# Patient Record
Sex: Female | Born: 1935
Health system: Southern US, Community
[De-identification: ages and names within clinical notes are randomized; demographics above are authoritative.]

## PROBLEM LIST (undated history)

## (undated) DIAGNOSIS — R259 Unspecified abnormal involuntary movements: Secondary | ICD-10-CM

## (undated) DIAGNOSIS — H9190 Unspecified hearing loss, unspecified ear: Secondary | ICD-10-CM

## (undated) DIAGNOSIS — F0390 Unspecified dementia without behavioral disturbance: Secondary | ICD-10-CM

## (undated) DIAGNOSIS — H35342 Macular cyst, hole, or pseudohole, left eye: Secondary | ICD-10-CM

## (undated) DIAGNOSIS — M7989 Other specified soft tissue disorders: Secondary | ICD-10-CM

## (undated) DIAGNOSIS — K219 Gastro-esophageal reflux disease without esophagitis: Secondary | ICD-10-CM

## (undated) DIAGNOSIS — Q438 Other specified congenital malformations of intestine: Secondary | ICD-10-CM

## (undated) DIAGNOSIS — IMO0001 Reserved for inherently not codable concepts without codable children: Secondary | ICD-10-CM

## (undated) DIAGNOSIS — E538 Deficiency of other specified B group vitamins: Secondary | ICD-10-CM

## (undated) DIAGNOSIS — R251 Tremor, unspecified: Secondary | ICD-10-CM

## (undated) DIAGNOSIS — F411 Generalized anxiety disorder: Secondary | ICD-10-CM

## (undated) DIAGNOSIS — Z5189 Encounter for other specified aftercare: Secondary | ICD-10-CM

## (undated) DIAGNOSIS — I499 Cardiac arrhythmia, unspecified: Secondary | ICD-10-CM

## (undated) DIAGNOSIS — E7800 Pure hypercholesterolemia, unspecified: Secondary | ICD-10-CM

## (undated) DIAGNOSIS — K911 Postgastric surgery syndromes: Secondary | ICD-10-CM

## (undated) DIAGNOSIS — K222 Esophageal obstruction: Secondary | ICD-10-CM

## (undated) DIAGNOSIS — K5909 Other constipation: Secondary | ICD-10-CM

## (undated) DIAGNOSIS — M1990 Unspecified osteoarthritis, unspecified site: Secondary | ICD-10-CM

## (undated) DIAGNOSIS — K449 Diaphragmatic hernia without obstruction or gangrene: Secondary | ICD-10-CM

## (undated) DIAGNOSIS — N390 Urinary tract infection, site not specified: Secondary | ICD-10-CM

## (undated) DIAGNOSIS — I10 Essential (primary) hypertension: Secondary | ICD-10-CM

## (undated) DIAGNOSIS — I872 Venous insufficiency (chronic) (peripheral): Secondary | ICD-10-CM

## (undated) DIAGNOSIS — M159 Polyosteoarthritis, unspecified: Secondary | ICD-10-CM

## (undated) DIAGNOSIS — N736 Female pelvic peritoneal adhesions (postinfective): Secondary | ICD-10-CM

## (undated) DIAGNOSIS — D649 Anemia, unspecified: Secondary | ICD-10-CM

## (undated) HISTORY — DX: Generalized anxiety disorder: F41.1

## (undated) HISTORY — DX: Unspecified abnormal involuntary movements: R25.9

## (undated) HISTORY — DX: Unspecified dementia, unspecified severity, without behavioral disturbance, psychotic disturbance, mood disturbance, and anxiety: F03.90

## (undated) HISTORY — DX: Unspecified hearing loss, unspecified ear: H91.90

## (undated) HISTORY — DX: Other specified soft tissue disorders: M79.89

## (undated) HISTORY — DX: Postgastric surgery syndromes: K91.1

## (undated) HISTORY — DX: Deficiency of other specified B group vitamins: E53.8

## (undated) HISTORY — DX: Other constipation: K59.09

## (undated) HISTORY — DX: Venous insufficiency (chronic) (peripheral): I87.2

## (undated) HISTORY — DX: Essential (primary) hypertension: I10

## (undated) HISTORY — DX: Cardiac arrhythmia, unspecified: I49.9

## (undated) HISTORY — DX: Urinary tract infection, site not specified: N39.0

## (undated) HISTORY — DX: Polyosteoarthritis, unspecified: M15.9

## (undated) HISTORY — DX: Encounter for other specified aftercare: Z51.89

## (undated) HISTORY — PX: TOTAL ABDOMINAL HYSTERECTOMY: SHX209

## (undated) HISTORY — PX: COLONOSCOPY: SHX174

## (undated) HISTORY — PX: NISSEN FUNDOPLICATION: SHX2091

## (undated) HISTORY — DX: Anemia, unspecified: D64.9

## (undated) HISTORY — DX: Unspecified osteoarthritis, unspecified site: M19.90

## (undated) HISTORY — PX: UPPER GASTROINTESTINAL ENDOSCOPY: SHX188

## (undated) HISTORY — DX: Pure hypercholesterolemia, unspecified: E78.00

## (undated) HISTORY — DX: Macular cyst, hole, or pseudohole, left eye: H35.342

## (undated) HISTORY — DX: Other specified congenital malformations of intestine: Q43.8

## (undated) HISTORY — DX: Diaphragmatic hernia without obstruction or gangrene: K44.9

## (undated) HISTORY — PX: OTHER SURGICAL HISTORY: SHX169

## (undated) HISTORY — DX: Esophageal obstruction: K22.2

## (undated) HISTORY — DX: Female pelvic peritoneal adhesions (postinfective): N73.6

## (undated) HISTORY — DX: Tremor, unspecified: R25.1

## (undated) HISTORY — DX: Gastro-esophageal reflux disease without esophagitis: K21.9

## (undated) HISTORY — DX: Reserved for inherently not codable concepts without codable children: IMO0001

## (undated) HISTORY — PX: CHOLECYSTECTOMY: SHX55

---

## 1998-05-24 ENCOUNTER — Encounter: Payer: Self-pay | Admitting: Cardiology

## 1998-05-24 ENCOUNTER — Observation Stay (HOSPITAL_COMMUNITY): Admission: AD | Admit: 1998-05-24 | Discharge: 1998-05-25 | Payer: Self-pay | Admitting: Cardiology

## 1999-07-13 ENCOUNTER — Emergency Department (HOSPITAL_COMMUNITY): Admission: RE | Admit: 1999-07-13 | Discharge: 1999-07-13 | Payer: Self-pay | Admitting: Pulmonary Disease

## 1999-07-13 ENCOUNTER — Encounter: Payer: Self-pay | Admitting: Pulmonary Disease

## 1999-08-30 ENCOUNTER — Emergency Department (HOSPITAL_COMMUNITY): Admission: EM | Admit: 1999-08-30 | Discharge: 1999-08-30 | Payer: Self-pay | Admitting: Emergency Medicine

## 2000-11-17 ENCOUNTER — Other Ambulatory Visit: Admission: RE | Admit: 2000-11-17 | Discharge: 2000-11-17 | Payer: Self-pay | Admitting: Obstetrics and Gynecology

## 2001-02-18 HISTORY — PX: ESOPHAGUS SURGERY: SHX626

## 2002-05-27 ENCOUNTER — Ambulatory Visit (HOSPITAL_COMMUNITY): Admission: RE | Admit: 2002-05-27 | Discharge: 2002-05-27 | Payer: Self-pay | Admitting: Gastroenterology

## 2002-05-27 ENCOUNTER — Encounter: Payer: Self-pay | Admitting: Gastroenterology

## 2003-07-20 ENCOUNTER — Ambulatory Visit (HOSPITAL_COMMUNITY): Admission: RE | Admit: 2003-07-20 | Discharge: 2003-07-20 | Payer: Self-pay | Admitting: Pulmonary Disease

## 2003-08-02 ENCOUNTER — Ambulatory Visit (HOSPITAL_COMMUNITY): Admission: RE | Admit: 2003-08-02 | Discharge: 2003-08-02 | Payer: Self-pay | Admitting: Gastroenterology

## 2003-11-15 ENCOUNTER — Ambulatory Visit (HOSPITAL_COMMUNITY): Admission: RE | Admit: 2003-11-15 | Discharge: 2003-11-15 | Payer: Self-pay | Admitting: Gastroenterology

## 2004-01-23 ENCOUNTER — Encounter (INDEPENDENT_AMBULATORY_CARE_PROVIDER_SITE_OTHER): Payer: Self-pay | Admitting: Specialist

## 2004-01-23 ENCOUNTER — Observation Stay (HOSPITAL_COMMUNITY): Admission: RE | Admit: 2004-01-23 | Discharge: 2004-01-26 | Payer: Self-pay | Admitting: General Surgery

## 2004-01-31 ENCOUNTER — Ambulatory Visit: Payer: Self-pay | Admitting: Adult Health

## 2004-03-09 ENCOUNTER — Ambulatory Visit (HOSPITAL_COMMUNITY): Admission: RE | Admit: 2004-03-09 | Discharge: 2004-03-09 | Payer: Self-pay | Admitting: General Surgery

## 2004-03-15 ENCOUNTER — Ambulatory Visit: Payer: Self-pay | Admitting: Pulmonary Disease

## 2004-07-19 ENCOUNTER — Ambulatory Visit: Payer: Self-pay | Admitting: Pulmonary Disease

## 2004-08-15 ENCOUNTER — Ambulatory Visit: Payer: Self-pay | Admitting: Pulmonary Disease

## 2004-11-15 ENCOUNTER — Ambulatory Visit: Payer: Self-pay | Admitting: Pulmonary Disease

## 2004-11-30 ENCOUNTER — Ambulatory Visit: Payer: Self-pay | Admitting: Pulmonary Disease

## 2005-04-23 ENCOUNTER — Encounter: Admission: RE | Admit: 2005-04-23 | Discharge: 2005-04-23 | Payer: Self-pay | Admitting: General Surgery

## 2005-05-16 ENCOUNTER — Ambulatory Visit: Payer: Self-pay | Admitting: Pulmonary Disease

## 2005-06-10 ENCOUNTER — Ambulatory Visit (HOSPITAL_COMMUNITY): Admission: RE | Admit: 2005-06-10 | Discharge: 2005-06-12 | Payer: Self-pay | Admitting: General Surgery

## 2005-06-10 ENCOUNTER — Encounter (INDEPENDENT_AMBULATORY_CARE_PROVIDER_SITE_OTHER): Payer: Self-pay | Admitting: *Deleted

## 2005-11-14 ENCOUNTER — Ambulatory Visit: Payer: Self-pay | Admitting: Pulmonary Disease

## 2005-12-03 ENCOUNTER — Ambulatory Visit: Payer: Self-pay | Admitting: Pulmonary Disease

## 2006-01-23 ENCOUNTER — Ambulatory Visit: Payer: Self-pay | Admitting: Pulmonary Disease

## 2006-01-28 ENCOUNTER — Ambulatory Visit: Payer: Self-pay | Admitting: Gastroenterology

## 2006-03-10 ENCOUNTER — Ambulatory Visit: Payer: Self-pay | Admitting: Pulmonary Disease

## 2006-04-29 ENCOUNTER — Ambulatory Visit: Payer: Self-pay | Admitting: Gastroenterology

## 2006-05-13 ENCOUNTER — Ambulatory Visit: Payer: Self-pay | Admitting: Gastroenterology

## 2006-05-15 ENCOUNTER — Ambulatory Visit: Payer: Self-pay | Admitting: Pulmonary Disease

## 2006-05-15 LAB — CONVERTED CEMR LAB
Albumin: 3.5 g/dL (ref 3.5–5.2)
Alkaline Phosphatase: 88 units/L (ref 39–117)
BUN: 20 mg/dL (ref 6–23)
Basophils Relative: 0.8 % (ref 0.0–1.0)
CO2: 28 meq/L (ref 19–32)
Eosinophils Absolute: 0.2 10*3/uL (ref 0.0–0.6)
GFR calc Af Amer: 77 mL/min
GFR calc non Af Amer: 64 mL/min
Ketones, ur: NEGATIVE mg/dL
Lymphocytes Relative: 24.4 % (ref 12.0–46.0)
MCV: 63.7 fL — ABNORMAL LOW (ref 78.0–100.0)
Monocytes Relative: 9.3 % (ref 3.0–11.0)
Neutro Abs: 4.2 10*3/uL (ref 1.4–7.7)
Platelets: 503 10*3/uL — ABNORMAL HIGH (ref 150–400)
Potassium: 4.7 meq/L (ref 3.5–5.1)
RBC: 3.26 M/uL — ABNORMAL LOW (ref 3.87–5.11)
Saturation Ratios: 4.2 % — ABNORMAL LOW (ref 20.0–50.0)
Specific Gravity, Urine: 1.01 (ref 1.000–1.03)
Total Protein: 6.5 g/dL (ref 6.0–8.3)
Transferrin: 291.7 mg/dL (ref >=212.0)
Urine Glucose: NEGATIVE mg/dL
Urobilinogen, UA: 0.2 (ref 0.0–1.0)
pH: 7 (ref 5.0–8.0)

## 2006-05-16 ENCOUNTER — Ambulatory Visit (HOSPITAL_COMMUNITY): Admission: RE | Admit: 2006-05-16 | Discharge: 2006-05-16 | Payer: Self-pay | Admitting: Pulmonary Disease

## 2006-05-19 ENCOUNTER — Ambulatory Visit: Payer: Self-pay | Admitting: Gastroenterology

## 2006-05-20 ENCOUNTER — Ambulatory Visit (HOSPITAL_COMMUNITY): Admission: RE | Admit: 2006-05-20 | Discharge: 2006-05-20 | Payer: Self-pay | Admitting: Gastroenterology

## 2006-05-30 ENCOUNTER — Ambulatory Visit: Payer: Self-pay | Admitting: Pulmonary Disease

## 2006-05-30 LAB — CONVERTED CEMR LAB
Basophils Absolute: 0.1 10*3/uL (ref 0.0–0.1)
HCT: 26.6 % — ABNORMAL LOW (ref 36.0–46.0)
Iron: 35 ug/dL — ABNORMAL LOW (ref 42–145)
MCHC: 33.1 g/dL (ref 30.0–36.0)
Neutrophils Relative %: 61.9 % (ref 43.0–77.0)
RBC: 3.83 M/uL — ABNORMAL LOW (ref 3.87–5.11)
RDW: 23 % — ABNORMAL HIGH (ref 11.5–14.6)
Saturation Ratios: 9.4 % — ABNORMAL LOW (ref 20.0–50.0)

## 2006-06-05 ENCOUNTER — Ambulatory Visit: Payer: Self-pay | Admitting: Internal Medicine

## 2006-06-12 ENCOUNTER — Ambulatory Visit: Payer: Self-pay | Admitting: Pulmonary Disease

## 2006-06-19 ENCOUNTER — Ambulatory Visit: Payer: Self-pay | Admitting: Pulmonary Disease

## 2006-06-19 LAB — CONVERTED CEMR LAB
Basophils Relative: 0.9 % (ref 0.0–1.0)
Eosinophils Relative: 2.4 % (ref 0.0–5.0)
Lymphocytes Relative: 28.7 % (ref 12.0–46.0)
Platelets: 328 10*3/uL (ref 150–400)
RBC: 4.15 M/uL (ref 3.87–5.11)
RDW: 28.3 % — ABNORMAL HIGH (ref 11.5–14.6)
Saturation Ratios: 8.8 % — ABNORMAL LOW (ref 20.0–50.0)
Transferrin: 252.1 mg/dL (ref >=212.0)
WBC: 5.3 10*3/uL (ref 4.5–10.5)

## 2006-07-24 ENCOUNTER — Ambulatory Visit: Payer: Self-pay | Admitting: Pulmonary Disease

## 2006-07-24 LAB — CONVERTED CEMR LAB
Basophils Absolute: 0.1 K/uL
Basophils Relative: 0.9 %
Eosinophils Absolute: 0.2 K/uL
Eosinophils Relative: 2.5 %
HCT: 33.1 % — ABNORMAL LOW
Hemoglobin: 11.7 g/dL — ABNORMAL LOW
Iron: 112 ug/dL
Lymphocytes Relative: 24.6 %
MCHC: 35.2 g/dL
MCV: 79.4 fL
Monocytes Absolute: 0.5 K/uL
Monocytes Relative: 9.1 %
Neutro Abs: 3.7 K/uL
Neutrophils Relative %: 62.9 %
Platelets: 321 K/uL
RBC: 4.17 M/uL
RDW: 27.4 % — ABNORMAL HIGH
Saturation Ratios: 33.3 %
Transferrin: 240.3 mg/dL
WBC: 6 10*3/microliter

## 2006-09-10 ENCOUNTER — Ambulatory Visit: Payer: Self-pay | Admitting: Pulmonary Disease

## 2006-10-21 ENCOUNTER — Ambulatory Visit: Payer: Self-pay | Admitting: Pulmonary Disease

## 2006-10-21 LAB — CONVERTED CEMR LAB
ALT: 21 units/L (ref 0–35)
AST: 23 units/L (ref 0–37)
Basophils Relative: 0.9 % (ref 0.0–1.0)
Bilirubin, Direct: 0.1 mg/dL (ref 0.0–0.3)
CO2: 32 meq/L (ref 19–32)
Chloride: 101 meq/L (ref 96–112)
Eosinophils Absolute: 0.1 10*3/uL (ref 0.0–0.6)
Eosinophils Relative: 1.5 % (ref 0.0–5.0)
GFR calc non Af Amer: 86 mL/min
Glucose, Bld: 94 mg/dL (ref 70–99)
HCT: 35.2 % — ABNORMAL LOW (ref 36.0–46.0)
Iron: 49 ug/dL (ref 42–145)
Lymphocytes Relative: 23.2 % (ref 12.0–46.0)
MCV: 91.5 fL (ref 78.0–100.0)
Neutrophils Relative %: 66.1 % (ref 43.0–77.0)
RBC: 3.84 M/uL — ABNORMAL LOW (ref 3.87–5.11)
Sodium: 140 meq/L (ref 135–145)
TSH: 1.89 microintl units/mL (ref 0.35–5.50)
Total Bilirubin: 0.7 mg/dL (ref 0.3–1.2)
Total Protein: 6.7 g/dL (ref 6.0–8.3)
WBC: 7.2 10*3/uL (ref 4.5–10.5)

## 2006-11-13 ENCOUNTER — Ambulatory Visit: Payer: Self-pay | Admitting: Pulmonary Disease

## 2006-12-18 DIAGNOSIS — K449 Diaphragmatic hernia without obstruction or gangrene: Secondary | ICD-10-CM | POA: Insufficient documentation

## 2006-12-18 DIAGNOSIS — D649 Anemia, unspecified: Secondary | ICD-10-CM | POA: Insufficient documentation

## 2006-12-18 DIAGNOSIS — M159 Polyosteoarthritis, unspecified: Secondary | ICD-10-CM | POA: Insufficient documentation

## 2006-12-18 DIAGNOSIS — F411 Generalized anxiety disorder: Secondary | ICD-10-CM | POA: Insufficient documentation

## 2007-02-23 ENCOUNTER — Telehealth (INDEPENDENT_AMBULATORY_CARE_PROVIDER_SITE_OTHER): Payer: Self-pay | Admitting: *Deleted

## 2007-05-14 ENCOUNTER — Ambulatory Visit: Payer: Self-pay | Admitting: Pulmonary Disease

## 2007-05-14 ENCOUNTER — Ambulatory Visit: Payer: Self-pay | Admitting: Internal Medicine

## 2007-05-14 DIAGNOSIS — I10 Essential (primary) hypertension: Secondary | ICD-10-CM | POA: Insufficient documentation

## 2007-05-14 DIAGNOSIS — N390 Urinary tract infection, site not specified: Secondary | ICD-10-CM

## 2007-05-14 DIAGNOSIS — I499 Cardiac arrhythmia, unspecified: Secondary | ICD-10-CM

## 2007-05-14 DIAGNOSIS — K5909 Other constipation: Secondary | ICD-10-CM

## 2007-05-14 DIAGNOSIS — M810 Age-related osteoporosis without current pathological fracture: Secondary | ICD-10-CM

## 2007-05-14 DIAGNOSIS — G250 Essential tremor: Secondary | ICD-10-CM | POA: Insufficient documentation

## 2007-06-04 ENCOUNTER — Encounter: Payer: Self-pay | Admitting: Pulmonary Disease

## 2007-11-12 ENCOUNTER — Ambulatory Visit: Payer: Self-pay | Admitting: Pulmonary Disease

## 2007-11-14 DIAGNOSIS — I872 Venous insufficiency (chronic) (peripheral): Secondary | ICD-10-CM

## 2008-02-23 ENCOUNTER — Telehealth (INDEPENDENT_AMBULATORY_CARE_PROVIDER_SITE_OTHER): Payer: Self-pay | Admitting: *Deleted

## 2008-03-04 ENCOUNTER — Telehealth (INDEPENDENT_AMBULATORY_CARE_PROVIDER_SITE_OTHER): Payer: Self-pay | Admitting: *Deleted

## 2008-04-13 ENCOUNTER — Telehealth: Payer: Self-pay | Admitting: Pulmonary Disease

## 2008-05-12 ENCOUNTER — Ambulatory Visit: Payer: Self-pay | Admitting: Pulmonary Disease

## 2008-05-14 LAB — CONVERTED CEMR LAB
AST: 24 units/L (ref 0–37)
Albumin: 3.6 g/dL (ref 3.5–5.2)
Alkaline Phosphatase: 79 units/L (ref 39–117)
Basophils Absolute: 0.1 10*3/uL (ref 0.0–0.1)
Basophils Relative: 0.8 % (ref 0.0–3.0)
CO2: 29 meq/L (ref 19–32)
Calcium: 9.3 mg/dL (ref 8.4–10.5)
GFR calc non Af Amer: 101.62 mL/min (ref >60)
Glucose, Bld: 102 mg/dL — ABNORMAL HIGH (ref 70–99)
HCT: 37.6 % (ref 36.0–46.0)
Hemoglobin: 13.1 g/dL (ref 12.0–15.0)
Lymphocytes Relative: 19.5 % (ref 12.0–46.0)
Lymphs Abs: 1.4 10*3/uL (ref 0.7–4.0)
MCHC: 34.7 g/dL (ref 30.0–36.0)
Monocytes Relative: 8.3 % (ref 3.0–12.0)
Neutro Abs: 5.2 10*3/uL (ref 1.4–7.7)
Potassium: 3.9 meq/L (ref 3.5–5.1)
RBC: 3.91 M/uL (ref 3.87–5.11)
RDW: 12.5 % (ref 11.5–14.6)
Sodium: 140 meq/L (ref 135–145)
TSH: 1.24 microintl units/mL (ref 0.35–5.50)
Total CHOL/HDL Ratio: 4
Total Protein: 6.6 g/dL (ref 6.0–8.3)
Triglycerides: 116 mg/dL (ref 0.0–149.0)

## 2008-05-18 ENCOUNTER — Telehealth (INDEPENDENT_AMBULATORY_CARE_PROVIDER_SITE_OTHER): Payer: Self-pay | Admitting: *Deleted

## 2008-11-17 ENCOUNTER — Ambulatory Visit: Payer: Self-pay | Admitting: Pulmonary Disease

## 2008-11-18 ENCOUNTER — Encounter: Payer: Self-pay | Admitting: Pulmonary Disease

## 2008-11-19 DIAGNOSIS — E7800 Pure hypercholesterolemia, unspecified: Secondary | ICD-10-CM | POA: Insufficient documentation

## 2009-03-22 ENCOUNTER — Telehealth (INDEPENDENT_AMBULATORY_CARE_PROVIDER_SITE_OTHER): Payer: Self-pay | Admitting: *Deleted

## 2009-05-18 ENCOUNTER — Ambulatory Visit: Payer: Self-pay | Admitting: Pulmonary Disease

## 2009-05-19 ENCOUNTER — Ambulatory Visit: Payer: Self-pay | Admitting: Pulmonary Disease

## 2009-05-20 LAB — CONVERTED CEMR LAB
Alkaline Phosphatase: 88 units/L (ref 39–117)
BUN: 14 mg/dL (ref 6–23)
Basophils Relative: 0.6 % (ref 0.0–3.0)
Bilirubin, Direct: 0.1 mg/dL (ref 0.0–0.3)
CO2: 32 meq/L (ref 19–32)
Chloride: 103 meq/L (ref 96–112)
Eosinophils Absolute: 0.2 10*3/uL (ref 0.0–0.7)
Glucose, Bld: 86 mg/dL (ref 70–99)
HCT: 37.9 % (ref 36.0–46.0)
HDL: 48 mg/dL (ref >39.00)
Hemoglobin: 13.2 g/dL (ref 12.0–15.0)
Lymphocytes Relative: 24.6 % (ref 12.0–46.0)
Lymphs Abs: 1.5 10*3/uL (ref 0.7–4.0)
MCHC: 34.9 g/dL (ref 30.0–36.0)
MCV: 97.7 fL (ref 78.0–100.0)
Neutro Abs: 3.9 10*3/uL (ref 1.4–7.7)
Potassium: 3.7 meq/L (ref 3.5–5.1)
RBC: 3.88 M/uL (ref 3.87–5.11)
Sodium: 143 meq/L (ref 135–145)
Total Bilirubin: 0.6 mg/dL (ref 0.3–1.2)
Total CHOL/HDL Ratio: 4
Total Protein: 6.8 g/dL (ref 6.0–8.3)
VLDL: 23.4 mg/dL (ref 0.0–40.0)
Vit D, 25-Hydroxy: 24 ng/mL — ABNORMAL LOW (ref 30–89)

## 2009-07-21 ENCOUNTER — Ambulatory Visit: Payer: Self-pay | Admitting: Internal Medicine

## 2009-07-21 ENCOUNTER — Encounter: Payer: Self-pay | Admitting: Pulmonary Disease

## 2009-11-15 ENCOUNTER — Ambulatory Visit: Payer: Self-pay | Admitting: Pulmonary Disease

## 2009-11-24 ENCOUNTER — Encounter: Payer: Self-pay | Admitting: Pulmonary Disease

## 2010-01-17 ENCOUNTER — Encounter: Payer: Self-pay | Admitting: Pulmonary Disease

## 2010-01-18 ENCOUNTER — Encounter: Admission: RE | Admit: 2010-01-18 | Discharge: 2010-01-18 | Payer: Self-pay | Admitting: Orthopedic Surgery

## 2010-01-26 ENCOUNTER — Telehealth: Payer: Self-pay | Admitting: Pulmonary Disease

## 2010-01-29 ENCOUNTER — Telehealth: Payer: Self-pay | Admitting: Pulmonary Disease

## 2010-03-18 LAB — CONVERTED CEMR LAB
Basophils Absolute: 0.1 10*3/uL (ref 0.0–0.1)
Eosinophils Absolute: 0.1 10*3/uL (ref 0.0–0.6)
HCT: 38.2 % (ref 36.0–46.0)
Hemoglobin: 12.9 g/dL (ref 12.0–15.0)
Iron: 80 ug/dL (ref 42–145)
MCHC: 33.7 g/dL (ref 30.0–36.0)
MCV: 94.1 fL (ref 78.0–100.0)
Monocytes Absolute: 0.6 10*3/uL (ref 0.2–0.7)
Neutro Abs: 4 10*3/uL (ref 1.4–7.7)
Platelets: 312 10*3/uL (ref 150–400)
RDW: 12.6 % (ref 11.5–14.6)
Transferrin: 200.3 mg/dL — ABNORMAL LOW (ref >=212.0)

## 2010-03-22 NOTE — Progress Notes (Signed)
Summary: boniva changedt o alendronate  Phone Note Call from Patient Call back at Home Phone [phone]   Caller: Patient Call For: nadel Summary of Call: Pt wants to know if alendronate sodium is replacing another of her meds.//[facility] Initial call taken by: Darletta Moll,  January 29, 2010 9:28 AM  Follow-up for Phone Call        advised pt that it is taking place of the boniva. pt states understanding. Carron Curie CMA  January 29, 2010 11:22 AM

## 2010-03-22 NOTE — Progress Notes (Signed)
Summary: rx  Phone Note Call from Patient Call back at Home Phone [phone]   Caller: Patient Call For: nadel Reason for Call: Refill Medication Summary of Call: pt needs refill on [place] called in to [facility] Initial call taken by: Eugene Gavia,  March 22, 2009 3:25 PM  Follow-up for Phone Call        called spoke with patient, informed her that her med has been sent to her pharmacy of choice.  pt verbalized her understanding. Boone Master CNA  March 22, 2009 3:44 PM     Prescriptions: FERROUS SULFATE 324 MG TBEC (FERROUS SULFATE) take 1 tab by mouth once daily...  #30 x 6   Entered by:   Boone Master CNA   Authorized by:   Michele Mcalpine MD   Signed by:   Boone Master CNA on 03/22/2009   Method used:   Electronically to        [facility] (retail)       [address]       [place], [place]  [address]       Ph: [phone]       Fax: [phone]   RxID:   [id number]

## 2010-03-22 NOTE — Assessment & Plan Note (Signed)
Summary: 6 months/apc   CC:  6 month ROV & review of mult medical problems....  History of Present Illness: 77 y/o WF here for a follow up visit... she has mult med problems as noted below... 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 she states that she has been doing well without any new complaints or concerns...   ~  November 17, 2008:  she's had a stable 78mo but had some hip & knee pain- saw DrMortenson w/ shot given...  "I use linament"... OK Flu shot today.   ~  May 18, 2009:  continues to do well... didn't bring meds to visit today- reviewed w/ pt, same no change... BP controlled on meds; Lipids remain elevated on diet alone but she declines statin Rx;  otherw stable...    Current Problem List:  HYPERTENSION (ICD-401.9) - controlled on TOPROL XL 50- 1/2 tab daily, HYZAAR 50/d, and DILTIAZEM 180mg /d... BP = 128/70 and she is doing well, takes meds regularly and tol well... denies HA, visual changes, CP, palipit, dizziness, syncope, dyspnea, edema, etc...  Hx of CARDIAC ARRHYTHMIA (ICD-427.9) - hx VTach in past, it was exercise induced... eval by DrBBrodie prev- normal cath... stable on meds... last seen 9/05 for pre-op clearance...  ~  NuclearStressTest 9/05 showed no scar or ischemia, EF=70%, some mild ectopy...  VENOUS INSUFFICIENCY (ICD-459.81) - she has mild intermittent edema- treated w/ low sodium diet, elevation, support hose...  HYPERCHOLESTEROLEMIA, MILD (ICD-272.0) - on diet alone...  ~  FLP 3/10 showed TChol 204, TG 116, HDL 46, LDL 134... she prefers diet Rx.  ~  FLP 4/11 showed TChol 206, TG 117, HDL 48, LDL 142... offered meds, she prefers diet.  HIATAL HERNIA (ICD-553.3) - on OMEPRAZOLE 20mg Bid... doing well w/ intermittent reflux symptoms, but no dysphagia, etc... she is s/p nissen fundoplication for a large HH...  ~  last EGD 3/08 showed prev HH surg, mild  stricture, some gastritis...  CONSTIPATION, CHRONIC (ICD-564.09) - on Miralax, Senakot-S...  ~  last colonoscopy 9/00 showed marked tortuous redundant colon, ? adhesions...   Hx of URINARY TRACT INFECTION (ICD-599.0) - she has some bladder symptoms- going q2-3H during the night, but is apparently OK during the day... she refuses urology referral & doesn't want medication trial...  DEGENERATIVE JOINT DISEASE, GENERALIZED (ICD-715.00) - on TRAMADOL 50mg  Prn (using about 2/d)... LBP eval by DrMortenson w/ shot in her knee, she says...   OSTEOPOROSIS (ICD-733.00) - prev on Fosamax but pt DC'd this w/ prev GIB... BMD 3/09 showed TScores -1.6 in Spine, -2.4 in [facility]... started BONIVA 150mg /mo and tolerating this satis- just c/o $$$ in the donut hole... also rec to take CALTRATE Bid and MVI...  ~  labs 3/10 showed Vit D = 28... rec OTC Vit D supplement 1000 u daily.  ~  labs 3/11 showed Vit D = 24... rec incr to 2000 u vit D daily...  TREMOR (ICD-781.0) - she has head titubations on PRIMADONE 50mg Tid...  ANXIETY (ICD-300.00)  ANEMIA (ICD-285.9) - she had an Fe defic anemia in 2008 w/ GI eval by DrGessner- neg EGD, neg colon, capsule endoscopy without lesions seen... she takes REPLIVA daily, but insurance won't pay- therefore change to OTC Fe supplement...   ~  labs 3/09 showed Hg= 12.9, and FE= 80...  ~  labs 3/10 showed Hg= 13.1, Fe= 52... rec> OTC FeSO4 supplement...  ~  labs 3/11 showed Hg=  13.2   Allergies (verified): No Known Drug Allergies  Comments:  Nurse/Medical Assistant: The patient's medications and allergies were reviewed with the patient and were updated in the Medication and Allergy Lists.  Past History:  Past Medical History:  HYPERTENSION (ICD-401.9) Hx of CARDIAC ARRHYTHMIA (ICD-427.9) VENOUS INSUFFICIENCY (ICD-459.81) HYPERCHOLESTEROLEMIA, MILD (ICD-272.0) HIATAL HERNIA (ICD-553.3) CONSTIPATION, CHRONIC (ICD-564.09) Hx of URINARY TRACT INFECTION  (ICD-599.0) DEGENERATIVE JOINT DISEASE, GENERALIZED (ICD-715.00) OSTEOPOROSIS (ICD-733.00) TREMOR (ICD-781.0) ANXIETY (ICD-300.00) ANEMIA (ICD-285.9)  Past Surgical History: S/P nissen fundoplication for large HH S/P cholecystectomy  Family History: Reviewed history from 11/12/2007 and no changes required. Father died at age 27 from unknown causes Mother died age 47 w/ cancer  4 Siblings: 1 died age 84, 3 others a/w  Social History: Reviewed history from 11/12/2007 and no changes required. Widow 3 Children Never smoker No Etoh  Review of Systems      See HPI       The patient complains of decreased hearing, dyspnea on exertion, and difficulty walking.  The patient denies anorexia, fever, weight loss, weight gain, vision loss, hoarseness, chest pain, syncope, peripheral edema, prolonged cough, headaches, hemoptysis, abdominal pain, melena, hematochezia, severe indigestion/heartburn, hematuria, incontinence, muscle weakness, suspicious skin lesions, transient blindness, depression, unusual weight change, abnormal bleeding, enlarged lymph nodes, and angioedema.    Vital Signs:  Patient profile:   77 year old female Height:      57 inches Weight:      152.25 pounds O2 Sat:      98 % on Room air Temp:     97.3 degrees F oral Pulse rate:   68 / minute BP sitting:   128 / 70  (left arm) Cuff size:   regular  Vitals Entered By: Randell Loop CMA (May 18, 2009 9:43 AM)  O2 Sat at Rest %:  98 O2 Flow:  Room air CC: 6 month ROV & review of mult medical problems... Is Patient Diabetic? No Pain Assessment Patient in pain? no      Comments no changes in meds today   Physical Exam  Additional Exam:  WD, WN, 77 y/o WF in NAD... GENERAL:  Alert & oriented; pleasant & cooperative... HEENT:  [place]/AT, EOM-wnl, PERRLA, EACs-clear, TMs-wnl, NOSE-clear, THROAT-clear & wnl. NECK:  Supple w/ fairROM; no JVD; normal carotid impulses w/o bruits; no thyromegaly or nodules palpated; no  lymphadenopathy. CHEST:  Clear to P & A; without wheezes/ rales/ or rhonchi. HEART:  Regular Rhythm; without murmurs/ rubs/ or gallops. ABDOMEN:  Soft & nontender; normal bowel sounds; no organomegaly or masses detected. EXT: without deformities, mod arthritic changes; no varicose veins/ +venous insuffic/ no edema. NEURO:  CN's intact;  no focal neuro deficits... DERM:  No lesions noted; no rash etc...    MISC. Report  Procedure date:  05/19/2009  Findings:      BMP (METABOL)   Sodium                    143 mEq/L                   135-145   Potassium                 3.7 mEq/L                   3.5-5.1   Chloride                  103 mEq/L  96-112   Carbon Dioxide            32 mEq/L                    19-32   Glucose                   86 mg/dL                    16-10   BUN                       14 mg/dL                    9-60   Creatinine                0.7 mg/dL                   4.5-4.0   Calcium                   9.3 mg/dL                   9.8-11.9   GFR                       84.84 mL/min                >60   Lipid Panel (LIPID)   Cholesterol          [H]  206 mg/dL                   1-478   Triglycerides             117.0 mg/dL                 2.9-562.1   HDL                       30.86 mg/dL                 >57.84 Cholesterol LDL - Direct                             142.0 mg/dL   Hepatic/Liver Function Panel (HEPATIC)   Total Bilirubin           0.6 mg/dL                   6.9-6.2   Direct Bilirubin          0.1 mg/dL                   9.5-2.8   Alkaline Phosphatase      88 U/L                      39-117   AST                       21 U/L                      0-37   ALT                       18 U/L  0-35   Total Protein             6.8 g/dL                    5.9-5.6   Albumin                   3.8 g/dL                    3.8-7.5  Comments:      CBC Platelet w/Diff (CBCD)   White Cell Count          6.0 K/uL                     4.5-10.5   Red Cell Count            3.88 Mil/uL                 3.87-5.11   Hemoglobin                13.2 g/dL                   64.3-32.9   Hematocrit                37.9 %                      36.0-46.0   MCV                       97.7 fl                     78.0-100.0   Platelet Count            286.0 K/uL                  150.0-400.0   Neutrophil %              64.3 %                      43.0-77.0   Lymphocyte %              24.6 %                      12.0-46.0   Monocyte %                7.5 %                       3.0-12.0   Eosinophils%              3.0 %                       0.0-5.0   Basophils %               0.6 %                       0.0-3.0  TSH (TSH)   FastTSH                   1.20 uIU/mL                 0.35-5.50  Vitamin D (25-Hydroxy) (51884)  Vitamin D (25-Hydroxy)                        [  L]  24 ng/mL                    30-89   Impression & Recommendations:  Problem # 1:  HYPERTENSION (ICD-401.9) Controlled-  same meds. Her updated medication list for this problem includes:    Toprol Xl 50 Mg Tb24 (Metoprolol succinate) .Marland Kitchen... Take one half of a tablet by mouth once daily    Diltiazem Hcl Cr 180 Mg Cp24 (Diltiazem hcl) .Marland Kitchen... Take 1 capsule by mouth once a day    Hyzaar 50-12.5 Mg Tabs (Losartan potassium-hctz) .Marland Kitchen... Take 1 tablet by mouth once a day  Problem # 2:  HYPERCHOLESTEROLEMIA, MILD (ICD-272.0) She is a candidate for Statin rx-  but declines and wants to continue diet alone...  Problem # 3:  HIATAL HERNIA (ICD-553.3) GI is stable-  continue current meds... Her updated medication list for this problem includes:    Omeprazole 20 Mg Tbec (Omeprazole) .Marland Kitchen... Take 1 tab by mouth two times a day - 30 min before breakfast and dinner...  Problem # 4:  DEGENERATIVE JOINT DISEASE, GENERALIZED (ICD-715.00) She will continue to remain active & Rx w/ Tramadol, Linament... Her updated medication list for this problem includes:    Tramadol Hcl 50 Mg Tabs  (Tramadol hcl) .Marland Kitchen... Take 1 tab by mouth three times a day as needed for pain...  Problem # 5:  OSTEOPOROSIS (ICD-733.00) Continue Boniva, Calcium, Vit D... Her updated medication list for this problem includes:    Boniva 150 Mg Tabs (Ibandronate sodium) .Marland Kitchen... Take one tablet by mouth every month  Problem # 6:  ANEMIA (ICD-285.9) Stable Hg... Her updated medication list for this problem includes:    Ferrous Sulfate 324 Mg Tbec (Ferrous sulfate) .Marland Kitchen... Take 1 tab by mouth once daily...  Problem # 7:  OTHER MEDICAL PROBLEMS AS NOTED>>>  Complete Medication List: 1)  Toprol Xl 50 Mg Tb24 (Metoprolol succinate) .... Take one half of a tablet by mouth once daily 2)  Diltiazem Hcl Cr 180 Mg Cp24 (Diltiazem hcl) .... Take 1 capsule by mouth once a day 3)  Hyzaar 50-12.5 Mg Tabs (Losartan potassium-hctz) .... Take 1 tablet by mouth once a day 4)  Omeprazole 20 Mg Tbec (Omeprazole) .... Take 1 tab by mouth two times a day - 30 min before breakfast and dinner... 5)  Boniva 150 Mg Tabs (Ibandronate sodium) .... Take one tablet by mouth every month 6)  Calcium Carbonate-vitamin D 600-400 Mg-unit Tabs (Calcium carbonate-vitamin d) .... Take 1 tablet by mouth once a day 7)  Multivitamins Tabs (Multiple vitamin) .Marland Kitchen.. 1 tab daily.Marland KitchenMarland Kitchen 8)  Vitamin D3 400 Unit Tabs (Cholecalciferol) .... Take 1 tablet by mouth once a day 9)  Tramadol Hcl 50 Mg Tabs (Tramadol hcl) .... Take 1 tab by mouth three times a day as needed for pain... 10)  Primidone 50 Mg Tabs (Primidone) .... Take 3 tablets by mouth once daily 11)  Ferrous Sulfate 324 Mg Tbec (Ferrous sulfate) .... Take 1 tab by mouth once daily...  Other Orders: Prescription Created Electronically [id number])  Patient Instructions: 1)  Today we updated your med list- see below.... 2)  We refilled the meds you requested...  3)  Please return to our lab in the AM for your FASTING blood work... 4)  please call the "phone tree" in a few days for your lab  results.Marland KitchenMarland Kitchen 5)  Stay as active as you are...  6)  Call for any problems.Marland KitchenMarland Kitchen 7)  Please schedule a follow-up appointment in  6 months. Prescriptions: PRIMIDONE 50 MG  TABS (PRIMIDONE) Take 3 tablets by mouth once daily  #90 x prn   Entered and Authorized by:   Michele Mcalpine MD   Signed by:   Michele Mcalpine MD on 05/18/2009   Method used:   Print then Give to Patient   RxID:   [id number] TRAMADOL HCL 50 MG TABS (TRAMADOL HCL) take 1 tab by mouth three times a day as needed for pain...  #90 x prn   Entered and Authorized by:   Michele Mcalpine MD   Signed by:   Michele Mcalpine MD on 05/18/2009   Method used:   Print then Give to Patient   RxID:   [id number]

## 2010-03-22 NOTE — Assessment & Plan Note (Signed)
Summary: 6 months/apc   CC:  6 month ROV & review of mult medical problems....  History of Present Illness: 77 y/o WF here for a follow up visit... she has mult med problems as noted below... 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    ~  November 17, 2008:  she's had a stable 78mo but had some hip & knee pain- saw DrMortenson w/ shot given...  "I use linament"... OK Flu shot today.   ~  May 18, 2009:  continues to do well... didn't bring meds to visit today- reviewed w/ pt, same no change... BP controlled on meds; Lipids remain elevated on diet alone but she declines statin Rx;  otherw stable...   ~  November 15, 2009:  overall doing well & still working... she notes "growth" on neck near suprasternal notch- exam shows prom sternoclavic heads bilat, sl tender, & ?cyst in soft tissue (thyroid is normal)... she also notes a sore in her right nares that continues to bother her & we discussed ENT referral to get this checked out... BP remains controlled, denies CP/ palpit/ ch in SOB/ etc;  she continues on the Boniva, calcium, MVI, & Vit D... takes Tramadol for pain & Primadone for tremor... she brought meds today & reviewed w/ pt- OK refills & Flu shot today.    Current Problem List:  HYPERTENSION (ICD-401.9) - controlled on TOPROL XL 50- 1/2 tab daily, HYZAAR 50/d, and DILTIAZEM 180mg /d... BP = 124/66 and she is doing well, takes meds regularly and tol well... denies HA, visual changes, CP, palipit, dizziness, syncope, dyspnea, edema, etc...  Hx of CARDIAC ARRHYTHMIA (ICD-427.9) - hx VTach in past, it was exercise induced... eval by DrBBrodie prev- normal cath... stable on meds... last seen 9/05 for pre-op clearance...  ~  NuclearStressTest 9/05 showed no scar or ischemia, EF=70%, some mild ectopy...  VENOUS INSUFFICIENCY (ICD-459.81) - she has mild intermittent edema- treated w/ low  sodium diet, elevation, support hose...  HYPERCHOLESTEROLEMIA, MILD (ICD-272.0) - on diet alone...  ~  FLP 3/10 showed TChol 204, TG 116, HDL 46, LDL 134... she prefers diet Rx.  ~  FLP 4/11 showed TChol 206, TG 117, HDL 48, LDL 142... offered meds, she prefers diet.  HIATAL HERNIA (ICD-553.3) - on OMEPRAZOLE 20mg Bid... doing well w/ intermittent reflux symptoms, but no dysphagia, etc... she is s/p nissen fundoplication for a large HH...  ~  last EGD 3/08 showed prev HH surg, mild stricture, some gastritis...  CONSTIPATION, CHRONIC (ICD-564.09) - on Miralax, & Senakot-S Prn for constip...  ~  last colonoscopy 9/00 showed marked tortuous redundant colon, ? adhesions...   Hx of URINARY TRACT INFECTION (ICD-599.0) - she has some bladder symptoms- going q2-3H during the night, but is apparently OK during the day... she refuses urology referral & doesn't want medication trial...  DEGENERATIVE JOINT DISEASE, GENERALIZED (ICD-715.00) - on TRAMADOL 50mg  Prn (using about 2/d)... LBP eval by DrMortenson w/ shot in her knee, she says...   OSTEOPOROSIS (ICD-733.00) - prev on Fosamax but pt DC'd this w/ prev GIB... BMD 3/09 showed TScores -1.6 in Spine, -2.4 in [facility]... started BONIVA 150mg /mo and tolerating this satis- just c/o $$$ in the donut hole... also rec to take CALTRATE Bid and MVI...  ~  labs 3/10 showed Vit D = 28... rec OTC Vit D supplement 1000 u daily.  ~  labs 3/11 showed Vit D =  24... rec incr to 2000 u vit D daily...  TREMOR (ICD-781.0) - she has head titubations on PRIMADONE 50mg Tid...  ANXIETY (ICD-300.00)  ANEMIA (ICD-285.9) - she had an Fe defic anemia in 2008 w/ GI eval by DrGessner- neg EGD, neg colon, capsule endoscopy without lesions seen... she takes REPLIVA daily, but insurance won't pay- therefore change to OTC Fe supplement...   ~  labs 3/09 showed Hg= 12.9, and FE= 80...  ~  labs 3/10 showed Hg= 13.1, Fe= 52... rec> OTC FeSO4 supplement...  ~  labs 3/11 showed Hg=  13.2   Preventive Screening-Counseling & Management  Alcohol-Tobacco     Smoking Status: never  Allergies (verified): No Known Drug Allergies  Comments:  Nurse/Medical Assistant: The patient's medications and allergies were reviewed with the patient and were updated in the Medication and Allergy Lists.  Past History:  Past Medical History: HYPERTENSION (ICD-401.9) Hx of CARDIAC ARRHYTHMIA (ICD-427.9) VENOUS INSUFFICIENCY (ICD-459.81) HYPERCHOLESTEROLEMIA, MILD (ICD-272.0) HIATAL HERNIA (ICD-553.3) CONSTIPATION, CHRONIC (ICD-564.09) Hx of URINARY TRACT INFECTION (ICD-599.0) DEGENERATIVE JOINT DISEASE, GENERALIZED (ICD-715.00) OSTEOPOROSIS (ICD-733.00) TREMOR (ICD-781.0) ANXIETY (ICD-300.00) ANEMIA (ICD-285.9)  Past Surgical History: S/P nissen fundoplication for large HH S/P cholecystectomy  Family History: Reviewed history from 11/12/2007 and no changes required. Father died at age 14 from unknown causes Mother died age 48 w/ cancer  4 Siblings: 1 died age 54, 3 others a/w  Social History: Reviewed history from 11/12/2007 and no changes required. Widow 3 Children Never smoker No Etoh  Review of Systems      See HPI       The patient complains of decreased hearing and dyspnea on exertion.  The patient denies anorexia, fever, weight loss, weight gain, vision loss, hoarseness, chest pain, syncope, peripheral edema, prolonged cough, headaches, hemoptysis, abdominal pain, melena, hematochezia, severe indigestion/heartburn, hematuria, incontinence, muscle weakness, suspicious skin lesions, transient blindness, difficulty walking, depression, unusual weight change, abnormal bleeding, enlarged lymph nodes, and angioedema.    Vital Signs:  Patient profile:   77 year old female Height:      57 inches Weight:      150 pounds BMI:     32.58 O2 Sat:      94 % on Room air Temp:     97.7 degrees F oral Pulse rate:   67 / minute BP sitting:   124 / 66  (right arm) Cuff  size:   regular  Vitals Entered By: Randell Loop CMA (November 15, 2009 2:26 PM)  O2 Sat at Rest %:  94 O2 Flow:  Room air CC: 6 month ROV & review of mult medical problems... Is Patient Diabetic? No Pain Assessment Patient in pain? yes      Onset of pain  sometimes with her hand pain Comments meds updated today with pt---she brought all of her meds in today   Physical Exam  Additional Exam:  WD, WN, 77 y/o WF in NAD... GENERAL:  Alert & oriented; pleasant & cooperative... HEENT:  [place]/AT, EOM-wnl, PERRLA, EACs-clear, TMs-wnl, NOSE-clear, THROAT-clear & wnl. NECK:  Supple w/ fairROM; no JVD; normal carotid impulses w/o bruits; no thyromegaly or nodules palpated; no lymphadenopathy. CHEST:  Clear to P & A; without wheezes/ rales/ or rhonchi. HEART:  Regular Rhythm; without murmurs/ rubs/ or gallops. ABDOMEN:  Soft & nontender; normal bowel sounds; no organomegaly or masses detected. EXT: without deformities, mod arthritic changes; no varicose veins/ +venous insuffic/ no edema. NEURO:  CN's intact;  no focal neuro deficits... DERM:  No lesions noted; no rash etc..Marland Kitchen  Impression & Recommendations:  Problem # 1:  HYPERTENSION (ICD-401.9) Controlled>  same meds. Her updated medication list for this problem includes:    Toprol Xl 50 Mg Tb24 (Metoprolol succinate) .Marland Kitchen... Take one half of a tablet by mouth once daily    Diltiazem Hcl Cr 180 Mg Cp24 (Diltiazem hcl) .Marland Kitchen... Take 1 capsule by mouth once a day    Hyzaar 50-12.5 Mg Tabs (Losartan potassium-hctz) .Marland Kitchen... Take 1 tablet by mouth once a day  Problem # 2:  Hx of CARDIAC ARRHYTHMIA (ICD-427.9) She denies CP, palpit, ch in SOB, edema, etc... Her updated medication list for this problem includes:    Toprol Xl 50 Mg Tb24 (Metoprolol succinate) .Marland Kitchen... Take one half of a tablet by mouth once daily  Problem # 3:  HYPERCHOLESTEROLEMIA, MILD (ICD-272.0) Stable on low chol/ low fat diet...  Problem # 4:  CONSTIPATION, CHRONIC  (ICD-564.09) GI is stable on Omep & Prn laxatives...  Problem # 5:  DEGENERATIVE JOINT DISEASE, GENERALIZED (ICD-715.00) Stable on Tramadol... Her updated medication list for this problem includes:    Tramadol Hcl 50 Mg Tabs (Tramadol hcl) .Marland Kitchen... Take 1 tab by mouth three times a day as needed for pain...  Problem # 6:  OSTEOPOROSIS (ICD-733.00) BMD 6/11 showed sl worsening TScores> rec to continue Boniva, calcium, Vits, VitD, & wt bearing exercise. Her updated medication list for this problem includes:    Boniva 150 Mg Tabs (Ibandronate sodium) .Marland Kitchen... Take one tablet by mouth every month  Problem # 7:  TREMOR (ICD-781.0) She has some titubations and Mysoline helps...  Problem # 8:  ANXIETY (ICD-300.00) She does not want anxiolytic therapy...  Complete Medication List: 1)  Toprol Xl 50 Mg Tb24 (Metoprolol succinate) .... Take one half of a tablet by mouth once daily 2)  Diltiazem Hcl Cr 180 Mg Cp24 (Diltiazem hcl) .... Take 1 capsule by mouth once a day 3)  Hyzaar 50-12.5 Mg Tabs (Losartan potassium-hctz) .... Take 1 tablet by mouth once a day 4)  Omeprazole 20 Mg Tbec (Omeprazole) .... Take 1 tab by mouth two times a day - 30 min before breakfast and dinner... 5)  Boniva 150 Mg Tabs (Ibandronate sodium) .... Take one tablet by mouth every month 6)  Calcium Carbonate-vitamin D 600-400 Mg-unit Tabs (Calcium carbonate-vitamin d) .... Take 1 tablet by mouth once a day 7)  Multivitamins Tabs (Multiple vitamin) .Marland Kitchen.. 1 tab daily.Marland KitchenMarland Kitchen 8)  Vitamin D3 400 Unit Tabs (Cholecalciferol) .... Take 1 tablet by mouth once a day 9)  Tramadol Hcl 50 Mg Tabs (Tramadol hcl) .... Take 1 tab by mouth three times a day as needed for pain... 10)  Primidone 50 Mg Tabs (Primidone) .... Take 3 tablets by mouth once daily 11)  Ferrous Sulfate 324 Mg Tbec (Ferrous sulfate) .... Take 1 tab by mouth once daily... 12)  Ocuvite Preservision Tabs (Multiple vitamins-minerals) .... Take 2 tablets by mouth once daily  Other  Orders: Flu Vaccine 22yrs + MEDICARE PATIENTS (Z6109) Administration Flu vaccine - MCR (U0454)  Patient Instructions: 1)  Today we updated your med list- see below.... 2)  We refilled the meds you requested... 3)  Today we gave you the 2011 Flu vaccine... 4)  Try warm soaks & the Tramadol pain pill for any arthritic complaints. 5)  Call for any questions.Marland KitchenMarland Kitchen 6)  Please schedule a follow-up appointment in 6 months, w/ fasting blood work at that time. Prescriptions: OMEPRAZOLE 20 MG TBEC (OMEPRAZOLE) take 1 tab by mouth two times a day -  30 min before breakfast and dinner...  #180 x 4   Entered and Authorized by:   Michele Mcalpine MD   Signed by:   Michele Mcalpine MD on 11/15/2009   Method used:   Print then Give to Patient   RxID:   [id number] HYZAAR 50-12.5 MG  TABS (LOSARTAN POTASSIUM-HCTZ) Take 1 tablet by mouth once a day  #90 x 4   Entered and Authorized by:   Michele Mcalpine MD   Signed by:   Michele Mcalpine MD on 11/15/2009   Method used:   Print then Give to Patient   RxID:   [id number] TOPROL XL 50 MG  TB24 (METOPROLOL SUCCINATE) Take one half of a tablet by mouth once daily  #45 x 4   Entered and Authorized by:   Michele Mcalpine MD   Signed by:   Michele Mcalpine MD on 11/15/2009   Method used:   Print then Give to Patient   RxID:   [id number]   Flu Vaccine Consent Questions     Do you have a history of severe allergic reactions to this vaccine? no    Any prior history of allergic reactions to egg and/or gelatin? no    Do you have a sensitivity to the preservative Thimersol? no    Do you have a past history of Guillan-Barre Syndrome? no    Do you currently have an acute febrile illness? no    Have you ever had a severe reaction to latex? no    Vaccine information given and explained to patient? yes    Are you currently pregnant? no    Lot Number:[id number]   Exp Date:08/18/2010   Site Given  Left Deltoid IMedflu   Randell Loop [facility]  November 15, 2009 3:40 PM

## 2010-03-22 NOTE — Miscellaneous (Signed)
Summary: BONE DENSITY  Clinical Lists Changes  Orders: Added new Test order of T-Bone Densitometry (77080) - Signed Added new Test order of T-Lumbar Vertebral Assessment (77082) - Signed 

## 2010-03-22 NOTE — Progress Notes (Signed)
Summary: returning call  Phone Note Call from Patient Call back at Home Phone [phone]   Caller: Patient Call For: nadel Summary of Call: Retutning phone call. Initial call taken by: Darletta Moll,  January 26, 2010 11:04 AM  Follow-up for Phone Call        Marliss Czar, did you call this pt? Pls advise and I will be happy to relay a msg, thanks! Vernie Murders  January 26, 2010 11:08 AM   Additional Follow-up for Phone Call Additional follow up Details #1::        called pt back about the insurance will not cover the boniva in 2012---SN recs to change to alendronate 70/weekly..Hiliary Osorto is ok with this change but she stated that she wanted SN to know that she saw Dr. Priscille Kluver 2 wks ago due to lower back pain and down into her leg--had mri done and they told her that the nerves in the spine are curved due to arthritis and she will have injections starting on tuesday for this.  SN is aware of this.    New/Updated Medications: ALENDRONATE SODIUM 70 MG TABS (ALENDRONATE SODIUM) take one tablet by mouth every week Prescriptions: ALENDRONATE SODIUM 70 MG TABS (ALENDRONATE SODIUM) take one tablet by mouth every week  #4 x 11   Entered by:   Randell Loop CMA   Authorized by:   Michele Mcalpine MD   Signed by:   Randell Loop CMA on 01/26/2010   Method used:   Electronically to        [facility] (retail)       [address]       [place], [place]  [address]       Ph: [phone]       Fax: [phone]   RxID:   [id number]

## 2010-03-22 NOTE — Medication Information (Signed)
Summary: Boniva/[facility]  [facility]   Imported By: Sherian Rein 01/31/2010 13:16:36  _____________________________________________________________________  External Attachment:    Type:   Image     Comment:   External Document

## 2010-05-15 ENCOUNTER — Encounter: Payer: Self-pay | Admitting: Pulmonary Disease

## 2010-05-18 ENCOUNTER — Ambulatory Visit (INDEPENDENT_AMBULATORY_CARE_PROVIDER_SITE_OTHER): Payer: Medicare Other | Admitting: Pulmonary Disease

## 2010-05-18 ENCOUNTER — Other Ambulatory Visit (INDEPENDENT_AMBULATORY_CARE_PROVIDER_SITE_OTHER): Payer: Medicare Other | Admitting: Pulmonary Disease

## 2010-05-18 ENCOUNTER — Encounter: Payer: Self-pay | Admitting: Pulmonary Disease

## 2010-05-18 ENCOUNTER — Other Ambulatory Visit: Payer: Self-pay | Admitting: Pulmonary Disease

## 2010-05-18 ENCOUNTER — Other Ambulatory Visit (INDEPENDENT_AMBULATORY_CARE_PROVIDER_SITE_OTHER): Payer: Medicare Other

## 2010-05-18 VITALS — BP 110/60 | HR 66 | Temp 97.6°F | Ht <= 58 in | Wt 147.4 lb

## 2010-05-18 DIAGNOSIS — E7800 Pure hypercholesterolemia, unspecified: Secondary | ICD-10-CM

## 2010-05-18 DIAGNOSIS — R5383 Other fatigue: Secondary | ICD-10-CM

## 2010-05-18 DIAGNOSIS — R259 Unspecified abnormal involuntary movements: Secondary | ICD-10-CM

## 2010-05-18 DIAGNOSIS — M810 Age-related osteoporosis without current pathological fracture: Secondary | ICD-10-CM

## 2010-05-18 DIAGNOSIS — I10 Essential (primary) hypertension: Secondary | ICD-10-CM

## 2010-05-18 DIAGNOSIS — D649 Anemia, unspecified: Secondary | ICD-10-CM

## 2010-05-18 DIAGNOSIS — R5381 Other malaise: Secondary | ICD-10-CM

## 2010-05-18 DIAGNOSIS — R531 Weakness: Secondary | ICD-10-CM

## 2010-05-18 DIAGNOSIS — N390 Urinary tract infection, site not specified: Secondary | ICD-10-CM

## 2010-05-18 DIAGNOSIS — K449 Diaphragmatic hernia without obstruction or gangrene: Secondary | ICD-10-CM

## 2010-05-18 DIAGNOSIS — M159 Polyosteoarthritis, unspecified: Secondary | ICD-10-CM

## 2010-05-18 LAB — BASIC METABOLIC PANEL
Chloride: 101 mEq/L (ref 96–112)
Potassium: 4.3 mEq/L (ref 3.5–5.1)
Sodium: 140 mEq/L (ref 135–145)

## 2010-05-18 LAB — CBC WITH DIFFERENTIAL/PLATELET
Basophils Relative: 0.4 % (ref 0.0–3.0)
Eosinophils Absolute: 0.1 10*3/uL (ref 0.0–0.7)
HCT: 36.5 % (ref 36.0–46.0)
Lymphs Abs: 1.5 10*3/uL (ref 0.7–4.0)
MCHC: 35.1 g/dL (ref 30.0–36.0)
MCV: 99 fl (ref 78.0–100.0)
Monocytes Absolute: 0.4 10*3/uL (ref 0.1–1.0)
Neutrophils Relative %: 67.1 % (ref 43.0–77.0)
Platelets: 281 10*3/uL (ref 150.0–400.0)
RBC: 3.69 Mil/uL — ABNORMAL LOW (ref 3.87–5.11)

## 2010-05-18 LAB — LIPID PANEL
Total CHOL/HDL Ratio: 4
VLDL: 24.2 mg/dL (ref 0.0–40.0)

## 2010-05-18 LAB — HEPATIC FUNCTION PANEL
ALT: 19 U/L (ref 0–35)
AST: 25 U/L (ref 0–37)
Bilirubin, Direct: 0.1 mg/dL (ref 0.0–0.3)
Total Bilirubin: 0.6 mg/dL (ref 0.3–1.2)

## 2010-05-18 NOTE — Patient Instructions (Signed)
Today we updated your med list...    Continue your current medications the same...  Today we did your follow up FASTING blood work...    Please call the PHONE TREE in a few days for your results:    Dial [phone] & when prompted enter your pt number followed by the # symbol>    Your pt number= [phone]  Stay as active as poss & consider following up w/ DrRendall regarding a physical therapy program. Call for any questions... Let's plan a follow up visit in 6 month, sooner if needed.Marland KitchenMarland Kitchen

## 2010-05-18 NOTE — Progress Notes (Signed)
Subjective:    Patient ID: [name], female    DOB: [birth year], 77 y.o.   MRN: [id number]  HPI 77 y/o WF here for a follow up visit... she has mult med problems including:  HBP;  Cardiac arrhythmia;  VV/ Ven IOnsuffic;  Hyperchol;  HH- s/p nissen/ Constip;  UTIs;  DJD;  Osteoporosis;  Tremor;  Anxiety;  Hx of anemia...  ~  November 15, 2009:  overall doing well & still working... she notes "growth" on neck near suprasternal notch- exam shows prom sternoclavic heads bilat, sl tender, & ?cyst in soft tissue (thyroid is normal)... she also notes a sore in her right nares that continues to bother her & we discussed ENT referral to get this checked out... BP remains controlled, denies CP/ palpit/ ch in SOB/ etc;  she continues on the Boniva, calcium, MVI, & Vit D... takes Tramadol for pain & Primadone for tremor... she brought meds today & reviewed w/ pt- OK refills & Flu shot today.  ~  May 18, 2010:  6 month ROV & she tells me that she had to quit work due to her knees & LBP;  She saw DrRendall for Ortho & had MRI "it's worse than I thought" & no surg option per DrRendall, takes Tramadol & Tylenol;  she had shots from [facility] & he gave her Methocarbamol as needed- slightly better she says...    HBP>  On Metoprolol, Diltiazem & Hyzaar;  BP= 110/60, taking meds regularly & tol well;  Denies CP, palpit, dizzy, SOB, edema, etc...    Chol>  On diet alone;  FLP shows TChol 238, TG 121, HDL 55, LDL 158; she has repeatedly refused med Rx...    GI>  Hx HH s/p nissen & constip on Omep, Miralax, Senakot-S;  Doing well w/o heartburn, abd pain, swelling, n/v, change in BMs etc...    Osteop>  On Alendronate, calcium, MVI, Vit D; 2000 u daily;  Last BMD 6/11 reviewed w/ TScore -2.6 in right FemNeck...    Tremor> w/ head titubations on Primadone 50mg  Tid & stable...     Anxiety>  She manages well & doesn't require meds...   Past Medical History  Diagnosis Date  . Hypertension   . Cardiac dysrhythmia,  unspecified   . Venous insufficiency   . Pure hypercholesterolemia   . Hiatal hernia   . Chronic constipation   . Urinary tract infection, site not specified   . Generalized osteoarthrosis, unspecified site   . Osteoporosis   . Abnormal involuntary movements   . Anxiety state, unspecified   . Anemia     Outpatient Encounter Prescriptions as of 05/18/2010  Medication Sig Dispense Refill  . alendronate (FOSAMAX) 70 MG tablet Take 70 mg by mouth every 7 (seven) days. Take with a full glass of water on an empty stomach.       . calcium-vitamin D (OSCAL 500/200 D-3) 500-200 MG-UNIT per tablet Take 1 tablet by mouth daily.        . Cholecalciferol (VITAMIN D3) 2000 UNITS TABS Take 1 tablet by mouth daily.        Marland Kitchen diltiazem (DILACOR XR) 180 MG 24 hr capsule Take 180 mg by mouth daily.        . Ferrous Sulfate (IRON) 325 (65 FE) MG TABS Take 1 tablet by mouth daily.        Marland Kitchen losartan-hydrochlorothiazide (HYZAAR) 50-12.5 MG per tablet Take 1 tablet by mouth daily.        Marland Kitchen  metoprolol (TOPROL-XL) 50 MG 24 hr tablet 1/2 twice per day       . Multiple Vitamins-Minerals (PRESERVISION AREDS 2 PO) Take 2 capsules by mouth daily.        Marland Kitchen omeprazole (PRILOSEC) 20 MG capsule Take 20 mg by mouth 2 (two) times daily before lunch and supper.        . primidone (MYSOLINE) 50 MG tablet 3 once daily       . traMADol (ULTRAM) 50 MG tablet Take 50 mg by mouth every 8 (eight) hours as needed.        Marland Kitchen DISCONTD: Calcium Carbonate-Vitamin D (CALCIUM 600+D) 600-400 MG-UNIT per tablet Take 1 tablet by mouth daily.        Marland Kitchen DISCONTD: ferrous sulfate 325 (65 FE) MG tablet Take 325 mg by mouth daily.        Marland Kitchen DISCONTD: Multiple Vitamin (MULTIVITAMIN) capsule Take 1 capsule by mouth daily.        Marland Kitchen DISCONTD: Multiple Vitamins-Minerals (OCUVITE EXTRA) TABS Take 2 tablets by mouth daily.        Marland Kitchen DISCONTD: vitamin E 400 UNIT capsule Take 400 Units by mouth daily.          No Known Allergies   Review of Systems         See HPI       The patient complains of decreased hearing and dyspnea on exertion.  The patient denies anorexia, fever, weight loss, weight gain, vision loss, hoarseness, chest pain, syncope, peripheral edema, prolonged cough, headaches, hemoptysis, abdominal pain, melena, hematochezia, severe indigestion/heartburn, hematuria, incontinence, muscle weakness, suspicious skin lesions, transient blindness, difficulty walking, depression, unusual weight change, abnormal bleeding, enlarged lymph nodes, and angioedema.     Objective:   Physical Exam      WD, WN, 77 y/o WF in NAD... GENERAL:  Alert & oriented; pleasant & cooperative... HEENT:  [place]/AT, EOM-wnl, PERRLA, EACs-clear, TMs-wnl, NOSE-clear, THROAT-clear & wnl. NECK:  Supple w/ fairROM; no JVD; normal carotid impulses w/o bruits; no thyromegaly or nodules palpated; no lymphadenopathy. CHEST:  Clear to P & A; without wheezes/ rales/ or rhonchi. HEART:  Regular Rhythm; without murmurs/ rubs/ or gallops. ABDOMEN:  Soft & nontender; normal bowel sounds; no organomegaly or masses detected. EXT: without deformities, mod arthritic changes; no varicose veins/ +venous insuffic/ no edema. NEURO:  CN's intact;  no focal neuro deficits... DERM:  No lesions noted; no rash etc...   Assessment & Plan:

## 2010-05-19 ENCOUNTER — Encounter: Payer: Self-pay | Admitting: Pulmonary Disease

## 2010-05-19 NOTE — Assessment & Plan Note (Signed)
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

## 2010-05-19 NOTE — Assessment & Plan Note (Signed)
Stable on Omeprazole 20mg  Bid & this controls her symptoms;  We are monitoring her BMD as well 7 she is taking Alendronate as noted above.Marland KitchenMarland Kitchen

## 2010-05-19 NOTE — Assessment & Plan Note (Signed)
See lab summary section:  TChol ~200 & LDL is 140-160, but she hasn't wanted to start med rx&7 prefers diet alone... She will consider her options for med rx again since LDL is 158 today.Marland KitchenMarland Kitchen

## 2010-05-19 NOTE — Assessment & Plan Note (Signed)
BP is well controlled & stable on ToprolXL, Diltiazem, & Hyzaar...  Continue same.

## 2010-05-19 NOTE — Assessment & Plan Note (Signed)
She is stable on the alendronate 70mg /wk now + her other meds (Calcium, MVI, Vit D).Marland KitchenMarland Kitchen

## 2010-05-22 ENCOUNTER — Other Ambulatory Visit: Payer: Self-pay | Admitting: Pulmonary Disease

## 2010-05-22 MED ORDER — PRAVASTATIN SODIUM 40 MG PO TABS: 40.0000 mg | ORAL_TABLET | Freq: Every evening | ORAL | Status: DC

## 2010-05-29 ENCOUNTER — Other Ambulatory Visit: Payer: Self-pay | Admitting: Pulmonary Disease

## 2010-06-18 ENCOUNTER — Other Ambulatory Visit: Payer: Self-pay | Admitting: Pulmonary Disease

## 2010-07-02 ENCOUNTER — Telehealth: Payer: Self-pay | Admitting: Pulmonary Disease

## 2010-07-02 MED ORDER — PRIMIDONE 50 MG PO TABS: 50.0000 mg | ORAL_TABLET | Freq: Three times a day (TID) | ORAL | Status: DC

## 2010-07-02 NOTE — Telephone Encounter (Signed)
According to note LOV w/ Dr. Kriste Basque  She is on primidone Three times a day   Okay to refill #90, 5 refills She was just here in march.

## 2010-07-02 NOTE — Telephone Encounter (Signed)
Last ov note looks like this med was d/c'ed or the pt was not taking. Pls advise if okay to refill thanks

## 2010-07-02 NOTE — Telephone Encounter (Signed)
Rx was refilled. Spoke with pt's daughter and notified this was done. She asks that we sent a 90 day supply and this was done.

## 2010-07-06 NOTE — Discharge Summary (Signed)
[name], [name]               ACCOUNT NO.:  [id number]   MEDICAL RECORD NO.:  [id number]          PATIENT TYPE:  INP   LOCATION:  0355                         FACILITY:  [facility]   PHYSICIAN:  Angelia Mould. Derrell Lolling, M.D.DATE OF BIRTH:  1924/01/18   DATE OF ADMISSION:  01/23/2004  DATE OF DISCHARGE:  01/26/2004                                 DISCHARGE SUMMARY   FINAL DIAGNOSES:  1.  Large paraesophageal hernia.  2.  Gastroesophageal reflux disease.  3.  Gallstones.  4.  Hypertension.  5.  Remote history of arrhythmia.   OPERATION PERFORMED:  Laparoscopic repair of diaphragmatic hernia,  laparoscopic Nissen fundoplication , laparoscopic cholecystectomy.  Date of  surgery January 23, 2004.   HISTORY:  This is a 77 year old white female who has a long history of  numerous GI complaints. Colonoscopy has shown a tortuous colon.  CT scan  shows a large hiatal hernia with a small amount of the splenic flexure of  the colon in the hernia sac. She has an ultrasound which shows gallstones.  She has had an upper GI which shows a large paraesophageal hernia with good  visualization of the GE junction in the normal position. She had a recent  cardiac evaluation and was found to have no evidence of ischemia and a  normal ejection fraction. She has had a manometry which shows normal  esophageal peristalsis. She has had an upper endoscopy in August of this  year which showed a distal esophageal stricture which was dilated.  Because  of her large paraesophageal hernia, I was asked to consider repairing this.  I felt that this was appropriate due to her numerous GI complaints most of  which are probably related to the hernia. She is brought to the hospital  electively for repair of her hernia, antireflux surgery and cholecystectomy.   PHYSICAL EXAMINATION:  GENERAL:  Alert, older white female in no distress.  Appears fit for her age.  NECK:  No mass or jugular venous distention.  LUNGS:  Clear.  HEART:  Regular rate and rhythm, no murmur.  Peripheral pulses palpable.  ABDOMEN:  Soft and nontender, no hernia, no mass, not distended, well healed  Pfannenstiel incision.   HOSPITAL COURSE:  On the day of admission, the patient was taken to the  operating room and underwent laparoscopic repair of her very large  paraesophageal hernia. We were able to completely reduce the hernia and  strip the hernia sac out, close the diaphragm posteriorly over Pledget's,  perform a Nissen fundoplication  and a cholecystectomy.  Final pathology  report on the gallbladder showed chronic cholecystitis with cholelithiasis.   Postoperatively, the patient did well.  Postoperative day one, she looked  fairly good and we proceeded with a barium swallow and that looked fine. The  stomach emptied okay, there was no obstruction, fundoplication looked to be  intact, there was no leak.   It was also noted that the patient had an E. coli urinary tract infection  and this was picked up preoperatively and she was maintained on intravenous  Ancef to treat this.   She  was started on a full liquid diet on January 25, 2004, she did well with  that, had no dysphagia or nausea, vomiting or bloating and began having  bowel movements and felt well. She was discharged on January 26, 2004.  On  the day of discharge, she was tolerating a full liquid diet well, ambulating  independently, abdomen was soft and benign and the wounds looked fine.   She was advised to pursue a pureed diet and to continue her usual  medications.  Her usual medications include:   1.  Fosamax 70 mg weekly.  2.  Toprol XL 25 mg daily.  3.  Protonix 40 mg daily.  4.  Hyzaar 50/12.5 mg daily.  5.  Primidone 50 mg t.i.d.  6.  Diltiazem CD 180 mg daily.  7.  Calcium with D daily.  8.  Icaps b.i.d.   She was asked to followup with me in the office in 5-7 days.  Diet and  activities were discussed. She was asked to followup with Dr. Alroy Dust   regarding her urinary tract infection to make sure that had cleared and she  stated she would do that.     Hayw   HMI/MEDQ  D:  02/20/2004  T:  02/20/2004  Job:  [id number]   cc:   Vania Rea. Jarold Motto, M.D. [facility]   Scott M. Kriste Basque, M.D. [facility]   Charlies Constable, M.D. [facility]

## 2010-07-06 NOTE — Op Note (Signed)
NAMEKYERRA, VARGO               ACCOUNT NO.:  [id number]   MEDICAL RECORD NO.:  [id number]          PATIENT TYPE:  AMB   LOCATION:  ENDO                         FACILITY:  [facility]   PHYSICIAN:  Vania Rea. Jarold Motto, M.D. [facility] OF BIRTH:  [birth year]   DATE OF PROCEDURE:  11/15/2003  DATE OF DISCHARGE:  11/15/2003                                 OPERATIVE REPORT   PROCEDURE:  Esophageal manometry.   GASTROENTEROLOGIST:  Vania Rea. Jarold Motto, M.D.   RESULTS:  1.  Upper esophageal sphincter -- there appears to be good coordination      between pharyngeal contraction and cricopharyngeal relaxation.  2.  Lower esophageal sphincter -- lower esophageal sphincter pressure is      normal at approximately 20 mmHg.  The lower esophageal sphincter relaxes      approximately 60% to 70% of the time.  3.  Motility pattern -- There is normal esophageal peristalsis present with      mean amplitude of contractions approximately 70 mmHg.   ASSESSMENT:  This is a normal esophageal manometry without any changes here  to explain the patient's symptomatology.       DRP/MEDQ  D:  11/18/2003  T:  11/19/2003  Job:  [id number]

## 2010-07-06 NOTE — Assessment & Plan Note (Signed)
[facility]                         GASTROENTEROLOGY OFFICE NOTE   HALLA, CHOPP                      MRN:          [id number]  DATE:04/29/2006                            DOB:          11-10-1923    [name] is an 77 year old white female, who continues to have  constipation problems.  She got no relief with Amitiza.  She currently  relates she will go to the bathroom every other day, but will have  crampy lower abdominal pain, followed by nausea and vomiting, and then  she will have loose, watery bowel movements, without melena or  hematochezia.  She is not taking laxatives at this time.  She has had no  anorexia, weight-loss, and otherwise is doing well.  She is status post  extensive surgery by Dr. Derrell Lolling for a large paraesophageal hernia in  December of 2005.  This also included a cholecystectomy at that time.   The patient really denies true reflux symptoms.  She is taking over-the-  counter Prilosec daily.  She is on multiple cardiac medications, listed  and reviewed in her chart.  Despite all the above complaints, she has  not had any weight-loss.   She weighs 157 pounds, her blood pressure is 130/58, pulse was 80 and  regular.  She was not dehydrated, appeared healthy and normal and was in  no acute distress.  Her abdominal exam showed no distention,  organomegaly, masses or tenderness.  Inspection of the rectum was  unremarkable, as was rectal exam.  There was hard, formed stool, almost  giving an impaction at the end of the digit.  Stool was guaiac-negative.   ASSESSMENT:  Previous attempts at colonoscopy have been unsuccessful  because of her markedly redundant and tortuous sigmoid colon, confirmed  by barium enema exam a couple of years ago.  She has also had esophageal  manometries, showing no evidence of motility disorder of the gut.  I  suspect that she has anatomical basis for constipation, which is her  main problem.  She  relates that previous attempts at fiber supplements  only made her problems worse.   I have decided today to give her Colyte prep and to have her drink this  as per colonoscopy exam until clear.  She then is to start MiraLax 8  ounces at bedtime on a regular basis and to use a Dulcolax suppository  every other day as needed.  If this is not successful, we will proceed  with sitz marker studies of her colon and other attempts at laxative  therapy, depending on the results of the sitz marker study.  Extreme  treatment for this severe constipation would be sigmoid colectomy, but  hopefully that will not need to be done.   NOTE:  The patient is to see me in two weeks' time for followup.     Vania Rea. Jarold Motto, MD, Caleen Essex, FAGA  Electronically Signed   DRP/MedQ  DD: 04/29/2006  DT: 05/01/2006  Job #: [id number]   cc:   Lonzo Cloud. Kriste Basque, MD

## 2010-07-06 NOTE — Assessment & Plan Note (Signed)
[facility]                         GASTROENTEROLOGY OFFICE NOTE   [name], [name]                      MRN:          [id number]  DATE:06/05/2006                            DOB:          1924-01-01    PROCEDURE:  Small bowel capsule endoscopy.   Please see the full capsule endoscopy report that is in the chart.   INDICATIONS:  Iron-deficiency anemia with heme-positive stool/chronic  occult blood-loss anemia.   FINDINGS:  One red lesion in the duodenum that could be an erosion, AVM,  or submucosal heme.  Areas of poor visualization due to partially  digested food and fluid.  The capsule did not reach the colon before  eight hours of monitoring expired.   SUMMARY/RECOMMENDATIONS:  I doubt that this one red lesion is causing  her anemia.  Further investigation per her referring physician, Dr.  Sheryn Bison.  Consider checking a KUB if she does not pass the  capsule in 2-3 weeks.     Iva Boop, MD,FACG  Electronically Signed    CEG/MedQ  DD: 06/12/2006  DT: 06/13/2006  Job #: [id number]

## 2010-07-06 NOTE — H&P (Signed)
[name], [name]               ACCOUNT NO.:  [id number]   MEDICAL RECORD NO.:  [id number]          PATIENT TYPE:  AMB   LOCATION:  DAY                          FACILITY[facility]   PHYSICIAN:  Angelia Mould. Derrell Lolling, M.D.DATE OF BIRTH:  1923/07/15   DATE OF ADMISSION:  01/23/2004  DATE OF DISCHARGE:                                HISTORY & PHYSICAL   CHIEF COMPLAINT:  1.  Paraesophageal hernia.  2.  Heartburn.  3.  Gallstones.   HISTORY OF PRESENT ILLNESS:  This is a 77 year old white female with a long  history of numerous GI symptoms. Most notably is intermittent episodes of  mid-abdominal pain and altered bowel habits. She has had attempted  colonoscopy and a complete barium enema which does not show anything except  a tortuous colon. She has had a CT scan which shows a large hiatal hernia  and a small amount of splenic flexure in the hernia sac. She has had an  ultrasound which shows a 3-cm gallstone. She has had an upper GI earlier  this year which shows a large paraesophageal hernia with good visualization  of the GE junction in the normal position. She was ultimately worked up by  Dr. Charlies Constable and Dr. Willa Rough with extensive cardiac evaluation. She  was found to have no ischemia and a normal ejection fraction of 70%. She has  had manometry which shows normal esophageal peristalsis. She has had an  upper endoscopy in August of this year which showed a distal esophageal  stricture which was dilated. She has had no dysphagia. She has been advised  to electively have repair of her paraesophageal hernia, antireflux surgery  and if everything was going well proceed with cholecystectomy. She is  admitted electively for that surgery.   PAST HISTORY:  1.  She had a hysterectomy and a bladder suspension.  2.  Remote history of arrhythmia.  3.  Benign tremor.  4.  Denies history of chest pain or heart attack.   CURRENT MEDICATIONS:  1.  Fosamax 70 mg weekly.  2.  Toprol-XL  25 mg daily.  3.  Protonix 40 mg b.i.d.  4.  Hyzaar 50-12.5 mg daily.  5.  Primidone 50 mg t.i.d.  6.  Diltiazem CD 180 mg daily.  7.  Aspirin 81 mg daily (discontinued 1 week ago).  8.  Calcium 900 + D at bedtime.  9.  Tylenol p.r.n.  10. ICAPS b.i.d.   DRUG ALLERGIES:  None known.   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   FAMILY HISTORY:  Father deceased, but she knows nothing of his medical  problems. Mother died of cancer of the pancreas.   REVIEW OF SYSTEMS:  A 15-system review of systems is performed and it is  noncontributory except as described above.   PHYSICAL EXAMINATION:  GENERAL:  An older white female in no distress. She  appears fit for her age.  VITAL SIGNS:  Temperature 97.7, heart rate 63, respirations 16, blood  pressure (in the office) was 148/80, height 5 feet 1 inch, weight 147.  EYES:  Sclerae are clear, extraocular movements intact.  EAR, NOSE, MOUTH AND THROAT:  Nose, lips, tongue and oropharynx are without  gross lesions. She has dentures.  NECK:  Supple, nontender, no mass, no jugular venous distention.  LUNGS:  Clear to auscultation. No chest wall tenderness.  HEART:  Regular rate and rhythm, no murmur. She appears to be in sinus  rhythm. Radial and femoral pulses are palpable.  BREASTS:  Not examined.  ABDOMEN:  Soft, not tender, not distended, no palpable mass, no obvious  hernia, well-healed Pfannenstiel incision.  EXTREMITIES:  Moves all 4 extremities well without pain or deformity.  NEUROLOGIC:  No gross motor or sensory deficits.   ASSESSMENT:  1.  Large paraesophageal hernia. She is at risk for incarceration and      strangulation.  2.  Gastroesophageal reflux disease with recurrent esophageal stricture,      recently dilated, symptoms controlled on Protonix.  3.  Solitary large gallstone. The correlation of this with her symptoms  is      unknown.  4.  Hypertension.  5.  Remote history of arrhythmia, followed by Charlies Constable, M.D.  6.  Recently normal Cardiolite stress test.   PLAN:  Proceed with repair of her hiatal hernia, antireflux surgery and  cholecystectomy.     Hayw   HMI/MEDQ  D:  01/23/2004  T:  01/23/2004  Job:  [id number]   cc:   Vania Rea. Jarold Motto, M.D. [facility]   Charlies Constable, M.D. [facility]   Scott M. Kriste Basque, M.D. [facility]

## 2010-07-06 NOTE — Assessment & Plan Note (Signed)
[facility]                             PULMONARY OFFICE NOTE   MARYLIN, LATHON                      MRN:          [id number]  DATE:06/12/2006                            DOB:          1923/06/09    HISTORY OF PRESENT ILLNESS:  This is a very pleasant 77 year old female  patient of Dr. Jodelle Green who has a known history of severe  gastroesophageal reflux with previous large paraesophageal hernia status  post repair in December 2005 and status post Nissen fundoplication.  Patient is also status post laparoscopic repair of a ventral incisional  hernia in April 2007.  Patient was recently found to have severe anemia  on May 15, 2006, with a hemoglobin of 6.5.  Patient did receive two  units of packed red blood cells and is followed closely by Dr.  Jarold Motto.  She underwent an endoscopy and colonoscopy on May 19, 2006.  Unfortunately, the colonoscopy was incomplete secondary to a  tortuous colon.  Endoscopy showed some stenosis at the distal esophagus  and showed a small hiatal hernia and significant erythematous mucosa.  Patient did undergo a capsule endoscopy on June 05, 2006, which showed  one red area in the duodenum which was inconclusive of the etiology.  Patient presents complaining of persistent symptoms that have been going  on for several months of intermittent episodes of nausea, vomiting, and  epigastric discomfort especially after eating.  Patient will eat, become  nauseous and shortly after, vomiting she will have episodes of loose  watery stools.  Patient was unable to tolerate MiraLax due to bowel  incontinence.  Patient has had chronic episodes of constipation with  loose watery stools and nausea and vomiting over the last several  months.  Patient has had multiple trials of different medications  including MiraLax and Amitiza.  Patient is accompanied by her daughter  today and reports that patient is getting very weak and symptoms  seem to  be worsened over the last week especially after she was started on iron  therapy.  Patient denies any hematemesis, bloody stools, fever or  urinary symptoms.  Most recent blood work showed hemoglobin was  increased up to 8.8 after transfusion on May 30, 2006.  Iron levels  were low at 35 iron and 9.4 saturation.   PAST MEDICAL HISTORY:  Reviewed in detail.   CURRENT MEDICATIONS:  Reviewed in detail.   PHYSICAL EXAMINATION:  GENERAL APPEARANCE:  Patient is a very frail,  elderly female in no acute distress.  VITAL SIGNS:  She is afebrile with stable vital signs.  Weight is down 3  pounds at 150.  HEENT:  Sclerae are nonicteric.  Posterior pharynx clear.  Oral mucosa  is pink and moist.  NECK:  Supple without cervical adenopathy, no JVD.  LUNGS:  Lung sounds revealed diminished breath sounds in the bases.  CARDIOVASCULAR:  Regular rate and rhythm.  ABDOMEN:  Soft with positive bowel sounds throughout all four quadrants.  Patient has epigastric tenderness.  No guarding or rebound noted.  EXTREMITIES:  Warm without any calf clubbing, cyanosis, or edema.  IMPRESSION AND PLAN:  1. Ongoing abdominal discomfort with intermittent episodes of nausea,      vomiting, and watery stools.  Symptoms seem to be worsened after      eating.  Have recommended that patient eat smaller, more frequent      meals such as six small meals a day instead of three larger meals.      Have also recommended that patient, at this time, hold ketoprofen,      Fosamax, aspirin and iron which all could be causing      gastrointestinal distress.  Patient is presently on Protonix daily.      Have recommended that patient change over from Protonix to Prevacid      and was given samples.  Occasionally Protonix can cause worsening      diarrhea, however, I do not feel like this is probably the case but      we will try Prevacid to see if she tolerates this better.  Patient      will increase Prevacid up to  twice a day before meals.  Patient      will return here in one week or sooner if needed.  We will check on      Zofran for nausea to see if this helps, however, will need to check      pricing to see if this works.  2. Severe anemia with extensive work-up with gastroenterology with no      significant findings for the source of her bleeding.  Patient is      improved after her transfusion.  Currently is not tolerating iron.      Will recheck next week.  Patient may need iron transfusion since      she is unable to tolerate oral iron.      Rubye Oaks, NP  Electronically Signed      Lonzo Cloud. Kriste Basque, MD  Electronically Signed   TP/MedQ  DD: 06/13/2006  DT: 06/13/2006  Job #: [id number]

## 2010-07-06 NOTE — Assessment & Plan Note (Signed)
[facility]                         GASTROENTEROLOGY OFFICE NOTE   EVANGELIA, WHITAKER                      MRN:          [id number]  DATE:05/13/2006                            DOB:          1924-01-26    INCOMPLETE DICTATION.     Vania Rea. Jarold Motto, MD, Caleen Essex, FAGA  Electronically Signed    DRP/MedQ  DD: 05/13/2006  DT: 05/13/2006  Job #: [id number]

## 2010-07-06 NOTE — Assessment & Plan Note (Signed)
[facility]                         GASTROENTEROLOGY OFFICE NOTE   RAND, BOLLER                      MRN:          [id number]  DATE:01/28/2006                            DOB:          [birth year]    REFERRING PHYSICIAN:  Lonzo Cloud. Kriste Basque, MD   [name] is an 77 year old white female referred by Dr. Kriste Basque for  evaluation and consultation.   [name] has a very long, tortuous and redundant colon. I was unable  to complete her colonoscopy examination several years ago and did a  barium enema, which was fairly unremarkable except for diverticulosis.  She had a large paraesophageal hernia that was repaired by Dr. Derrell Lolling  with also laparoscopic cholecystectomy in January 2005. She has had  several esophageal studies since that time which have been unremarkable  despite her continued attack, complaints of atypical chest pain. She  also had laparoscopic repair of a ventral incisional hernia in April  2007.   She recently complained of being placed on some bladder medication by  Dr. Kriste Basque, which caused her to have nausea and vomiting. It is unclear  exactly what this medication was. In any case, since she discontinued  this medication, she is no longer having nausea and vomiting, but is now  constipated. She is on multiple medications, listed and reviewed in her  chart, which do include Fosamax 70 mg a week and daily Protonix.   PHYSICAL EXAMINATION:  Her exam today, she weighs 157 pounds, which is  her normal weight and blood pressure is 120/60. I could not appreciate  hepatosplenomegaly, abdominal masses, tenderness, or distention. Bowel  sounds were normal.  Inspection of the breasts was unremarkable as was  rectal examination. There was formed hard stool in the rectal vault that  was guaiac negative. There was no evidence of an impaction. Stool was  guaiac negative.   ASSESSMENT:  I think [name] has chronic constipation related to her  colonic tortuosity and irritable bowel syndrome. She has had previous  manometry, which has not shown evidence of a GI motility disorder.   RECOMMENDATIONS:  I have given her a trial of Amitiza 24 mcg once a day  with supper in the middle of her meals to try to cut down on nausea.  Should this not be a problem, will increase her to twice a day as  tolerated while continuing her medications as listed above. I see no  need at this time for repeat endoscopic examination.     Vania Rea. Jarold Motto, MD, Caleen Essex, FAGA  Electronically Signed   DRP/MedQ  DD: 01/28/2006  DT: 01/28/2006  Job #: [id number]   cc:   Angelia Mould. Derrell Lolling, M.D.  Lonzo Cloud. Kriste Basque, MD

## 2010-07-06 NOTE — Op Note (Signed)
[name], [name]               ACCOUNT NO.:  [id number]   MEDICAL RECORD NO.:  [id number]          PATIENT TYPE:  OBV   LOCATION:  0157                         FACILITY:  [facility]   PHYSICIAN:  Angelia Mould. Derrell Lolling, M.D.DATE OF BIRTH:  Oct 29, 1923   DATE OF PROCEDURE:  01/23/2004  DATE OF DISCHARGE:                                 OPERATIVE REPORT   PREOPERATIVE DIAGNOSES:  1.  Paraesophageal hernia.  2.  Gastroesophageal reflux disease with esophageal stricture.  3.  Gallstones.   POSTOPERATIVE DIAGNOSES:  1.  Paraesophageal hernia.  2.  Gastroesophageal reflux disease with esophageal stricture.  3.  Gallstones.   OPERATION PERFORMED:  1.  Repair of paraesophageal diaphragmatic hernia.  2.  Laparoscopic Nissen fundoplication.  3.  Laparoscopic cholecystectomy.   SURGEON:  Angelia Mould. Derrell Lolling, M.D.   FIRST ASSISTANT:  Adolph Pollack, M.D.   OPERATIVE INDICATIONS:  This is a 77 year old white female who has had a lot  of GI symptoms over the years.  She has had some problems with mid abdominal  pain, heartburn, reflux-type symptoms.  Some of her abdominal pain are  crampy and will be relieved by a bowel movement.  She denies dysphagia.  Upper endoscopy has shown an esophageal stricture, which has been dilated on  two occasions and a large hiatal hernia.  A CT done in June showed a large  hiatal hernia with a small amount in the splenic flexure of the colon and  the hernia sac.  An upper GI in June of this year also shows a large  paraesophageal hernia but no tumor mass was seen.  An ultrasound shows a 3  cm solitary large gallstone.  She was advised to have her diaphragmatic  hernia repair and antireflux procedure performed as well as cholecystectomy.  She has decided that she wants to have that done and is brought to the  operating room electively.   OPERATIVE FINDINGS:  The patient had a very large diaphragmatic hernia with  about 50% of the stomach up in the chest on  initial exam.  This reduced in a  relatively straightforward manner.  There was a large mediastinal hernia  sac, all of which was removed.  The anatomy at the esophagus, cardia,  fundus, spleen, stomach, duodenum, liver, and gallbladder looked  conventionally normal.  The cystic duct was extremely tiny.  The anatomy of  the cystic duct and cystic artery were conventional.  Dr. Abbey Chatters and I  did not feel that a cholangiogram was necessary because the anatomy was  clear, and the liver function tests were normal.  The cystic duct was tiny,  and there was a solitary large stone.  During the case, we had a small  serosal tear at the cardia of the stomach, involving only the longitudinal  fibers.  This was repaired with Tisseel thrombin sealant as well as this was  covered by the fundoplication.   OPERATIVE TECHNIQUE:  Following the induction of general endotracheal  anesthesia, a Foley catheter was placed.  The patient's abdomen was prepped  and draped in a sterile fashion.  The stomach  was emptied with an oral  gastric tube.  Marcaine 0.5% with epinephrine was used as a local  infiltration anesthetic.  A small vertical incision was made about 2 cm  above the umbilicus.  The fascia was incised in the midline, and the  abdominal cavity entered under direct vision.  A 10 mm Hasson trocar was  inserted and secured with a purse-string suture of 0 Vicryl.  The  pneumoperitoneum was created.  The video camera was inserted with  visualization and findings as described above.  A 5 mm trocar was placed  laterally in the subcostal area near the anterior axillary line.  A 5 mm  deformable retractor was inserted and used to elevate the left lobe of the  liver.  This provided good visualization.  This retractor was secured with a  self-retaining retractor.  Two 10 mm trocars were placed in the right upper  quadrant, and two 10 mm trocars were placed in the left upper quadrant.  We  reduced the body and  fundus of the stomach from the mediastinum.  We could  identify the right crus through the gastrohepatic omentum.  The  gastrohepatic omentum was divided with a harmonic scalpel.  We then  dissected the right crus away from the soft tissues using the harmonic  scalpel.  We continued this dissection anteriorly, incising the hernia sac,  all the way across the apex of the crura and then down the left crus as far  as possible, dissecting the hernia sac out of the mediastinum completely.  We did identify the esophagus and the posterior vagus nerve easily.  We then  took down some of the soft tissue around the left crus.  The spleen was  tethered in this location.  We decided to go ahead and divide the short  gastric vessels with the harmonic scalpel, and then we were easily able to  visualize the splenic attachments and divided these, and the spleen fell  away.  We continued the dissection of the left crus posteriorly until we  could visualize the right crus from the left side.  We then went back over  to the right side and easily created a window behind the esophagus inferior  to the crura.  We passed a Penrose drain around this and then using a  Penrose drain, we finished the dissection, cleaning up all of the soft  tissue, mobilizing the esophagus thoroughly.   We had a very large amount of redundant hernia sac attached to the  gastroesophageal junction.  We very carefully and slowly debrided this off  of the cardia of the stomach.  When we were finished, we noticed there was a  small serosal injury.  This was not a full-thickness injury, as the circular  fibers were completely intact and there was no leakage.  This area was  probably about 1 cm x 1.5 cm in size and on the right anterolateral surface  of the cardia.  We chose to place Tisseel thrombin sealant over this and let  that dry.  We then closed the diaphragm with four interrupted sutures of 0 Surgitek  placed over Teflon  pledgets.  Three of these sutures were placed and then  tied using the Ti-Knot device.  We then passed a 50 French lighted bougie  down through the esophagus and into the stomach without any problem.  We  then found that we needed one more suture.  We pulled the dilator back a bit  and then placed  one more pledgeted suture to close the diaphragm, and that  seemed to be adequate closure with just a small gap left for the dilator.   We then passed the fundus of the stomach from the left side around  posteriorly to the right side.  We then found a suitable piece of fundus on  the left side and pulled this up over the anterior esophagus.  We were very  careful to make sure these areas of the fundus were contiguous by sliding it  back and fourth.  We then passed the dilator back down into the stomach.  We  then created a fundoplication with three interrupted sutures of 0 Surgitek.  The first two sutures were placed through the fundus on the left, the  anterior surface of the esophagus,and then the fundus on the right.  We were  careful to avoid the anterior vagus nerve.  We tied these two sutures with  the Ti-Knot device.  The third most inferior suture in the fundoplication  took only the fundus on each side, as we were getting close to the area  where we had placed Tisseel on the serosal injury, and this covered that up  quite nicely.   We irrigated the area and inspected.  We removed the bougie dilator and  inspected the esophagus, the fundoplication, the diaphragmatic closure, the  spleen and the liver.  Everything was hemostatic and looked fine.  We took  some photographs.  We then positioned the patient for the cholecystectomy.  A 10 mm trocar was placed in the subxiphoid region, the gallbladder was  identified, the fundus elevated, and the infundibulum retracted to the  right.  We dissected out the cystic duct and the cystic artery.  We did a  fairly extensive dissection, creating a  very large window.  The cystic  artery was secured with multiple metal clips and divided.  The cystic duct  was extremely tiny, and we placed multiple metal clips on that and divided  it.  We decided not to do a cholangiogram for the reasons stated above.  The  gallbladder was dissected from its bed with electrocautery, placed in a  specimen bag, and removed.  The bed of the liver was a little bit raw.  This  was controlled with electrocautery, and after irrigating this, we placed  some Surgicel gauze in the bed of the gallbladder.  We irrigated out all of  the irrigation fluid, and it was completely clear.  There did not appear to  be any bleeding whatsoever.  We went back and checked the left lobe of the  liver and the fundoplication and the diaphragm, and it looked fine.  The  spleen looked fine.  We felt that nothing further needed to be done.  The trocars were removed under direct vision.  There was no bleeding from the  trocar sites.  The pneumoperitoneum was released.  The fascia at the trocar  site above the umbilicus was closed with 0 Vicryl sutures.  The skin  incisions were closed with Steri-Strips and skin staples.  Clean bandages  were placed, and the patient was taken to the recovery room in stable  condition.  Estimated blood loss was about 30 cc.  Complications were none.  Sponge, needle, and instrument counts were correct.     Hayw   HMI/MEDQ  D:  01/23/2004  T:  01/23/2004  Job:  [id number]   cc:   Lonzo Cloud. Kriste Basque, M.D. [facility]   Vania Rea.  Jarold Motto, M.D. [facility]   Charlies Constable, M.D. [facility]

## 2010-07-06 NOTE — Assessment & Plan Note (Signed)
[facility]                         GASTROENTEROLOGY OFFICE NOTE   JAMACIA, JESTER                      MRN:          [id number]  DATE:05/13/2006                            DOB:          13-May-1923    Kameo did well as long as she was on regular MiraLax.  She ran out of  this over the weekend and had some nausea and vomiting with vague  abdominal discomfort.  Her vital signs were all stabilized and exam of  her abdomen.  I have gone ahead and set her up for colonoscopy and  endoscopy because of the persistent nature of her complaints.  Her  daughters also were present and are concerned about their mother.   As per my previous notes, she has had previous motility studies of her  gut which have been normal.  She has a very long, tortuous, redundant  colon with previous attempts at colonoscopy unsuccessful.  She is status  post repair of a large diaphragmatic hernia with fundoplication and  cholecystectomy by Dr. Derrell Lolling in December 2005.  She otherwise is stable  but is on a multitude of medications and again listed and reviewed in  the chart which I have asked the patient to continue.     Vania Rea. Jarold Motto, MD, Caleen Essex, FAGA  Electronically Signed    DRP/MedQ  DD: 05/13/2006  DT: 05/13/2006  Job #: [id number]   cc:   Lonzo Cloud. Kriste Basque, MD  Angelia Mould. Derrell Lolling, M.D.

## 2010-07-06 NOTE — Assessment & Plan Note (Signed)
[facility]                             PULMONARY OFFICE NOTE   [name], [name]                      MRN:          [id number]  DATE:03/10/2006                            DOB:          Jun 25, 1923    HISTORY OF PRESENT ILLNESS:  This is an 77 year old white female,  patient of Dr. Jodelle Green who has a known history of hypertension,  osteoarthritis, and reflux.  Presents for an acute office visit.  The  patient complains of a 5-day history of left sided neck pain that is  worse when she turns her head, tries to bend forward or look up.  The  patient has pain radiating up into her left ear as well.  The patient  denies any known injury, speech or visual changes, chest pain,  palpitation or extremity weakness.  The patient has not used any  medications for treatment.   PAST MEDICAL HISTORY:  As reviewed.   CURRENT MEDICATIONS:  Are reviewed.   PHYSICAL EXAMINATION:  The patient is a pleasant female in no acute  distress.  She is afebrile with stable vital signs.  O2 saturations 98% on room  air.  HEENT:  Posterior pharynx is clear.  TMs are normal.  Negative mastoid  tenderness.  NECK:  Is supple without cervical adenopathy.  No JVD.  Carotids are  equal with positive upstrokes bilaterally.  No bruits are noted.  LUNGS:  Sounds are clear.  CARDIAC:  Regular rate.  ABDOMEN:  Is soft and nontender.  EXTREMITIES:  Are warm without any edema.  Equal strength of the upper  and lower extremities.  Negative pronator drift.  Normal hand grips  bilaterally.  Slight tremor bilaterally, which is noted to be chronic in  the chart.  Cervical range of motion is normal with reproducible  symptoms increased with right lateral turning.  Patient has tenderness  along the left lateral cervical neck.  The patient does have 2 small  macules along the mid posterior and left side of the neck.   IMPRESSION AND PLAN:  Left-sided neck pain.  Suspected a cervical  strain.  The patient is to continue on ketoprofen as scheduled.  Add in  Skelaxin 800 mg 1/2 to whole tablet up to 3 times a day.  May use  tramadol as needed for pain.  Ice and heat alternating. The patient is  advised if symptoms do not improve or worsen, to contact us immediately.  The patient is advised to return here in 2 weeks for followup.  If  patient's symptoms do not improve over the next several days or worsens  she is to contact us.  The patient does have 2 small lesions on the  posterior neck  which I suspect are insignificant; however, this could be early herpes  zoster and patient is advised to monitor these closely.  If the lesions  spread she is to contact us immediately.      Rubye Oaks, NP  Electronically Signed      Lonzo Cloud. Kriste Basque, MD  Electronically Signed   TP/MedQ  DD: 03/11/2006  DT: 03/11/2006  Job #: [id number]

## 2010-07-06 NOTE — Op Note (Signed)
[name], [name]               ACCOUNT NO.:  [id number]   MEDICAL RECORD NO.:  [id number]          PATIENT TYPE:  OIB   LOCATION:  1621                         FACILITY:  [facility]   PHYSICIAN:  Angelia Mould. Derrell Lolling, M.D.DATE OF BIRTH:  12-07-1923   DATE OF PROCEDURE:  06/10/2005  DATE OF DISCHARGE:                                 OPERATIVE REPORT   PREOPERATIVE DIAGNOSIS:  Ventral incisional hernia.   POSTOPERATIVE DIAGNOSIS:  Ventral incisional hernia.   OPERATION PERFORMED:  Laparoscopic repair of ventral incisional hernia with  Parietex Composite mesh.   SURGEON:  Angelia Mould. Derrell Lolling, M.D.   OPERATIVE INDICATIONS:  This is a 77 year old white female who underwent  uneventful laparoscopic repair of a giant paraesophageal hernia and Nissen  fundoplication and cholecystectomy on January 22, 1994. She has done well  following that surgery. She has noticed a painful bulge above her umbilicus.  On exam, she has a ventral hernia in the midline about 2 cm above the  umbilicus. This was reducible. We have discussed this for about six weeks,  and she is very motivated towards having this repaired because of this  discomfort. I did not find any other hernias.   OPERATIVE TECHNIQUE:  Following induction of general endotracheal  anesthesia, intravenous antibiotics were given. Foley catheter was inserted.  The abdomen was prepped and draped in a sterile fashion. A 10-mm OptiVu port  was placed in the left mid abdomen under direct vision. The insertion was  uneventful. Pneumoperitoneum was created. Video camera was inserted. We  carefully inspected all around the abdomen with particular attention to the  right pericolic gutter and found no evidence of any trocar-related trauma or  bleeding. We placed a 10-mm trocar in the lower midline, a 10-mm trocar in  the upper midline, and ultimately we had to place one of the trocars in the  right mid abdomen. Visualization revealed that she had a ventral  hernia as  described just at and above the umbilicus. There was some omental adhesions  which were taken down sharply. The omentum was not bleeding. We marked the  hernia defect with a spinal needle and found that it was about 4-5 cm in  size. We then measured all around the edges of the defect and got a 3- to 4-  cm margin all the way around and measured this as a temperature plate, and  it was about 13 mm. We brought a 20-cm x 15-cm piece of Parietex Composite  mesh into the operative field. We trimmed the corners a little bit and then  made a template on the abdominal wall and on the mesh for six equidistant  fixation sutures. Six fixation sutures were placed using 0 Novofil. We very  careful to replace the sutures and tie the knots on the rough surface of the  mesh which was going be placed toward the parietal peritoneum. After all of  these sutures were placed and the mesh was marked, the mesh was moistened,  folded and inserted through the 10-mm trocar site. It was then spread out  and positioned.   All six  fixation sutures were brought through the abdominal wall at the  appropriate sites. We were very careful to take at least a 1.0-cm bite of  fascia. After all the fixation sutures were placed, we then lifted them up  and tied them down, and the coverage was quite good without any redundancy.  We further secured the mesh with a 5-mm tacking device all the way around  the periphery, making sure that the tacks were no more than 1 cm apart. This  was inspected from above, below, on the left and on the right on multiple  occasions, and we could not find any areas where we had failed to fix the  mesh at the edges. We placed a few tacks more centrally to fix the mesh to  the abdominal wall. We then checked the repair, checked the omentum and the  intestine. We found no evidence of any bleeding or problems. The trocars  were removed under direct vision. There was no bleeding from trocar  sites.  Pneumoperitoneum was released. Skin incisions were closed with subcuticular  sutures of 4-0 Monocryl and Steri-Strips. Clean bandages were placed. The  patient taken to the recovery room in stable condition. Estimated blood loss  was about 20-30 cc. Complications none. Sponge, needle, and instrument  counts were correct.      Angelia Mould. Derrell Lolling, M.D.  Electronically Signed     HMI/MEDQ  D:  06/10/2005  T:  06/11/2005  Job:  [id number]

## 2010-11-13 ENCOUNTER — Other Ambulatory Visit: Payer: Self-pay | Admitting: Pulmonary Disease

## 2010-11-22 ENCOUNTER — Ambulatory Visit: Payer: Medicare Other | Admitting: Pulmonary Disease

## 2010-12-04 ENCOUNTER — Encounter: Payer: Self-pay | Admitting: Pulmonary Disease

## 2010-12-04 ENCOUNTER — Ambulatory Visit (INDEPENDENT_AMBULATORY_CARE_PROVIDER_SITE_OTHER): Payer: Medicare Other | Admitting: Pulmonary Disease

## 2010-12-04 ENCOUNTER — Encounter: Payer: Self-pay | Admitting: Gastroenterology

## 2010-12-04 DIAGNOSIS — R259 Unspecified abnormal involuntary movements: Secondary | ICD-10-CM

## 2010-12-04 DIAGNOSIS — Z23 Encounter for immunization: Secondary | ICD-10-CM

## 2010-12-04 DIAGNOSIS — M810 Age-related osteoporosis without current pathological fracture: Secondary | ICD-10-CM

## 2010-12-04 DIAGNOSIS — E7800 Pure hypercholesterolemia, unspecified: Secondary | ICD-10-CM

## 2010-12-04 DIAGNOSIS — I872 Venous insufficiency (chronic) (peripheral): Secondary | ICD-10-CM

## 2010-12-04 DIAGNOSIS — M159 Polyosteoarthritis, unspecified: Secondary | ICD-10-CM

## 2010-12-04 DIAGNOSIS — I10 Essential (primary) hypertension: Secondary | ICD-10-CM

## 2010-12-04 DIAGNOSIS — R609 Edema, unspecified: Secondary | ICD-10-CM

## 2010-12-04 DIAGNOSIS — K449 Diaphragmatic hernia without obstruction or gangrene: Secondary | ICD-10-CM

## 2010-12-04 DIAGNOSIS — F411 Generalized anxiety disorder: Secondary | ICD-10-CM

## 2010-12-04 MED ORDER — PRAVASTATIN SODIUM 40 MG PO TABS: 40.0000 mg | ORAL_TABLET | Freq: Every evening | ORAL | Status: DC

## 2010-12-04 MED ORDER — METOPROLOL SUCCINATE ER 50 MG PO TB24: ORAL_TABLET | ORAL | Status: DC

## 2010-12-04 MED ORDER — PRIMIDONE 50 MG PO TABS: 50.0000 mg | ORAL_TABLET | Freq: Three times a day (TID) | ORAL | Status: DC

## 2010-12-04 MED ORDER — DILTIAZEM HCL ER 180 MG PO CP24: 180.0000 mg | ORAL_CAPSULE | Freq: Every day | ORAL | Status: DC

## 2010-12-04 MED ORDER — LOSARTAN POTASSIUM-HCTZ 50-12.5 MG PO TABS: 1.0000 | ORAL_TABLET | Freq: Every day | ORAL | Status: DC

## 2010-12-04 MED ORDER — OMEPRAZOLE 20 MG PO CPDR: 20.0000 mg | DELAYED_RELEASE_CAPSULE | Freq: Two times a day (BID) | ORAL | Status: DC

## 2010-12-04 NOTE — Progress Notes (Signed)
Subjective:    Patient ID: [name], female    DOB: August 17, 1923, 77 y.o.   MRN: [id number]  HPI  77 y/o WF here for a follow up visit... she has mult med problems including:  HBP;  Cardiac arrhythmia;  VV/ Ven IOnsuffic;  Hyperchol;  HH- s/p nissen/ Constip;  UTIs;  DJD;  Osteoporosis;  Tremor;  Anxiety;  Hx of anemia...  ~  November 15, 2009:  overall doing well & still working... she notes "growth" on neck near suprasternal notch- exam shows prom sternoclavic heads bilat, sl tender, & ?cyst in soft tissue (thyroid is normal)... she also notes a sore in her right nares that continues to bother her & we discussed ENT referral to get this checked out... BP remains controlled, denies CP/ palpit/ ch in SOB/ etc;  she continues on the Boniva, calcium, MVI, & Vit D... takes Tramadol for pain & Primadone for tremor... she brought meds today & reviewed w/ pt- OK refills & Flu shot today.  ~  May 18, 2010:  6 month ROV & she tells me that she had to quit work due to her knees & LBP;  She saw DrRendall for Ortho & had MRI "it's worse than I thought" & no surg option per DrRendall, takes Tramadol & Tylenol;  she [facility] & he gave her Methocarbamol as needed- slightly better she says...    HBP>  On Metoprolol, Diltiazem & Hyzaar;  BP= 110/60, taking meds regularly & tol well;  Denies CP, palpit, dizzy, SOB, edema, etc...    Chol>  On diet alone;  FLP shows TChol 238, TG 121, HDL 55, LDL 158; she has repeatedly refused med Rx...    GI>  Hx HH s/p nissen & constip on Omep, Miralax, Senakot-S;  Doing well w/o heartburn, abd pain, swelling, n/v, change in BMs etc...    Osteop>  On Alendronate, calcium, MVI, Vit D; 2000 u daily;  Last BMD 6/11 reviewed w/ TScore -2.6 in right FemNeck...    Tremor> w/ head titubations on Primadone 50mg  Tid & stable...     Anxiety>  She manages well & doesn't require meds...  ~  December 04, 2010:  29mo ROV> she has been doing well overall- just c/o some  paresthesias in toes at night & some incr DOE but she is too sedentary & encouraged to incr exercise program...    HBP>  On MetoprololER25, Diltiazem180 & Hyzaar50-12.5;  BP= 110/60, taking meds regularly & tol well;  Denies CP, palpit, dizzy, SOB, edema, etc...    Chol>  On Prav40 now;  FLP 3/12 on diet alone showed TChol 238, TG 121, HDL 55, LDL 158; she agreed to try Prav40 & tol well...    GI>  Hx HH s/p nissen & constip on Omep20Bid, Miralax, Senakot-S;  Doing well w/o heartburn, abd pain, swelling, n/v, change in BMs etc...    Osteop>  On Alendronate, calcium, MVI, Vit D; 2000 u daily;  She uses Tramadol for pain; Last BMD 6/11 reviewed w/ TScore -2.6 in right FemNeck...    Tremor> w/ head titubations on Primadone 50mg  Tid & stable...     Anxiety>  She manages well & doesn't require meds...    Anemia> on Fe daily; Hg 3/12 was 12.8.Marland KitchenMarland Kitchen   Problem List:  HYPERTENSION (ICD-401.9) - controlled on TOPROL XL 50- 1/2 tab daily, HYZAAR 50/d, and DILTIAZEM 180mg /d...  ~  BP under good control, tolerates meds well; & denies HA, visual changes, CP,  palipit, dizziness, syncope, dyspnea, edema, etc...  Hx of CARDIAC ARRHYTHMIA (ICD-427.9) - hx VTach in past, it was exercise induced... eval by DrBBrodie prev- normal cath... stable on meds... last seen 9/05 for pre-op clearance... ~  NuclearStressTest 9/05 showed no scar or ischemia, EF=70%, some mild ectopy...  VENOUS INSUFFICIENCY (ICD-459.81) - she has mild intermittent edema- treated w/ low sodium diet, elevation, support hose...  HYPERCHOLESTEROLEMIA, MILD (ICD-272.0) - prev on diet alone; PRAVASTATIN 40mg /d started 4/12. ~  FLP 3/10 showed TChol 204, TG 116, HDL 46, LDL 134... she prefers diet Rx. ~  FLP 4/11 showed TChol 206, TG 117, HDL 48, LDL 142... offered meds, she prefers diet. ~  FLP 4/12 on diet alone showed TChol 238, TG 121, HDL 55, LDL 158... rec to try Prav40.  HIATAL HERNIA (ICD-553.3) - on OMEPRAZOLE 20mg Bid... doing well w/  intermittent reflux symptoms, but no dysphagia, etc... she is s/p nissen fundoplication for a large HH... ~  last EGD 3/08 showed prev HH surg, mild stricture, some gastritis...  CONSTIPATION, CHRONIC (ICD-564.09) - on Miralax, & Senakot-S Prn for constip... ~  last colonoscopy 9/00 showed marked tortuous redundant colon, ? adhesions...   Hx of URINARY TRACT INFECTION (ICD-599.0) - she has some bladder symptoms- going q2-3H during the night, but is apparently OK during the day... she refuses urology referral & doesn't want medication trial...  DEGENERATIVE JOINT DISEASE, GENERALIZED (ICD-715.00) - on TRAMADOL 50mg  Prn (using about 2/d)... LBP eval by DrMortenson w/ shot in her knee, she says...   OSTEOPOROSIS (ICD-733.00) - prev on Fosamax but pt DC'd this w/ prev GIB... BMD 3/09 showed TScores -1.6 in Spine, -2.4 in [facility]... started BONIVA 150mg /mo and tolerating this satis- just c/o $$$ in the donut hole... also rec to take CALTRATE Bid and MVI... ~  labs 3/10 showed Vit D = 28... rec OTC Vit D supplement 1000 u daily. ~  labs 3/11 showed Vit D = 24... rec incr to 2000 u vit D daily... ~  Labs 4/12 showed Vit D = 47... Stable on 2000u daily.  TREMOR (ICD-781.0) - she has head titubations on PRIMADONE 50mg Tid...  ANXIETY (ICD-300.00)  ANEMIA (ICD-285.9) - she had an Fe defic anemia in 2008 w/ GI eval by DrGessner- neg EGD, neg colon, capsule endoscopy without lesions seen... she takes REPLIVA daily, but insurance won't pay- therefore change to OTC Fe supplement...  ~  labs 3/09 showed Hg= 12.9, and FE= 80... ~  labs 3/10 showed Hg= 13.1, Fe= 52... rec> OTC FeSO4 supplement... ~  labs 3/11 showed Hg= 13.2   Past Surgical History  Procedure Date  . Nissen fundoplication   . Cholecystectomy     Outpatient Encounter Prescriptions as of 12/04/2010  Medication Sig Dispense Refill  . alendronate (FOSAMAX) 70 MG tablet Take 70 mg by mouth every 7 (seven) days. Take with a full glass of  water on an empty stomach.       . calcium-vitamin D (OSCAL 500/200 D-3) 500-200 MG-UNIT per tablet Take 1 tablet by mouth daily.        . Cholecalciferol (VITAMIN D3) 2000 UNITS TABS Take 1 tablet by mouth daily.        Marland Kitchen diltiazem (DILACOR XR) 180 MG 24 hr capsule Take 180 mg by mouth daily.        . Ferrous Sulfate (IRON) 325 (65 FE) MG TABS Take 1 tablet by mouth daily.        Marland Kitchen losartan-hydrochlorothiazide (HYZAAR) 50-12.5 MG per tablet Take 1  tablet by mouth daily.        . metoprolol (TOPROL-XL) 50 MG 24 hr tablet 1/2 daily      . Multiple Vitamins-Minerals (PRESERVISION AREDS 2 PO) Take 2 capsules by mouth daily.        Marland Kitchen omeprazole (PRILOSEC) 20 MG capsule Take 20 mg by mouth 2 (two) times daily before lunch and supper.        . pravastatin (PRAVACHOL) 40 MG tablet Take 1 tablet (40 mg total) by mouth every evening.  30 tablet  11  . primidone (MYSOLINE) 50 MG tablet Take 1 tablet (50 mg total) by mouth 3 (three) times daily.  90 tablet  5  . primidone (MYSOLINE) 50 MG tablet Take 1 tablet (50 mg total) by mouth 3 (three) times daily.  270 tablet  1  . traMADol (ULTRAM) 50 MG tablet TAKE ONE TABLET BY MOUTH THREE TIMES DAILY AS NEEDED FOR PAIN  90 tablet  5    No Known Allergies   Current Medications, Allergies, Past Medical History, Past Surgical History, Family History, and Social History were reviewed in [employer] record.   Review of Systems        See HPI - all other systems neg except as noted...  The patient complains of decreased hearing and dyspnea on exertion.  The patient denies anorexia, fever, weight loss, weight gain, vision loss, hoarseness, chest pain, syncope, peripheral edema, prolonged cough, headaches, hemoptysis, abdominal pain, melena, hematochezia, severe indigestion/heartburn, hematuria, incontinence, muscle weakness, suspicious skin lesions, transient blindness, difficulty walking, depression, unusual weight change, abnormal bleeding,  enlarged lymph nodes, and angioedema.     Objective:   Physical Exam      WD, WN, 77 y/o WF in NAD... GENERAL:  Alert & oriented; pleasant & cooperative... HEENT:  [place]/AT, EOM-wnl, PERRLA, EACs-clear, TMs-wnl, NOSE-clear, THROAT-clear & wnl. NECK:  Supple w/ fairROM; no JVD; normal carotid impulses w/o bruits; no thyromegaly or nodules palpated; no lymphadenopathy. CHEST:  Clear to P & A; without wheezes/ rales/ or rhonchi. HEART:  Regular Rhythm; without murmurs/ rubs/ or gallops. ABDOMEN:  Soft & nontender; normal bowel sounds; no organomegaly or masses detected. EXT: without deformities, mod arthritic changes; no varicose veins/ +venous insuffic/ no edema. NEURO:  CN's intact;  no focal neuro deficits... DERM:  No lesions noted; no rash etc...   Assessment & Plan:   HBP>  On MetoprololER25, Diltiazem180 & Hyzaar50-12.5;  BP controlled on meds, continue same!     Chol>  On Prav40 now;  FLP 3/12 on diet alone showed TChol 238, TG 121, HDL 55, LDL 158; she agreed to try Prav40 & tol well...     GI>  Hx HH s/p nissen & constip on Omep20Bid, Miralax, Senakot-S;  Doing well w/o heartburn, abd pain, swelling, n/v, change in BMs etc...     Osteop>  On Alendronate, calcium, MVI, Vit D; 2000 u daily;  She uses Tramadol for pain; Last BMD 6/11 reviewed w/ TScore -2.6 in right FemNeck...     Tremor> w/ head titubations on Primadone 50mg  Tid & stable...      Anxiety>  She manages well & doesn't require meds...     Anemia> on Fe daily; Hg 3/12 was 12.8.Marland KitchenMarland Kitchen

## 2010-12-04 NOTE — Patient Instructions (Signed)
Today we updated your med list in EPIC... Thanks for bring the pill bottles to the visit today.    We refilled the meds you requested for 90D supplies...    Please be sure that you are taking your meds regularly; your Pravastatin pill count was "off" & you need to take this regularly (we will plan to follow up your cholesterol blood test on return)...  Be sure to eliminate all the salt/ sodium you can> see the 2 gram sodium diet sheet...  We will arrange for a GI follow up appt to check your swallowing & the intermittent choking you described...    Continue the Omeprazole twice daily (30 min before meals is best)...  Call for any questions...  We gave you the 2012 Flu vaccine today...  Let's plan a follow up visit w/ fasting blood work in 4-36months.Marland KitchenMarland Kitchen

## 2010-12-06 ENCOUNTER — Ambulatory Visit (INDEPENDENT_AMBULATORY_CARE_PROVIDER_SITE_OTHER): Payer: Medicare Other | Admitting: Gastroenterology

## 2010-12-06 ENCOUNTER — Other Ambulatory Visit (INDEPENDENT_AMBULATORY_CARE_PROVIDER_SITE_OTHER): Payer: Medicare Other

## 2010-12-06 ENCOUNTER — Encounter: Payer: Self-pay | Admitting: Gastroenterology

## 2010-12-06 ENCOUNTER — Telehealth: Payer: Self-pay | Admitting: *Deleted

## 2010-12-06 DIAGNOSIS — R1310 Dysphagia, unspecified: Secondary | ICD-10-CM

## 2010-12-06 DIAGNOSIS — K219 Gastro-esophageal reflux disease without esophagitis: Secondary | ICD-10-CM

## 2010-12-06 DIAGNOSIS — D509 Iron deficiency anemia, unspecified: Secondary | ICD-10-CM

## 2010-12-06 DIAGNOSIS — Z79899 Other long term (current) drug therapy: Secondary | ICD-10-CM

## 2010-12-06 LAB — CBC WITH DIFFERENTIAL/PLATELET
Eosinophils Relative: 2.4 % (ref 0.0–5.0)
HCT: 37.6 % (ref 36.0–46.0)
Hemoglobin: 13 g/dL (ref 12.0–15.0)
Lymphs Abs: 1.5 10*3/uL (ref 0.7–4.0)
MCV: 97.4 fl (ref 78.0–100.0)
Monocytes Absolute: 0.5 10*3/uL (ref 0.1–1.0)
Monocytes Relative: 6.9 % (ref 3.0–12.0)
Neutro Abs: 4.5 10*3/uL (ref 1.4–7.7)
Platelets: 275 10*3/uL (ref 150.0–400.0)
RDW: 13.4 % (ref 11.5–14.6)

## 2010-12-06 LAB — FOLATE: Folate: 19.4 ng/mL (ref >5.9)

## 2010-12-06 LAB — IBC PANEL
Iron: 84 ug/dL (ref 42–145)
Transferrin: 160.2 mg/dL — ABNORMAL LOW (ref 212.0–360.0)

## 2010-12-06 MED ORDER — DEXLANSOPRAZOLE 60 MG PO CPDR: 60.0000 mg | DELAYED_RELEASE_CAPSULE | Freq: Every day | ORAL | Status: DC

## 2010-12-06 NOTE — Progress Notes (Signed)
This is a nice 77 year old Caucasian female status post repair of an paraesophageal hernia in 2005, repair of a ventral hernia with mesh in 2007. She has chronic iron deficiency of unexplained etiology, and had a negative small bowel camera exam. She is continued on iron replacement therapy and twice a day Prilosec 20 mg. She has continued regurgitation, and has had progressive solid food dysphagia over the last year without burning substernal pain. She denies any specific hepatobiliary or lower gastrointestinal issues. Her appetite is good her weight has been stable, and she denies systemic complaints. The patient does have hypertensive cardiovascular disease and hyperlipidemia. She is followed medically by Dr. Alroy Dust. She denies use of alcohol, cigarettes, or NSAIDs. Last endoscopy was in 2008. Colonoscopy exam in 2000 that was incomplete because of a markedly redundant and tortuous colon, and post barium enema was unremarkable without any evidence of proximal colon lesions. The patient currently denies constipation, rectal bleeding, or lower abdominal pain.  Current Medications, Allergies, Past Medical History, Past Surgical History, Family History and Social History were reviewed in [employer] record.  Pertinent Review of Systems Negative.. she does have decreased exercise tolerance and shortness of breath with exertion, but denies other cardiovascular complaints.  Past Medical History  Diagnosis Date  . Hypertension   . Cardiac dysrhythmia, unspecified   . Venous insufficiency   . Pure hypercholesterolemia   . Hiatal hernia   . Chronic constipation   . Urinary tract infection, site not specified   . Generalized osteoarthrosis, unspecified site   . Osteoporosis   . Abnormal involuntary movements   . Anxiety state, unspecified   . Anemia   . DJD (degenerative joint disease)   . Tortuous colon   . Pelvic adhesions   . Esophageal stricture   . Postgastrectomy  syndrome    Past Surgical History  Procedure Date  . Nissen fundoplication   . Cholecystectomy     reports that she has never smoked. She has never used smokeless tobacco. She reports that she does not drink alcohol or use illicit drugs. family history includes Cancer in her mother and Heart disease in her brother. No Known Allergies    Physical Exam: Awake and alert no acute distress appearing her stated age. Chest is clear without wheezes or rhonchi. She appears to be a regular rhythm without murmurs gallops or rubs. There is no abdominal distention, organomegaly, masses or tenderness. Bowel sounds are normal. Peripheral extremities are unremarkable mental status is normal.    Assessment and Plan: Chronic GERD and probable recurrent peptic stricture . Changed her from Prilosec to Dexilant 60 mg a day, I have reviewed antireflux regime. I have scheduled her for endoscopy and possible dilatation with propofol sedation or her multiple cardiovascular issues. We'll repeat her CBC and anemia profile. She is to continue other medications as listed and reviewed. Encounter Diagnosis  Name Primary?  Marland Kitchen Dysphagia Yes

## 2010-12-06 NOTE — Patient Instructions (Signed)
You have been scheduled for an Endoscopy with propofol on 12/17/10 please see written instructions given to you at your visit. Please go to the basement for lab work today before leaving. We have given you samples of Dexilant for you to take once daily in place of Prilosec.

## 2010-12-06 NOTE — Telephone Encounter (Signed)
I have left a voicemail for patient to call back. 

## 2010-12-06 NOTE — Telephone Encounter (Signed)
I have spoken with patient to advise that Dr Jarold Motto would like her to begin b12 injections. Patient will come for 1st injection tomorrow.

## 2010-12-06 NOTE — Telephone Encounter (Signed)
Message copied by Richardson Chiquito on Thu Dec 06, 2010  1:55 PM ------      Message from: Jarold Motto, DAVID R      Created: Thu Dec 06, 2010 12:38 PM       Start B12 shots and rx

## 2010-12-07 ENCOUNTER — Ambulatory Visit (INDEPENDENT_AMBULATORY_CARE_PROVIDER_SITE_OTHER): Payer: Medicare Other | Admitting: Gastroenterology

## 2010-12-07 DIAGNOSIS — E538 Deficiency of other specified B group vitamins: Secondary | ICD-10-CM

## 2010-12-07 MED ORDER — CYANOCOBALAMIN 1000 MCG/ML IJ SOLN
1000.0000 ug | INTRAMUSCULAR | Status: DC
  Administered 2011-01-21 – 2011-08-05 (×6): 1000 ug via INTRAMUSCULAR

## 2010-12-07 MED ORDER — CYANOCOBALAMIN 1000 MCG/ML IJ SOLN
1000.0000 ug | INTRAMUSCULAR | Status: AC
  Administered 2010-12-07 – 2010-12-21 (×3): 1000 ug via INTRAMUSCULAR

## 2010-12-13 ENCOUNTER — Encounter: Payer: Self-pay | Admitting: Pulmonary Disease

## 2010-12-14 ENCOUNTER — Ambulatory Visit (INDEPENDENT_AMBULATORY_CARE_PROVIDER_SITE_OTHER): Payer: Medicare Other | Admitting: Gastroenterology

## 2010-12-14 DIAGNOSIS — D509 Iron deficiency anemia, unspecified: Secondary | ICD-10-CM

## 2010-12-14 DIAGNOSIS — E538 Deficiency of other specified B group vitamins: Secondary | ICD-10-CM

## 2010-12-16 ENCOUNTER — Encounter: Payer: Self-pay | Admitting: Pulmonary Disease

## 2010-12-17 ENCOUNTER — Ambulatory Visit (AMBULATORY_SURGERY_CENTER): Payer: Medicare Other | Admitting: Gastroenterology

## 2010-12-17 ENCOUNTER — Encounter: Payer: Self-pay | Admitting: Gastroenterology

## 2010-12-17 DIAGNOSIS — R1314 Dysphagia, pharyngoesophageal phase: Secondary | ICD-10-CM | POA: Insufficient documentation

## 2010-12-17 DIAGNOSIS — K219 Gastro-esophageal reflux disease without esophagitis: Secondary | ICD-10-CM | POA: Insufficient documentation

## 2010-12-17 DIAGNOSIS — K469 Unspecified abdominal hernia without obstruction or gangrene: Secondary | ICD-10-CM | POA: Insufficient documentation

## 2010-12-17 DIAGNOSIS — R1310 Dysphagia, unspecified: Secondary | ICD-10-CM

## 2010-12-17 DIAGNOSIS — D509 Iron deficiency anemia, unspecified: Secondary | ICD-10-CM

## 2010-12-17 MED ORDER — SODIUM CHLORIDE 0.9 % IV SOLN: 500.0000 mL | INTRAVENOUS | Status: DC

## 2010-12-17 NOTE — Patient Instructions (Signed)
Please refer to your blue and neon green sheets for instructions regarding diet and activity for the rest of today.  You may resume your medications as you would normally take them.  

## 2010-12-18 ENCOUNTER — Telehealth: Payer: Self-pay | Admitting: *Deleted

## 2010-12-18 NOTE — Telephone Encounter (Signed)
Follow up Call- Patient questions:  Do you have a fever, pain , or abdominal swelling? no Pain Score  0 *  Have you tolerated food without any problems? no  Have you been able to return to your normal activities? yes  Do you have any questions about your discharge instructions: Diet   yes Medications  no Follow up visit  no  Do you have questions or concerns about your Care? no  Actions: * If pain score is 4 or above: No action needed, pain <4.  Questions about diet the nurse gave her daughter.  Went over instructions with her again on procedure report and she states she has the diet the nurse gave her in recovery to follow and that her daughter was helping Her with the diet instructions. Informed her to call us back if she had any further questions

## 2010-12-21 ENCOUNTER — Ambulatory Visit (INDEPENDENT_AMBULATORY_CARE_PROVIDER_SITE_OTHER): Payer: Medicare Other | Admitting: Gastroenterology

## 2010-12-21 DIAGNOSIS — E538 Deficiency of other specified B group vitamins: Secondary | ICD-10-CM

## 2010-12-28 ENCOUNTER — Telehealth: Payer: Self-pay | Admitting: Pulmonary Disease

## 2010-12-28 NOTE — Telephone Encounter (Signed)
Called and spoke with pt and explained that we did send in refills for this med in oct.  Pt stated that the pharmacy told her they dont have any refills for her.  i called and spoke with [facility] and she did find where this rx was sent in with refills. They will get this filled for the pt.

## 2011-01-21 ENCOUNTER — Ambulatory Visit (INDEPENDENT_AMBULATORY_CARE_PROVIDER_SITE_OTHER): Payer: Medicare Other | Admitting: Gastroenterology

## 2011-01-21 DIAGNOSIS — E538 Deficiency of other specified B group vitamins: Secondary | ICD-10-CM

## 2011-01-21 MED ORDER — DEXLANSOPRAZOLE 60 MG PO CPDR: 60.0000 mg | DELAYED_RELEASE_CAPSULE | Freq: Every day | ORAL | Status: DC

## 2011-01-30 ENCOUNTER — Telehealth: Payer: Self-pay | Admitting: Gastroenterology

## 2011-01-30 MED ORDER — AMBULATORY NON FORMULARY MEDICATION: Status: DC

## 2011-01-30 NOTE — Telephone Encounter (Signed)
Pt with hx of Dysphagia, Repair of Paraesophageal Hernia in 2005, Ventral Hernia with mesh in 2007, Chronic Iron Def. Anemia of unknown Etiology, - Small Bowel Camer Exam. Last Colon 2000 incomplete d/t redundant and tortuous Colon. Last OV 12/06/10 leading to EGD on 12/17/10 : anatomical distortion from extensive prior surgery, no stricture or tumor, food retained; she was to follow dysphagia 3 diet and take Dexilant daily. Pt reports she takes her dexilant daily and follows the diet as ordered. Last pm she ate some ice cream and then went to bed. She threw up the ice cream then had dark black loose stools. The diarrhea and vomiting continued about 2 hours until " everything she has eaten in this week came back"; she denies coffee ground like emesis, but stated she threw up the tomato soup- BLOOD? Today,  pt has only eaten a banana and applesauce, but she seems fine per pt. Please advise. IFOB cards? Thanks.

## 2011-01-30 NOTE — Telephone Encounter (Signed)
Prn gi cocktail .Marland KitchenMarland Kitchen

## 2011-01-30 NOTE — Telephone Encounter (Signed)
Informed pt to advance her diet to dysphagia 3 and I will order the Gi Cocktail; pt stated understanding.

## 2011-01-31 ENCOUNTER — Other Ambulatory Visit: Payer: Self-pay | Admitting: Gastroenterology

## 2011-01-31 NOTE — Telephone Encounter (Signed)
rx already changed over

## 2011-02-04 ENCOUNTER — Other Ambulatory Visit: Payer: Self-pay | Admitting: Pulmonary Disease

## 2011-02-06 ENCOUNTER — Other Ambulatory Visit: Payer: Self-pay | Admitting: Allergy

## 2011-02-22 ENCOUNTER — Ambulatory Visit (INDEPENDENT_AMBULATORY_CARE_PROVIDER_SITE_OTHER): Payer: Medicare Other | Admitting: Gastroenterology

## 2011-02-22 DIAGNOSIS — E538 Deficiency of other specified B group vitamins: Secondary | ICD-10-CM

## 2011-03-28 ENCOUNTER — Ambulatory Visit (INDEPENDENT_AMBULATORY_CARE_PROVIDER_SITE_OTHER): Payer: Medicare Other | Admitting: Gastroenterology

## 2011-03-28 DIAGNOSIS — E538 Deficiency of other specified B group vitamins: Secondary | ICD-10-CM

## 2011-04-26 ENCOUNTER — Ambulatory Visit (INDEPENDENT_AMBULATORY_CARE_PROVIDER_SITE_OTHER): Payer: Medicare Other | Admitting: Gastroenterology

## 2011-04-26 DIAGNOSIS — D509 Iron deficiency anemia, unspecified: Secondary | ICD-10-CM

## 2011-04-26 DIAGNOSIS — E538 Deficiency of other specified B group vitamins: Secondary | ICD-10-CM

## 2011-04-26 NOTE — Progress Notes (Signed)
b12

## 2011-05-10 ENCOUNTER — Ambulatory Visit (INDEPENDENT_AMBULATORY_CARE_PROVIDER_SITE_OTHER): Payer: Medicare Other | Admitting: Pulmonary Disease

## 2011-05-10 ENCOUNTER — Encounter: Payer: Self-pay | Admitting: Pulmonary Disease

## 2011-05-10 VITALS — BP 132/78 | HR 59 | Temp 96.8°F | Ht 59.0 in | Wt 147.8 lb

## 2011-05-10 DIAGNOSIS — I872 Venous insufficiency (chronic) (peripheral): Secondary | ICD-10-CM

## 2011-05-10 DIAGNOSIS — M810 Age-related osteoporosis without current pathological fracture: Secondary | ICD-10-CM

## 2011-05-10 DIAGNOSIS — E7800 Pure hypercholesterolemia, unspecified: Secondary | ICD-10-CM

## 2011-05-10 DIAGNOSIS — K5909 Other constipation: Secondary | ICD-10-CM

## 2011-05-10 DIAGNOSIS — K449 Diaphragmatic hernia without obstruction or gangrene: Secondary | ICD-10-CM

## 2011-05-10 DIAGNOSIS — I499 Cardiac arrhythmia, unspecified: Secondary | ICD-10-CM

## 2011-05-10 DIAGNOSIS — D649 Anemia, unspecified: Secondary | ICD-10-CM

## 2011-05-10 DIAGNOSIS — K219 Gastro-esophageal reflux disease without esophagitis: Secondary | ICD-10-CM

## 2011-05-10 DIAGNOSIS — I10 Essential (primary) hypertension: Secondary | ICD-10-CM

## 2011-05-10 DIAGNOSIS — R1310 Dysphagia, unspecified: Secondary | ICD-10-CM

## 2011-05-10 DIAGNOSIS — M159 Polyosteoarthritis, unspecified: Secondary | ICD-10-CM

## 2011-05-10 DIAGNOSIS — R259 Unspecified abnormal involuntary movements: Secondary | ICD-10-CM

## 2011-05-10 DIAGNOSIS — F411 Generalized anxiety disorder: Secondary | ICD-10-CM

## 2011-05-10 MED ORDER — TRAMADOL HCL 50 MG PO TABS: 50.0000 mg | ORAL_TABLET | Freq: Three times a day (TID) | ORAL | Status: DC | PRN

## 2011-05-10 NOTE — Patient Instructions (Signed)
Today we updated your med list in our EPIC system...    Continue your current medications the same...  Please return to our lab one morning next week for your FASTING blood work...    We will call you w/ the results when avail...  Stay as active as possible, & keep up the good work w/ diet!!!  Call for any questions....  Let's plan a follow up visit in 6 months, sooner if needed for problems.Marland KitchenMarland Kitchen

## 2011-05-10 NOTE — Progress Notes (Addendum)
Subjective:    Patient ID: [name], female    DOB: 07-Dec-1923, 78 y.o.   MRN: [id number]  HPI 78 y/o WF here for a follow up visit... she has mult med problems including:  HBP;  Cardiac arrhythmia;  VV/ Ven IOnsuffic;  Hyperchol;  HH- s/p nissen/ Constip;  UTIs;  DJD;  Osteoporosis;  Tremor;  Anxiety;  Hx of anemia...  ~  May 18, 2010:  6 month ROV & she tells me that she had to quit work due to her knees & LBP;  She saw DrRendall for Ortho & had MRI "it's worse than I thought" & no surg option per DrRendall, takes Tramadol & Tylenol;  she had shots from [facility] & he gave her Methocarbamol as needed- slightly better she says...    HBP>  On Metoprolol, Diltiazem & Hyzaar;  BP= 110/60, taking meds regularly & tol well;  Denies CP, palpit, dizzy, SOB, edema, etc...    Chol>  On diet alone;  FLP shows TChol 238, TG 121, HDL 55, LDL 158; she has repeatedly refused med Rx...    GI>  Hx HH s/p nissen & constip on Omep, Miralax, Senakot-S;  Doing well w/o heartburn, abd pain, swelling, n/v, change in BMs etc...    Osteop>  On Alendronate, calcium, MVI, Vit D; 2000 u daily;  Last BMD 6/11 reviewed w/ TScore -2.6 in right FemNeck...    Tremor> w/ head titubations on Primadone 50mg  Tid & stable...     Anxiety>  She manages well & doesn't require meds...  ~  December 04, 2010:  84mo ROV> she has been doing well overall- just c/o some paresthesias in toes at night & some incr DOE but she is too sedentary & encouraged to incr exercise program...    HBP>  On MetoprololER25, Diltiazem180 & Hyzaar50-12.5;  BP= 110/60, taking meds regularly & tol well;  Denies CP, palpit, dizzy, SOB, edema, etc...    Chol>  On Prav40 now;  FLP 3/12 on diet alone showed TChol 238, TG 121, HDL 55, LDL 158; she agreed to try Prav40 & tol well...    GI>  Hx HH s/p nissen & constip on Omep20Bid, Miralax, Senakot-S;  Doing well w/o heartburn, abd pain, swelling, n/v, change in BMs etc...    Osteop>  On Alendronate, calcium,  MVI, Vit D; 2000 u daily;  She uses Tramadol for pain; Last BMD 6/11 reviewed w/ TScore -2.6 in right FemNeck...    Tremor> w/ head titubations on Primadone 50mg  Tid & stable...     Anxiety>  She manages well & doesn't require meds...    Anemia> on Fe daily; Hg 3/12 was 12.8...  ~  May 10, 2011:  71mo ROV & she continues stable at 78y/o no new complaints or concerns>     She continues to f/u w/ DrPatterson for GI> seen 10/12 for her dysphagia (s/p repair of paraesoph HH in 2005), GERD, chr anemia (Fe defic w/ neg work-up); she also had ventral hernia repaired 2007; she says she can't swallow anything bigger than a grain of rice, and she has vomiting episodes; he performed another EGD 10/12 for progressive solid food dysphagia> post op changes noted w/ persist lateral wall hernia, friable, food debris noted & inflamm polyp in pyloric channel; he changed her Prilosec to Dexilant 60mg /d & rec freq small meals etc; she says that GI cocktail from [facility] helps some; he continues to give her B12 shots Qmonth...     Other medical  problems are stable> see prob list below; BP controlled on Metop & Hyzaar; denies cardiac arrhythmia on the dialtiazem; legs doing satis on salt restriction & support hose; notes some DJD esp hands which tend to go to sleep; she is back on the Fosamax for her Osteoporosis... LABS 3/13:  FLP- at goals on Prav40;  Chems- wnl;  CBC- wnl w/ Hg=12.4;  TSH=1.73;  VitD=49   Problem List:  HYPERTENSION (ICD-401.9) - controlled on TOPROL XL 50- 1/2 tab daily, HYZAAR 50/d, and DILTIAZEM 180mg /d...  ~  BP under good control, tolerates meds well; & denies HA, visual changes, CP, palipit, dizziness, syncope, dyspnea, edema, etc...  Hx of CARDIAC ARRHYTHMIA (ICD-427.9) - hx VTach in past, it was exercise induced... eval by DrBBrodie prev- normal cath... stable on meds... last seen 9/05 for pre-op clearance... ~  NuclearStressTest 9/05 showed no scar or ischemia, EF=70%, some mild  ectopy...  VENOUS INSUFFICIENCY (ICD-459.81) - she has mild intermittent edema- treated w/ low sodium diet, elevation, support hose...  HYPERCHOLESTEROLEMIA, MILD (ICD-272.0) - prev on diet alone; PRAVASTATIN 40mg /d started 4/12. ~  FLP 3/10 showed TChol 204, TG 116, HDL 46, LDL 134... she prefers diet Rx. ~  FLP 4/11 showed TChol 206, TG 117, HDL 48, LDL 142... offered meds, she prefers diet. ~  FLP 4/12 on diet alone showed TChol 238, TG 121, HDL 55, LDL 158... rec to try Prav40. ~  FLP 3/13 on Prav40 showed TChol 139, TG 94, HDL 56, LDL 65... Continue same...  HIATAL HERNIA (ICD-553.3) - on OMEPRAZOLE 20mg Bid... doing well w/ intermittent reflux symptoms, but no dysphagia, etc... she is s/p nissen fundoplication for a large HH... ~  last EGD 3/08 showed prev HH surg, mild stricture, some gastritis...  CONSTIPATION, CHRONIC (ICD-564.09) - on Miralax, & Senakot-S Prn for constip... ~  last colonoscopy 9/00 showed marked tortuous redundant colon, ?adhesions & was incomplete; subseq Ba enema was unremarkable w/o any lesions identified...  Hx of URINARY TRACT INFECTION (ICD-599.0) - she has some bladder symptoms- going q2-3H during the night, but is apparently OK during the day... she refuses urology referral & doesn't want medication trial...  DEGENERATIVE JOINT DISEASE, GENERALIZED (ICD-715.00) - on TRAMADOL 50mg  Prn (using about 2/d)... LBP eval by DrMortenson w/ shot in her knee, she says...   OSTEOPOROSIS (ICD-733.00) - prev on Fosamax but pt DC'd this w/ prev GIB... BMD 3/09 showed TScores -1.6 in Spine, -2.4 in [facility]... started BONIVA 150mg /mo and tolerating this satis- just c/o $$$ in the donut hole... also rec to take CALTRATE Bid and MVI... ~  labs 3/10 showed Vit D = 28... rec OTC Vit D supplement 1000 u daily. ~  labs 3/11 showed Vit D = 24... rec incr to 2000 u vit D daily... ~  Labs 4/12 showed Vit D = 47... Stable on 2000u daily.  TREMOR (ICD-781.0) - she has head titubations  on PRIMADONE 50mg Tid...  ANXIETY (ICD-300.00)  ANEMIA (ICD-285.9) - she had an Fe defic anemia in 2008 w/ GI eval by DrGessner- neg EGD, neg colon, capsule endoscopy without lesions seen... she takes REPLIVA daily, but insurance won't pay- therefore change to OTC Fe supplement...  ~  labs 3/09 showed Hg= 12.9, and FE= 80... ~  labs 3/10 showed Hg= 13.1, Fe= 52... rec> OTC FeSO4 supplement... ~  labs 3/11 showed Hg= 13.2 ~  Labs 3/13 showed Hg= 12.4, MCV= 98   Past Surgical History  Procedure Date  . Nissen fundoplication   . Cholecystectomy   .  Total abdominal hysterectomy   . Colonoscopy   . Upper gastrointestinal endoscopy     Outpatient Encounter Prescriptions as of 05/10/2011  Medication Sig Dispense Refill  . alendronate (FOSAMAX) 70 MG tablet TAKE ONE TABLET BY MOUTH ONCE A WEEK.  4 tablet  11  . AMBULATORY NON FORMULARY MEDICATION Medication Name: GI Cocktail with 20cc Maalox, 10cc Donnatal, 20cc Viscous Lidocaine 2%, take 30cc dose q8hr as needed for GI upset.  1200 mL  0  . calcium-vitamin D (OSCAL 500/200 D-3) 500-200 MG-UNIT per tablet Take 1 tablet by mouth daily.        . Cholecalciferol (VITAMIN D3) 2000 UNITS TABS Take 1 tablet by mouth daily.        Marland Kitchen dexlansoprazole (DEXILANT) 60 MG capsule Take 60 mg by mouth daily.      Marland Kitchen diltiazem (DILACOR XR) 180 MG 24 hr capsule Take 1 capsule (180 mg total) by mouth daily.  90 capsule  3  . Ferrous Sulfate (IRON) 325 (65 FE) MG TABS Take 1 tablet by mouth daily.        Marland Kitchen losartan-hydrochlorothiazide (HYZAAR) 50-12.5 MG per tablet Take 1 tablet by mouth daily.  90 tablet  3  . metoprolol (TOPROL-XL) 50 MG 24 hr tablet 1/2 daily  90 tablet  3  . Multiple Vitamins-Minerals (PRESERVISION AREDS 2 PO) Take 2 capsules by mouth daily.        . pravastatin (PRAVACHOL) 40 MG tablet Take 1 tablet (40 mg total) by mouth every evening.  90 tablet  3  . primidone (MYSOLINE) 50 MG tablet Take 1 tablet (50 mg total) by mouth 3 (three) times  daily.  270 tablet  3  . traMADol (ULTRAM) 50 MG tablet Take 1 tablet (50 mg total) by mouth 3 (three) times daily as needed for pain.  90 tablet  5  . DISCONTD: traMADol (ULTRAM) 50 MG tablet TAKE ONE TABLET BY MOUTH THREE TIMES DAILY AS NEEDED FOR PAIN  90 tablet  5  . DISCONTD: primidone (MYSOLINE) 50 MG tablet Take 1 tablet (50 mg total) by mouth 3 (three) times daily.  270 tablet  1   Facility-Administered Encounter Medications as of 05/10/2011  Medication Dose Route Frequency Provider Last Rate Last Dose  . cyanocobalamin ((VITAMIN B-12)) injection 1,000 mcg  1,000 mcg Intramuscular Q30 days Mardella Layman, MD   1,000 mcg at 04/26/11 0901    No Known Allergies   Current Medications, Allergies, Past Medical History, Past Surgical History, Family History, and Social History were reviewed in [employer] [facility] electronic medical record.   Review of Systems        See HPI - all other systems neg except as noted...  The patient complains of decreased hearing and dyspnea on exertion.  The patient denies anorexia, fever, weight loss, weight gain, vision loss, hoarseness, chest pain, syncope, peripheral edema, prolonged cough, headaches, hemoptysis, abdominal pain, melena, hematochezia, severe indigestion/heartburn, hematuria, incontinence, muscle weakness, suspicious skin lesions, transient blindness, difficulty walking, depression, unusual weight change, abnormal bleeding, enlarged lymph nodes, and angioedema.     Objective:   Physical Exam      WD, WN, 78 y/o WF in NAD... GENERAL:  Alert & oriented; pleasant & cooperative... HEENT:  [place]/AT, EOM-wnl, PERRLA, EACs-clear, TMs-wnl, NOSE-clear, THROAT-clear & wnl. NECK:  Supple w/ fairROM; no JVD; normal carotid impulses w/o bruits; no thyromegaly or nodules palpated; no lymphadenopathy. CHEST:  Clear to P & A; without wheezes/ rales/ or rhonchi. HEART:  Regular Rhythm; without murmurs/  rubs/ or gallops. ABDOMEN:  Soft & nontender; normal  bowel sounds; no organomegaly or masses detected. EXT: without deformities, mod arthritic changes; no varicose veins/ +venous insuffic/ no edema. NEURO:  CN's intact;  no focal neuro deficits... DERM:  No lesions noted; no rash etc...  RADIOLOGY DATA:  Reviewed in the EPIC EMR & discussed w/ the patient...  LABORATORY DATA:  Reviewed in the EPIC EMR & discussed w/ the patient...   Assessment & Plan:   HBP>  On MetoprololER25, Diltiazem180 & Hyzaar50-12.5;  BP controlled on meds, continue same!     Chol>  On Prav40 now;  FLP is much improved & now at goals on the Prav40- continue same...     GI>  Hx HH s/p nissen & constip on Dexilant 60mg /d, Miralax, Senakot-S;  Doing well w/o heartburn, abd pain, n/v, change in BMs etc...     Osteop>  On Alendronate, calcium, MVI, Vit D; 2000 u daily;  She uses Tramadol for pain; Last BMD 6/11 reviewed w/ TScore -2.6 in right FemNeck...     Tremor> w/ head titubations on Primadone 50mg  Tid & stable...      Anxiety>  She manages well & doesn't require meds...     Anemia> on Fe daily; Hg 3/13 was 12.4.Marland KitchenMarland Kitchen

## 2011-05-13 ENCOUNTER — Other Ambulatory Visit (INDEPENDENT_AMBULATORY_CARE_PROVIDER_SITE_OTHER): Payer: Medicare Other

## 2011-05-13 DIAGNOSIS — E7800 Pure hypercholesterolemia, unspecified: Secondary | ICD-10-CM

## 2011-05-13 DIAGNOSIS — D649 Anemia, unspecified: Secondary | ICD-10-CM

## 2011-05-13 DIAGNOSIS — I10 Essential (primary) hypertension: Secondary | ICD-10-CM

## 2011-05-13 DIAGNOSIS — F411 Generalized anxiety disorder: Secondary | ICD-10-CM

## 2011-05-13 DIAGNOSIS — M810 Age-related osteoporosis without current pathological fracture: Secondary | ICD-10-CM

## 2011-05-13 LAB — CBC WITH DIFFERENTIAL/PLATELET
Basophils Absolute: 0 10*3/uL (ref 0.0–0.1)
Eosinophils Absolute: 0.2 10*3/uL (ref 0.0–0.7)
HCT: 36.4 % (ref 36.0–46.0)
Lymphs Abs: 1.2 10*3/uL (ref 0.7–4.0)
MCHC: 34 g/dL (ref 30.0–36.0)
MCV: 98.2 fl (ref 78.0–100.0)
Monocytes Absolute: 0.3 10*3/uL (ref 0.1–1.0)
Neutro Abs: 2.9 10*3/uL (ref 1.4–7.7)
Platelets: 237 10*3/uL (ref 150.0–400.0)
RDW: 13.2 % (ref 11.5–14.6)

## 2011-05-13 LAB — HEPATIC FUNCTION PANEL
AST: 16 U/L (ref 0–37)
Albumin: 3.6 g/dL (ref 3.5–5.2)
Alkaline Phosphatase: 73 U/L (ref 39–117)
Bilirubin, Direct: 0.1 mg/dL (ref 0.0–0.3)
Total Bilirubin: 0.4 mg/dL (ref 0.3–1.2)

## 2011-05-13 LAB — TSH: TSH: 1.73 u[IU]/mL (ref 0.35–5.50)

## 2011-05-13 LAB — BASIC METABOLIC PANEL
GFR: 93.64 mL/min (ref >60.00)
Glucose, Bld: 77 mg/dL (ref 70–99)
Potassium: 3.7 mEq/L (ref 3.5–5.1)
Sodium: 142 mEq/L (ref 135–145)

## 2011-05-14 LAB — LIPID PANEL
LDL Cholesterol: 65 mg/dL (ref 0–99)
Total CHOL/HDL Ratio: 3
Triglycerides: 94 mg/dL (ref 0.0–149.0)

## 2011-06-03 ENCOUNTER — Ambulatory Visit (INDEPENDENT_AMBULATORY_CARE_PROVIDER_SITE_OTHER): Payer: Medicare Other | Admitting: Gastroenterology

## 2011-06-03 DIAGNOSIS — E538 Deficiency of other specified B group vitamins: Secondary | ICD-10-CM

## 2011-07-04 ENCOUNTER — Ambulatory Visit (INDEPENDENT_AMBULATORY_CARE_PROVIDER_SITE_OTHER): Payer: Medicare Other | Admitting: Gastroenterology

## 2011-07-04 DIAGNOSIS — E538 Deficiency of other specified B group vitamins: Secondary | ICD-10-CM

## 2011-08-05 ENCOUNTER — Ambulatory Visit (INDEPENDENT_AMBULATORY_CARE_PROVIDER_SITE_OTHER): Payer: Medicare Other | Admitting: Gastroenterology

## 2011-08-05 DIAGNOSIS — E538 Deficiency of other specified B group vitamins: Secondary | ICD-10-CM

## 2011-08-05 DIAGNOSIS — D519 Vitamin B12 deficiency anemia, unspecified: Secondary | ICD-10-CM

## 2011-08-20 ENCOUNTER — Encounter: Payer: Self-pay | Admitting: Gastroenterology

## 2011-08-20 ENCOUNTER — Ambulatory Visit (INDEPENDENT_AMBULATORY_CARE_PROVIDER_SITE_OTHER): Payer: Medicare Other | Admitting: Gastroenterology

## 2011-08-20 ENCOUNTER — Other Ambulatory Visit (INDEPENDENT_AMBULATORY_CARE_PROVIDER_SITE_OTHER): Payer: Medicare Other

## 2011-08-20 VITALS — BP 132/64 | HR 58 | Ht <= 58 in | Wt 147.0 lb

## 2011-08-20 DIAGNOSIS — D649 Anemia, unspecified: Secondary | ICD-10-CM

## 2011-08-20 DIAGNOSIS — R1314 Dysphagia, pharyngoesophageal phase: Secondary | ICD-10-CM

## 2011-08-20 DIAGNOSIS — Z9889 Other specified postprocedural states: Secondary | ICD-10-CM

## 2011-08-20 DIAGNOSIS — R1032 Left lower quadrant pain: Secondary | ICD-10-CM

## 2011-08-20 DIAGNOSIS — K5730 Diverticulosis of large intestine without perforation or abscess without bleeding: Secondary | ICD-10-CM

## 2011-08-20 DIAGNOSIS — R1310 Dysphagia, unspecified: Secondary | ICD-10-CM

## 2011-08-20 DIAGNOSIS — G8929 Other chronic pain: Secondary | ICD-10-CM

## 2011-08-20 DIAGNOSIS — Z79899 Other long term (current) drug therapy: Secondary | ICD-10-CM

## 2011-08-20 DIAGNOSIS — K219 Gastro-esophageal reflux disease without esophagitis: Secondary | ICD-10-CM

## 2011-08-20 LAB — CBC WITH DIFFERENTIAL/PLATELET
Basophils Absolute: 0 10*3/uL (ref 0.0–0.1)
Hemoglobin: 12.4 g/dL (ref 12.0–15.0)
Lymphocytes Relative: 26.9 % (ref 12.0–46.0)
Monocytes Relative: 8.2 % (ref 3.0–12.0)
Neutro Abs: 3.1 10*3/uL (ref 1.4–7.7)
Platelets: 243 10*3/uL (ref 150.0–400.0)
RDW: 13.3 % (ref 11.5–14.6)
WBC: 5.1 10*3/uL (ref 4.5–10.5)

## 2011-08-20 LAB — FERRITIN: Ferritin: 440.8 ng/mL — ABNORMAL HIGH (ref 10.0–291.0)

## 2011-08-20 LAB — IBC PANEL
Iron: 76 ug/dL (ref 42–145)
Saturation Ratios: 36.3 % (ref 20.0–50.0)

## 2011-08-20 LAB — FOLATE: Folate: 7.3 ng/mL (ref >5.9)

## 2011-08-20 MED ORDER — AMBULATORY NON FORMULARY MEDICATION: Status: DC

## 2011-08-20 MED ORDER — POLYETHYLENE GLYCOL 3350 17 GM/SCOOP PO POWD: 17.0000 g | Freq: Every day | ORAL | Status: DC

## 2011-08-20 NOTE — Progress Notes (Signed)
In error, recording of B12 injection completed 07/04/11 was incomplete. Patient was given b12 injection on 07/04/11 @ 8:30 am.

## 2011-08-20 NOTE — Progress Notes (Signed)
This is a 78 year old Caucasian female with previous repair of a paraesophageal hernia with resultant anatomical deformity of her GE junction confirmed by recent endoscopic exam. She does fairly well on a frequent small feedings low fiber diet. Her main complaint is recurrent spasmodic left lower quadrant pain associated with constipation but no rectal bleeding. Previous barium enemas have shown Mychosis of her sigmoid colon area. Unfortunately she cannot tolerate high fiber foods because of her swallowing difficulties, and uses when necessary GI cocktail and MiraLax with some relief. She has a chronic low-grade iron deficiency, etiology unclear with an extensive negative workup. She currently is on iron therapy but continues to complain of fatigue. She is followed by Dr. Kriste Basque  in primary care.  Current Medications, Allergies, Past Medical History, Past Surgical History, Family History and Social History were reviewed in [employer] record.  Pertinent Review of Systems Negative... chronic tremor treated with Mysoline 50 mg 3 times a day and when necessary Ultram. She also is on Toprol XL and Hyzaar and diltiazem for her cardiovascular problems. Her  acid reflux symptoms are well-controlled with Dexilant 60 mg a day. Patient also is on B12 replacement therapy,. She also suffers from osteoporosis and kyphosis.  Physical Exam: Healthy-appearing patient in no acute distress. Blood pressure 132/64, pulse 58 and regular, weight 147 pounds with BMI of 31.81. Her abdomen shows some mild distention but no definite organomegaly, masses or tenderness. Bowel sounds are not hyperactive. Mental status is clear.    Assessment and Plan:Mychosis syndrome with left lower quadrant severe diverticulosis. She also has anatomical swallowing difficulties as per my recent endoscopic exam. We will continue her current medications, and try her on 4-8 ounces MiraLax regularly at bedtime, and repeat her CBC  and anemia profile. I do not think she needs endoscopic exams at this time or further barium studies. Overall, she appears greatly improved from previous evaluations. She also uses when necessary GI cocktail with good relief of her symptoms. Encounter Diagnosis  Name Primary?  Marland Kitchen Anemia Yes

## 2011-08-20 NOTE — Patient Instructions (Addendum)
Use Miralax 4-8 Oz daily every night at bedtime We are sending in a prescription to your pharmacy You will need to go to the basement today for labs

## 2011-08-27 ENCOUNTER — Other Ambulatory Visit: Payer: Self-pay | Admitting: Gastroenterology

## 2011-11-12 ENCOUNTER — Encounter: Payer: Self-pay | Admitting: Pulmonary Disease

## 2011-11-12 ENCOUNTER — Ambulatory Visit (INDEPENDENT_AMBULATORY_CARE_PROVIDER_SITE_OTHER): Payer: Medicare Other | Admitting: Pulmonary Disease

## 2011-11-12 VITALS — BP 122/78 | HR 61 | Temp 97.3°F | Ht 61.0 in | Wt 149.6 lb

## 2011-11-12 DIAGNOSIS — E7800 Pure hypercholesterolemia, unspecified: Secondary | ICD-10-CM

## 2011-11-12 DIAGNOSIS — I872 Venous insufficiency (chronic) (peripheral): Secondary | ICD-10-CM

## 2011-11-12 DIAGNOSIS — Z23 Encounter for immunization: Secondary | ICD-10-CM

## 2011-11-12 DIAGNOSIS — I499 Cardiac arrhythmia, unspecified: Secondary | ICD-10-CM

## 2011-11-12 DIAGNOSIS — D649 Anemia, unspecified: Secondary | ICD-10-CM

## 2011-11-12 DIAGNOSIS — I10 Essential (primary) hypertension: Secondary | ICD-10-CM

## 2011-11-12 DIAGNOSIS — K5909 Other constipation: Secondary | ICD-10-CM

## 2011-11-12 DIAGNOSIS — M810 Age-related osteoporosis without current pathological fracture: Secondary | ICD-10-CM

## 2011-11-12 DIAGNOSIS — R259 Unspecified abnormal involuntary movements: Secondary | ICD-10-CM

## 2011-11-12 DIAGNOSIS — R1310 Dysphagia, unspecified: Secondary | ICD-10-CM

## 2011-11-12 DIAGNOSIS — M159 Polyosteoarthritis, unspecified: Secondary | ICD-10-CM

## 2011-11-12 DIAGNOSIS — F411 Generalized anxiety disorder: Secondary | ICD-10-CM

## 2011-11-12 DIAGNOSIS — K449 Diaphragmatic hernia without obstruction or gangrene: Secondary | ICD-10-CM

## 2011-11-12 MED ORDER — CYANOCOBALAMIN 1000 MCG/ML IJ SOLN
1000.0000 ug | Freq: Once | INTRAMUSCULAR | Status: AC
  Administered 2011-11-12: 1000 ug via INTRAMUSCULAR

## 2011-11-12 MED ORDER — ALENDRONATE SODIUM 70 MG PO TABS: 70.0000 mg | ORAL_TABLET | ORAL | Status: DC

## 2011-11-12 MED ORDER — TRAMADOL HCL 50 MG PO TABS: 50.0000 mg | ORAL_TABLET | Freq: Three times a day (TID) | ORAL | Status: DC | PRN

## 2011-11-12 NOTE — Patient Instructions (Addendum)
Today we updated your med list in our EPIC system...    Continue your current medications the same...    We refilled the meds you requested...  We gave you the 2013 Flu vaccine today...  We also gave you a B12 shot & set you up for MONTHLY B12 injections thru our 2nd floor clinic...  Call for any problems...  Let's plan a follow up visit in 6 months w/ FASTING blood work at that time.Marland KitchenMarland Kitchen

## 2011-11-12 NOTE — Progress Notes (Signed)
Subjective:    Patient ID: [name], female    DOB: 07-Dec-1923, 78 y.o.   MRN: [id number]  HPI 78 y/o WF here for a follow up visit... she has mult med problems including:  HBP;  Cardiac arrhythmia;  VV/ Ven IOnsuffic;  Hyperchol;  HH- s/p nissen/ Constip;  UTIs;  DJD;  Osteoporosis;  Tremor;  Anxiety;  Hx of anemia...  ~  May 18, 2010:  6 month ROV & she tells me that she had to quit work due to her knees & LBP;  She saw DrRendall for Ortho & had MRI "it's worse than I thought" & no surg option per DrRendall, takes Tramadol & Tylenol;  she had shots from [facility] & he gave her Methocarbamol as needed- slightly better she says...    HBP>  On Metoprolol, Diltiazem & Hyzaar;  BP= 110/60, taking meds regularly & tol well;  Denies CP, palpit, dizzy, SOB, edema, etc...    Chol>  On diet alone;  FLP shows TChol 238, TG 121, HDL 55, LDL 158; she has repeatedly refused med Rx...    GI>  Hx HH s/p nissen & constip on Omep, Miralax, Senakot-S;  Doing well w/o heartburn, abd pain, swelling, n/v, change in BMs etc...    Osteop>  On Alendronate, calcium, MVI, Vit D; 2000 u daily;  Last BMD 6/11 reviewed w/ TScore -2.6 in right FemNeck...    Tremor> w/ head titubations on Primadone 50mg  Tid & stable...     Anxiety>  She manages well & doesn't require meds...  ~  December 04, 2010:  84mo ROV> she has been doing well overall- just c/o some paresthesias in toes at night & some incr DOE but she is too sedentary & encouraged to incr exercise program...    HBP>  On MetoprololER25, Diltiazem180 & Hyzaar50-12.5;  BP= 110/60, taking meds regularly & tol well;  Denies CP, palpit, dizzy, SOB, edema, etc...    Chol>  On Prav40 now;  FLP 3/12 on diet alone showed TChol 238, TG 121, HDL 55, LDL 158; she agreed to try Prav40 & tol well...    GI>  Hx HH s/p nissen & constip on Omep20Bid, Miralax, Senakot-S;  Doing well w/o heartburn, abd pain, swelling, n/v, change in BMs etc...    Osteop>  On Alendronate, calcium,  MVI, Vit D; 2000 u daily;  She uses Tramadol for pain; Last BMD 6/11 reviewed w/ TScore -2.6 in right FemNeck...    Tremor> w/ head titubations on Primadone 50mg  Tid & stable...     Anxiety>  She manages well & doesn't require meds...    Anemia> on Fe daily; Hg 3/12 was 12.8...  ~  May 10, 2011:  71mo ROV & she continues stable at 78y/o no new complaints or concerns>     She continues to f/u w/ DrPatterson for GI> seen 10/12 for her dysphagia (s/p repair of paraesoph HH in 2005), GERD, chr anemia (Fe defic w/ neg work-up); she also had ventral hernia repaired 2007; she says she can't swallow anything bigger than a grain of rice, and she has vomiting episodes; he performed another EGD 10/12 for progressive solid food dysphagia> post op changes noted w/ persist lateral wall hernia, friable, food debris noted & inflamm polyp in pyloric channel; he changed her Prilosec to Dexilant 60mg /d & rec freq small meals etc; she says that GI cocktail from [facility] helps some; he continues to give her B12 shots Qmonth...     Other medical  problems are stable> see prob list below; BP controlled on Metop & Hyzaar; denies cardiac arrhythmia on the dialtiazem; legs doing satis on salt restriction & support hose; notes some DJD esp hands which tend to go to sleep; she is back on the Fosamax for her Osteoporosis... LABS 3/13:  FLP- at goals on Prav40;  Chems- wnl;  CBC- wnl w/ Hg=12.4;  TSH=1.73;  VitD=49  ~  November 12, 2011:  80mo ROV & Susane is stable overall, feeling OK, no new complaints or concerns;  BP controlled on 3 meds and her Lipids have been regulated w/ diet & Prav40...    She saw DrPatterson 7/13> hx paraesoph hernia repair, eats freq sm meals, has LLQ discomfort & severe divertics, on VitB12 shots, Fe & VitD orally + fiber...     We reviewed prob list, meds, xrays and labs> see below for updates >> OK Flu shots today...   Problem List:  HYPERTENSION (ICD-401.9) - controlled on TOPROL XL 50-  1/2 tab daily, HYZAAR 50/d, and DILTIAZEM 180mg /d...  ~  BP under good control, tolerates meds well; & denies HA, visual changes, CP, palipit, dizziness, syncope, dyspnea, edema, etc... ~  9/13:  BP= 122/78 & she denies CP, palpit, SOB, edema...  Hx of CARDIAC ARRHYTHMIA (ICD-427.9) - hx VTach in past, it was exercise induced... eval by DrBBrodie prev- normal cath... stable on meds... last seen 9/05 for pre-op clearance... ~  NuclearStressTest 9/05 showed no scar or ischemia, EF=70%, some mild ectopy...  VENOUS INSUFFICIENCY (ICD-459.81) - she has mild intermittent edema- treated w/ low sodium diet, elevation, support hose...  HYPERCHOLESTEROLEMIA, MILD (ICD-272.0) - prev on diet alone; PRAVASTATIN 40mg /d started 4/12. ~  FLP 3/10 showed TChol 204, TG 116, HDL 46, LDL 134... she prefers diet Rx. ~  FLP 4/11 showed TChol 206, TG 117, HDL 48, LDL 142... offered meds, she prefers diet. ~  FLP 4/12 on diet alone showed TChol 238, TG 121, HDL 55, LDL 158... rec to try Prav40. ~  FLP 3/13 on Prav40 showed TChol 139, TG 94, HDL 56, LDL 65... Continue same...  HIATAL HERNIA (ICD-553.3) - on DEXILANT 60mg /d... doing well w/ intermittent reflux symptoms, but no dysphagia, etc... she is s/p nissen fundoplication for a large HH... ~  last EGD 3/08 showed prev HH surg, mild stricture, some gastritis... ~  Also takes a GI Cocktail as needed per DRP, and B12 shots monthly...  CONSTIPATION, CHRONIC (ICD-564.09) - on Miralax, & Senakot-S Prn for constip... ~  last colonoscopy 9/00 showed marked tortuous redundant colon, ?adhesions & was incomplete; subseq Ba enema was unremarkable w/o any lesions identified...  Hx of URINARY TRACT INFECTION (ICD-599.0) - she has some bladder symptoms- going q2-3H during the night, but is apparently OK during the day... she refuses urology referral & doesn't want medication trial...  DEGENERATIVE JOINT DISEASE, GENERALIZED (ICD-715.00) - on TRAMADOL 50mg  Prn (using about  2/d)... LBP eval by DrMortenson w/ shot in her knee, she says...   OSTEOPOROSIS (ICD-733.00) - prev on Fosamax but pt DC'd this w/ prev GIB... BMD 3/09 showed TScores -1.6 in Spine, -2.4 in [facility]... started BONIVA 150mg /mo and tolerating this satis- just c/o $$$ in the donut hole... also rec to take CALTRATE Bid and MVI... ~  labs 3/10 showed Vit D = 28... rec OTC Vit D supplement 1000 u daily. ~  labs 3/11 showed Vit D = 24... rec incr to 2000 u vit D daily... ~  Labs 4/12 showed Vit D = 47... Stable  on 2000u daily.  TREMOR (ICD-781.0) - she has head titubations on PRIMADONE 50mg Tid...  ANXIETY (ICD-300.00)  ANEMIA (ICD-285.9) - she had an Fe defic anemia in 2008 w/ GI eval by DrGessner- neg EGD, neg colon, capsule endoscopy without lesions seen... she takes REPLIVA daily, but insurance won't pay- therefore change to OTC Fe supplement...  ~  labs 3/09 showed Hg= 12.9, and FE= 80... ~  labs 3/10 showed Hg= 13.1, Fe= 52... rec> OTC FeSO4 supplement... ~  labs 3/11 showed Hg= 13.2 ~  Labs 3/13 showed Hg= 12.4, MCV= 98   Past Surgical History  Procedure Date  . Nissen fundoplication   . Cholecystectomy   . Total abdominal hysterectomy   . Colonoscopy   . Upper gastrointestinal endoscopy     Outpatient Encounter Prescriptions as of 11/12/2011  Medication Sig Dispense Refill  . alendronate (FOSAMAX) 70 MG tablet TAKE ONE TABLET BY MOUTH ONCE A WEEK.  4 tablet  11  . AMBULATORY NON FORMULARY MEDICATION Medication Name: GI Cocktail with 20cc Maalox, 10cc Donnatal, 20cc Viscous Lidocaine 2%, take 30cc dose q8hr as needed for GI upset.  1200 mL  6  . calcium-vitamin D (OSCAL 500/200 D-3) 500-200 MG-UNIT per tablet Take 1 tablet by mouth daily.        . Cholecalciferol (VITAMIN D3) 2000 UNITS TABS Take 1 tablet by mouth daily.        Marland Kitchen DEXILANT 60 MG capsule TAKE ONE CAPSULE BY MOUTH EVERY DAY  30 capsule  3  . diltiazem (DILACOR XR) 180 MG 24 hr capsule Take 1 capsule (180 mg total) by  mouth daily.  90 capsule  3  . Ferrous Sulfate (IRON) 325 (65 FE) MG TABS Take 1 tablet by mouth daily.        Marland Kitchen losartan-hydrochlorothiazide (HYZAAR) 50-12.5 MG per tablet Take 1 tablet by mouth daily.  90 tablet  3  . metoprolol (TOPROL-XL) 50 MG 24 hr tablet 1/2 daily  90 tablet  3  . Multiple Vitamins-Minerals (PRESERVISION AREDS 2 PO) Take 2 capsules by mouth daily.        . pravastatin (PRAVACHOL) 40 MG tablet Take 1 tablet (40 mg total) by mouth every evening.  90 tablet  3  . primidone (MYSOLINE) 50 MG tablet Take 1 tablet (50 mg total) by mouth 3 (three) times daily.  270 tablet  3  . traMADol (ULTRAM) 50 MG tablet Take 1 tablet (50 mg total) by mouth 3 (three) times daily as needed for pain.  90 tablet  5  . dexlansoprazole (DEXILANT) 60 MG capsule Take 1 capsule (60 mg total) by mouth daily.  10 capsule  0  . DISCONTD: dexlansoprazole (DEXILANT) 60 MG capsule Take 60 mg by mouth daily.        No Known Allergies   Current Medications, Allergies, Past Medical History, Past Surgical History, Family History, and Social History were reviewed in [employer] record.   Review of Systems        See HPI - all other systems neg except as noted...  The patient complains of decreased hearing and dyspnea on exertion.  The patient denies anorexia, fever, weight loss, weight gain, vision loss, hoarseness, chest pain, syncope, peripheral edema, prolonged cough, headaches, hemoptysis, abdominal pain, melena, hematochezia, severe indigestion/heartburn, hematuria, incontinence, muscle weakness, suspicious skin lesions, transient blindness, difficulty walking, depression, unusual weight change, abnormal bleeding, enlarged lymph nodes, and angioedema.     Objective:   Physical Exam  WD, WN, 78 y/o WF in NAD... GENERAL:  Alert & oriented; pleasant & cooperative... HEENT:  [place]/AT, EOM-wnl, PERRLA, EACs-clear, TMs-wnl, NOSE-clear, THROAT-clear & wnl. NECK:  Supple w/  fairROM; no JVD; normal carotid impulses w/o bruits; no thyromegaly or nodules palpated; no lymphadenopathy. CHEST:  Clear to P & A; without wheezes/ rales/ or rhonchi. HEART:  Regular Rhythm; without murmurs/ rubs/ or gallops. ABDOMEN:  Soft & nontender; normal bowel sounds; no organomegaly or masses detected. EXT: without deformities, mod arthritic changes; no varicose veins/ +venous insuffic/ no edema. NEURO:  CN's intact;  no focal neuro deficits... DERM:  No lesions noted; no rash etc...  RADIOLOGY DATA:  Reviewed in the EPIC EMR & discussed w/ the patient...  LABORATORY DATA:  Reviewed in the EPIC EMR & discussed w/ the patient...   Assessment & Plan:    HBP>  On MetoprololER25, Diltiazem180 & Hyzaar50-12.5;  BP controlled on meds, continue same!     Chol>  On Prav40 now;  FLP is much improved & now at goals on the Prav40- continue same...     GI>  Hx HH s/p Nissen & constip on Dexilant 60mg /d, Miralax, Senakot-S;  Doing well w/o heartburn, abd pain, n/v, change in BMs etc...     Osteop>  On Alendronate, calcium, MVI, Vit D; 2000 u daily;  She uses Tramadol for pain; Last BMD 6/11 reviewed w/ TScore -2.6 in right FemNeck...     Tremor> w/ head titubations on Primadone 50mg  Tid & stable...      Anxiety>  She manages well & doesn't require meds...     Anemia> on Fe daily; Hg 3/13 was 12.4.Marland KitchenMarland Kitchen   Patient's Medications  New Prescriptions   No medications on file  Previous Medications   AMBULATORY NON FORMULARY MEDICATION    Medication Name: GI Cocktail with 20cc Maalox, 10cc Donnatal, 20cc Viscous Lidocaine 2%, take 30cc dose q8hr as needed for GI upset.   CALCIUM-VITAMIN D (OSCAL 500/200 D-3) 500-200 MG-UNIT PER TABLET    Take 1 tablet by mouth daily.     CHOLECALCIFEROL (VITAMIN D3) 2000 UNITS TABS    Take 1 tablet by mouth daily.     DEXILANT 60 MG CAPSULE    TAKE ONE CAPSULE BY MOUTH EVERY DAY   DEXLANSOPRAZOLE (DEXILANT) 60 MG CAPSULE    Take 1 capsule (60 mg total) by  mouth daily.   DILTIAZEM (DILACOR XR) 180 MG 24 HR CAPSULE    Take 1 capsule (180 mg total) by mouth daily.   FERROUS SULFATE (IRON) 325 (65 FE) MG TABS    Take 1 tablet by mouth daily.     LOSARTAN-HYDROCHLOROTHIAZIDE (HYZAAR) 50-12.5 MG PER TABLET    Take 1 tablet by mouth daily.   METOPROLOL (TOPROL-XL) 50 MG 24 HR TABLET    1/2 daily   MULTIPLE VITAMINS-MINERALS (PRESERVISION AREDS 2 PO)    Take 2 capsules by mouth daily.     PRAVASTATIN (PRAVACHOL) 40 MG TABLET    Take 1 tablet (40 mg total) by mouth every evening.   PRIMIDONE (MYSOLINE) 50 MG TABLET    Take 1 tablet (50 mg total) by mouth 3 (three) times daily.  Modified Medications   Modified Medication Previous Medication   ALENDRONATE (FOSAMAX) 70 MG TABLET alendronate (FOSAMAX) 70 MG tablet      Take 1 tablet (70 mg total) by mouth every 7 (seven) days. Take with a full glass of water on an empty stomach.    TAKE ONE TABLET BY MOUTH ONCE  A WEEK.   TRAMADOL (ULTRAM) 50 MG TABLET traMADol (ULTRAM) 50 MG tablet      Take 1 tablet (50 mg total) by mouth 3 (three) times daily as needed for pain.    Take 1 tablet (50 mg total) by mouth 3 (three) times daily as needed for pain.  Discontinued Medications   DEXLANSOPRAZOLE (DEXILANT) 60 MG CAPSULE    Take 60 mg by mouth daily.

## 2011-12-09 ENCOUNTER — Other Ambulatory Visit: Payer: Self-pay | Admitting: Gastroenterology

## 2011-12-09 ENCOUNTER — Other Ambulatory Visit: Payer: Self-pay | Admitting: Pulmonary Disease

## 2011-12-12 ENCOUNTER — Telehealth: Payer: Self-pay | Admitting: Pulmonary Disease

## 2011-12-12 ENCOUNTER — Ambulatory Visit (INDEPENDENT_AMBULATORY_CARE_PROVIDER_SITE_OTHER): Payer: Medicare Other

## 2011-12-12 DIAGNOSIS — D649 Anemia, unspecified: Secondary | ICD-10-CM

## 2011-12-12 MED ORDER — CYANOCOBALAMIN 1000 MCG/ML IJ SOLN
1000.0000 ug | Freq: Once | INTRAMUSCULAR | Status: AC
  Administered 2011-12-12: 1000 ug via INTRAMUSCULAR

## 2011-12-12 NOTE — Telephone Encounter (Signed)
Spoke with pt and notified will need next b-12 in 4 wks Appt scheduled Nothing further needed

## 2011-12-12 NOTE — Telephone Encounter (Signed)
Per SN last ov with pt on 9/24-- pt was to come in every month for her b12 injection in our office.  i have called and lmomtcb for the pt .

## 2011-12-25 ENCOUNTER — Other Ambulatory Visit: Payer: Self-pay | Admitting: Gastroenterology

## 2011-12-26 ENCOUNTER — Telehealth: Payer: Self-pay | Admitting: Pulmonary Disease

## 2011-12-26 MED ORDER — ISOMETHEPTENE-DICHLORAL-APAP 65-100-325 MG PO CAPS: 1.0000 | ORAL_CAPSULE | Freq: Four times a day (QID) | ORAL | Status: DC | PRN

## 2011-12-26 NOTE — Telephone Encounter (Signed)
The pt states she has had a headache for the past 2 days. The pain was so bad last night that she could not sleep. She has only taken Tramadol for this but it did not help. She wants recs fron SN. She denies any sinus trouble or nasal congestion, no vision disturbance. Pt last seen 11/12/11. Pls advise.No Known Allergies

## 2011-12-26 NOTE — Telephone Encounter (Signed)
Pt is aware of SN recs. She voiced her understanding and will send RX into the pharmacy

## 2011-12-26 NOTE — Telephone Encounter (Signed)
Per SN----for severe HA and not sinus related-- rest, clear liquid diet and call in midrin  #30  1 po every 6 hours as needed for HA---if no change with midrin or HA gets worse then she will need to be seen in the ER for further eval.  thanks

## 2011-12-30 ENCOUNTER — Other Ambulatory Visit: Payer: Self-pay | Admitting: Pulmonary Disease

## 2012-01-08 ENCOUNTER — Telehealth: Payer: Self-pay | Admitting: Pulmonary Disease

## 2012-01-08 NOTE — Telephone Encounter (Signed)
Randell Loop, [facility] 12/26/2011 5:04 PM Signed  Per SN----for severe HA and not sinus related-- rest, clear liquid diet and call in midrin #30 1 po every 6 hours as needed for HA---if no change with midrin or HA gets worse then she will need to be seen in the ER for further eval. thanks  Spoke with [facility]-pt was there this morning for eye exam and aware of Rx SN gave patient.

## 2012-01-09 ENCOUNTER — Ambulatory Visit (INDEPENDENT_AMBULATORY_CARE_PROVIDER_SITE_OTHER): Payer: Medicare Other

## 2012-01-09 DIAGNOSIS — D649 Anemia, unspecified: Secondary | ICD-10-CM

## 2012-01-09 MED ORDER — CYANOCOBALAMIN 1000 MCG/ML IJ SOLN
1000.0000 ug | Freq: Once | INTRAMUSCULAR | Status: AC
  Administered 2012-01-09: 1000 ug via INTRAMUSCULAR

## 2012-02-06 ENCOUNTER — Ambulatory Visit (INDEPENDENT_AMBULATORY_CARE_PROVIDER_SITE_OTHER): Payer: Medicare Other

## 2012-02-06 DIAGNOSIS — D649 Anemia, unspecified: Secondary | ICD-10-CM

## 2012-02-07 DIAGNOSIS — D649 Anemia, unspecified: Secondary | ICD-10-CM

## 2012-02-07 MED ORDER — CYANOCOBALAMIN 1000 MCG/ML IJ SOLN
1000.0000 ug | Freq: Once | INTRAMUSCULAR | Status: AC
  Administered 2012-02-07: 1000 ug via INTRAMUSCULAR

## 2012-03-05 ENCOUNTER — Ambulatory Visit: Payer: Medicare Other

## 2012-03-09 ENCOUNTER — Telehealth: Payer: Self-pay | Admitting: Pulmonary Disease

## 2012-03-09 DIAGNOSIS — M549 Dorsalgia, unspecified: Secondary | ICD-10-CM

## 2012-03-09 NOTE — Telephone Encounter (Signed)
Per SN----this concerns SN.  Pt will need ASAP appt with ortho---[facility] and [facility] for eval of the back pain.  Pt may need ER for eval.  thanks

## 2012-03-09 NOTE — Telephone Encounter (Signed)
i spoke with daughter and isa ware of SN recs. She would like this referral. Pt current ortho doc seems to have no concern over this. I have placed referral and will forward to PCC's so theya re aware for ASAP appt.

## 2012-03-09 NOTE — Telephone Encounter (Signed)
Called, spoke with [place].  States pt started having back pain, pain above her hips, and down her legs with trouble walking approx 1-2 wks ago.  States last Wednesday they called Dr. Alvester Morin has he has given her injections before that helped.  Pt received an injection last Thursday at [facility] and is now worse than she was prior to having it done.  Elnita Maxwell reports pt was up and down all night last night.  Also, states pt c/o feeling there there is something pressing on her kidney.  Elnita Maxwell states pt is taking tramadol and tylenol together with no relief now.  States she tried ice early this am but made it worse and later tried heat which made it worse.  She would like further recs.  Also, states she noticed today that the bottom of pt's feet are purple.  She would like to know if this should be a concern.  Dr. Kriste Basque, pls advise.  Thank you.  [facility] [place].  nkda  Last OV with SN 11/12/11

## 2012-03-10 NOTE — Telephone Encounter (Signed)
appt to see dr Farris Has 03/11/12@10 :30am pt's daughter is aware [name]

## 2012-03-10 NOTE — Telephone Encounter (Signed)
I spoke with daughter and she is wanting status if referral d/t pt being in severe pain. Please advise PCC's thanks

## 2012-03-10 NOTE — Telephone Encounter (Signed)
Pt's daughter states that the orthopedic doctor returned pt's call late yesterday & pt is not able to get another shot until Thursday.  Advised that when she asked for recs, the ortho doc's office stated they can't do anything for her until her next shot on Thursday.  Pt is still in a lot of pain & would like recs if SN has any.  2nd issue:  Pt's daughter states that pt is having a difficult time breathing & would like SN's recs for this as well.  Antionette Fairy

## 2012-03-19 ENCOUNTER — Ambulatory Visit: Payer: Medicare Other

## 2012-03-20 ENCOUNTER — Ambulatory Visit (INDEPENDENT_AMBULATORY_CARE_PROVIDER_SITE_OTHER): Payer: Medicare Other

## 2012-03-20 DIAGNOSIS — D649 Anemia, unspecified: Secondary | ICD-10-CM

## 2012-03-24 MED ORDER — CYANOCOBALAMIN 1000 MCG/ML IJ SOLN
1000.0000 ug | Freq: Once | INTRAMUSCULAR | Status: AC
  Administered 2012-03-24: 1000 ug via INTRAMUSCULAR

## 2012-04-17 ENCOUNTER — Ambulatory Visit (INDEPENDENT_AMBULATORY_CARE_PROVIDER_SITE_OTHER): Payer: Medicare Other

## 2012-04-17 DIAGNOSIS — D649 Anemia, unspecified: Secondary | ICD-10-CM

## 2012-04-17 MED ORDER — CYANOCOBALAMIN 1000 MCG/ML IJ SOLN
1000.0000 ug | Freq: Once | INTRAMUSCULAR | Status: AC
  Administered 2012-04-17: 1000 ug via INTRAMUSCULAR

## 2012-05-11 ENCOUNTER — Ambulatory Visit (INDEPENDENT_AMBULATORY_CARE_PROVIDER_SITE_OTHER): Payer: Medicare Other

## 2012-05-11 ENCOUNTER — Other Ambulatory Visit (INDEPENDENT_AMBULATORY_CARE_PROVIDER_SITE_OTHER): Payer: Medicare Other

## 2012-05-11 ENCOUNTER — Ambulatory Visit (INDEPENDENT_AMBULATORY_CARE_PROVIDER_SITE_OTHER): Payer: Medicare Other | Admitting: Pulmonary Disease

## 2012-05-11 ENCOUNTER — Encounter: Payer: Self-pay | Admitting: Pulmonary Disease

## 2012-05-11 VITALS — BP 116/72 | HR 60 | Temp 97.6°F | Ht 61.0 in | Wt 145.0 lb

## 2012-05-11 DIAGNOSIS — D509 Iron deficiency anemia, unspecified: Secondary | ICD-10-CM

## 2012-05-11 DIAGNOSIS — D649 Anemia, unspecified: Secondary | ICD-10-CM

## 2012-05-11 DIAGNOSIS — R0609 Other forms of dyspnea: Secondary | ICD-10-CM

## 2012-05-11 DIAGNOSIS — F411 Generalized anxiety disorder: Secondary | ICD-10-CM

## 2012-05-11 DIAGNOSIS — M545 Low back pain: Secondary | ICD-10-CM

## 2012-05-11 DIAGNOSIS — R1310 Dysphagia, unspecified: Secondary | ICD-10-CM

## 2012-05-11 DIAGNOSIS — R0989 Other specified symptoms and signs involving the circulatory and respiratory systems: Secondary | ICD-10-CM

## 2012-05-11 DIAGNOSIS — E538 Deficiency of other specified B group vitamins: Secondary | ICD-10-CM

## 2012-05-11 DIAGNOSIS — I10 Essential (primary) hypertension: Secondary | ICD-10-CM

## 2012-05-11 DIAGNOSIS — R259 Unspecified abnormal involuntary movements: Secondary | ICD-10-CM

## 2012-05-11 DIAGNOSIS — M810 Age-related osteoporosis without current pathological fracture: Secondary | ICD-10-CM

## 2012-05-11 DIAGNOSIS — R0600 Dyspnea, unspecified: Secondary | ICD-10-CM

## 2012-05-11 DIAGNOSIS — I499 Cardiac arrhythmia, unspecified: Secondary | ICD-10-CM

## 2012-05-11 DIAGNOSIS — K449 Diaphragmatic hernia without obstruction or gangrene: Secondary | ICD-10-CM

## 2012-05-11 DIAGNOSIS — M159 Polyosteoarthritis, unspecified: Secondary | ICD-10-CM

## 2012-05-11 DIAGNOSIS — E7800 Pure hypercholesterolemia, unspecified: Secondary | ICD-10-CM

## 2012-05-11 DIAGNOSIS — K5909 Other constipation: Secondary | ICD-10-CM

## 2012-05-11 DIAGNOSIS — K219 Gastro-esophageal reflux disease without esophagitis: Secondary | ICD-10-CM

## 2012-05-11 DIAGNOSIS — I872 Venous insufficiency (chronic) (peripheral): Secondary | ICD-10-CM

## 2012-05-11 LAB — CBC WITH DIFFERENTIAL/PLATELET
Basophils Absolute: 0 10*3/uL (ref 0.0–0.1)
Basophils Relative: 0.7 % (ref 0.0–3.0)
Eosinophils Absolute: 0.2 10*3/uL (ref 0.0–0.7)
HCT: 36.5 % (ref 36.0–46.0)
Hemoglobin: 13 g/dL (ref 12.0–15.0)
Lymphs Abs: 1.6 10*3/uL (ref 0.7–4.0)
MCHC: 35.6 g/dL (ref 30.0–36.0)
Monocytes Relative: 8.1 % (ref 3.0–12.0)
Neutro Abs: 3.6 10*3/uL (ref 1.4–7.7)
RBC: 3.77 Mil/uL — ABNORMAL LOW (ref 3.87–5.11)
RDW: 13.7 % (ref 11.5–14.6)

## 2012-05-11 LAB — HEPATIC FUNCTION PANEL
ALT: 10 U/L (ref 0–35)
Total Bilirubin: 0.7 mg/dL (ref 0.3–1.2)

## 2012-05-11 LAB — LIPID PANEL
Cholesterol: 140 mg/dL (ref 0–200)
HDL: 43.7 mg/dL (ref >39.00)
LDL Cholesterol: 59 mg/dL (ref 0–99)
Triglycerides: 185 mg/dL — ABNORMAL HIGH (ref 0.0–149.0)
VLDL: 37 mg/dL (ref 0.0–40.0)

## 2012-05-11 LAB — IBC PANEL
Saturation Ratios: 39.1 % (ref 20.0–50.0)
Transferrin: 157.2 mg/dL — ABNORMAL LOW (ref 212.0–360.0)

## 2012-05-11 LAB — BASIC METABOLIC PANEL
BUN: 12 mg/dL (ref 6–23)
Calcium: 9.3 mg/dL (ref 8.4–10.5)
Chloride: 102 mEq/L (ref 96–112)
Creatinine, Ser: 0.7 mg/dL (ref 0.4–1.2)
GFR: 91.76 mL/min (ref >60.00)

## 2012-05-11 LAB — BRAIN NATRIURETIC PEPTIDE: Pro B Natriuretic peptide (BNP): 102 pg/mL — ABNORMAL HIGH (ref 0.0–100.0)

## 2012-05-11 MED ORDER — TRAMADOL HCL 50 MG PO TABS: 50.0000 mg | ORAL_TABLET | Freq: Three times a day (TID) | ORAL | Status: DC | PRN

## 2012-05-11 NOTE — Progress Notes (Signed)
Subjective:    Patient ID: [name], female    DOB: [birth year], 79 y.o.   MRN: [id number]  HPI 79 y/o WF here for a follow up visit... she has mult med problems including:  HBP;  Cardiac arrhythmia;  VV/ Ven IOnsuffic;  Hyperchol;  HH- s/p nissen/ Constip;  UTIs;  DJD;  Osteoporosis;  Tremor;  Anxiety;  Hx of anemia...  ~  December 04, 2010:  44mo ROV> she has been doing well overall- just c/o some paresthesias in toes at night & some incr DOE but she is too sedentary & encouraged to incr exercise program...    HBP>  On MetoprololER25, Diltiazem180 & Hyzaar50-12.5;  BP= 110/60, taking meds regularly & tol well;  Denies CP, palpit, dizzy, SOB, edema, etc...    Chol>  On Prav40 now;  FLP 3/12 on diet alone showed TChol 238, TG 121, HDL 55, LDL 158; she agreed to try Prav40 & tol well...    GI>  Hx HH s/p nissen & constip on Omep20Bid, Miralax, Senakot-S;  Doing well w/o heartburn, abd pain, swelling, n/v, change in BMs etc...    Osteop>  On Alendronate, calcium, MVI, Vit D; 2000 u daily;  She uses Tramadol for pain; Last BMD 6/11 reviewed w/ TScore -2.6 in right FemNeck...    Tremor> w/ head titubations on Primadone 50mg  Tid & stable...     Anxiety>  She manages well & doesn't require meds...    Anemia> on Fe daily; Hg 3/12 was 12.8...  ~  May 10, 2011:  53mo ROV & she continues stable at 79y/o no new complaints or concerns>     She continues to f/u w/ DrPatterson for GI> seen 10/12 for her dysphagia (s/p repair of paraesoph HH in 2005), GERD, chr anemia (Fe defic w/ neg work-up); she also had ventral hernia repaired 2007; she says she can't swallow anything bigger than a grain of rice, and she has vomiting episodes; he performed another EGD 10/12 for progressive solid food dysphagia> post op changes noted w/ persist lateral wall hernia, friable, food debris noted & inflamm polyp in pyloric channel; he changed her Prilosec to Dexilant 60mg /d & rec freq small meals etc; she says that GI cocktail  from [facility] helps some; he continues to give her B12 shots Qmonth...     Other medical problems are stable> see prob list below; BP controlled on Metop & Hyzaar; denies cardiac arrhythmia on the dialtiazem; legs doing satis on salt restriction & support hose; notes some DJD esp hands which tend to go to sleep; she is back on the Fosamax for her Osteoporosis... LABS 3/13:  FLP- at goals on Prav40;  Chems- wnl;  CBC- wnl w/ Hg=12.4;  TSH=1.73;  VitD=49  ~  November 12, 2011:  44mo ROV & [name] is stable overall, feeling OK, no new complaints or concerns;  BP controlled on 3 meds and her Lipids have been regulated w/ diet & Prav40...    She saw DrPatterson 7/13> hx paraesoph hernia repair, eats freq sm meals, has LLQ discomfort & severe divertics, on VitB12 shots, Fe & VitD orally + fiber...     We reviewed prob list, meds, xrays and labs> see below for updates >> OK Flu shots today...  ~  May 11, 2012:  44mo ROV & [name] notes that arthritis in her back is causing lots of problems "It's eating up my vertebrae"; she reports MRI that showed "I've got arthritis from my head to my toes"; on  Tamadol as needed, ambulates w/ walker, eval by DrMortenson/ DrWhitfield=> refer to DrIbazedo & DrNewton for shots & not a surg candidate...     HBP>  On MetoprololER25, Diltiazem180 & Hyzaar50-12.5;  BP= 116/72, taking meds regularly & tol well;  Denies CP, palpit, dizzy, SOB, edema, etc...    Chol>  On Prav40 now;  FLP 3/14 shows TChol 140, TG 185, HDL 44, LDL 59; chol improved, needs better low fat diet...    GI>  Hx HH s/p nissen & constip on Dexilant60, Miralax, Senakot-S;  Doing well w/o heartburn, abd pain, swelling, n/v, change in BMs etc...    DJD, LBP> see above...    Osteop>  On Alendronate70, Calcium, MVI, Vit D2000 u daily;  She uses Tramadol for pain; last BMD 6/11 reviewed w/ TScore -2.6 in right FemNeck...    Tremor> w/ head titubations on Primadone 50mg  Tid & stable... Uses Midrin for HA...     Anxiety>  She manages satis & doesn't feel she needs meds...    Anemia> on Fe daily & B12 shots monthly; labs 3/14 show Hg= 13.0, Fe=86 (39%), B12>1500  We reviewed prob list, meds, xrays and labs> see below for updates >>  LABS 3/14:  FLP- ok on Prav40 x TG=185;  Chems- wnl;  CBC- wnl;  Fe=86 (39%sat); TSH=1.47;  B12>1500;  BNP=102...           Problem List:  HYPERTENSION (ICD-401.9) - controlled on TOPROL XL 50- 1/2 tab daily, HYZAAR 50/d, and DILTIAZEM 180mg /d...  ~  BP under good control, tolerates meds well; & denies HA, visual changes, CP, palipit, dizziness, syncope, dyspnea, edema, etc... ~  9/13:  BP= 122/78 & she denies CP, palpit, SOB, edema... ~  3/14:  On MetoprololER25, Diltiazem180 & Hyzaar50-12.5;  BP= 116/72, taking meds regularly & tol well;  Denies CP, palpit, dizzy, SOB, edema, etc.  Hx of CARDIAC ARRHYTHMIA (ICD-427.9) - hx VTach in past, it was exercise induced... eval by DrBBrodie prev- normal cath... stable on meds... last seen 9/05 for pre-op clearance... ~  NuclearStressTest 9/05 showed no scar or ischemia, EF=70%, some mild ectopy...  VENOUS INSUFFICIENCY (ICD-459.81) - she has mild intermittent edema- treated w/ low sodium diet, elevation, support hose...  HYPERCHOLESTEROLEMIA, MILD (ICD-272.0) - prev on diet alone; PRAVASTATIN 40mg /d started 4/12. ~  FLP 3/10 showed TChol 204, TG 116, HDL 46, LDL 134... she prefers diet Rx. ~  FLP 4/11 showed TChol 206, TG 117, HDL 48, LDL 142... offered meds, she prefers diet. ~  FLP 4/12 on diet alone showed TChol 238, TG 121, HDL 55, LDL 158... rec to try Prav40. ~  FLP 3/13 on Prav40 showed TChol 139, TG 94, HDL 56, LDL 65... Continue same... ~  FLP 3/14 on Prav40 showed TChol 140, TG 185, HDL 44, LDL 59... Needs better low fat diet...  HIATAL HERNIA (ICD-553.3) - on DEXILANT 60mg /d... doing well w/ intermittent reflux symptoms, but no dysphagia, etc... she is s/p nissen fundoplication for a large HH... ~  last EGD 3/08  showed prev HH surg, mild stricture, some gastritis... ~  Also takes a GI Cocktail as needed per DRP, and B12 shots monthly...  CONSTIPATION, CHRONIC (ICD-564.09) - on Miralax, & Senakot-S Prn for constip... ~  last colonoscopy 9/00 showed marked tortuous redundant colon, ?adhesions & was incomplete; subseq Ba enema was unremarkable w/o any lesions identified...  Hx of URINARY TRACT INFECTION (ICD-599.0) - she has some bladder symptoms- going q2-3H during the night, but is apparently OK during  the day... she refuses urology referral & doesn't want medication trial...  DEGENERATIVE JOINT DISEASE, GENERALIZED (ICD-715.00) - on TRAMADOL 50mg  Prn (using about 2/d)... LBP eval by DrMortenson/ Whitfield w/ shots in her knee, and back she says...   OSTEOPOROSIS (ICD-733.00) - prev on Fosamax but pt DC'd this w/ prev GIB... BMD 3/09 showed TScores -1.6 in Spine, -2.4 in [facility]... started BONIVA 150mg /mo and tolerating this satis- just c/o $$$ in the donut hole... also rec to take CALTRATE Bid and MVI... ~  labs 3/10 showed Vit D = 28... rec OTC Vit D supplement 1000 u daily. ~  labs 3/11 showed Vit D = 24... rec incr to 2000 u vit D daily... ~  Labs 4/12 showed Vit D = 47... Stable on 2000u daily. ~  Labs 3/13 showed Vit D level = 49... Continue same...  TREMOR (ICD-781.0) - she has head titubations on PRIMADONE 50mg Tid...  ANXIETY (ICD-300.00)  ANEMIA (ICD-285.9) - she had an Fe defic anemia in 2008 w/ GI eval by DrGessner- neg EGD, neg colon, capsule endoscopy without lesions seen... she takes REPLIVA daily, but insurance won't pay- therefore change to OTC Fe supplement...  ~  labs 3/09 showed Hg= 12.9, and FE= 80... ~  labs 3/10 showed Hg= 13.1, Fe= 52... rec> OTC FeSO4 supplement... ~  labs 3/11 showed Hg= 13.2 ~  Labs 3/13 showed Hg= 12.4, MCV= 98 ~  Labs 3/14 showed Hg= 13.0, Fe=86 (39%sat)...   Past Surgical History  Procedure Laterality Date  . Nissen fundoplication    .  Cholecystectomy    . Total abdominal hysterectomy    . Colonoscopy    . Upper gastrointestinal endoscopy      Outpatient Encounter Prescriptions as of 05/11/2012  Medication Sig Dispense Refill  . alendronate (FOSAMAX) 70 MG tablet Take 1 tablet (70 mg total) by mouth every 7 (seven) days. Take with a full glass of water on an empty stomach.  4 tablet  11  . APAP-Isometheptene-Dichloral 325-65-100 MG per capsule Take 1 capsule by mouth every 6 (six) hours as needed.  30 capsule  0  . calcium-vitamin D (OSCAL 500/200 D-3) 500-200 MG-UNIT per tablet Take 1 tablet by mouth daily.        . Cholecalciferol (VITAMIN D3) 2000 UNITS TABS Take 1 tablet by mouth daily.        Marland Kitchen DEXILANT 60 MG capsule TAKE ONE CAPSULE BY MOUTH EVERY DAY  30 capsule  4  . diltiazem (CARDIZEM CD) 180 MG 24 hr capsule TAKE ONE CAPSULE BY MOUTH EVERY DAY  90 capsule  2  . Ferrous Sulfate (IRON) 325 (65 FE) MG TABS Take 1 tablet by mouth daily.        Marland Kitchen losartan-hydrochlorothiazide (HYZAAR) 50-12.5 MG per tablet TAKE ONE TABLET BY MOUTH EVERY DAY  90 tablet  2  . metoprolol succinate (TOPROL-XL) 50 MG 24 hr tablet TAKE ONE-HALF TABLET BY MOUTH EVERY DAY  90 tablet  2  . Multiple Vitamins-Minerals (PRESERVISION AREDS 2 PO) Take 2 capsules by mouth daily.        . polyethylene glycol powder (GLYCOLAX/MIRALAX) powder MIX 17 GRAMS IN LIQUID AND DRINK DAILY  255 g  2  . pravastatin (PRAVACHOL) 40 MG tablet TAKE ONE TABLET BY MOUTH IN THE EVENING  90 tablet  2  . primidone (MYSOLINE) 50 MG tablet TAKE ONE TABLET BY MOUTH THREE TIMES DAILY  270 tablet  2  . traMADol (ULTRAM) 50 MG tablet Take 1 tablet (50 mg total)  by mouth 3 (three) times daily as needed for pain.  90 tablet  5  . AMBULATORY NON FORMULARY MEDICATION Medication Name: GI Cocktail with 20cc Maalox, 10cc Donnatal, 20cc Viscous Lidocaine 2%, take 30cc dose q8hr as needed for GI upset.  1200 mL  6  . dexlansoprazole (DEXILANT) 60 MG capsule Take 1 capsule (60 mg total) by  mouth daily.  10 capsule  0   No facility-administered encounter medications on file as of 05/11/2012.    No Known Allergies   Current Medications, Allergies, Past Medical History, Past Surgical History, Family History, and Social History were reviewed in [employer] record.   Review of Systems        See HPI - all other systems neg except as noted...  The patient complains of decreased hearing and dyspnea on exertion.  The patient denies anorexia, fever, weight loss, weight gain, vision loss, hoarseness, chest pain, syncope, peripheral edema, prolonged cough, headaches, hemoptysis, abdominal pain, melena, hematochezia, severe indigestion/heartburn, hematuria, incontinence, muscle weakness, suspicious skin lesions, transient blindness, difficulty walking, depression, unusual weight change, abnormal bleeding, enlarged lymph nodes, and angioedema.     Objective:   Physical Exam      WD, WN, 79 y/o WF in NAD... GENERAL:  Alert & oriented; pleasant & cooperative... HEENT:  Carl/AT, EOM-wnl, PERRLA, EACs-clear, TMs-wnl, NOSE-clear, THROAT-clear & wnl. NECK:  Supple w/ fairROM; no JVD; normal carotid impulses w/o bruits; no thyromegaly or nodules palpated; no lymphadenopathy. CHEST:  Clear to P & A; without wheezes/ rales/ or rhonchi. HEART:  Regular Rhythm; without murmurs/ rubs/ or gallops. ABDOMEN:  Soft & nontender; normal bowel sounds; no organomegaly or masses detected. EXT: without deformities, mod arthritic changes; no varicose veins/ +venous insuffic/ no edema. NEURO:  CN's intact;  no focal neuro deficits... DERM:  No lesions noted; no rash etc...  RADIOLOGY DATA:  Reviewed in the EPIC EMR & discussed w/ the patient...  LABORATORY DATA:  Reviewed in the EPIC EMR & discussed w/ the patient...   Assessment & Plan:    HBP>  On MetoprololER25, Diltiazem180 & Hyzaar50-12.5;  BP controlled on meds, continue same!     Chol>  On Prav40 now;  FLP is much  improved & now at goals on the Prav40- continue same...     GI>  Hx HH s/p Nissen & constip on Dexilant 60mg /d, Miralax, Senakot-S;  Doing well w/o heartburn, abd pain, n/v, change in BMs etc...     Osteop>  On Alendronate, calcium, MVI, Vit D; 2000 u daily;  She uses Tramadol for pain; Last BMD 6/11 reviewed w/ TScore -2.6 in right FemNeck...     Tremor> w/ head titubations on Primadone 50mg  Tid & stable...      Anxiety>  She manages well & doesn't require meds...     Anemia> on Fe daily; Hg 3/14 was 13.0   Patient's Medications  New Prescriptions   No medications on file  Previous Medications   ALENDRONATE (FOSAMAX) 70 MG TABLET    Take 1 tablet (70 mg total) by mouth every 7 (seven) days. Take with a full glass of water on an empty stomach.   AMBULATORY NON FORMULARY MEDICATION    Medication Name: GI Cocktail with 20cc Maalox, 10cc Donnatal, 20cc Viscous Lidocaine 2%, take 30cc dose q8hr as needed for GI upset.   APAP-ISOMETHEPTENE-DICHLORAL 325-65-100 MG PER CAPSULE    Take 1 capsule by mouth every 6 (six) hours as needed.   CALCIUM-VITAMIN D (OSCAL 500/200 D-3) 500-200  MG-UNIT PER TABLET    Take 1 tablet by mouth daily.     CHOLECALCIFEROL (VITAMIN D3) 2000 UNITS TABS    Take 1 tablet by mouth daily.     DEXLANSOPRAZOLE (DEXILANT) 60 MG CAPSULE    Take 1 capsule (60 mg total) by mouth daily.   DILTIAZEM (CARDIZEM CD) 180 MG 24 HR CAPSULE    TAKE ONE CAPSULE BY MOUTH EVERY DAY   FERROUS SULFATE (IRON) 325 (65 FE) MG TABS    Take 1 tablet by mouth daily.     LOSARTAN-HYDROCHLOROTHIAZIDE (HYZAAR) 50-12.5 MG PER TABLET    TAKE ONE TABLET BY MOUTH EVERY DAY   METOPROLOL SUCCINATE (TOPROL-XL) 50 MG 24 HR TABLET    TAKE ONE-HALF TABLET BY MOUTH EVERY DAY   MULTIPLE VITAMINS-MINERALS (PRESERVISION AREDS 2 PO)    Take 2 capsules by mouth daily.     POLYETHYLENE GLYCOL POWDER (GLYCOLAX/MIRALAX) POWDER    MIX 17 GRAMS IN LIQUID AND DRINK DAILY   PRAVASTATIN (PRAVACHOL) 40 MG TABLET    TAKE ONE  TABLET BY MOUTH IN THE EVENING   PRIMIDONE (MYSOLINE) 50 MG TABLET    TAKE ONE TABLET BY MOUTH THREE TIMES DAILY  Modified Medications   Modified Medication Previous Medication   DEXILANT 60 MG CAPSULE DEXILANT 60 MG capsule      TAKE ONE CAPSULE BY MOUTH EVERY DAY    TAKE ONE CAPSULE BY MOUTH EVERY DAY   TRAMADOL (ULTRAM) 50 MG TABLET traMADol (ULTRAM) 50 MG tablet      Take 1 tablet (50 mg total) by mouth 3 (three) times daily as needed for pain.    Take 1 tablet (50 mg total) by mouth 3 (three) times daily as needed for pain.  Discontinued Medications   No medications on file

## 2012-05-11 NOTE — Patient Instructions (Addendum)
Today we updated your med list in our EPIC system...    Continue your current medications the same...    We refilled your Tramadol per request...  Today we did your follow up FASTING blood work...    We will contact you w/ the results when available...   Please stay in close contact w/ your Orthopedic specialists regarding your back pain...    Hopefully you will be able to increase your mobility...  Call for any questions...  Let's continue our routine 6 month follow up visits, but call anytime if needed for problems.Marland KitchenMarland Kitchen

## 2012-05-12 DIAGNOSIS — D649 Anemia, unspecified: Secondary | ICD-10-CM

## 2012-05-12 MED ORDER — CYANOCOBALAMIN 1000 MCG/ML IJ SOLN
1000.0000 ug | Freq: Once | INTRAMUSCULAR | Status: AC
  Administered 2012-05-12: 1000 ug via INTRAMUSCULAR

## 2012-05-28 ENCOUNTER — Other Ambulatory Visit: Payer: Self-pay | Admitting: Gastroenterology

## 2012-06-08 ENCOUNTER — Ambulatory Visit (INDEPENDENT_AMBULATORY_CARE_PROVIDER_SITE_OTHER): Payer: Medicare Other

## 2012-06-08 DIAGNOSIS — D649 Anemia, unspecified: Secondary | ICD-10-CM

## 2012-06-10 MED ORDER — CYANOCOBALAMIN 1000 MCG/ML IJ SOLN
1000.0000 ug | Freq: Once | INTRAMUSCULAR | Status: AC
  Administered 2012-06-10: 1000 ug via INTRAMUSCULAR

## 2012-07-06 ENCOUNTER — Ambulatory Visit: Payer: Medicare Other

## 2012-07-06 ENCOUNTER — Telehealth: Payer: Self-pay | Admitting: Pulmonary Disease

## 2012-07-06 ENCOUNTER — Ambulatory Visit (INDEPENDENT_AMBULATORY_CARE_PROVIDER_SITE_OTHER): Payer: Medicare Other

## 2012-07-06 DIAGNOSIS — D649 Anemia, unspecified: Secondary | ICD-10-CM

## 2012-07-06 DIAGNOSIS — M810 Age-related osteoporosis without current pathological fracture: Secondary | ICD-10-CM

## 2012-07-06 NOTE — Telephone Encounter (Signed)
Pt stopped in to day for a b12 and requesting an order for Bone density test . Is this ok to schedule for her in oct?  Dr Kriste Basque please advise  Thank you

## 2012-07-06 NOTE — Telephone Encounter (Signed)
, °

## 2012-07-06 NOTE — Telephone Encounter (Signed)
Per SN---ok to schedule the BMD.  This order has been placed for the pt.  Nothing further is needed.

## 2012-07-08 MED ORDER — CYANOCOBALAMIN 1000 MCG/ML IJ SOLN
1000.0000 ug | Freq: Once | INTRAMUSCULAR | Status: AC
  Administered 2012-07-08: 1000 ug via INTRAMUSCULAR

## 2012-07-20 ENCOUNTER — Other Ambulatory Visit: Payer: Self-pay | Admitting: Pulmonary Disease

## 2012-08-04 ENCOUNTER — Other Ambulatory Visit: Payer: Self-pay | Admitting: Gastroenterology

## 2012-08-05 ENCOUNTER — Other Ambulatory Visit: Payer: Self-pay | Admitting: Gastroenterology

## 2012-08-05 NOTE — Telephone Encounter (Signed)
Patient will need an office visit for further refills  

## 2012-08-06 ENCOUNTER — Ambulatory Visit (INDEPENDENT_AMBULATORY_CARE_PROVIDER_SITE_OTHER): Payer: Medicare Other

## 2012-08-06 DIAGNOSIS — D649 Anemia, unspecified: Secondary | ICD-10-CM

## 2012-08-06 MED ORDER — CYANOCOBALAMIN 1000 MCG/ML IJ SOLN
1000.0000 ug | Freq: Once | INTRAMUSCULAR | Status: AC
  Administered 2012-08-06: 1000 ug via INTRAMUSCULAR

## 2012-09-03 ENCOUNTER — Ambulatory Visit: Payer: Medicare Other

## 2012-09-04 ENCOUNTER — Ambulatory Visit (INDEPENDENT_AMBULATORY_CARE_PROVIDER_SITE_OTHER): Payer: Medicare Other

## 2012-09-04 DIAGNOSIS — D649 Anemia, unspecified: Secondary | ICD-10-CM

## 2012-09-10 MED ORDER — CYANOCOBALAMIN 1000 MCG/ML IJ SOLN
1000.0000 ug | Freq: Once | INTRAMUSCULAR | Status: AC
  Administered 2012-09-10: 1000 ug via INTRAMUSCULAR

## 2012-09-13 ENCOUNTER — Other Ambulatory Visit: Payer: Self-pay | Admitting: Pulmonary Disease

## 2012-09-16 ENCOUNTER — Other Ambulatory Visit: Payer: Self-pay | Admitting: Pulmonary Disease

## 2012-09-28 ENCOUNTER — Ambulatory Visit (INDEPENDENT_AMBULATORY_CARE_PROVIDER_SITE_OTHER): Payer: Medicare Other

## 2012-09-28 DIAGNOSIS — D649 Anemia, unspecified: Secondary | ICD-10-CM

## 2012-09-29 MED ORDER — CYANOCOBALAMIN 1000 MCG/ML IJ SOLN
1000.0000 ug | Freq: Once | INTRAMUSCULAR | Status: AC
  Administered 2012-09-29: 1000 ug via INTRAMUSCULAR

## 2012-10-12 ENCOUNTER — Other Ambulatory Visit: Payer: Self-pay | Admitting: Pulmonary Disease

## 2012-10-13 ENCOUNTER — Other Ambulatory Visit: Payer: Self-pay | Admitting: Pulmonary Disease

## 2012-10-29 ENCOUNTER — Ambulatory Visit (INDEPENDENT_AMBULATORY_CARE_PROVIDER_SITE_OTHER): Payer: Medicare Other

## 2012-10-29 DIAGNOSIS — D649 Anemia, unspecified: Secondary | ICD-10-CM

## 2012-10-29 MED ORDER — CYANOCOBALAMIN 1000 MCG/ML IJ SOLN
1000.0000 ug | Freq: Once | INTRAMUSCULAR | Status: AC
  Administered 2012-10-29: 1000 ug via INTRAMUSCULAR

## 2012-11-02 ENCOUNTER — Other Ambulatory Visit: Payer: Self-pay | Admitting: Pulmonary Disease

## 2012-11-02 MED ORDER — TRAMADOL HCL 50 MG PO TABS: 50.0000 mg | ORAL_TABLET | Freq: Three times a day (TID) | ORAL | Status: DC | PRN

## 2012-11-11 ENCOUNTER — Encounter: Payer: Self-pay | Admitting: Pulmonary Disease

## 2012-11-11 ENCOUNTER — Ambulatory Visit (INDEPENDENT_AMBULATORY_CARE_PROVIDER_SITE_OTHER): Payer: Medicare Other | Admitting: Pulmonary Disease

## 2012-11-11 VITALS — BP 120/62 | HR 64 | Temp 97.6°F | Ht 61.0 in | Wt 141.2 lb

## 2012-11-11 DIAGNOSIS — K5909 Other constipation: Secondary | ICD-10-CM

## 2012-11-11 DIAGNOSIS — K449 Diaphragmatic hernia without obstruction or gangrene: Secondary | ICD-10-CM

## 2012-11-11 DIAGNOSIS — K219 Gastro-esophageal reflux disease without esophagitis: Secondary | ICD-10-CM

## 2012-11-11 DIAGNOSIS — E538 Deficiency of other specified B group vitamins: Secondary | ICD-10-CM

## 2012-11-11 DIAGNOSIS — Z23 Encounter for immunization: Secondary | ICD-10-CM

## 2012-11-11 DIAGNOSIS — D509 Iron deficiency anemia, unspecified: Secondary | ICD-10-CM

## 2012-11-11 DIAGNOSIS — I499 Cardiac arrhythmia, unspecified: Secondary | ICD-10-CM

## 2012-11-11 DIAGNOSIS — M159 Polyosteoarthritis, unspecified: Secondary | ICD-10-CM

## 2012-11-11 DIAGNOSIS — E7800 Pure hypercholesterolemia, unspecified: Secondary | ICD-10-CM

## 2012-11-11 DIAGNOSIS — R259 Unspecified abnormal involuntary movements: Secondary | ICD-10-CM

## 2012-11-11 DIAGNOSIS — M810 Age-related osteoporosis without current pathological fracture: Secondary | ICD-10-CM

## 2012-11-11 DIAGNOSIS — F411 Generalized anxiety disorder: Secondary | ICD-10-CM

## 2012-11-11 DIAGNOSIS — M545 Low back pain: Secondary | ICD-10-CM

## 2012-11-11 DIAGNOSIS — I10 Essential (primary) hypertension: Secondary | ICD-10-CM

## 2012-11-11 DIAGNOSIS — I872 Venous insufficiency (chronic) (peripheral): Secondary | ICD-10-CM

## 2012-11-11 NOTE — Progress Notes (Signed)
Subjective:    Patient ID: [name], female    DOB: 20-Nov-1923, 79 y.o.   MRN: [id number]  HPI 79 y/o WF here for a follow up visit... she has mult med problems including:  HBP;  Cardiac arrhythmia;  VV/ Ven IOnsuffic;  Hyperchol;  HH- s/p nissen/ Constip;  UTIs;  DJD;  Osteoporosis;  Tremor;  Anxiety;  Hx of anemia...  ~  May 10, 2011:  39mo ROV & she continues stable at 79y/o no new complaints or concerns>     She continues to f/u w/ DrPatterson for GI> seen 10/12 for her dysphagia (s/p repair of paraesoph HH in 2005), GERD, chr anemia (Fe defic w/ neg work-up); she also had ventral hernia repaired 2007; she says she can't swallow anything bigger than a grain of rice, and she has vomiting episodes; he performed another EGD 10/12 for progressive solid food dysphagia> post op changes noted w/ persist lateral wall hernia, friable, food debris noted & inflamm polyp in pyloric channel; he changed her Prilosec to Dexilant 60mg /d & rec freq small meals etc; she says that GI cocktail from [facility] helps some; he continues to give her B12 shots Qmonth...     Other medical problems are stable> see prob list below; BP controlled on Metop & Hyzaar; denies cardiac arrhythmia on the dialtiazem; legs doing satis on salt restriction & support hose; notes some DJD esp hands which tend to go to sleep; she is back on the Fosamax for her Osteoporosis... LABS 3/13:  FLP- at goals on Prav40;  Chems- wnl;  CBC- wnl w/ Hg=12.4;  TSH=1.73;  VitD=49  ~  November 12, 2011:  47mo ROV & [name] is stable overall, feeling OK, no new complaints or concerns;  BP controlled on 3 meds and her Lipids have been regulated w/ diet & Prav40...    She saw DrPatterson 7/13> hx paraesoph hernia repair, eats freq sm meals, has LLQ discomfort & severe divertics, on VitB12 shots, Fe & VitD orally + fiber...     We reviewed prob list, meds, xrays and labs> see below for updates >> OK Flu shots today...  ~  May 11, 2012:  47mo  ROV & [name] notes that arthritis in her back is causing lots of problems "It's eating up my vertebrae"; she reports MRI that showed "I've got arthritis from my head to my toes"; on Tamadol as needed, ambulates w/ walker, eval by DrMortenson/ DrWhitfield=> refer to DrIbazedo & DrNewton for shots & not a surg candidate...    HBP>  On MetoprololER25, Diltiazem180 & Hyzaar50-12.5;  BP= 116/72, taking meds regularly & tol well;  Denies CP, palpit, dizzy, SOB, edema, etc...    Chol>  On Prav40 now;  FLP 3/14 shows TChol 140, TG 185, HDL 44, LDL 59; chol improved, needs better low fat diet...    GI>  Hx HH s/p nissen & constip on Dexilant60, Miralax, Senakot-S;  Doing well w/o heartburn, abd pain, swelling, n/v, change in BMs etc...    DJD, LBP> see above...    Osteop>  On Alendronate70, Calcium, MVI, Vit D2000 u daily;  She uses Tramadol for pain; last BMD 6/11 reviewed w/ TScore -2.6 in right FemNeck...    Tremor> w/ head titubations on Primadone 50mg  Tid & stable... Uses Midrin for HA...    Anxiety>  She manages satis & doesn't feel she needs meds...    Anemia> on Fe daily & B12 shots monthly; labs 3/14 show Hg= 13.0, Fe=86 (39%), B12>1500 We  reviewed prob list, meds, xrays and labs> see below for updates >>  LABS 3/14:  FLP- ok on Prav40 x TG=185;  Chems- wnl;  CBC- wnl;  Fe=86 (39%sat); TSH=1.47;  B12>1500;  BNP=102...  ~  November 11, 2012:  17mo ROV & [name] is overall stable- notes some SOB w/ housework & she is remined to be more active, exercise program, etc...     BP controlled on MetoprololER25, Diltiazem180 & Hyzaar50-12.5;  BP= 120/62 & she denies CP, palpit, dizzy, syncope, edema, etc...    Chol is regulated w/ Prav40;  FLP 3/14 showed TChol 140, TG 185, HDL 44, LDL 59...    GI is followed by DrPatterson on GI cocktail prn, Dexilant60, Miralax, Senakot-S; she is s/p nissen; symptoms controlled as long as she takes her meds regularly...    She has a lot of arthritis at 6- on Tylenol &  Tramadol as needed; she ambulates w/ walker & cane, hard for her to bend over w/ back pain, etc- prev Ortho eval w/ MRI spine 2011 by DrRendall w/ sp stenosis & foraminal narrowing; she saw DrWhitfield 2/14 w/ several shots in back at that time...    Other medical issues as noted above- stable... B12 level is now wnl on the B12 shots...  We reviewed prob list, meds, xrays and labs> see below for updates >> OK Flu shot today...          Problem List:  HYPERTENSION (ICD-401.9) - controlled on TOPROL XL 50- 1/2 tab daily, HYZAAR 50/d, and DILTIAZEM 180mg /d...  ~  BP under good control, tolerates meds well; & denies HA, visual changes, CP, palipit, dizziness, syncope, dyspnea, edema, etc... ~  9/13:  BP= 122/78 & she denies CP, palpit, SOB, edema... ~  3/14:  On MetoprololER25, Diltiazem180 & Hyzaar50-12.5;  BP= 116/72, taking meds regularly & tol well;  Denies CP, palpit, dizzy, SOB, edema, etc. ~  9/14: BP controlled on MetoprololER25, Diltiazem180 & Hyzaar50-12.5;  BP= 120/62 & she denies CP, palpit, dizzy, syncope, edema, etc.  Hx of CARDIAC ARRHYTHMIA (ICD-427.9) - hx VTach in past, it was exercise induced... eval by DrBBrodie prev- normal cath... stable on meds... last seen 9/05 for pre-op clearance... ~  NuclearStressTest 9/05 showed no scar or ischemia, EF=70%, some mild ectopy...  VENOUS INSUFFICIENCY (ICD-459.81) - she has mild intermittent edema- treated w/ low sodium diet, elevation, support hose...  HYPERCHOLESTEROLEMIA, MILD (ICD-272.0) - prev on diet alone; PRAVASTATIN 40mg /d started 4/12. ~  FLP 3/10 showed TChol 204, TG 116, HDL 46, LDL 134... she prefers diet Rx. ~  FLP 4/11 showed TChol 206, TG 117, HDL 48, LDL 142... offered meds, she prefers diet. ~  FLP 4/12 on diet alone showed TChol 238, TG 121, HDL 55, LDL 158... rec to try Prav40. ~  FLP 3/13 on Prav40 showed TChol 139, TG 94, HDL 56, LDL 65... Continue same... ~  FLP 3/14 on Prav40 showed TChol 140, TG 185, HDL 44, LDL  59... Needs better low fat diet...  HIATAL HERNIA (ICD-553.3) - on DEXILANT 60mg /d... doing well w/ intermittent reflux symptoms, but no dysphagia, etc... she is s/p nissen fundoplication for a large HH... ~  last EGD 3/08 showed prev HH surg, mild stricture, some gastritis... ~  Also takes a GI Cocktail as needed per DRP, and B12 shots monthly... ~  9/14: GI is followed by DrPatterson on GI cocktail prn, Dexilant60, Miralax, Senakot-S; she is s/p nissen; symptoms controlled as long as she takes her meds regularly.  CONSTIPATION,  CHRONIC (ICD-564.09) - on Miralax, & Senakot-S Prn for constip... ~  last colonoscopy 9/00 showed marked tortuous redundant colon, ?adhesions & was incomplete; subseq Ba enema was unremarkable w/o any lesions identified...  Hx of URINARY TRACT INFECTION (ICD-599.0) - she has some bladder symptoms- going q2-3H during the night, but is apparently OK during the day... she refuses urology referral & doesn't want medication trial...  DEGENERATIVE JOINT DISEASE, GENERALIZED (ICD-715.00) - on TRAMADOL 50mg  Prn (using about 2/d)... LBP eval by DrMortenson/ Whitfield w/ shots in her knee, and back she says...  ~  She has a lot of arthritis at 69- on Tylenol & Tramadol as needed; she ambulates w/ walker & cane, hard for her to bend over w/ back pain, etc- prev Ortho eval w/ MRI spine 2011 by DrRendall w/ sp stenosis & foraminal narrowing; she saw DrWhitfield 2/14 w/ several shots in back at that time...  OSTEOPOROSIS (ICD-733.00) - prev on Fosamax but pt DC'd this w/ prev GIB... BMD 3/09 showed TScores -1.6 in Spine, -2.4 in [facility]... started BONIVA 150mg /mo and tolerating this satis- just c/o $$$ in the donut hole... also rec to take CALTRATE Bid and MVI... ~  labs 3/10 showed Vit D = 28... rec OTC Vit D supplement 1000 u daily. ~  labs 3/11 showed Vit D = 24... rec incr to 2000 u vit D daily... ~  Labs 4/12 showed Vit D = 47... Stable on 2000u daily. ~  Labs 3/13 showed Vit D  level = 49... Continue same...  TREMOR (ICD-781.0) - she has head titubations on PRIMADONE 50mg Tid...  ANXIETY (ICD-300.00)  ANEMIA (ICD-285.9) - she had an Fe defic anemia in 2008 w/ GI eval by DrGessner- neg EGD, neg colon, capsule endoscopy without lesions seen... she takes REPLIVA daily, but insurance won't pay- therefore change to OTC Fe supplement...  ~  labs 3/09 showed Hg= 12.9, and FE= 80... ~  labs 3/10 showed Hg= 13.1, Fe= 52... rec> OTC FeSO4 supplement... ~  labs 3/11 showed Hg= 13.2 ~  Labs 3/13 showed Hg= 12.4, MCV= 98 ~  Labs 3/14 showed Hg= 13.0, Fe=86 (39%sat)...   Past Surgical History  Procedure Laterality Date  . Nissen fundoplication    . Cholecystectomy    . Total abdominal hysterectomy    . Colonoscopy    . Upper gastrointestinal endoscopy      Outpatient Encounter Prescriptions as of 11/11/2012  Medication Sig Dispense Refill  . alendronate (FOSAMAX) 70 MG tablet TAKE ONE TABLET BY MOUTH EVERY 7 DAYS. TAKE WITH A FULL GLASS OF WATER ON AN EMPTY STOMACH  4 tablet  6  . AMBULATORY NON FORMULARY MEDICATION Medication Name: GI Cocktail with 20cc Maalox, 10cc Donnatal, 20cc Viscous Lidocaine 2%, take 30cc dose q8hr as needed for GI upset.  1200 mL  6  . calcium-vitamin D (OSCAL 500/200 D-3) 500-200 MG-UNIT per tablet Take 1 tablet by mouth daily.        . Cholecalciferol (VITAMIN D3) 2000 UNITS TABS Take 1 tablet by mouth daily.        Marland Kitchen DEXILANT 60 MG capsule TAKE ONE CAPSULE  BY MOUTH ONCE DAILY  30 capsule  0  . diltiazem (CARDIZEM CD) 180 MG 24 hr capsule TAKE ONE CAPSULE BY MOUTH EVERY DAY  90 capsule  3  . Ferrous Sulfate (IRON) 325 (65 FE) MG TABS Take 1 tablet by mouth daily.        Marland Kitchen losartan-hydrochlorothiazide (HYZAAR) 50-12.5 MG per tablet TAKE ONE TABLET BY MOUTH  EVERY DAY  90 tablet  2  . metoprolol succinate (TOPROL-XL) 50 MG 24 hr tablet TAKE ONE-HALF TABLET BY MOUTH EVERY DAY  90 tablet  2  . Multiple Vitamins-Minerals (PRESERVISION AREDS 2 PO)  Take 2 capsules by mouth daily.        . polyethylene glycol powder (GLYCOLAX/MIRALAX) powder MIX 17 GRAMS IN LIQUID AND DRINK DAILY  255 g  2  . pravastatin (PRAVACHOL) 40 MG tablet TAKE ONE TABLET BY MOUTH IN THE EVENING  90 tablet  0  . primidone (MYSOLINE) 50 MG tablet TAKE ONE TABLET BY MOUTH THREE TIMES DAILY  270 tablet  0  . traMADol (ULTRAM) 50 MG tablet Take 1 tablet (50 mg total) by mouth 3 (three) times daily as needed for pain.  90 tablet  5  . APAP-Isometheptene-Dichloral 325-65-100 MG per capsule Take 1 capsule by mouth every 6 (six) hours as needed.  30 capsule  0  . [DISCONTINUED] alendronate (FOSAMAX) 70 MG tablet TAKE ONE TABLET BY MOUTH EVERY 7 DAYS. TAKE WITH A FULL GLASS OF WATER ON AN EMPTY STOMACH  4 tablet  0  . [DISCONTINUED] DEXILANT 60 MG capsule TAKE ONE CAPSULE  BY MOUTH ONCE DAILY  30 capsule  0  . [DISCONTINUED] dexlansoprazole (DEXILANT) 60 MG capsule Take 1 capsule (60 mg total) by mouth daily.  10 capsule  0   No facility-administered encounter medications on file as of 11/11/2012.    No Known Allergies   Current Medications, Allergies, Past Medical History, Past Surgical History, Family History, and Social History were reviewed in [employer] record.   Review of Systems        See HPI - all other systems neg except as noted...  The patient complains of decreased hearing and dyspnea on exertion.  The patient denies anorexia, fever, weight loss, weight gain, vision loss, hoarseness, chest pain, syncope, peripheral edema, prolonged cough, headaches, hemoptysis, abdominal pain, melena, hematochezia, severe indigestion/heartburn, hematuria, incontinence, muscle weakness, suspicious skin lesions, transient blindness, difficulty walking, depression, unusual weight change, abnormal bleeding, enlarged lymph nodes, and angioedema.     Objective:   Physical Exam      WD, WN, 79 y/o WF in NAD... GENERAL:  Alert & oriented; pleasant &  cooperative... HEENT:  [place]/AT, EOM-wnl, PERRLA, EACs-clear, TMs-wnl, NOSE-clear, THROAT-clear & wnl. NECK:  Supple w/ fairROM; no JVD; normal carotid impulses w/o bruits; no thyromegaly or nodules palpated; no lymphadenopathy. CHEST:  Clear to P & A; without wheezes/ rales/ or rhonchi. HEART:  Regular Rhythm; without murmurs/ rubs/ or gallops. ABDOMEN:  Soft & nontender; normal bowel sounds; no organomegaly or masses detected. EXT: without deformities, mod arthritic changes; no varicose veins/ +venous insuffic/ no edema. NEURO:  CN's intact;  no focal neuro deficits... DERM:  No lesions noted; no rash etc...  RADIOLOGY DATA:  Reviewed in the EPIC EMR & discussed w/ the patient...  LABORATORY DATA:  Reviewed in the EPIC EMR & discussed w/ the patient...   Assessment & Plan:    HBP>  On MetoprololER25, Diltiazem180 & Hyzaar50-12.5;  BP controlled on meds, continue same!     Chol>  On Prav40 now;  FLP is much improved & now at goals on the Prav40- continue same...     GI>  Hx HH s/p Nissen & constip on Dexilant 60mg /d, Miralax, Senakot-S;  Doing well w/o heartburn, abd pain, n/v, change in BMs etc...     Osteop>  On Alendronate, calcium, MVI, Vit D; 2000 u daily;  She uses Tramadol for pain; Last BMD 6/11 reviewed w/ TScore -2.6 in right FemNeck...     Tremor> w/ head titubations on Primadone 50mg  Tid & stable...      Anxiety>  She manages well & doesn't require meds...     Anemia> on Fe daily; Hg 3/14 was 13.0   Patient's Medications  New Prescriptions   No medications on file  Previous Medications   ALENDRONATE (FOSAMAX) 70 MG TABLET    TAKE ONE TABLET BY MOUTH EVERY 7 DAYS. TAKE WITH A FULL GLASS OF WATER ON AN EMPTY STOMACH   AMBULATORY NON FORMULARY MEDICATION    Medication Name: GI Cocktail with 20cc Maalox, 10cc Donnatal, 20cc Viscous Lidocaine 2%, take 30cc dose q8hr as needed for GI upset.   APAP-ISOMETHEPTENE-DICHLORAL 325-65-100 MG PER CAPSULE    Take 1 capsule by mouth  every 6 (six) hours as needed.   CALCIUM-VITAMIN D (OSCAL 500/200 D-3) 500-200 MG-UNIT PER TABLET    Take 1 tablet by mouth daily.     CHOLECALCIFEROL (VITAMIN D3) 2000 UNITS TABS    Take 1 tablet by mouth daily.     DEXILANT 60 MG CAPSULE    TAKE ONE CAPSULE  BY MOUTH ONCE DAILY   DILTIAZEM (CARDIZEM CD) 180 MG 24 HR CAPSULE    TAKE ONE CAPSULE BY MOUTH EVERY DAY   FERROUS SULFATE (IRON) 325 (65 FE) MG TABS    Take 1 tablet by mouth daily.     LOSARTAN-HYDROCHLOROTHIAZIDE (HYZAAR) 50-12.5 MG PER TABLET    TAKE ONE TABLET BY MOUTH EVERY DAY   METOPROLOL SUCCINATE (TOPROL-XL) 50 MG 24 HR TABLET    TAKE ONE-HALF TABLET BY MOUTH EVERY DAY   MULTIPLE VITAMINS-MINERALS (PRESERVISION AREDS 2 PO)    Take 2 capsules by mouth daily.     POLYETHYLENE GLYCOL POWDER (GLYCOLAX/MIRALAX) POWDER    MIX 17 GRAMS IN LIQUID AND DRINK DAILY   TRAMADOL (ULTRAM) 50 MG TABLET    Take 1 tablet (50 mg total) by mouth 3 (three) times daily as needed for pain.  Modified Medications   Modified Medication Previous Medication   DEXILANT 60 MG CAPSULE DEXILANT 60 MG capsule      TAKE ONE CAPSULE BY MOUTH ONCE DAILY (PLEASE  MAKE  AN  APPOINTMENT  FOR  FURTHER  REFILLS)    TAKE ONE CAPSULE BY MOUTH ONCE DAILY   PRAVASTATIN (PRAVACHOL) 40 MG TABLET pravastatin (PRAVACHOL) 40 MG tablet      TAKE ONE TABLET BY MOUTH ONCE DAILY IN THE EVENING    TAKE ONE TABLET BY MOUTH IN THE EVENING   PRIMIDONE (MYSOLINE) 50 MG TABLET primidone (MYSOLINE) 50 MG tablet      TAKE ONE TABLET BY MOUTH THREE TIMES DAILY    TAKE ONE TABLET BY MOUTH THREE TIMES DAILY  Discontinued Medications   ALENDRONATE (FOSAMAX) 70 MG TABLET    TAKE ONE TABLET BY MOUTH EVERY 7 DAYS. TAKE WITH A FULL GLASS OF WATER ON AN EMPTY STOMACH   DEXILANT 60 MG CAPSULE    TAKE ONE CAPSULE  BY MOUTH ONCE DAILY   DEXLANSOPRAZOLE (DEXILANT) 60 MG CAPSULE    Take 1 capsule (60 mg total) by mouth daily.

## 2012-11-11 NOTE — Patient Instructions (Addendum)
Today we updated your med list in our EPIC system...    Continue your current medications the same...  Try the OTC GLYCERIN suppositories to aid in elimination/ constipation rx...  Follow up w/ ORTHO for your LBP, perhaps some additional shots will help...  Call for any questions...  Let's plan a follow up visit in 4mo w/ FASTING blood work at that time.Marland KitchenMarland Kitchen

## 2012-11-13 ENCOUNTER — Other Ambulatory Visit: Payer: Self-pay | Admitting: Gastroenterology

## 2012-11-13 NOTE — Telephone Encounter (Signed)
PLEASE MAKE AN APPOINTMENT FOR FURTHER REFILLS  

## 2012-11-27 ENCOUNTER — Ambulatory Visit (INDEPENDENT_AMBULATORY_CARE_PROVIDER_SITE_OTHER): Payer: Medicare Other

## 2012-11-27 DIAGNOSIS — D649 Anemia, unspecified: Secondary | ICD-10-CM

## 2012-11-27 MED ORDER — CYANOCOBALAMIN 1000 MCG/ML IJ SOLN
1000.0000 ug | Freq: Once | INTRAMUSCULAR | Status: AC
  Administered 2012-11-27: 1000 ug via INTRAMUSCULAR

## 2012-12-17 ENCOUNTER — Telehealth: Payer: Self-pay | Admitting: Pulmonary Disease

## 2012-12-17 ENCOUNTER — Other Ambulatory Visit: Payer: Self-pay | Admitting: Pulmonary Disease

## 2012-12-17 MED ORDER — PRAVASTATIN SODIUM 40 MG PO TABS: ORAL_TABLET | ORAL | Status: DC

## 2012-12-17 NOTE — Telephone Encounter (Signed)
rx sent. Jennifer Castillo, CMA  

## 2012-12-23 ENCOUNTER — Encounter: Payer: Self-pay | Admitting: Pulmonary Disease

## 2012-12-28 ENCOUNTER — Ambulatory Visit (INDEPENDENT_AMBULATORY_CARE_PROVIDER_SITE_OTHER): Payer: Medicare Other

## 2012-12-28 DIAGNOSIS — E538 Deficiency of other specified B group vitamins: Secondary | ICD-10-CM

## 2012-12-28 MED ORDER — CYANOCOBALAMIN 1000 MCG/ML IJ SOLN
1000.0000 ug | Freq: Once | INTRAMUSCULAR | Status: AC
  Administered 2012-12-28: 1000 ug via INTRAMUSCULAR

## 2012-12-31 ENCOUNTER — Encounter: Payer: Self-pay | Admitting: Pulmonary Disease

## 2013-01-08 ENCOUNTER — Encounter: Payer: Self-pay | Admitting: Gastroenterology

## 2013-01-08 ENCOUNTER — Ambulatory Visit (INDEPENDENT_AMBULATORY_CARE_PROVIDER_SITE_OTHER): Payer: Medicare Other | Admitting: Gastroenterology

## 2013-01-08 ENCOUNTER — Other Ambulatory Visit (INDEPENDENT_AMBULATORY_CARE_PROVIDER_SITE_OTHER): Payer: Medicare Other

## 2013-01-08 VITALS — BP 120/68 | HR 78 | Ht 61.0 in | Wt 138.8 lb

## 2013-01-08 DIAGNOSIS — K5900 Constipation, unspecified: Secondary | ICD-10-CM

## 2013-01-08 DIAGNOSIS — M1990 Unspecified osteoarthritis, unspecified site: Secondary | ICD-10-CM

## 2013-01-08 DIAGNOSIS — K219 Gastro-esophageal reflux disease without esophagitis: Secondary | ICD-10-CM

## 2013-01-08 DIAGNOSIS — M129 Arthropathy, unspecified: Secondary | ICD-10-CM

## 2013-01-08 LAB — SEDIMENTATION RATE: Sed Rate: 17 mm/hr (ref 0–22)

## 2013-01-08 NOTE — Patient Instructions (Signed)
Please purchase Senokot over the counter and take two capsules by mouth once daily   Your physician has requested that you go to the basement for the following lab work before leaving today: Sedimentation Rate Rheumatoid Factor   Dr. Percell Locus office will call you with your appointment, doctors phone number is [phone]

## 2013-01-08 NOTE — Progress Notes (Signed)
This is a 79 year old female with severe arthritis diagnosis degenerative arthritis and osteoporosis processes. Most of her care is been through orthopedic physicians, and I cannot see a rheumatology consultation. She says all of her joints hurt, she's had decreased ambulation because of severe arthralgias. GI-wise her reflux is doing well on daily PPI therapy, she has only mild constipation without melena or hematochezia. She is followed very closely by Dr. Olean Ree primary care and is on multitude of medications. She some slight constipation but denies any real problems. She is up-to-date her endoscopy and colonoscopies. She passes has a mild anemia which seems to have resolved her B12 and iron levels were normal. Current Medications, Allergies, Past Medical History, Past Surgical History, Family History and Social History were reviewed in [employer] record.  ROS: All systems were reviewed and are negative unless otherwise stated in the HPI.          Physical Exam: Elderly-appearing patient in no distress. Blood pressure 120/68, pulse 70 and regular and weight 138. She has ulnar deformity of her hands bilaterally with deformed joints. Did not perform a full rheumatology evaluation. Her chest is clear she appear to be in a regular rhythm without murmurs gallops or rubs. Abdominal exam is unremarkable. Mental status is normal    Assessment and Plan: Patient status post fundoplication repair of a large hilar hernia, she has some anatomical deformity of her surgery which causes her minor dysphagia. I do not think endoscopy is indicated at this time. Have renewed her PPI medications, asked her use when necessary soma for her bowels, and referred her to Dr. Luella Cook in rheumatology. I'll order a sedimentation rate and rheumatology factor today. She any other medications as per Dr. Amada Jupiter.

## 2013-01-09 ENCOUNTER — Other Ambulatory Visit: Payer: Self-pay | Admitting: Gastroenterology

## 2013-01-09 ENCOUNTER — Other Ambulatory Visit: Payer: Self-pay | Admitting: Pulmonary Disease

## 2013-01-25 ENCOUNTER — Ambulatory Visit (INDEPENDENT_AMBULATORY_CARE_PROVIDER_SITE_OTHER): Payer: Medicare Other

## 2013-01-25 DIAGNOSIS — D649 Anemia, unspecified: Secondary | ICD-10-CM

## 2013-01-26 MED ORDER — CYANOCOBALAMIN 1000 MCG/ML IJ SOLN
1000.0000 ug | Freq: Once | INTRAMUSCULAR | Status: AC
  Administered 2013-01-26: 1000 ug via INTRAMUSCULAR

## 2013-02-22 ENCOUNTER — Ambulatory Visit (INDEPENDENT_AMBULATORY_CARE_PROVIDER_SITE_OTHER): Payer: Medicare Other

## 2013-02-22 DIAGNOSIS — D649 Anemia, unspecified: Secondary | ICD-10-CM

## 2013-02-22 MED ORDER — CYANOCOBALAMIN 1000 MCG/ML IJ SOLN
1000.0000 ug | Freq: Once | INTRAMUSCULAR | Status: AC
  Administered 2013-02-22: 1000 ug via INTRAMUSCULAR

## 2013-03-17 ENCOUNTER — Other Ambulatory Visit: Payer: Self-pay | Admitting: Pulmonary Disease

## 2013-03-22 ENCOUNTER — Ambulatory Visit (INDEPENDENT_AMBULATORY_CARE_PROVIDER_SITE_OTHER): Payer: Medicare Other

## 2013-03-22 DIAGNOSIS — D649 Anemia, unspecified: Secondary | ICD-10-CM

## 2013-03-23 MED ORDER — CYANOCOBALAMIN 1000 MCG/ML IJ SOLN
1000.0000 ug | Freq: Once | INTRAMUSCULAR | Status: AC
  Administered 2013-03-23: 1000 ug via INTRAMUSCULAR

## 2013-04-15 ENCOUNTER — Other Ambulatory Visit: Payer: Self-pay | Admitting: Pulmonary Disease

## 2013-04-19 ENCOUNTER — Ambulatory Visit (INDEPENDENT_AMBULATORY_CARE_PROVIDER_SITE_OTHER): Payer: Medicare Other

## 2013-04-19 DIAGNOSIS — D509 Iron deficiency anemia, unspecified: Secondary | ICD-10-CM

## 2013-04-21 MED ORDER — CYANOCOBALAMIN 1000 MCG/ML IJ SOLN
1000.0000 ug | Freq: Once | INTRAMUSCULAR | Status: AC
  Administered 2013-04-21: 1000 ug via INTRAMUSCULAR

## 2013-05-11 ENCOUNTER — Encounter: Payer: Self-pay | Admitting: Pulmonary Disease

## 2013-05-11 ENCOUNTER — Ambulatory Visit (INDEPENDENT_AMBULATORY_CARE_PROVIDER_SITE_OTHER): Payer: Medicare Other | Admitting: Pulmonary Disease

## 2013-05-11 ENCOUNTER — Other Ambulatory Visit (INDEPENDENT_AMBULATORY_CARE_PROVIDER_SITE_OTHER): Payer: Medicare Other

## 2013-05-11 VITALS — BP 130/72 | HR 62 | Temp 97.0°F | Ht 61.0 in | Wt 147.0 lb

## 2013-05-11 DIAGNOSIS — F411 Generalized anxiety disorder: Secondary | ICD-10-CM

## 2013-05-11 DIAGNOSIS — D509 Iron deficiency anemia, unspecified: Secondary | ICD-10-CM

## 2013-05-11 DIAGNOSIS — K219 Gastro-esophageal reflux disease without esophagitis: Secondary | ICD-10-CM

## 2013-05-11 DIAGNOSIS — I499 Cardiac arrhythmia, unspecified: Secondary | ICD-10-CM

## 2013-05-11 DIAGNOSIS — E538 Deficiency of other specified B group vitamins: Secondary | ICD-10-CM

## 2013-05-11 DIAGNOSIS — M810 Age-related osteoporosis without current pathological fracture: Secondary | ICD-10-CM

## 2013-05-11 DIAGNOSIS — I10 Essential (primary) hypertension: Secondary | ICD-10-CM

## 2013-05-11 DIAGNOSIS — E7800 Pure hypercholesterolemia, unspecified: Secondary | ICD-10-CM

## 2013-05-11 DIAGNOSIS — K5909 Other constipation: Secondary | ICD-10-CM

## 2013-05-11 DIAGNOSIS — M159 Polyosteoarthritis, unspecified: Secondary | ICD-10-CM

## 2013-05-11 DIAGNOSIS — R259 Unspecified abnormal involuntary movements: Secondary | ICD-10-CM

## 2013-05-11 DIAGNOSIS — M545 Low back pain, unspecified: Secondary | ICD-10-CM

## 2013-05-11 DIAGNOSIS — I872 Venous insufficiency (chronic) (peripheral): Secondary | ICD-10-CM

## 2013-05-11 LAB — CBC WITH DIFFERENTIAL/PLATELET
Basophils Absolute: 0 10*3/uL (ref 0.0–0.1)
Basophils Relative: 0.6 % (ref 0.0–3.0)
EOS ABS: 0.1 10*3/uL (ref 0.0–0.7)
Eosinophils Relative: 1.7 % (ref 0.0–5.0)
HCT: 36.5 % (ref 36.0–46.0)
HEMOGLOBIN: 12.3 g/dL (ref 12.0–15.0)
LYMPHS PCT: 25.5 % (ref 12.0–46.0)
Lymphs Abs: 1.6 10*3/uL (ref 0.7–4.0)
MCHC: 33.6 g/dL (ref 30.0–36.0)
MCV: 97.2 fl (ref 78.0–100.0)
MONO ABS: 0.5 10*3/uL (ref 0.1–1.0)
Monocytes Relative: 7.5 % (ref 3.0–12.0)
NEUTROS ABS: 4.1 10*3/uL (ref 1.4–7.7)
Neutrophils Relative %: 64.7 % (ref 43.0–77.0)
Platelets: 247 10*3/uL (ref 150.0–400.0)
RBC: 3.76 Mil/uL — ABNORMAL LOW (ref 3.87–5.11)
RDW: 13 % (ref 11.5–14.6)
WBC: 6.3 10*3/uL (ref 4.5–10.5)

## 2013-05-11 LAB — LIPID PANEL
Cholesterol: 138 mg/dL (ref 0–200)
HDL: 51.9 mg/dL (ref >39.00)
LDL CALC: 64 mg/dL (ref 0–99)
TRIGLYCERIDES: 109 mg/dL (ref 0.0–149.0)
Total CHOL/HDL Ratio: 3
VLDL: 21.8 mg/dL (ref 0.0–40.0)

## 2013-05-11 LAB — BASIC METABOLIC PANEL
BUN: 15 mg/dL (ref 6–23)
CALCIUM: 9.6 mg/dL (ref 8.4–10.5)
CO2: 30 mEq/L (ref 19–32)
CREATININE: 0.7 mg/dL (ref 0.4–1.2)
Chloride: 102 mEq/L (ref 96–112)
GFR: 82.68 mL/min (ref >60.00)
Glucose, Bld: 86 mg/dL (ref 70–99)
Potassium: 4 mEq/L (ref 3.5–5.1)
SODIUM: 140 meq/L (ref 135–145)

## 2013-05-11 LAB — HEPATIC FUNCTION PANEL
ALK PHOS: 62 U/L (ref 39–117)
ALT: 14 U/L (ref 0–35)
AST: 18 U/L (ref 0–37)
Albumin: 4.1 g/dL (ref 3.5–5.2)
BILIRUBIN DIRECT: 0.1 mg/dL (ref 0.0–0.3)
BILIRUBIN TOTAL: 0.7 mg/dL (ref 0.3–1.2)
Total Protein: 6.4 g/dL (ref 6.0–8.3)

## 2013-05-11 LAB — IBC PANEL
IRON: 39 ug/dL — AB (ref 42–145)
Saturation Ratios: 19.3 % — ABNORMAL LOW (ref 20.0–50.0)
TRANSFERRIN: 144.5 mg/dL — AB (ref 212.0–360.0)

## 2013-05-11 LAB — TSH: TSH: 1.2 u[IU]/mL (ref 0.35–5.50)

## 2013-05-11 MED ORDER — CYANOCOBALAMIN 1000 MCG/ML IJ SOLN
1000.0000 ug | Freq: Once | INTRAMUSCULAR | Status: AC
  Administered 2013-05-11: 1000 ug via INTRAMUSCULAR

## 2013-05-11 MED ORDER — TRAMADOL HCL 50 MG PO TABS: 50.0000 mg | ORAL_TABLET | Freq: Three times a day (TID) | ORAL | Status: DC | PRN

## 2013-05-11 MED ORDER — DEXLANSOPRAZOLE 60 MG PO CPDR: DELAYED_RELEASE_CAPSULE | ORAL | Status: DC

## 2013-05-11 NOTE — Patient Instructions (Signed)
Today we updated your med list in our EPIC system...    Continue your current medications the same...    We refilled the meds you requested...  Today we did your follow up FASTING blood work...    We will contact you w/ the results when available...    We gave you your monthly B12 shot today...  Call for any questions or if we can be of service in any way... You should plan a follow up medical eval in about 6 months.Marland KitchenMarland Kitchen

## 2013-05-11 NOTE — Progress Notes (Signed)
Subjective:    Patient ID: [name], female    DOB: Sep 28, 1923, 80 y.o.   MRN: [id number]  HPI 80 y/o WF here for a follow up visit... she has mult med problems including:  HBP;  Cardiac arrhythmia;  VV/ Ven IOnsuffic;  Hyperchol;  HH- s/p nissen/ Constip;  UTIs;  DJD;  Osteoporosis;  Tremor;  Anxiety;  Hx of anemia...  ~  May 10, 2011:  50mo ROV & she continues stable at 80y/o no new complaints or concerns>     She continues to f/u w/ DrPatterson for GI> seen 10/12 for her dysphagia (s/p repair of paraesoph HH in 2005), GERD, chr anemia (Fe defic w/ neg work-up); she also had ventral hernia repaired 2007; she says she can't swallow anything bigger than a grain of rice, and she has vomiting episodes; he performed another EGD 10/12 for progressive solid food dysphagia> post op changes noted w/ persist lateral wall hernia, friable, food debris noted & inflamm polyp in pyloric channel; he changed her Prilosec to Dexilant 60mg /d & rec freq small meals etc; she says that GI cocktail from [facility] helps some; he continues to give her B12 shots Qmonth...     Other medical problems are stable> see prob list below; BP controlled on Metop & Hyzaar; denies cardiac arrhythmia on the dialtiazem; legs doing satis on salt restriction & support hose; notes some DJD esp hands which tend to go to sleep; she is back on the Fosamax for her Osteoporosis...  LABS 3/13:  FLP- at goals on Prav40;  Chems- wnl;  CBC- wnl w/ Hg=12.4;  TSH=1.73;  VitD=49  ~  November 12, 2011:  23mo ROV & [name] is stable overall, feeling OK, no new complaints or concerns;  BP controlled on 3 meds and her Lipids have been regulated w/ diet & Prav40...    She saw DrPatterson 7/13> hx paraesoph hernia repair, eats freq sm meals, has LLQ discomfort & severe divertics, on VitB12 shots, Fe & VitD orally + fiber...     We reviewed prob list, meds, xrays and labs> see below for updates >> OK Flu shots today...  ~  May 11, 2012:   74mo ROV & [name] notes that arthritis in her back is causing lots of problems "It's eating up my vertebrae"; she reports MRI that showed "I've got arthritis from my head to my toes"; on Tamadol as needed, ambulates w/ walker, eval by DrMortenson/ DrWhitfield=> refer to DrIbazedo & DrNewton for shots & not a surg candidate...    HBP>  On MetoprololER25, Diltiazem180 & Hyzaar50-12.5;  BP= 116/72, taking meds regularly & tol well;  Denies CP, palpit, dizzy, SOB, edema, etc...    Chol>  On Prav40 now;  [place] 3/14 shows TChol 140, TG 185, HDL 44, LDL 59; chol improved, needs better low fat diet...    GI>  Hx HH s/p nissen & constip on Dexilant60, Miralax, Senakot-S;  Doing well w/o heartburn, abd pain, swelling, n/v, change in BMs etc...    DJD, LBP> see above...    Osteop>  On Alendronate70, Calcium, MVI, Vit D2000 u daily;  She uses Tramadol for pain; last BMD 6/11 reviewed w/ TScore -2.6 in right FemNeck...    Tremor> w/ head titubations on Primadone 50mg  Tid & stable... Uses Midrin for HA...    Anxiety>  She manages satis & doesn't feel she needs meds...    Anemia> on Fe daily & B12 shots monthly; labs 3/14 show Hg= 13.0, Fe=86 (39%), B12>1500  We reviewed prob list, meds, xrays and labs> see below for updates >>   LABS 3/14:  FLP- ok on Prav40 x TG=185;  Chems- wnl;  CBC- wnl;  Fe=86 (39%sat); TSH=1.47;  B12>1500;  BNP=102...  ~  November 11, 2012:  42mo ROV & [name] is overall stable- notes some SOB w/ housework & she is remined to be more active, exercise program, etc...     BP controlled on MetoprololER25, Diltiazem180 & Hyzaar50-12.5;  BP= 120/62 & she denies CP, palpit, dizzy, syncope, edema, etc...    Chol is regulated w/ Prav40;  FLP 3/14 showed TChol 140, TG 185, HDL 44, LDL 59...    GI is followed by DrPatterson on GI cocktail prn, Dexilant60, Miralax, Senakot-S; she is s/p nissen; symptoms controlled as long as she takes her meds regularly...    She has a lot of arthritis at 55- on Tylenol  & Tramadol as needed; she ambulates w/ walker & cane, hard for her to bend over w/ back pain, etc- prev Ortho eval w/ MRI spine 2011 by DrRendall w/ sp stenosis & foraminal narrowing; she saw DrWhitfield 2/14 w/ several shots in back at that time...    Other medical issues as noted above- stable... B12 level is now wnl on the B12 shots...  We reviewed prob list, meds, xrays and labs> see below for updates >> OK Flu shot today...  ~  May 11, 2013:  55mo ROV & [name]'s CC is her arthritis pain, back pain, etc; she notes that DrWhitfield is planning some shots to help her back & legs...     HBP>  On MetoprololER25, Diltiazem180 & Hyzaar50-12.5;  BP= 132/72, taking meds regularly & tol well;  Denies CP, palpit, dizzy, SOB, edema, etc...    Chol>  On Prav40 now;  FLP 3/15 shows TChol 138, TG 109, HDL 52, LDL 64; chol improved, needs better low fat diet...    GI>  Hx HH s/p nissen & constip on Dexilant60, Miralax, Senakot-S;  Doing well w/o heartburn, abd pain, swelling, n/v, change in BMs etc...    DJD, LBP> lots of arthritis- on Tylenol & Tramadol prn; ambulates w/ walker & cane, hard to bend over w/ back pain, etc- prev Ortho eval w/ MRI spine 2011 by DrRendall w/ sp stenosis & foraminal narrowing; she saw DrWhitfield 2/14 w/ several shots in back...     Osteop>  On Alendronate70, Calcium, MVI, Vit D2000 u daily;  She uses Tramadol for pain; last BMD 6/11 reviewed w/ TScore -2.6 in right FemNeck...    Tremor> w/ head titubations on Primadone 50mg  Tid & stable... Uses Midrin for HA...    Anxiety>  She manages satis & doesn't feel she needs meds...    HxAnemia> on Fe daily & B12 shots monthly; labs 3/15 show Hg= 12.3, Fe=39 (19%sat)... We reviewed prob list, meds, xrays and labs> see below for updates >> meds refilled per request...   LABS 3/15:  FLP- at goals on Prav40;  Chems- wnl;  CBC- ok w/ Hg=12.3, Fe=39 (19%sat) & rec to continue Fe daily;  TSH=1.20...          Problem List:  HYPERTENSION  (ICD-401.9) - controlled on TOPROL XL 50- 1/2 tab daily, HYZAAR 50/d, and DILTIAZEM 180mg /d...  ~  BP under good control, tolerates meds well; & denies HA, visual changes, CP, palipit, dizziness, syncope, dyspnea, edema, etc... ~  9/13:  BP= 122/78 & she denies CP, palpit, SOB, edema... ~  3/14:  On MetoprololER25, Diltiazem180 &  Hyzaar50-12.5;  BP= 116/72, taking meds regularly & tol well;  Denies CP, palpit, dizzy, SOB, edema, etc. ~  9/14: BP controlled on MetoprololER25, Diltiazem180 & Hyzaar50-12.5;  BP= 120/62 & she denies CP, palpit, dizzy, syncope, edema, etc. ~  3/15: On MetoprololER25, Diltiazem180 & Hyzaar50-12.5;  BP= 132/72, taking meds regularly & tol well;  Denies CP, palpit, dizzy, SOB, edema, etc.  Hx of CARDIAC ARRHYTHMIA (ICD-427.9) - hx VTach in past, it was exercise induced... eval by DrBBrodie prev- normal cath... stable on meds... last seen 9/05 for pre-op clearance... ~  NuclearStressTest 9/05 showed no scar or ischemia, EF=70%, some mild ectopy...  VENOUS INSUFFICIENCY (ICD-459.81) - she has mild intermittent edema- treated w/ low sodium diet, elevation, support hose...  HYPERCHOLESTEROLEMIA, MILD (ICD-272.0) - prev on diet alone; PRAVASTATIN 40mg /d started 4/12. ~  FLP 3/10 showed TChol 204, TG 116, HDL 46, LDL 134... she prefers diet Rx. ~  FLP 4/11 showed TChol 206, TG 117, HDL 48, LDL 142... offered meds, she prefers diet. ~  FLP 4/12 on diet alone showed TChol 238, TG 121, HDL 55, LDL 158... rec to try Prav40. ~  FLP 3/13 on Prav40 showed TChol 139, TG 94, HDL 56, LDL 65... Continue same... ~  [place] 3/14 on Prav40 showed TChol 140, TG 185, HDL 44, LDL 59... Needs better low fat diet... ~  FLP 3/15 on Prav40 showed TChol 138, TG 109, HDL 52, LDL 64  HIATAL HERNIA (ICD-553.3) - on DEXILANT 60mg /d... doing well w/ intermittent reflux symptoms, but no dysphagia, etc... she is s/p nissen fundoplication for a large HH... ~  last EGD 3/08 showed prev HH surg, mild stricture,  some gastritis... ~  Also takes a GI Cocktail as needed per DRP, and B12 shots monthly... ~  9/14: GI is followed by DrPatterson on GI cocktail prn, Dexilant60, Miralax, Senakot-S; she is s/p nissen; symptoms controlled as long as she takes her meds regularly.  CONSTIPATION, CHRONIC (ICD-564.09) - on Miralax, & Senakot-S Prn for constip... ~  last colonoscopy 9/00 showed marked tortuous redundant colon, ?adhesions & was incomplete; subseq Ba enema was unremarkable w/o any lesions identified...  Hx of URINARY TRACT INFECTION (ICD-599.0) - she has some bladder symptoms- going q2-3H during the night, but is apparently OK during the day... she refuses urology referral & doesn't want medication trial...  DEGENERATIVE JOINT DISEASE, GENERALIZED (ICD-715.00) - on TRAMADOL 50mg  Prn (using about 2/d)... LBP eval by DrMortenson/ Whitfield w/ shots in her knee, and back she says...  ~  She has a lot of arthritis at 6- on Tylenol & Tramadol as needed; she ambulates w/ walker & cane, hard for her to bend over w/ back pain, etc- prev Ortho eval w/ MRI spine 2011 by DrRendall w/ sp stenosis & foraminal narrowing; she saw DrWhitfield 2/14 w/ several shots in back at that time...  OSTEOPOROSIS (ICD-733.00) - prev on Fosamax but pt DC'd this w/ prev GIB... BMD 3/09 showed TScores -1.6 in Spine, -2.4 in [facility]... started BONIVA 150mg /mo and tolerating this satis- just c/o $$$ in the donut hole... also rec to take CALTRATE Bid and MVI... ~  labs 3/10 showed Vit D = 28... rec OTC Vit D supplement 1000 u daily. ~  labs 3/11 showed Vit D = 24... rec incr to 2000 u vit D daily... ~  Labs 4/12 showed Vit D = 47... Stable on 2000u daily. ~  Labs 3/13 showed Vit D level = 49... Continue same...  TREMOR (ICD-781.0) - she has head  titubations on PRIMADONE 50mg Tid...  ANXIETY (ICD-300.00)  ANEMIA (ICD-285.9) - she had an Fe defic anemia in 2008 w/ GI eval by DrGessner- neg EGD, neg colon, capsule endoscopy without lesions  seen... she takes REPLIVA daily, but insurance won't pay- therefore change to OTC Fe supplement...  ~  labs 3/09 showed Hg= 12.9, and FE= 80... ~  labs 3/10 showed Hg= 13.1, Fe= 52... rec> OTC FeSO4 supplement... ~  labs 3/11 showed Hg= 13.2 ~  Labs 3/13 showed Hg= 12.4, MCV= 98 ~  Labs 3/14 showed Hg= 13.0, Fe=86 (39%sat)... ~  Labs 3/15 showed Hg= 12.3, Fe=39 (19%sat)   Past Surgical History  Procedure Laterality Date  . Nissen fundoplication    . Cholecystectomy    . Total abdominal hysterectomy    . Colonoscopy    . Upper gastrointestinal endoscopy      Outpatient Encounter Prescriptions as of 05/11/2013  Medication Sig  . alendronate (FOSAMAX) 70 MG tablet TAKE ONE TABLET BY MOUTH EVERY 7 DAYS. TAKE WITH A FULL GLASS OF WATER ON AN EMPTY STOMACH  . AMBULATORY NON FORMULARY MEDICATION Medication Name: GI Cocktail with 20cc Maalox, 10cc Donnatal, 20cc Viscous Lidocaine 2%, take 30cc dose q8hr as needed for GI upset.  Marland Kitchen APAP-Isometheptene-Dichloral 325-65-100 MG per capsule Take 1 capsule by mouth every 6 (six) hours as needed.  . calcium-vitamin D (OSCAL 500/200 D-3) 500-200 MG-UNIT per tablet Take 1 tablet by mouth daily.    . Cholecalciferol (VITAMIN D3) 2000 UNITS TABS Take 1 tablet by mouth daily.    Marland Kitchen DEXILANT 60 MG capsule TAKE ONE CAPSULE  BY MOUTH ONCE DAILY  . diltiazem (CARDIZEM CD) 180 MG 24 hr capsule TAKE ONE CAPSULE BY MOUTH EVERY DAY  . Ferrous Sulfate (IRON) 325 (65 FE) MG TABS Take 1 tablet by mouth daily.    Marland Kitchen losartan-hydrochlorothiazide (HYZAAR) 50-12.5 MG per tablet TAKE ONE TABLET BY MOUTH EVERY DAY  . metoprolol succinate (TOPROL-XL) 50 MG 24 hr tablet TAKE ONE-HALF TABLET BY MOUTH EVERY DAY  . Multiple Vitamins-Minerals (PRESERVISION AREDS 2 PO) Take 2 capsules by mouth daily.    . polyethylene glycol powder (GLYCOLAX/MIRALAX) powder MIX 17 GRAMS IN LIQUID AND DRINK DAILY  . pravastatin (PRAVACHOL) 40 MG tablet TAKE ONE TABLET BY MOUTH ONCE DAILY IN THE  EVENING  . primidone (MYSOLINE) 50 MG tablet TAKE ONE TABLET BY MOUTH THREE TIMES DAILY  . traMADol (ULTRAM) 50 MG tablet Take 1 tablet (50 mg total) by mouth 3 (three) times daily as needed for pain.  . [DISCONTINUED] DEXILANT 60 MG capsule TAKE ONE CAPSULE BY MOUTH ONCE DAILY (PLEASE  MAKE  AN  APPOINTMENT  FOR  FURTHER  REFILLS)    No Known Allergies   Current Medications, Allergies, Past Medical History, Past Surgical History, Family History, and Social History were reviewed in [employer] record.   Review of Systems        See HPI - all other systems neg except as noted...  The patient complains of decreased hearing and dyspnea on exertion.  The patient denies anorexia, fever, weight loss, weight gain, vision loss, hoarseness, chest pain, syncope, peripheral edema, prolonged cough, headaches, hemoptysis, abdominal pain, melena, hematochezia, severe indigestion/heartburn, hematuria, incontinence, muscle weakness, suspicious skin lesions, transient blindness, difficulty walking, depression, unusual weight change, abnormal bleeding, enlarged lymph nodes, and angioedema.     Objective:   Physical Exam      WD, WN, 80 y/o WF in NAD... GENERAL:  Alert & oriented; pleasant &  cooperative... HEENT:  [place]/AT, EOM-wnl, PERRLA, EACs-clear, TMs-wnl, NOSE-clear, THROAT-clear & wnl. NECK:  Supple w/ fairROM; no JVD; normal carotid impulses w/o bruits; no thyromegaly or nodules palpated; no lymphadenopathy. CHEST:  Clear to P & A; without wheezes/ rales/ or rhonchi. HEART:  Regular Rhythm; without murmurs/ rubs/ or gallops. ABDOMEN:  Soft & nontender; normal bowel sounds; no organomegaly or masses detected. EXT: without deformities, mod arthritic changes; no varicose veins/ +venous insuffic/ no edema. NEURO:  CN's intact;  no focal neuro deficits... DERM:  No lesions noted; no rash etc...  RADIOLOGY DATA:  Reviewed in the EPIC EMR & discussed w/ the patient...  LABORATORY  DATA:  Reviewed in the EPIC EMR & discussed w/ the patient...   Assessment & Plan:    HBP>  On MetoprololER25, Diltiazem180 & Hyzaar50-12.5;  BP controlled on meds, continue same!     Chol>  On Prav40 now;  FLP is much improved & now at goals on the Prav40- continue same...     GI>  Hx HH s/p Nissen & constip on Dexilant 60mg /d, Miralax, Senakot-S;  Doing well w/o heartburn, abd pain, n/v, change in BMs etc...     Osteop>  On Alendronate, calcium, MVI, Vit D; 2000 u daily;  She uses Tramadol for pain; Last BMD 6/11 reviewed w/ TScore -2.6 in right FemNeck...     Tremor> w/ head titubations on Primadone 50mg  Tid & stable...      Anxiety>  She manages well & doesn't require meds...     Anemia> on Fe daily; Hg 3/15 was down sl to 12.3 & rec to continue Fe supplement...   Patient's Medications  New Prescriptions   No medications on file  Previous Medications   AMBULATORY NON FORMULARY MEDICATION    Medication Name: GI Cocktail with 20cc Maalox, 10cc Donnatal, 20cc Viscous Lidocaine 2%, take 30cc dose q8hr as needed for GI upset.   APAP-ISOMETHEPTENE-DICHLORAL 325-65-100 MG PER CAPSULE    Take 1 capsule by mouth every 6 (six) hours as needed.   CALCIUM-VITAMIN D (OSCAL 500/200 D-3) 500-200 MG-UNIT PER TABLET    Take 1 tablet by mouth daily.     CHOLECALCIFEROL (VITAMIN D3) 2000 UNITS TABS    Take 1 tablet by mouth daily.     DILTIAZEM (CARDIZEM CD) 180 MG 24 HR CAPSULE    TAKE ONE CAPSULE BY MOUTH EVERY DAY   FERROUS SULFATE (IRON) 325 (65 FE) MG TABS    Take 1 tablet by mouth daily.     METOPROLOL SUCCINATE (TOPROL-XL) 50 MG 24 HR TABLET    TAKE ONE-HALF TABLET BY MOUTH EVERY DAY   MULTIPLE VITAMINS-MINERALS (PRESERVISION AREDS 2 PO)    Take 2 capsules by mouth daily.     POLYETHYLENE GLYCOL POWDER (GLYCOLAX/MIRALAX) POWDER    MIX 17 GRAMS IN LIQUID AND DRINK DAILY   PRAVASTATIN (PRAVACHOL) 40 MG TABLET    TAKE ONE TABLET BY MOUTH ONCE DAILY IN THE EVENING  Modified Medications    Modified Medication Previous Medication   ALENDRONATE (FOSAMAX) 70 MG TABLET alendronate (FOSAMAX) 70 MG tablet      TAKE ONE TABLET BY MOUTH ONCE A WEEK ON AN EMPTY STOMACH AND  WITH  A  FULL  GLASS  OF  WATER    TAKE ONE TABLET BY MOUTH ONCE A WEEK (TAKE  WITH  A  FULL  GLASS  OF  WATER  ON  AN  EMPTY  STOMACH)   DEXLANSOPRAZOLE (DEXILANT) 60 MG CAPSULE DEXILANT 60 MG capsule  TAKE ONE CAPSULE  BY MOUTH ONCE DAILY    TAKE ONE CAPSULE  BY MOUTH ONCE DAILY   LOSARTAN-HYDROCHLOROTHIAZIDE (HYZAAR) 50-12.5 MG PER TABLET losartan-hydrochlorothiazide (HYZAAR) 50-12.5 MG per tablet      TAKE ONE TABLET BY MOUTH ONCE DAILY    TAKE ONE TABLET BY MOUTH EVERY DAY   PRIMIDONE (MYSOLINE) 50 MG TABLET primidone (MYSOLINE) 50 MG tablet      TAKE ONE TABLET BY MOUTH THREE TIMES DAILY    TAKE ONE TABLET BY MOUTH THREE TIMES DAILY   TRAMADOL (ULTRAM) 50 MG TABLET traMADol (ULTRAM) 50 MG tablet      Take 1 tablet (50 mg total) by mouth 3 (three) times daily as needed.    Take 1 tablet (50 mg total) by mouth 3 (three) times daily as needed for pain.  Discontinued Medications   ALENDRONATE (FOSAMAX) 70 MG TABLET    TAKE ONE TABLET BY MOUTH EVERY 7 DAYS. TAKE WITH A FULL GLASS OF WATER ON AN EMPTY STOMACH   DEXILANT 60 MG CAPSULE    TAKE ONE CAPSULE BY MOUTH ONCE DAILY (PLEASE  MAKE  AN  APPOINTMENT  FOR  FURTHER  REFILLS)

## 2013-06-03 ENCOUNTER — Other Ambulatory Visit: Payer: Self-pay | Admitting: Pulmonary Disease

## 2013-06-16 ENCOUNTER — Other Ambulatory Visit: Payer: Self-pay | Admitting: Pulmonary Disease

## 2013-06-30 ENCOUNTER — Other Ambulatory Visit: Payer: Self-pay | Admitting: Pulmonary Disease

## 2013-07-14 ENCOUNTER — Other Ambulatory Visit: Payer: Self-pay | Admitting: Pulmonary Disease

## 2013-07-19 ENCOUNTER — Ambulatory Visit (INDEPENDENT_AMBULATORY_CARE_PROVIDER_SITE_OTHER): Payer: Medicare Other

## 2013-07-19 ENCOUNTER — Telehealth: Payer: Self-pay | Admitting: Pulmonary Disease

## 2013-07-19 DIAGNOSIS — E538 Deficiency of other specified B group vitamins: Secondary | ICD-10-CM

## 2013-07-19 NOTE — Telephone Encounter (Signed)
Called and spoke with pt and she is aware that she will need to establish with a new primary care doctor prior to October.  i have given her the number to [facility] to call tomorrow to set up with a new PCP.  Pt is aware and nothing further is needed.

## 2013-07-19 NOTE — Telephone Encounter (Signed)
I called spoke with pt. She reports she is due for appt in October w/ SN. She wants to know if she can schedule this. Please advise thanks

## 2013-07-21 MED ORDER — CYANOCOBALAMIN 1000 MCG/ML IJ SOLN
1000.0000 ug | Freq: Once | INTRAMUSCULAR | Status: AC
  Administered 2013-07-21: 1000 ug via INTRAMUSCULAR

## 2013-08-14 ENCOUNTER — Other Ambulatory Visit: Payer: Self-pay | Admitting: Pulmonary Disease

## 2013-08-18 ENCOUNTER — Other Ambulatory Visit: Payer: Self-pay | Admitting: Pulmonary Disease

## 2013-08-18 MED ORDER — LOSARTAN POTASSIUM-HCTZ 50-12.5 MG PO TABS: ORAL_TABLET | ORAL | Status: DC

## 2013-08-18 MED ORDER — DILTIAZEM HCL ER COATED BEADS 180 MG PO CP24: ORAL_CAPSULE | ORAL | Status: DC

## 2013-08-18 MED ORDER — METOPROLOL SUCCINATE ER 50 MG PO TB24: ORAL_TABLET | ORAL | Status: DC

## 2013-08-18 MED ORDER — PRIMIDONE 50 MG PO TABS: ORAL_TABLET | ORAL | Status: DC

## 2013-08-24 ENCOUNTER — Ambulatory Visit (INDEPENDENT_AMBULATORY_CARE_PROVIDER_SITE_OTHER): Payer: Medicare Other

## 2013-08-24 DIAGNOSIS — E538 Deficiency of other specified B group vitamins: Secondary | ICD-10-CM

## 2013-08-25 MED ORDER — CYANOCOBALAMIN 1000 MCG/ML IJ SOLN
1000.0000 ug | Freq: Once | INTRAMUSCULAR | Status: AC
  Administered 2013-08-25: 1000 ug via INTRAMUSCULAR

## 2013-09-12 ENCOUNTER — Other Ambulatory Visit: Payer: Self-pay | Admitting: Pulmonary Disease

## 2013-09-20 ENCOUNTER — Other Ambulatory Visit: Payer: Self-pay | Admitting: Pulmonary Disease

## 2013-09-23 ENCOUNTER — Ambulatory Visit (INDEPENDENT_AMBULATORY_CARE_PROVIDER_SITE_OTHER): Payer: Medicare Other | Admitting: Family Medicine

## 2013-09-23 ENCOUNTER — Telehealth: Payer: Self-pay

## 2013-09-23 ENCOUNTER — Encounter: Payer: Self-pay | Admitting: Family Medicine

## 2013-09-23 VITALS — BP 130/64 | HR 71 | Temp 97.6°F | Resp 16 | Ht 60.0 in | Wt 153.0 lb

## 2013-09-23 DIAGNOSIS — R609 Edema, unspecified: Secondary | ICD-10-CM

## 2013-09-23 DIAGNOSIS — R519 Headache, unspecified: Secondary | ICD-10-CM

## 2013-09-23 DIAGNOSIS — G44309 Post-traumatic headache, unspecified, not intractable: Secondary | ICD-10-CM

## 2013-09-23 DIAGNOSIS — R51 Headache: Principal | ICD-10-CM

## 2013-09-23 DIAGNOSIS — R0602 Shortness of breath: Secondary | ICD-10-CM

## 2013-09-23 DIAGNOSIS — R600 Localized edema: Secondary | ICD-10-CM

## 2013-09-23 DIAGNOSIS — G8929 Other chronic pain: Secondary | ICD-10-CM

## 2013-09-23 DIAGNOSIS — S0990XS Unspecified injury of head, sequela: Secondary | ICD-10-CM

## 2013-09-23 LAB — COMPREHENSIVE METABOLIC PANEL
ALBUMIN: 4.3 g/dL (ref 3.5–5.2)
ALK PHOS: 77 U/L (ref 39–117)
ALT: 8 U/L (ref 0–35)
AST: 14 U/L (ref 0–37)
BUN: 13 mg/dL (ref 6–23)
CO2: 29 mEq/L (ref 19–32)
Calcium: 9.9 mg/dL (ref 8.4–10.5)
Chloride: 101 mEq/L (ref 96–112)
Creat: 0.76 mg/dL (ref 0.50–1.10)
Glucose, Bld: 90 mg/dL (ref 70–99)
POTASSIUM: 3.9 meq/L (ref 3.5–5.3)
SODIUM: 141 meq/L (ref 135–145)
Total Bilirubin: 0.4 mg/dL (ref 0.2–1.2)
Total Protein: 6.8 g/dL (ref 6.0–8.3)

## 2013-09-23 LAB — BRAIN NATRIURETIC PEPTIDE: Brain Natriuretic Peptide: 32.4 pg/mL (ref 0.0–100.0)

## 2013-09-23 MED ORDER — HYDROCHLOROTHIAZIDE 12.5 MG PO TABS: ORAL_TABLET | ORAL | Status: DC

## 2013-09-23 MED ORDER — BUTALBITAL-APAP-CAFFEINE 50-325-40 MG PO TABS: 1.0000 | ORAL_TABLET | Freq: Four times a day (QID) | ORAL | Status: DC | PRN

## 2013-09-23 MED ORDER — ISOMETHEPTENE-DICHLORAL-APAP 65-100-325 MG PO CAPS: 1.0000 | ORAL_CAPSULE | Freq: Four times a day (QID) | ORAL | Status: DC | PRN

## 2013-09-23 NOTE — Progress Notes (Signed)
[facility] [address] [place] [address] [phone]  Date:  09/23/2013   Name:  [name]   DOB:  02-21-1923   MRN:  [id number]  PCP:  Noralee Space, MD    Chief Complaint: Headache and Joint Swelling   History of Present Illness:  [name] is a 80 y.o. very pleasant female patient who presents with the following:  Here as a new patient today.  She had been a long-time pt of Dr. Lenna Gilford but he is now going to a speciality practice of some sort.  She is going to see Dr. Diona Browner but her appt is not for a few months so she came to see me today with a few concerns.   She has complaint of headaches off an on for years.  She had been using some medication from Dr. Lenna Gilford- nodolor (midrin).  She rarely uses this and still has her original #30 from 2013.   She has had swelling of her bilateral LE for a couple of weeks.  She does have some pain, but "not as bad as you would think."  They do go down during the night but swell again in the morning.   She does have SOB with exertion, but this is not new.  She does like to sleep with her head elevated due to histoyr of reflux- this is not new.  No chest pain  Patient Active Problem List   Diagnosis Date Noted  . LBP (low back pain) 05/11/2012  . Vitamin B 12 deficiency 05/11/2012  . Dysphagia, unspecified 12/17/2010  . Esophageal reflux 12/17/2010  . Abdominal hernia 12/17/2010  . Dysphagia 12/06/2010  . Anemia, iron deficiency 12/06/2010  . Edema 12/04/2010  . Weakness 05/18/2010  . HYPERCHOLESTEROLEMIA, MILD 11/19/2008  . VENOUS INSUFFICIENCY 11/14/2007  . HYPERTENSION 05/14/2007  . CARDIAC ARRHYTHMIA 05/14/2007  . CONSTIPATION, CHRONIC 05/14/2007  . URINARY TRACT INFECTION 05/14/2007  . OSTEOPOROSIS 05/14/2007  . TREMOR 05/14/2007  . ANEMIA 12/18/2006  . ANXIETY 12/18/2006  . HIATAL HERNIA 12/18/2006  . DEGENERATIVE JOINT DISEASE, GENERALIZED 12/18/2006    Past Medical History  Diagnosis  Date  . Hypertension   . Cardiac dysrhythmia, unspecified   . Venous insufficiency   . Pure hypercholesterolemia   . Hiatal hernia   . Chronic constipation   . Urinary tract infection, site not specified   . Generalized osteoarthrosis, unspecified site   . Osteoporosis   . Abnormal involuntary movements(781.0)   . Anxiety state, unspecified   . Anemia   . DJD (degenerative joint disease)   . Tortuous colon   . Pelvic adhesions   . Esophageal stricture   . Postgastrectomy syndrome   . GERD (gastroesophageal reflux disease)   . Blood transfusion   . Vitamin B12 deficiency     Past Surgical History  Procedure Laterality Date  . Nissen fundoplication    . Cholecystectomy    . Total abdominal hysterectomy    . Colonoscopy    . Upper gastrointestinal endoscopy      History  Substance Use Topics  . Smoking status: Never Smoker   . Smokeless tobacco: Never Used  . Alcohol Use: No    Family History  Problem Relation Age of Onset  . Cancer Mother     unknown type  . Heart disease Brother     No Known Allergies  Medication list has been reviewed and updated.  Current Outpatient Prescriptions on File Prior to Visit  Medication  Sig Dispense Refill  . alendronate (FOSAMAX) 70 MG tablet TAKE 1 TABLET BY MOUTH ONCE WEEKLY ON AN EMPTY STOMACH AND WITH A FULL GLASS OF WATER.  4 tablet  0  . APAP-Isometheptene-Dichloral 325-65-100 MG per capsule Take 1 capsule by mouth every 6 (six) hours as needed.  30 capsule  0  . calcium-vitamin D (OSCAL 500/200 D-3) 500-200 MG-UNIT per tablet Take 1 tablet by mouth daily.        Marland Kitchen dexlansoprazole (DEXILANT) 60 MG capsule TAKE ONE CAPSULE  BY MOUTH ONCE DAILY  30 capsule  6  . diltiazem (CARDIZEM CD) 180 MG 24 hr capsule TAKE ONE CAPSULE BY MOUTH EVERY DAY  90 capsule  3  . Ferrous Sulfate (IRON) 325 (65 FE) MG TABS Take 1 tablet by mouth daily.        Marland Kitchen losartan-hydrochlorothiazide (HYZAAR) 50-12.5 MG per tablet TAKE ONE TABLET BY MOUTH  ONCE DAILY  90 tablet  3  . metoprolol succinate (TOPROL-XL) 50 MG 24 hr tablet TAKE ONE-HALF TABLET BY MOUTH EVERY DAY  90 tablet  3  . pravastatin (PRAVACHOL) 40 MG tablet TAKE 1 TABLET BY MOUTH ONCE DAILY IN THE EVENING.  90 tablet  0  . primidone (MYSOLINE) 50 MG tablet TAKE ONE TABLET BY MOUTH THREE TIMES DAILY  270 tablet  3  . traMADol (ULTRAM) 50 MG tablet Take 1 tablet (50 mg total) by mouth 3 (three) times daily as needed.  90 tablet  5  . AMBULATORY NON FORMULARY MEDICATION Medication Name: GI Cocktail with 20cc Maalox, 10cc Donnatal, 20cc Viscous Lidocaine 2%, take 30cc dose q8hr as needed for GI upset.  1200 mL  6  . Cholecalciferol (VITAMIN D3) 2000 UNITS TABS Take 1 tablet by mouth daily.        Marland Kitchen diltiazem (CARDIZEM CD) 180 MG 24 hr capsule TAKE ONE CAPSULE BY MOUTH ONCE DAILY  90 capsule  1  . Multiple Vitamins-Minerals (PRESERVISION AREDS 2 PO) Take 2 capsules by mouth daily.        . polyethylene glycol powder (GLYCOLAX/MIRALAX) powder MIX 17 GRAMS IN LIQUID AND DRINK DAILY  255 g  2  . [DISCONTINUED] diltiazem (DILACOR XR) 180 MG 24 hr capsule Take 1 capsule (180 mg total) by mouth daily.  90 capsule  3   No current facility-administered medications on file prior to visit.    Review of Systems:  As per HPI- otherwise negative.  Physical Examination: Filed Vitals:   09/23/13 0838  BP: 130/64  Pulse: 71  Temp: 97.6 F (36.4 C)  Resp: 16   Filed Vitals:   09/23/13 0838  Height: 5' (1.524 m)  Weight: 153 lb (69.4 kg)   Body mass index is 29.88 kg/(m^2). Ideal Body Weight: Weight in (lb) to have BMI = 25: 127.7  GEN: WDWN, NAD, Non-toxic, A & O x 3, older lady who looks well and is here with her daughter today  HEENT: Atraumatic, Normocephalic. Neck supple. No masses, No LAD. Ears and Nose: No external deformity. CV: RRR, No M/G/R. No JVD. No thrill. No extra heart sounds. PULM: CTA B, no wheezes, crackles, rhonchi. No retractions. No resp. distress. No  accessory muscle use. ABD: S, NT, ND EXTR: No c/c/e NEURO Normal gait. Normal strength, sensation and DTR all extremities.   PSYCH: Normally interactive. Conversant. Not depressed or anxious appearing.  Calm demeanor.  She has 1+ pitting edema both LE.  No cords or swelling of her calf muscles  Assessment and Plan: SOB (  shortness of breath) - Plan: Brain natriuretic peptide  Pedal edema - Plan: Comprehensive metabolic panel, hydrochlorothiazide (HYDRODIURIL) 12.5 MG tablet  Chronic nonintractable headache, unspecified headache type - Plan: DISCONTINUED: APAP-Isometheptene-Dichloral 325-65-100 MG per capsule   Cautioned not to combine other pain medications with tramadol.  Await labs and if ok will plan to add diuretic to her regimen to help with her swelling.    Signed Kathleen Blinks, MD  [place] [phone] (daughter)  Results for orders placed in visit on 09/23/13  BRAIN NATRIURETIC PEPTIDE      Result Value Ref Range   Brain Natriuretic Peptide 32.4  0.0 - 100.0 pg/mL  COMPREHENSIVE METABOLIC PANEL      Result Value Ref Range   Sodium 141  135 - 145 mEq/L   Potassium 3.9  3.5 - 5.3 mEq/L   Chloride 101  96 - 112 mEq/L   CO2 29  19 - 32 mEq/L   Glucose, Bld 90  70 - 99 mg/dL   BUN 13  6 - 23 mg/dL   Creat 0.76  0.50 - 1.10 mg/dL   Total Bilirubin 0.4  0.2 - 1.2 mg/dL   Alkaline Phosphatase 77  39 - 117 U/L   AST 14  0 - 37 U/L   ALT 8  0 - 35 U/L   Total Protein 6.8  6.0 - 8.3 g/dL   Albumin 4.3  3.5 - 5.2 g/dL   Calcium 9.9  8.4 - 10.5 mg/dL   Called her daughter with labs- all look ok.  Will add a 2nd HCTZ dose that she can take on a prn basis.  She generally takes her medications in the am- she can add this 2nd dose of hctz at noon/ early afternoon on days when she is especially swollen.  However need to be cautious of lightheadedness.

## 2013-09-23 NOTE — Telephone Encounter (Signed)
Alternative to the prescribed medication requested please advise.

## 2013-09-23 NOTE — Telephone Encounter (Signed)
[place] - Pt's daughter called.  Says the medicine you called in for Nitza's head is unavailable at the pharmacy and that if they order it, it will be very expensive.  Can you call in something different?  [phone]

## 2013-09-23 NOTE — Telephone Encounter (Signed)
Changed to fiorcet- checked with pharmD that there is little chance of any interaction with her diltiazem.  Called daughter and let her know

## 2013-09-23 NOTE — Patient Instructions (Signed)
Good to see you today.  I will be in touch with your labs.  If you have any difficulty in the meantime please let me know.  Assuming all looks ok with your labs we can start a mild fluid pill for your legs as needed.  Also try to elevate your legs during the day when you can.    Use the medication for your headaches as you have done in the past.  Remember it can make you a little drowsy so do not drive while taking this

## 2013-10-18 ENCOUNTER — Other Ambulatory Visit: Payer: Self-pay | Admitting: Pulmonary Disease

## 2013-10-20 ENCOUNTER — Other Ambulatory Visit: Payer: Self-pay | Admitting: Family Medicine

## 2013-10-20 DIAGNOSIS — M810 Age-related osteoporosis without current pathological fracture: Secondary | ICD-10-CM

## 2013-10-21 ENCOUNTER — Encounter: Payer: Self-pay | Admitting: Gastroenterology

## 2013-10-21 NOTE — Telephone Encounter (Signed)
Dr Lorelei Pont, you haven't Rxd this for pt before, but see that she is no longer seeing Dr Lenna Gilford who was managing this. Do you want to RF or have pt RTC?

## 2013-12-03 ENCOUNTER — Other Ambulatory Visit: Payer: Self-pay | Admitting: Pulmonary Disease

## 2014-01-03 ENCOUNTER — Telehealth: Payer: Self-pay

## 2014-01-03 NOTE — Telephone Encounter (Signed)
Mauricio Po pts daughter left v/m requesting changing Dexilant to a less expensive substitute med called Pantoprazole. Pt is pt of Dr Lenna Gilford and has new pt to establish visit with Dr Diona Browner 03/03/14. Malachy Mood will ck with Dr Jeannine Kitten office prior to coming in for appt.

## 2014-01-08 ENCOUNTER — Ambulatory Visit (INDEPENDENT_AMBULATORY_CARE_PROVIDER_SITE_OTHER): Payer: Medicare Other | Admitting: Family Medicine

## 2014-01-08 VITALS — BP 138/64 | HR 63 | Temp 97.3°F | Resp 16 | Ht 60.5 in | Wt 156.0 lb

## 2014-01-08 DIAGNOSIS — I872 Venous insufficiency (chronic) (peripheral): Secondary | ICD-10-CM

## 2014-01-08 DIAGNOSIS — K219 Gastro-esophageal reflux disease without esophagitis: Secondary | ICD-10-CM

## 2014-01-08 DIAGNOSIS — R600 Localized edema: Secondary | ICD-10-CM

## 2014-01-08 LAB — BASIC METABOLIC PANEL
BUN: 16 mg/dL (ref 6–23)
CO2: 29 mEq/L (ref 19–32)
CREATININE: 0.71 mg/dL (ref 0.50–1.10)
Calcium: 9.7 mg/dL (ref 8.4–10.5)
Chloride: 102 mEq/L (ref 96–112)
Glucose, Bld: 92 mg/dL (ref 70–99)
Potassium: 3.9 mEq/L (ref 3.5–5.3)
Sodium: 140 mEq/L (ref 135–145)

## 2014-01-08 MED ORDER — OMEPRAZOLE 20 MG PO CPDR: 20.0000 mg | DELAYED_RELEASE_CAPSULE | Freq: Every day | ORAL | Status: DC

## 2014-01-08 MED ORDER — FUROSEMIDE 20 MG PO TABS: ORAL_TABLET | ORAL | Status: DC

## 2014-01-08 NOTE — Progress Notes (Signed)
[facility] [address] [place] [address] [phone]  Date:  01/08/2014   Name:  [name]   DOB:  26-Feb-1923   MRN:  [id number]  PCP:  Noralee Space, MD    Chief Complaint: Follow-up   History of Present Illness:  [name] is a 80 y.o. very pleasant female patient who presents with the following:  She is here today to discuss swelling in her feet and legs.  She has had this issue for a few years, but it got worse over the last 6 months or so In august we had increased her HCTX to twice a day but this did not seem to help.  Her BNP looked fine at that time So far they have not tried any compression hose.   Normal renal function at her recent labs  They are using dexilant for her GERD- however this is too expensive and she would like an alternative if possible Seen today with her daughter [name] Patient Active Problem List   Diagnosis Date Noted  . LBP (low back pain) 05/11/2012  . Vitamin B 12 deficiency 05/11/2012  . Dysphagia, unspecified 12/17/2010  . Esophageal reflux 12/17/2010  . Abdominal hernia 12/17/2010  . Dysphagia 12/06/2010  . Anemia, iron deficiency 12/06/2010  . Edema 12/04/2010  . Weakness 05/18/2010  . HYPERCHOLESTEROLEMIA, MILD 11/19/2008  . VENOUS INSUFFICIENCY 11/14/2007  . HYPERTENSION 05/14/2007  . CARDIAC ARRHYTHMIA 05/14/2007  . CONSTIPATION, CHRONIC 05/14/2007  . URINARY TRACT INFECTION 05/14/2007  . OSTEOPOROSIS 05/14/2007  . TREMOR 05/14/2007  . ANEMIA 12/18/2006  . ANXIETY 12/18/2006  . HIATAL HERNIA 12/18/2006  . DEGENERATIVE JOINT DISEASE, GENERALIZED 12/18/2006    Past Medical History  Diagnosis Date  . Hypertension   . Cardiac dysrhythmia, unspecified   . Venous insufficiency   . Pure hypercholesterolemia   . Hiatal hernia   . Chronic constipation   . Urinary tract infection, site not specified   . Generalized osteoarthrosis, unspecified site   . Osteoporosis   . Abnormal  involuntary movements(781.0)   . Anxiety state, unspecified   . Anemia   . DJD (degenerative joint disease)   . Tortuous colon   . Pelvic adhesions   . Esophageal stricture   . Postgastrectomy syndrome   . GERD (gastroesophageal reflux disease)   . Blood transfusion   . Vitamin B12 deficiency     Past Surgical History  Procedure Laterality Date  . Nissen fundoplication    . Cholecystectomy    . Total abdominal hysterectomy    . Colonoscopy    . Upper gastrointestinal endoscopy      History  Substance Use Topics  . Smoking status: Never Smoker   . Smokeless tobacco: Never Used  . Alcohol Use: No    Family History  Problem Relation Age of Onset  . Cancer Mother     unknown type  . Heart disease Brother     No Known Allergies  Medication list has been reviewed and updated.  Current Outpatient Prescriptions on File Prior to Visit  Medication Sig Dispense Refill  . alendronate (FOSAMAX) 70 MG tablet TAKE 1 TABLET BY MOUTH ONCE WEEKLY ON AN EMPTY STOMACH AND WITH A FULL GLASS OF WATER. 4 tablet 3  . AMBULATORY NON FORMULARY MEDICATION Medication Name: GI Cocktail with 20cc Maalox, 10cc Donnatal, 20cc Viscous Lidocaine 2%, take 30cc dose q8hr as needed for GI upset. 1200 mL 6  . butalbital-acetaminophen-caffeine (FIORICET, ESGIC) 50-325-40 MG per tablet Take  1 tablet by mouth every 6 (six) hours as needed for headache. 20 tablet 0  . calcium-vitamin D (OSCAL 500/200 D-3) 500-200 MG-UNIT per tablet Take 1 tablet by mouth daily.      . Cholecalciferol (VITAMIN D3) 2000 UNITS TABS Take 1 tablet by mouth daily.      . cyanocobalamin 2000 MCG tablet Take 2,500 mcg by mouth daily.    Marland Kitchen DEXILANT 60 MG capsule TAKE 1 CAPSULE BY MOUTH DAILY. 30 capsule 2  . diltiazem (CARDIZEM CD) 180 MG 24 hr capsule TAKE ONE CAPSULE BY MOUTH EVERY DAY 90 capsule 3  . diltiazem (CARDIZEM CD) 180 MG 24 hr capsule TAKE ONE CAPSULE BY MOUTH ONCE DAILY 90 capsule 1  . Ferrous Sulfate (IRON) 325 (65  FE) MG TABS Take 1 tablet by mouth daily.      . hydrochlorothiazide (HYDRODIURIL) 12.5 MG tablet Take 1 daily at noon as needed for excessive foot swelling 30 tablet 3  . losartan-hydrochlorothiazide (HYZAAR) 50-12.5 MG per tablet TAKE ONE TABLET BY MOUTH ONCE DAILY 90 tablet 3  . metoprolol succinate (TOPROL-XL) 50 MG 24 hr tablet TAKE ONE-HALF TABLET BY MOUTH EVERY DAY 90 tablet 3  . Multiple Vitamins-Minerals (PRESERVISION AREDS 2 PO) Take 2 capsules by mouth daily.      Marland Kitchen omega-3 acid ethyl esters (LOVAZA) 1 G capsule Take by mouth 2 (two) times daily.    . pravastatin (PRAVACHOL) 40 MG tablet TAKE 1 TABLET BY MOUTH ONCE DAILY IN THE EVENING. 90 tablet 0  . primidone (MYSOLINE) 50 MG tablet TAKE ONE TABLET BY MOUTH THREE TIMES DAILY 270 tablet 3  . traMADol (ULTRAM) 50 MG tablet Take 1 tablet (50 mg total) by mouth 3 (three) times daily as needed. 90 tablet 5  . polyethylene glycol powder (GLYCOLAX/MIRALAX) powder MIX 17 GRAMS IN LIQUID AND DRINK DAILY 255 g 2  . [DISCONTINUED] diltiazem (DILACOR XR) 180 MG 24 hr capsule Take 1 capsule (180 mg total) by mouth daily. 90 capsule 3   No current facility-administered medications on file prior to visit.    Review of Systems:  As per HPI- otherwise negative.   Physical Examination: Filed Vitals:   01/08/14 1147  BP: 138/64  Pulse: 63  Temp: 97.3 F (36.3 C)  Resp: 16   Filed Vitals:   01/08/14 1147  Height: 5' 0.5" (1.537 m)  Weight: 156 lb (70.761 kg)   Body mass index is 29.95 kg/(m^2). Ideal Body Weight: Weight in (lb) to have BMI = 25: 129.9  GEN: WDWN, NAD, Non-toxic, A & O x 3, well appearing elderly lady here today with her daughter HEENT: Atraumatic, Normocephalic. Neck supple. No masses, No LAD. Ears and Nose: No external deformity. CV: RRR, No M/G/R. No JVD. No thrill. No extra heart sounds. PULM: CTA B, no wheezes, crackles, rhonchi. No retractions. No resp. distress. No accessory muscle use. EXTR: No c/c NEURO  Normal gait for her. Uses a cane PSYCH: Normally interactive. Conversant. Not depressed or anxious appearing.  Calm demeanor.  She has 1+ pitting edema of both LE, right worse than left.  This extends to the mid tibias   Assessment and Plan: Pedal edema - Plan: furosemide (LASIX) 20 MG tablet, Basic metabolic panel, Basic metabolic panel  Chronic venous insufficiency - Plan: furosemide (LASIX) 20 MG tablet, Basic metabolic panel  Gastroesophageal reflux disease without esophagitis - Plan: omeprazole (PRILOSEC) 20 MG capsule  Will DC her 2nd dose of HCTZ although she may continue to take her hyzaar  with 12.5 mg a day.  Will try lasix, adjust dose as needed daily.  Explained that her sx will probably never go away permanently.  However we will try to keep them under control so she is comfortable. She will also purchase and use compression hose daily- encouraged her to use these more and lasix less as much as possible. Lab visit only for BMP in 2-3 weeks.  Cautioned that close follow-up of her electrolytes will be needed at least until we see how she responds  Signed Lamar Blinks, MD

## 2014-01-08 NOTE — Patient Instructions (Signed)
We are going to try adding some furosemide (lasix) to your regimen for swelling.  However you should only use this medication when needed as it can have side effects.   I would prefer to try and control your symptoms more through using compression stockings and foot elevation.    Please come by clinic in about 2 weeks for a recheck BMP to make sure your kidneys are ok with the lasix; you can do this as a lab visit only Lasix can also lower your blood pressure- let me know if you have any trouble with feeling lightheaded

## 2014-02-05 ENCOUNTER — Other Ambulatory Visit: Payer: Self-pay | Admitting: Family Medicine

## 2014-03-03 ENCOUNTER — Ambulatory Visit (INDEPENDENT_AMBULATORY_CARE_PROVIDER_SITE_OTHER): Payer: Medicare Other | Admitting: Family Medicine

## 2014-03-03 ENCOUNTER — Encounter: Payer: Self-pay | Admitting: Family Medicine

## 2014-03-03 ENCOUNTER — Encounter: Payer: Self-pay | Admitting: *Deleted

## 2014-03-03 ENCOUNTER — Telehealth: Payer: Self-pay | Admitting: Family Medicine

## 2014-03-03 VITALS — BP 110/60 | HR 64 | Temp 98.3°F | Ht 59.0 in | Wt 155.0 lb

## 2014-03-03 DIAGNOSIS — M545 Low back pain, unspecified: Secondary | ICD-10-CM

## 2014-03-03 DIAGNOSIS — D509 Iron deficiency anemia, unspecified: Secondary | ICD-10-CM

## 2014-03-03 DIAGNOSIS — R609 Edema, unspecified: Secondary | ICD-10-CM

## 2014-03-03 DIAGNOSIS — E538 Deficiency of other specified B group vitamins: Secondary | ICD-10-CM

## 2014-03-03 DIAGNOSIS — M810 Age-related osteoporosis without current pathological fracture: Secondary | ICD-10-CM

## 2014-03-03 DIAGNOSIS — R1314 Dysphagia, pharyngoesophageal phase: Secondary | ICD-10-CM

## 2014-03-03 DIAGNOSIS — G250 Essential tremor: Secondary | ICD-10-CM

## 2014-03-03 DIAGNOSIS — I872 Venous insufficiency (chronic) (peripheral): Secondary | ICD-10-CM

## 2014-03-03 LAB — CBC WITH DIFFERENTIAL/PLATELET
BASOS ABS: 0 10*3/uL (ref 0.0–0.1)
BASOS PCT: 0.5 % (ref 0.0–3.0)
Eosinophils Absolute: 0.1 10*3/uL (ref 0.0–0.7)
Eosinophils Relative: 1.5 % (ref 0.0–5.0)
HCT: 38.5 % (ref 36.0–46.0)
HEMOGLOBIN: 12.9 g/dL (ref 12.0–15.0)
LYMPHS ABS: 2.1 10*3/uL (ref 0.7–4.0)
Lymphocytes Relative: 27.6 % (ref 12.0–46.0)
MCHC: 33.5 g/dL (ref 30.0–36.0)
MCV: 101.3 fl — ABNORMAL HIGH (ref 78.0–100.0)
Monocytes Absolute: 0.6 10*3/uL (ref 0.1–1.0)
Monocytes Relative: 8.2 % (ref 3.0–12.0)
Neutro Abs: 4.8 10*3/uL (ref 1.4–7.7)
Neutrophils Relative %: 62.2 % (ref 43.0–77.0)
PLATELETS: 290 10*3/uL (ref 150.0–400.0)
RBC: 3.8 Mil/uL — ABNORMAL LOW (ref 3.87–5.11)
RDW: 14.5 % (ref 11.5–15.5)
WBC: 7.6 10*3/uL (ref 4.0–10.5)

## 2014-03-03 LAB — COMPREHENSIVE METABOLIC PANEL
ALBUMIN: 4.3 g/dL (ref 3.5–5.2)
ALT: 14 U/L (ref 0–35)
AST: 19 U/L (ref 0–37)
Alkaline Phosphatase: 76 U/L (ref 39–117)
BUN: 16 mg/dL (ref 6–23)
CALCIUM: 10.2 mg/dL (ref 8.4–10.5)
CHLORIDE: 101 meq/L (ref 96–112)
CO2: 30 mEq/L (ref 19–32)
Creatinine, Ser: 0.74 mg/dL (ref 0.40–1.20)
GFR: 78.68 mL/min (ref >60.00)
GLUCOSE: 87 mg/dL (ref 70–99)
POTASSIUM: 4.1 meq/L (ref 3.5–5.1)
SODIUM: 138 meq/L (ref 135–145)
Total Bilirubin: 0.4 mg/dL (ref 0.2–1.2)
Total Protein: 7.3 g/dL (ref 6.0–8.3)

## 2014-03-03 LAB — VITAMIN B12: Vitamin B-12: >1500 pg/mL — ABNORMAL HIGH (ref 211–911)

## 2014-03-03 MED ORDER — TRAMADOL HCL 50 MG PO TABS: 50.0000 mg | ORAL_TABLET | Freq: Three times a day (TID) | ORAL | Status: DC | PRN

## 2014-03-03 NOTE — Assessment & Plan Note (Signed)
Biggest concern now. Frequent post prandial emesis/reflux.  Refer back to GI for further eval. Dr. Sharlett Iles has retired.

## 2014-03-03 NOTE — Telephone Encounter (Signed)
Please order GI referral. Scheduled with Dr. Henrene Pastor at [facility] 03/31/14  Thanks

## 2014-03-03 NOTE — Telephone Encounter (Signed)
Please put referral in

## 2014-03-03 NOTE — Assessment & Plan Note (Signed)
Due for re-eval. If we can stop iron we should given constipation.

## 2014-03-03 NOTE — Assessment & Plan Note (Signed)
Compression hose and elevation, lasix prn.

## 2014-03-03 NOTE — Assessment & Plan Note (Signed)
Moderate control when on tramadol. ESI with Dr. Ernestina Patches. Refill tramadol.

## 2014-03-03 NOTE — Assessment & Plan Note (Signed)
Due for re-eval  Now on oral 3000 mg daily OTC supplement.

## 2014-03-03 NOTE — Assessment & Plan Note (Signed)
On primidone for tremor this does not help. Can consider change or increase if appropriate.

## 2014-03-03 NOTE — Progress Notes (Signed)
Pre visit review using our clinic review tool, if applicable. No additional management support is needed unless otherwise documented below in the visit note. 

## 2014-03-03 NOTE — Progress Notes (Signed)
Subjective:    Patient ID: [name], female    DOB: May 21, 1923, 81 y.o.   MRN: [id number]  HPI   81 year old female presents to establish. Her previous MD was Dr. Robina Ade has recently seen Dr. Lamar Blinks At [facility] on 12/2014 for pedal edema.  Per last OV:  She was dx with chronic venoius insufficiency. HCTZ D/C'd Started on lasix 20 mg prn.  BMP few weeks later was normal.  Last appt with  Dr. Lenna Gilford was on 05/11/2013  HBP> On MetoprololER25, Diltiazem180 & Hyzaar50-12.5; BP controlled on meds, continue same!   Chol> On Prav40 now; FLP is much improved & now at goals on the Prav40- continue same...   GI> Hx HH s/p Nissen & constip on Dexilant 60mg /d, Miralax, Senakot-S; Doing well w/o heartburn, abd pain, n/v, change in BMs etc...   Osteop> On Alendronate, calcium, MVI, Vit D; 2000 u daily; She uses Tramadol for pain; Last BMD 07/29/2012 reviewed w/ TScore -2.6 in right FemNeck...   Tremor> w/ head titubations on Primadone 50mg  Tid & stable...    Anxiety> She manages well & doesn't require meds...   Anemia> on Fe daily; Hg 3/15 was down sl to 12.3 & rec to continue Fe supplement...  She is due for her medicare wellness.. Uses walker or cane.  She has recently been having issues with dysphagia, GRD pain if she eats things she shouldn't. Lately has been vomiting after a meal if food does not go down well. She has noted emesis being very yellow, one episode of black emesis.  She is also somewhat constipated.  She is now on prilosec  She states her biggest issue is osteoarthtritis head to toe. Low back pain... ESI with Dr. Ernestina Patches. Knee arthritis: followed by Dr. Estanislado Pandy . Getting knee joint supplement injections.Uses  tramadol for pain  Three times a day. Needs refill Tyelnol 2 tab BID helps minimally.    Review of Systems  Constitutional: Negative for diaphoresis and fatigue.  HENT: Negative for ear pain.   Eyes: Negative  for pain.  Respiratory: Negative for shortness of breath.   Cardiovascular: Negative for chest pain.  Gastrointestinal: Positive for nausea, vomiting, abdominal pain and constipation.       Objective:   Physical Exam  Constitutional: Vital signs are normal. She appears well-developed and well-nourished. She is cooperative.  Non-toxic appearance. She does not appear ill. No distress.  Elderly female in NAD  hearing aides  HENT:  Head: Normocephalic.  Right Ear: Hearing, tympanic membrane, external ear and ear canal normal. Tympanic membrane is not erythematous, not retracted and not bulging.  Left Ear: Hearing, tympanic membrane, external ear and ear canal normal. Tympanic membrane is not erythematous, not retracted and not bulging.  Nose: Nose normal. No mucosal edema or rhinorrhea. Right sinus exhibits no maxillary sinus tenderness and no frontal sinus tenderness. Left sinus exhibits no maxillary sinus tenderness and no frontal sinus tenderness.  Mouth/Throat: Uvula is midline, oropharynx is clear and moist and mucous membranes are normal.  Eyes: Conjunctivae, EOM and lids are normal. Pupils are equal, round, and reactive to light. Lids are everted and swept, no foreign bodies found.  Neck: Trachea normal and normal range of motion. Neck supple. Carotid bruit is not present. No thyroid mass and no thyromegaly present.  Cardiovascular: Normal rate, regular rhythm, S1 normal, S2 normal, normal heart sounds, intact distal pulses and normal pulses.  Exam reveals no gallop and no friction rub.   No  murmur heard. Pulmonary/Chest: Effort normal and breath sounds normal. No tachypnea. No respiratory distress. She has no decreased breath sounds. She has no wheezes. She has no rhonchi. She has no rales.  Abdominal: Soft. Normal appearance and bowel sounds are normal. She exhibits no distension, no fluid wave, no abdominal bruit and no mass. There is no hepatosplenomegaly. There is no tenderness. There  is no rebound, no guarding and no CVA tenderness. No hernia.  Lymphadenopathy:    She has no cervical adenopathy.    She has no axillary adenopathy.  Neurological: She is alert. She has normal strength. No cranial nerve deficit or sensory deficit.  Skin: Skin is warm, dry and intact. No rash noted.  small sore in right nostril.. Told to apply antibiotic ointment.  Psychiatric: Her speech is normal and behavior is normal. Judgment and thought content normal. Her mood appears not anxious. Cognition and memory are normal. She does not exhibit a depressed mood.          Assessment & Plan:

## 2014-03-03 NOTE — Assessment & Plan Note (Signed)
Fron venous insufficiency.

## 2014-03-03 NOTE — Assessment & Plan Note (Signed)
She has been on alendronate. Thinks she has been on many years. Last DEXA 2014 T -2.6 Will plan to stop alendronate. Plan recheck in 1 year.

## 2014-03-03 NOTE — Patient Instructions (Addendum)
Wear compression hose and elevate legs above heart when able. Walk as able. Use lasix every other day as needed. Use tramadol for pain. Stop fosamax and plan bone density next year. Stop at front desk to set up GI referral now that Dr. Sharlett Iles has retired. Stop at lab on way out.

## 2014-03-05 ENCOUNTER — Other Ambulatory Visit: Payer: Self-pay | Admitting: Pulmonary Disease

## 2014-03-07 ENCOUNTER — Other Ambulatory Visit: Payer: Self-pay | Admitting: *Deleted

## 2014-03-07 MED ORDER — PRAVASTATIN SODIUM 40 MG PO TABS: ORAL_TABLET | ORAL | Status: DC

## 2014-03-31 ENCOUNTER — Encounter: Payer: Self-pay | Admitting: Internal Medicine

## 2014-03-31 ENCOUNTER — Ambulatory Visit (INDEPENDENT_AMBULATORY_CARE_PROVIDER_SITE_OTHER): Payer: Medicare Other | Admitting: Internal Medicine

## 2014-03-31 VITALS — BP 132/76 | HR 64 | Ht 60.0 in | Wt 155.0 lb

## 2014-03-31 DIAGNOSIS — K5909 Other constipation: Secondary | ICD-10-CM

## 2014-03-31 DIAGNOSIS — K219 Gastro-esophageal reflux disease without esophagitis: Secondary | ICD-10-CM

## 2014-03-31 NOTE — Patient Instructions (Signed)
Please follow up with Dr. Perry as needed 

## 2014-03-31 NOTE — Progress Notes (Signed)
HISTORY OF PRESENT ILLNESS:  [name] is a 81 y.o. female with multiple medical problems as listed below. She returns today for ongoing management of GERD as well as worsening problems with constipation. She is accompanied by her daughter. The patient had been previously cared for by Dr. Verl Blalock until his recent retirement. She was last seen in the office in 2014. She is status post fundoplication repair of large hiatal hernia. She has some anatomic deformity which results in minor dysphagia. Patient reports that her dysphagia has not changed and she is managing fine without weight loss (actually has had weight gain). In any event, she continues on PPI without GERD symptoms. The new his complaint is greater than one year history of constipation. She strains most days. Occasionally constipation gets severe results in an episode of vomiting, generally once. After defecation she feels fine. This problem occurs about once every other month. Last episode in December. She has used "a powder" sporadically with mixed results. There has been no abdominal pain or bleeding. No significant change in medications. She has seen a rheumatologist who has helped her with her arthritis. Her last upper endoscopy was October 2012 revealing postoperative change in anatomic distortion as described. Her last colonoscopy in 2008 was incomplete and described as "technically impossible despite pediatric scope". Exam was carried out to the hepatic flexure. Review of recent laboratories from her primary care provider last month show normal CBC, comprehensive metabolic panel, and O75 level.  REVIEW OF SYSTEMS:  All non-GI ROS negative except for arthritis, back pain, decreased hearing  Past Medical History  Diagnosis Date  . Hypertension   . Cardiac dysrhythmia, unspecified   . Venous insufficiency   . Pure hypercholesterolemia   . Hiatal hernia   . Chronic constipation   . Urinary tract infection, site not  specified   . Generalized osteoarthrosis, unspecified site   . Osteoporosis   . Abnormal involuntary movements(781.0)   . Anxiety state, unspecified   . Anemia   . DJD (degenerative joint disease)   . Tortuous colon   . Pelvic adhesions   . Esophageal stricture   . Postgastrectomy syndrome   . GERD (gastroesophageal reflux disease)   . Blood transfusion   . Vitamin B12 deficiency     Past Surgical History  Procedure Laterality Date  . Nissen fundoplication    . Cholecystectomy    . Colonoscopy    . Upper gastrointestinal endoscopy    . Total abdominal hysterectomy      DUB,     Social History [name]  reports that she has never smoked. She has never used smokeless tobacco. She reports that she does not drink alcohol or use illicit drugs.  family history includes Arthritis in her father; Cancer in her mother; Heart disease in her brother and sister.  No Known Allergies     PHYSICAL EXAMINATION: Vital signs: BP 132/76 mmHg  Pulse 64  Ht 5' (1.524 m)  Wt 155 lb (70.308 kg)  BMI 30.27 kg/m2 General: Well-developed, well-nourished, no acute distress HEENT: Sclerae are anicteric, conjunctiva pink. Oral mucosa intact Lungs: Clear Heart: Regular Abdomen: soft, nontender, obese, nondistended, no obvious ascites, no peritoneal signs, normal bowel sounds. No organomegaly. Extremities: No edema. Arthritic deformities of the hands Psychiatric: alert and oriented x3. Cooperative     ASSESSMENT:  #1. Functional constipation with occasional vomiting #2. GERD status post fundoplication for large hiatal hernia   PLAN:  #1. Recommend MiraLAX every day. However, titrate to  results. Would like to achieve one or 2 bowel movements daily without incontinence #2. Continue PPI for GERD #3. Resume general medical care with PCP. GI follow-up as needed

## 2014-04-28 ENCOUNTER — Telehealth: Payer: Self-pay | Admitting: Family Medicine

## 2014-04-28 ENCOUNTER — Other Ambulatory Visit (INDEPENDENT_AMBULATORY_CARE_PROVIDER_SITE_OTHER): Payer: Medicare Other

## 2014-04-28 DIAGNOSIS — M810 Age-related osteoporosis without current pathological fracture: Secondary | ICD-10-CM

## 2014-04-28 DIAGNOSIS — E538 Deficiency of other specified B group vitamins: Secondary | ICD-10-CM

## 2014-04-28 DIAGNOSIS — E7800 Pure hypercholesterolemia, unspecified: Secondary | ICD-10-CM

## 2014-04-28 DIAGNOSIS — R600 Localized edema: Secondary | ICD-10-CM

## 2014-04-28 DIAGNOSIS — D509 Iron deficiency anemia, unspecified: Secondary | ICD-10-CM

## 2014-04-28 LAB — COMPREHENSIVE METABOLIC PANEL
ALK PHOS: 83 U/L (ref 39–117)
ALT: 9 U/L (ref 0–35)
AST: 14 U/L (ref 0–37)
Albumin: 4 g/dL (ref 3.5–5.2)
BILIRUBIN TOTAL: 0.4 mg/dL (ref 0.2–1.2)
BUN: 13 mg/dL (ref 6–23)
CHLORIDE: 103 meq/L (ref 96–112)
CO2: 32 meq/L (ref 19–32)
CREATININE: 0.78 mg/dL (ref 0.40–1.20)
Calcium: 9.4 mg/dL (ref 8.4–10.5)
GFR: 74.01 mL/min (ref >60.00)
GLUCOSE: 84 mg/dL (ref 70–99)
Potassium: 3.6 mEq/L (ref 3.5–5.1)
Sodium: 140 mEq/L (ref 135–145)
Total Protein: 6.8 g/dL (ref 6.0–8.3)

## 2014-04-28 LAB — LIPID PANEL
CHOLESTEROL: 142 mg/dL (ref 0–200)
HDL: 49.5 mg/dL (ref >39.00)
LDL Cholesterol: 70 mg/dL (ref 0–99)
NonHDL: 92.5
TRIGLYCERIDES: 114 mg/dL (ref 0.0–149.0)
Total CHOL/HDL Ratio: 3
VLDL: 22.8 mg/dL (ref 0.0–40.0)

## 2014-04-28 LAB — VITAMIN D 25 HYDROXY (VIT D DEFICIENCY, FRACTURES): VITD: 27.26 ng/mL — ABNORMAL LOW (ref 30.00–100.00)

## 2014-04-28 NOTE — Addendum Note (Signed)
Addended by: Ellamae Sia on: 04/28/2014 02:37 PM   Modules accepted: Orders

## 2014-04-28 NOTE — Telephone Encounter (Signed)
-----   Message from Ellamae Sia sent at 04/28/2014  9:26 AM EST ----- Regarding: lab orders asap Patient is scheduled for CPX labs, please order future labs, Thanks , Karna Christmas

## 2014-04-28 NOTE — Addendum Note (Signed)
Addended by: Ellamae Sia on: 04/28/2014 09:25 AM   Modules accepted: Orders

## 2014-05-03 ENCOUNTER — Ambulatory Visit (INDEPENDENT_AMBULATORY_CARE_PROVIDER_SITE_OTHER): Payer: Medicare Other | Admitting: Family Medicine

## 2014-05-03 ENCOUNTER — Encounter: Payer: Self-pay | Admitting: Family Medicine

## 2014-05-03 VITALS — BP 110/60 | HR 62 | Temp 98.3°F | Ht 60.0 in | Wt 156.5 lb

## 2014-05-03 DIAGNOSIS — Z0000 Encounter for general adult medical examination without abnormal findings: Secondary | ICD-10-CM

## 2014-05-03 DIAGNOSIS — F03B0 Unspecified dementia, moderate, without behavioral disturbance, psychotic disturbance, mood disturbance, and anxiety: Secondary | ICD-10-CM

## 2014-05-03 DIAGNOSIS — E559 Vitamin D deficiency, unspecified: Secondary | ICD-10-CM

## 2014-05-03 DIAGNOSIS — D509 Iron deficiency anemia, unspecified: Secondary | ICD-10-CM | POA: Diagnosis not present

## 2014-05-03 DIAGNOSIS — Z23 Encounter for immunization: Secondary | ICD-10-CM | POA: Diagnosis not present

## 2014-05-03 DIAGNOSIS — E7800 Pure hypercholesterolemia, unspecified: Secondary | ICD-10-CM

## 2014-05-03 DIAGNOSIS — Z7189 Other specified counseling: Secondary | ICD-10-CM

## 2014-05-03 DIAGNOSIS — F0390 Unspecified dementia without behavioral disturbance: Secondary | ICD-10-CM | POA: Insufficient documentation

## 2014-05-03 DIAGNOSIS — K5909 Other constipation: Secondary | ICD-10-CM | POA: Diagnosis not present

## 2014-05-03 NOTE — Progress Notes (Signed)
Subjective:    Patient ID: [name], female    DOB: [birth year], 81 y.o.   MRN: [id number]  HPI  81 year old female pt presents for medicare wellnes.  I have personally reviewed the Medicare Annual Wellness questionnaire and have noted 1. The patient's medical and social history 2. Their use of alcohol, tobacco or illicit drugs 3. Their current medications and supplements 4. The patient's functional ability including ADL's, fall risks, home safety risks and hearing or visual             impairment. 5. Diet and physical activities 6. Evidence for depression or mood disorders` The patients weight, height, BMI and visual acuity have been recorded in the chart I have made referrals, counseling and provided education to the patient based review of the above and I have provided the pt with a written personalized care plan for preventive services.  Mid back pain: Hx of DDD, arthritis Followed by Dr. Ernestina Patches, upcoming ESI  On 05/12/2014. On tramadol for pain.  She comes to clinic today with her daughter.  Diet: healthy except limited with dysphagia history.  GERD hx of fundoplication for large hiatal hernia.On omeprazole daily. Miralax for constipation but does not take regularly. She has BM every 2-3 days. Saw Dr. Henrene Pastor in 03/2014.  Eats a lot of processed food.  Exercise: Limited. Uses walker or cane. 3 falls in last year.  Reviewed labs in detail.  Elevated Cholesterol: Well controlled on pravastatin. Goal< 130 Using medications without problems:No  She would like to continue taking this medication.  Vit D : on vit D3 400 units daily.    Review of Systems  All other systems reviewed and are negative.      Objective:   Physical Exam  Constitutional: Vital signs are normal. She appears well-developed and well-nourished. She is cooperative.  Non-toxic appearance. She does not appear ill. No distress.  Elderly female in NAD  hearing aides  HENT:  Head: Normocephalic.    Right Ear: Hearing, tympanic membrane, external ear and ear canal normal. Tympanic membrane is not erythematous, not retracted and not bulging.  Left Ear: Hearing, tympanic membrane, external ear and ear canal normal. Tympanic membrane is not erythematous, not retracted and not bulging.  Nose: Nose normal. No mucosal edema or rhinorrhea. Right sinus exhibits no maxillary sinus tenderness and no frontal sinus tenderness. Left sinus exhibits no maxillary sinus tenderness and no frontal sinus tenderness.  Mouth/Throat: Uvula is midline, oropharynx is clear and moist and mucous membranes are normal.  Eyes: Conjunctivae, EOM and lids are normal. Pupils are equal, round, and reactive to light. Lids are everted and swept, no foreign bodies found.  Neck: Trachea normal and normal range of motion. Neck supple. Carotid bruit is not present. No thyroid mass and no thyromegaly present.  Cardiovascular: Normal rate, regular rhythm, S1 normal, S2 normal, normal heart sounds, intact distal pulses and normal pulses.  Exam reveals no gallop and no friction rub.   No murmur heard. Pulmonary/Chest: Effort normal and breath sounds normal. No tachypnea. No respiratory distress. She has no decreased breath sounds. She has no wheezes. She has no rhonchi. She has no rales.  Abdominal: Soft. Normal appearance and bowel sounds are normal. She exhibits no distension, no fluid wave, no abdominal bruit and no mass. There is no hepatosplenomegaly. There is no tenderness. There is no rebound, no guarding and no CVA tenderness. No hernia.  Lymphadenopathy:    She has no cervical adenopathy.  She has no axillary adenopathy.  Neurological: She is alert. She has normal strength. No cranial nerve deficit or sensory deficit.  Skin: Skin is warm, dry and intact. No rash noted.  Psychiatric: Her speech is normal and behavior is normal. Judgment and thought content normal. Her mood appears not anxious. Cognition and memory are normal.  She does not exhibit a depressed mood.          Assessment & Plan:  The patient's preventative maintenance and recommended screening tests for an annual wellness exam were reviewed in full today. Brought up to date unless services declined.  Counselled on the importance of diet, exercise, and its role in overall health and mortality. The patient's FH and SH was reviewed, including their home life, tobacco status, and drug and alcohol status.   She has been on alendronate. Thinks she has been on many years. Last DEXA 2014 T -2.6 Will plan to stop alendronate. Plan recheck in 1 year (2017) Vaccines:uptodate with flu, due for prevnar, tdap, shingles, Refused Tdap, shingles.  Colon: not indicated  Mammo: not indicated. PAP/DVE: not indicated. nonsmoker

## 2014-05-03 NOTE — Progress Notes (Signed)
Pre visit review using our clinic review tool, if applicable. No additional management support is needed unless otherwise documented below in the visit note. 

## 2014-05-03 NOTE — Patient Instructions (Addendum)
When able work on walking as tolerated. Can use miralax daily. Increase vit D3 increase to 400 units twice daily.  Will check cbc off iron at next OV. Call insurance for shingles vaccine coverage. Discuss with family possible DNR status.   Follow up in in 3 months  memory eval ( 30 min eval, lbs prior to check CBC)

## 2014-05-16 ENCOUNTER — Other Ambulatory Visit: Payer: Self-pay | Admitting: *Deleted

## 2014-05-16 NOTE — Telephone Encounter (Signed)
Last office visit 05/03/2014.  Ok to refill?

## 2014-05-17 DIAGNOSIS — E559 Vitamin D deficiency, unspecified: Secondary | ICD-10-CM | POA: Insufficient documentation

## 2014-05-17 MED ORDER — TRAMADOL HCL 50 MG PO TABS: 50.0000 mg | ORAL_TABLET | Freq: Two times a day (BID) | ORAL | Status: DC

## 2014-05-17 NOTE — Assessment & Plan Note (Signed)
Can use miralax prn.

## 2014-05-17 NOTE — Assessment & Plan Note (Signed)
Re-eval cbc off iron.

## 2014-05-17 NOTE — Telephone Encounter (Signed)
Called to [facility].

## 2014-05-17 NOTE — Assessment & Plan Note (Signed)
Well controlled. Continue current medication.  

## 2014-05-17 NOTE — Assessment & Plan Note (Signed)
Increase vit D3 increase to 400 units twice daily.

## 2014-05-27 ENCOUNTER — Other Ambulatory Visit: Payer: Self-pay | Admitting: Family Medicine

## 2014-05-27 NOTE — Telephone Encounter (Signed)
Last office visit 05/03/2014.  Last refilled 05/17/2014 for #30 with no refills.  Ok to refill?

## 2014-05-30 NOTE — Telephone Encounter (Signed)
Tramadol called to [facility].

## 2014-06-03 ENCOUNTER — Other Ambulatory Visit: Payer: Self-pay | Admitting: Family Medicine

## 2014-06-17 ENCOUNTER — Other Ambulatory Visit: Payer: Self-pay | Admitting: Family Medicine

## 2014-06-17 NOTE — Telephone Encounter (Signed)
Called to [facility].

## 2014-06-17 NOTE — Telephone Encounter (Signed)
Last office visit 05/03/2014.  Last refilled 05/30/2014 for #30 with no refills.  Ok to refill?

## 2014-07-02 ENCOUNTER — Other Ambulatory Visit: Payer: Self-pay | Admitting: Family Medicine

## 2014-07-04 NOTE — Telephone Encounter (Signed)
Recent electrolytes look ok.  Will refill and forward FYI to Dr. Diona Browner  BP Readings from Last 3 Encounters:  05/03/14 110/60  03/31/14 132/76  03/03/14 110/60

## 2014-07-04 NOTE — Telephone Encounter (Signed)
Dr. Lorelei Pont, did you want to refill this? It looks like she has a PCP.

## 2014-07-07 ENCOUNTER — Telehealth: Payer: Self-pay

## 2014-07-07 MED ORDER — AMBULATORY NON FORMULARY MEDICATION: Status: DC

## 2014-07-07 NOTE — Telephone Encounter (Signed)
Refilled GI cocktail per Dr. Henrene Pastor

## 2014-07-07 NOTE — Telephone Encounter (Signed)
Received a refill request for GI cocktail.  Patient is a previous [name] patient last seen in the office 03/31/2014.  She has not had a GI cocktail prescribed since 2013.  I spoke with patient's daughter who said patient just keeps it in the refrigerator and just uses it as needed and infrequently.  It is finally running low and patient had a "flare" with her reflux last week that occasionally happens in spite of her PPI.  Is it ok to refill this?

## 2014-07-07 NOTE — Telephone Encounter (Signed)
Yes. Okay to refill. Thank you

## 2014-07-08 ENCOUNTER — Other Ambulatory Visit: Payer: Self-pay | Admitting: Family Medicine

## 2014-07-09 NOTE — Telephone Encounter (Signed)
Last office visit 05/03/2014.  Last refilled 06/17/2014 for #30 with no refills.  Ok to refill?

## 2014-07-11 NOTE — Telephone Encounter (Signed)
Called in to [facility].

## 2014-07-29 ENCOUNTER — Other Ambulatory Visit: Payer: Self-pay | Admitting: Family Medicine

## 2014-08-05 ENCOUNTER — Ambulatory Visit (INDEPENDENT_AMBULATORY_CARE_PROVIDER_SITE_OTHER): Payer: Medicare Other | Admitting: Family Medicine

## 2014-08-05 ENCOUNTER — Encounter: Payer: Self-pay | Admitting: Family Medicine

## 2014-08-05 VITALS — BP 124/66 | HR 69 | Temp 97.9°F | Wt 150.0 lb

## 2014-08-05 DIAGNOSIS — I10 Essential (primary) hypertension: Secondary | ICD-10-CM

## 2014-08-05 DIAGNOSIS — G250 Essential tremor: Secondary | ICD-10-CM

## 2014-08-05 DIAGNOSIS — I872 Venous insufficiency (chronic) (peripheral): Secondary | ICD-10-CM

## 2014-08-05 DIAGNOSIS — R609 Edema, unspecified: Secondary | ICD-10-CM | POA: Diagnosis not present

## 2014-08-05 NOTE — Assessment & Plan Note (Signed)
Worsened control. Will try higher dose of primidone to see if improvement with less SE.  Already on BBlocker.  If not improving consider referral to Dr Tat Neuro movement Specialist.

## 2014-08-05 NOTE — Assessment & Plan Note (Signed)
Multiofactorial but most likely due to venous insufficiency.  Increase lasix to 40 mg daily temporarily. If not improving consider further eval with labs

## 2014-08-05 NOTE — Patient Instructions (Addendum)
Increase primidone to 2 tabs at dinner. If improving after 1 week can try increasing AM dose as well.  Call if increase in sedation or other side effects.  Increase lasix to 2 tabs daily for 3 days.   Elevate feet above heart.  Look into zipper compression hose. Call if swelling not improving as expected.

## 2014-08-05 NOTE — Progress Notes (Signed)
Pre visit review using our clinic review tool, if applicable. No additional management support is needed unless otherwise documented below in the visit note. 

## 2014-08-05 NOTE — Assessment & Plan Note (Signed)
Encouraged elevation of feet, compression hose and activity.

## 2014-08-05 NOTE — Assessment & Plan Note (Signed)
Bp at goal on current medication.

## 2014-08-05 NOTE — Progress Notes (Signed)
Subjective:    Patient ID: [name], female    DOB: 1923-06-20, 81 y.o.   MRN: [id number]  HPI   81 year old female with history of moderate dementia presents with her daughter for 3 month follow up.  Hypertension:    Well controlled on losartan HCTZ, diltiazem, metoprolol and lasix prn.  She has had worse swelling in ankle and feet in last 2 months. No pain.  She has been using 1 tab daily in last month.  She does not like wearing compression hose.   BP Readings from Last 3 Encounters:  08/05/14 124/66  05/03/14 110/60  03/31/14 132/76  Using medication without problems or lightheadedness: None Chest pain with exertion None: Short of breath: When doing house work, but not new. Average home BPs: Not checking. Other issues:  Wt Readings from Last 3 Encounters:  08/05/14 150 lb (68.04 kg)  05/03/14 156 lb 8 oz (70.988 kg)  03/31/14 155 lb (70.308 kg)   She is bothered a lot by benign essential tremor. Diagnosed and work ed up many years ago. Saw a neurologist at that time. Not sure who.  Worse in last few  weeks.  Primidone three times a day helped initially , but no longer helping.  GI cocktail has helped with recent worsening of dysphagia. Followed by Dr. Henrene Pastor.    Review of Systems  Constitutional: Negative for fever and fatigue.  HENT: Negative for ear pain.   Eyes: Negative for pain.  Respiratory: Negative for chest tightness and shortness of breath.   Cardiovascular: Positive for leg swelling. Negative for chest pain and palpitations.  Gastrointestinal: Negative for abdominal pain.  Genitourinary: Negative for dysuria.       Objective:   Physical Exam  Constitutional: Vital signs are normal. She appears well-developed and well-nourished. She is cooperative.  Non-toxic appearance. She does not appear ill. No distress.  Elderly female in NAD  hearing aides  HENT:  Head: Normocephalic.  Right Ear: Hearing, tympanic membrane, external ear and ear canal  normal. Tympanic membrane is not erythematous, not retracted and not bulging.  Left Ear: Hearing, tympanic membrane, external ear and ear canal normal. Tympanic membrane is not erythematous, not retracted and not bulging.  Nose: Nose normal. No mucosal edema or rhinorrhea. Right sinus exhibits no maxillary sinus tenderness and no frontal sinus tenderness. Left sinus exhibits no maxillary sinus tenderness and no frontal sinus tenderness.  Mouth/Throat: Uvula is midline, oropharynx is clear and moist and mucous membranes are normal.  Eyes: Conjunctivae, EOM and lids are normal. Pupils are equal, round, and reactive to light. Lids are everted and swept, no foreign bodies found.  Neck: Trachea normal and normal range of motion. Neck supple. Carotid bruit is not present. No thyroid mass and no thyromegaly present.  Cardiovascular: Normal rate, regular rhythm, S1 normal, S2 normal, normal heart sounds, intact distal pulses and normal pulses.  Exam reveals no gallop and no friction rub.   No murmur heard. 2 plus peripheral edema bilaterally, bilateral varicose veins.  Pulmonary/Chest: Effort normal and breath sounds normal. No tachypnea. No respiratory distress. She has no decreased breath sounds. She has no wheezes. She has no rhonchi. She has no rales.  Abdominal: Soft. Normal appearance and bowel sounds are normal. She exhibits no distension, no fluid wave, no abdominal bruit and no mass. There is no hepatosplenomegaly. There is no tenderness. There is no rebound, no guarding and no CVA tenderness. No hernia.  Lymphadenopathy:    She has  no cervical adenopathy.    She has no axillary adenopathy.  Neurological: She is alert. She has normal strength. No cranial nerve deficit or sensory deficit.  Skin: Skin is warm, dry and intact. No rash noted.  Psychiatric: Her speech is normal and behavior is normal. Her mood appears not anxious. She does not exhibit a depressed mood.          Assessment & Plan:

## 2014-08-06 ENCOUNTER — Other Ambulatory Visit: Payer: Self-pay | Admitting: Family Medicine

## 2014-08-08 NOTE — Telephone Encounter (Signed)
Received refill request electronically from pharmacy Last refill 07/11/14 #30, last office visit 08/05/14 Is it okay to refill medication?

## 2014-08-09 ENCOUNTER — Other Ambulatory Visit: Payer: Self-pay | Admitting: Family Medicine

## 2014-08-09 NOTE — Telephone Encounter (Signed)
Rx called in as prescribed 

## 2014-09-02 ENCOUNTER — Other Ambulatory Visit: Payer: Self-pay | Admitting: Family Medicine

## 2014-09-02 ENCOUNTER — Other Ambulatory Visit: Payer: Self-pay | Admitting: Pulmonary Disease

## 2014-09-02 MED ORDER — DILTIAZEM HCL ER COATED BEADS 180 MG PO CP24: ORAL_CAPSULE | ORAL | Status: DC

## 2014-09-02 MED ORDER — LOSARTAN POTASSIUM-HCTZ 50-12.5 MG PO TABS: ORAL_TABLET | ORAL | Status: DC

## 2014-09-02 MED ORDER — METOPROLOL SUCCINATE ER 50 MG PO TB24: ORAL_TABLET | ORAL | Status: DC

## 2014-09-02 NOTE — Telephone Encounter (Signed)
Last office visit 08/05/2014.  Last refilled 08/08/2014 for #30 with no refills.  Ok to refill?

## 2014-09-02 NOTE — Addendum Note (Signed)
Addended by: Carter Kitten on: 09/02/2014 02:44 PM   Modules accepted: Orders

## 2014-09-02 NOTE — Telephone Encounter (Signed)
Tramadol called to [facility].

## 2014-09-06 ENCOUNTER — Other Ambulatory Visit: Payer: Self-pay | Admitting: *Deleted

## 2014-09-06 MED ORDER — OMEPRAZOLE 20 MG PO CPDR: 20.0000 mg | DELAYED_RELEASE_CAPSULE | Freq: Every day | ORAL | Status: DC

## 2014-09-23 ENCOUNTER — Other Ambulatory Visit: Payer: Self-pay | Admitting: Pulmonary Disease

## 2014-09-26 ENCOUNTER — Other Ambulatory Visit: Payer: Self-pay | Admitting: *Deleted

## 2014-09-26 NOTE — Telephone Encounter (Signed)
Pt's las appt was a 3 mo f/u in 07/2014, and she has a 3 mo f/u scheduled with you on 11/08/14. It it ok to refill?

## 2014-09-27 MED ORDER — PRIMIDONE 50 MG PO TABS: ORAL_TABLET | ORAL | Status: DC

## 2014-10-10 ENCOUNTER — Other Ambulatory Visit: Payer: Self-pay | Admitting: *Deleted

## 2014-10-10 MED ORDER — TRAMADOL HCL 50 MG PO TABS: 50.0000 mg | ORAL_TABLET | Freq: Two times a day (BID) | ORAL | Status: DC | PRN

## 2014-10-10 NOTE — Telephone Encounter (Signed)
Called into [facility].

## 2014-10-10 NOTE — Telephone Encounter (Signed)
Last office visit 08/05/2014.  Last refilled 09/02/2014 for #30 with no refills.  Ok to refill?

## 2014-10-31 ENCOUNTER — Other Ambulatory Visit: Payer: Self-pay | Admitting: Family Medicine

## 2014-11-01 ENCOUNTER — Other Ambulatory Visit: Payer: Self-pay | Admitting: *Deleted

## 2014-11-01 MED ORDER — FUROSEMIDE 20 MG PO TABS: ORAL_TABLET | ORAL | Status: DC

## 2014-11-05 ENCOUNTER — Other Ambulatory Visit: Payer: Self-pay | Admitting: Family Medicine

## 2014-11-05 NOTE — Telephone Encounter (Signed)
Last office visit 08/05/2014.  Last refilled 10/10/2014 for #30 with no refills. Ok to refill?

## 2014-11-07 NOTE — Telephone Encounter (Signed)
Tramadol called into [facility].

## 2014-11-08 ENCOUNTER — Ambulatory Visit: Payer: Medicare Other | Admitting: Family Medicine

## 2014-11-10 ENCOUNTER — Ambulatory Visit (INDEPENDENT_AMBULATORY_CARE_PROVIDER_SITE_OTHER): Payer: Medicare Other | Admitting: Family Medicine

## 2014-11-10 ENCOUNTER — Encounter: Payer: Self-pay | Admitting: Family Medicine

## 2014-11-10 VITALS — BP 120/60 | HR 57 | Temp 97.8°F | Wt 145.0 lb

## 2014-11-10 DIAGNOSIS — G250 Essential tremor: Secondary | ICD-10-CM | POA: Diagnosis not present

## 2014-11-10 DIAGNOSIS — I10 Essential (primary) hypertension: Secondary | ICD-10-CM | POA: Diagnosis not present

## 2014-11-10 DIAGNOSIS — E7800 Pure hypercholesterolemia, unspecified: Secondary | ICD-10-CM

## 2014-11-10 DIAGNOSIS — R609 Edema, unspecified: Secondary | ICD-10-CM | POA: Diagnosis not present

## 2014-11-10 NOTE — Patient Instructions (Signed)
Stop at front desk to set up referral to neurologist.

## 2014-11-10 NOTE — Assessment & Plan Note (Signed)
On BBlocker for BP, On primidone.. No improvement with extra dose ( 4 a day).  Will refer to neurology for further recommendations.

## 2014-11-10 NOTE — Progress Notes (Signed)
Pre visit review using our clinic review tool, if applicable. No additional management support is needed unless otherwise documented below in the visit note. 

## 2014-11-10 NOTE — Assessment & Plan Note (Signed)
Well controlled. Continue current medication.  

## 2014-11-10 NOTE — Assessment & Plan Note (Signed)
Resolved on lasix daily.

## 2014-11-10 NOTE — Progress Notes (Signed)
81 year old female with history of moderate dementia presents with her daughter for 3 month follow up.  Hypertension:  Well controlled on losartan HCTZ, diltiazem, metoprolol and lasix prn. lasix daily.  BP Readings from Last 3 Encounters:  11/10/14 120/60  08/05/14 124/66  05/03/14 110/60  Using medication without problems or lightheadedness: None Chest pain with exertion None: Short of breath: When doing house work, but not new. No cough,no fever Average home BPs: Not checking.  Edema improved. Other issues:  Wt Readings from Last 3 Encounters:  11/10/14 145 lb (65.772 kg)  08/05/14 150 lb (68.04 kg)  05/03/14 156 lb 8 oz (70.988 kg)    Benign essential tremor: Diagnosed and work ed up many years ago. Saw a neurologist at that time. Not sure who.  At last OV 3 months ago we increasedprimidone  To 4 tabs a day.No beneiit, so they returned to the previous dose.  Pt feels that tremor is continuing to worsen further. She is bothered by head tremor tremendously.  Daughter has been trying to improve her salt intake to help with swelling.  Review of Systems  Constitutional: Negative for fever and fatigue.  HENT: Negative for ear pain.  Eyes: Negative for pain.  Respiratory: Negative for chest tightness and shortness of breath.  Cardiovascular:  Negative for chest pain and palpitations.  Gastrointestinal: Negative for abdominal pain.  Genitourinary: Negative for dysuria.       Objective:   Physical Exam  Constitutional: Vital signs are normal. She appears well-developed and well-nourished. She is cooperative. Non-toxic appearance. She does not appear ill. No distress.  Elderly female in NAD hearing aides  HENT:  Head: Normocephalic.  Right Ear: Hearing, tympanic membrane, external ear and ear canal normal. Tympanic membrane is not erythematous, not retracted and not bulging.  Left Ear: Hearing, tympanic membrane, external ear and ear canal normal. Tympanic  membrane is not erythematous, not retracted and not bulging.  Nose: Nose normal. No mucosal edema or rhinorrhea. Right sinus exhibits no maxillary sinus tenderness and no frontal sinus tenderness. Left sinus exhibits no maxillary sinus tenderness and no frontal sinus tenderness.  Mouth/Throat: Uvula is midline, oropharynx is clear and moist and mucous membranes are normal.  Eyes: Conjunctivae, EOM and lids are normal. Pupils are equal, round, and reactive to light. Lids are everted and swept, no foreign bodies found.  Neck: Trachea normal and normal range of motion. Neck supple. Carotid bruit is not present. No thyroid mass and no thyromegaly present.  Cardiovascular: Normal rate, regular rhythm, S1 normal, S2 normal, normal heart sounds, intact distal pulses and normal pulses. Exam reveals no gallop and no friction rub.  No murmur heard. <No  peripheral edema bilaterally, bilateral varicose veins.  Pulmonary/Chest: Effort normal and breath sounds normal. No tachypnea. No respiratory distress. She has no decreased breath sounds. She has no wheezes. She has no rhonchi. She has no rales.  Abdominal: Soft. Normal appearance and bowel sounds are normal. She exhibits no distension, no fluid wave, no abdominal bruit and no mass. There is no hepatosplenomegaly. There is no tenderness. There is no rebound, no guarding and no CVA tenderness. No hernia.  Lymphadenopathy:   She has no cervical adenopathy.   She has no axillary adenopathy.  Neurological: She is alert. She has normal strength. No cranial nerve deficit or sensory deficit.  Skin: Skin is warm, dry and intact. No rash noted.  Psychiatric: Her speech is normal and behavior is normal. Her mood appears not anxious. She  does not exhibit a depressed mood.

## 2014-11-21 ENCOUNTER — Ambulatory Visit (INDEPENDENT_AMBULATORY_CARE_PROVIDER_SITE_OTHER): Payer: Medicare Other | Admitting: Neurology

## 2014-11-21 ENCOUNTER — Encounter: Payer: Self-pay | Admitting: Neurology

## 2014-11-21 VITALS — BP 130/72 | HR 65 | Ht 61.0 in | Wt 147.0 lb

## 2014-11-21 DIAGNOSIS — F0390 Unspecified dementia without behavioral disturbance: Secondary | ICD-10-CM

## 2014-11-21 DIAGNOSIS — G5603 Carpal tunnel syndrome, bilateral upper limbs: Secondary | ICD-10-CM

## 2014-11-21 DIAGNOSIS — G250 Essential tremor: Secondary | ICD-10-CM

## 2014-11-21 DIAGNOSIS — F03A0 Unspecified dementia, mild, without behavioral disturbance, psychotic disturbance, mood disturbance, and anxiety: Secondary | ICD-10-CM

## 2014-11-21 NOTE — Progress Notes (Signed)
Subjective:   [name] was seen in consultation in the movement disorder clinic at the request of Eliezer Lofts, MD.  The evaluation is for tremor.  The patient is accompanied by her daughters who supplements the history.  The consultation is for tremor.  The patient also has a history of reported "moderate dementia."  Records available to me were reviewed.  The patient has had tremor for 20 years; tremor started in the head.  Primidone originally relieved it but the tremor has picked up over the years.   The patient is currently on primidone, 150 mg per day.  She has been up to a total of 200 mg per day, but it did not seem to help so she went back down to 150 mg per day.  The patient reports that head tremor is most bothersome.  She denies hand tremor.  No fam hx of tremor  Affected by caffeine:  unknown (drinks 1 can Dr. Malachi Bonds a day) Affected by alcohol:  uknown Affected by stress:  uknown Affected by fatigue:  No. Spills soup if on spoon:  No. Spills glass of liquid if full:  No. Affects ADL's (tying shoes, brushing teeth, etc):  No.  Current/Previously tried tremor medications: primidone; on metoprolol but on low dose for BP  Current medications that may exacerbate tremor:  n/a  Outside reports reviewed: historical medical records.  No Known Allergies  Outpatient Encounter Prescriptions as of 11/21/2014  Medication Sig  . acetaminophen (TYLENOL) 500 MG tablet Take 1,000 mg by mouth as needed.   . AMBULATORY NON FORMULARY MEDICATION Medication Name: GI Cocktail with 20cc Maalox, 10cc Donnatal, 20cc Viscous Lidocaine 2%, take 30cc dose q8hr as needed for GI upset.  . butalbital-acetaminophen-caffeine (FIORICET, ESGIC) 50-325-40 MG per tablet Take 1 tablet by mouth every 6 (six) hours as needed for headache.  . Calcium-Vitamin D (CALTRATE 600 PLUS-VIT D PO) Take 1 tablet by mouth daily.  . Cyanocobalamin 3000 MCG CAPS Take 1 capsule by mouth daily.  Marland Kitchen diltiazem (CARDIZEM CD) 180  MG 24 hr capsule TAKE ONE CAPSULE BY MOUTH EVERY DAY  . Ferrous Sulfate (IRON) 325 (65 FE) MG TABS Take by mouth.  . furosemide (LASIX) 20 MG tablet TAKE 1/2 OR 1 TABLET DAILY AS NEEDED FOR SWELLING. CAN USE UP TO TWO WHOLE TABLETS DAILY  . losartan-hydrochlorothiazide (HYZAAR) 50-12.5 MG per tablet TAKE ONE TABLET BY MOUTH ONCE DAILY  . Melatonin 5 MG TABS Take 1 tablet by mouth at bedtime.  . metoprolol succinate (TOPROL-XL) 50 MG 24 hr tablet TAKE ONE-HALF TABLET BY MOUTH EVERY DAY  . Misc Natural Products (TART CHERRY ADVANCED) CAPS Take 1 capsule by mouth daily.  Marland Kitchen MISC NATURAL PRODUCTS PO Take 1 tablet by mouth daily. Ginger & Turmeric  . Multiple Vitamins-Minerals (PRESERVISION AREDS 2 PO) Take 2 capsules by mouth daily.    Marland Kitchen omega-3 acid ethyl esters (LOVAZA) 1 G capsule Take by mouth 2 (two) times daily.  Marland Kitchen omeprazole (PRILOSEC) 20 MG capsule Take 1 capsule (20 mg total) by mouth daily.  . pravastatin (PRAVACHOL) 40 MG tablet TAKE 1 TABLET BY MOUTH ONCE DAILY IN THE EVENING.  . primidone (MYSOLINE) 50 MG tablet TAKE ONE TABLET BY MOUTH THREE TIMES DAILY (Patient taking differently: 1 tablet in the morning, 1 in the afternoon, 2 in the evening)  . traMADol (ULTRAM) 50 MG tablet TAKE 1 TABLET BY MOUTH TWICE DAILY AS NEEDED   No facility-administered encounter medications on file as of 11/21/2014.  Past Medical History  Diagnosis Date  . Hypertension   . Cardiac dysrhythmia, unspecified   . Venous insufficiency   . Pure hypercholesterolemia   . Hiatal hernia   . Chronic constipation   . Urinary tract infection, site not specified   . Generalized osteoarthrosis, unspecified site   . Osteoporosis   . Abnormal involuntary movements(781.0)   . Anxiety state, unspecified   . Anemia   . DJD (degenerative joint disease)   . Tortuous colon   . Pelvic adhesions   . Esophageal stricture   . Postgastrectomy syndrome   . GERD (gastroesophageal reflux disease)   . Blood transfusion     . Vitamin B12 deficiency     Past Surgical History  Procedure Laterality Date  . Nissen fundoplication    . Cholecystectomy    . Colonoscopy    . Upper gastrointestinal endoscopy    . Total abdominal hysterectomy      DUB,     Social History   Social History  . Marital Status: Widowed    Spouse Name: N/A  . Number of Children: 3  . Years of Education: N/A   Occupational History  . Retired    Social History Main Topics  . Smoking status: Never Smoker   . Smokeless tobacco: Never Used  . Alcohol Use: No  . Drug Use: No  . Sexual Activity: No   Other Topics Concern  . Not on file   Social History Narrative   Widowed,  4 children, lives close by.    Daughters [place], [place]) come to OV with her.          Family Status  Relation Status Death Age  . Mother Deceased 81    CA of unknown type  . Brother Deceased     heart disease  . Father Deceased 64    unknown cause of death  . Sister Alive   . Brother Deceased   . Sister Alive     Review of Systems A complete 10 system ROS was obtained and was negative apart from what is mentioned.   Objective:   VITALS:   Filed Vitals:   11/21/14 0957  BP: 130/72  Pulse: 65  Height: 5\' 1"  (1.549 m)  Weight: 147 lb (66.679 kg)   Gen:  Appears stated age and in NAD. HEENT:  Normocephalic, atraumatic. The mucous membranes are moist. The superficial temporal arteries are without ropiness or tenderness. Cardiovascular: Regular rate and rhythm. Lungs: Clear to auscultation bilaterally. Neck: There are no carotid bruits noted bilaterally.  NEUROLOGICAL:  Orientation:  She scored a 20/30 on her MMSE on 11/21/14.  She missed the year and date.  Had difficulty with spelling backwards as well as recall. Cranial nerves: There is good facial symmetry. The pupils are equal round and reactive to light bilaterally. Fundoscopic exam reveals clear disc margins bilaterally. Extraocular muscles are intact and visual fields are  full to confrontational testing. Speech is fluent and clear. Soft palate rises symmetrically and there is no tongue deviation. Hearing is intact to conversational tone. Tone: Tone is good throughout. Sensation: Sensation is intact to light touch and pinprick throughout (facial, trunk, extremities). Vibration is decreased distally. There is no extinction with double simultaneous stimulation. There is no sensory dermatomal level identified. Coordination:  The patient has no dysdiadichokinesia or dysmetria. Motor: Strength is 5/5 in the bilateral upper and lower extremities.  Shoulder shrug is equal bilaterally.  There is no pronator drift.  There are no  fasciculations noted. DTR's: Deep tendon reflexes are 2-/4 at the bilateral biceps, triceps, brachioradialis, 2+ patella and trace at the bilateral achilles.  Plantar responses are downgoing bilaterally. Gait and Station: The patient is able to ambulate without difficulty. She is slow and antalgic and ambulates with the cane  MOVEMENT EXAM: Tremor:  There is no hand tremor.  There is no significant trouble with archimedes spirals.  She has complex head titubation but mostly in the "yes" direction.  No evidence of cervical dystonia.     Assessment/Plan:   1.  Essential Tremor.  -This is evidenced by the symmetrical nature and longstanding hx of gradually getting worse.  -The patient is already on primidone, 150 g per day.  Higher dosages were not of benefit.  She is also on a beta blocker, primarily for blood pressure.  This is at a low dose (50 mg, half tablet daily).  However, her biggest complaints are head tremor and I told the patient and her daughter that head tremor is notoriously resistant to medical therapy.  While head tremor can respond to DBS therapy, she is not a candidate for this given memory change.  I also wonder if the primidone is helping at all; her daughters are doubtful and the pt doesn't know.  She wants to try and wean off and I  have no objection but told her to do it slowly (drop one pill a week).  If head tremor gets worse we could always go back on it.  I told her that I think that I would only be doing her a disservice by trying additional medication. 2.  Hand paresthesias, when using the telephone  -likely represents carpal tunnel syndrome and she and I talked about this.  Not surgical candidate.  Cock up wrist splints may be of benefit at least on nightly basis 3.  Mild dementia  -daughters managing meds and finances.  Pt living alone and will need to watch to make sure that still stable over time to live alone 4.  Much greater than 50% of this visit was spent in counseling with the patient and the family.  Total face to face time:  45 min

## 2014-12-03 ENCOUNTER — Other Ambulatory Visit: Payer: Self-pay | Admitting: Family Medicine

## 2014-12-03 NOTE — Telephone Encounter (Signed)
Last office visit 11/10/2014.  Last refilled 11/06/2014 for #30 with no refills.  Ok to refill?

## 2014-12-06 NOTE — Telephone Encounter (Signed)
Tramadol called into [facility].

## 2015-01-09 ENCOUNTER — Other Ambulatory Visit: Payer: Self-pay | Admitting: *Deleted

## 2015-01-09 NOTE — Telephone Encounter (Signed)
Last office visit 11/10/2014.  Last refilled 12/06/2014 for #30 with no refills.  Ok to refill?

## 2015-01-10 MED ORDER — TRAMADOL HCL 50 MG PO TABS: 50.0000 mg | ORAL_TABLET | Freq: Two times a day (BID) | ORAL | Status: DC | PRN

## 2015-01-10 NOTE — Telephone Encounter (Signed)
Tramadol called into [facility]. 

## 2015-03-02 ENCOUNTER — Other Ambulatory Visit: Payer: Self-pay | Admitting: Family Medicine

## 2015-03-07 ENCOUNTER — Encounter: Payer: Self-pay | Admitting: Cardiology

## 2015-05-11 ENCOUNTER — Encounter: Payer: Self-pay | Admitting: Family Medicine

## 2015-05-11 ENCOUNTER — Ambulatory Visit (INDEPENDENT_AMBULATORY_CARE_PROVIDER_SITE_OTHER): Payer: Medicare Other | Admitting: Family Medicine

## 2015-05-11 VITALS — BP 143/68 | HR 62 | Temp 97.8°F | Ht 60.0 in | Wt 143.8 lb

## 2015-05-11 DIAGNOSIS — M810 Age-related osteoporosis without current pathological fracture: Secondary | ICD-10-CM | POA: Diagnosis not present

## 2015-05-11 DIAGNOSIS — F0390 Unspecified dementia without behavioral disturbance: Secondary | ICD-10-CM

## 2015-05-11 DIAGNOSIS — Z0000 Encounter for general adult medical examination without abnormal findings: Secondary | ICD-10-CM

## 2015-05-11 DIAGNOSIS — E559 Vitamin D deficiency, unspecified: Secondary | ICD-10-CM | POA: Diagnosis not present

## 2015-05-11 DIAGNOSIS — E538 Deficiency of other specified B group vitamins: Secondary | ICD-10-CM | POA: Diagnosis not present

## 2015-05-11 DIAGNOSIS — D509 Iron deficiency anemia, unspecified: Secondary | ICD-10-CM

## 2015-05-11 DIAGNOSIS — G4700 Insomnia, unspecified: Secondary | ICD-10-CM | POA: Diagnosis not present

## 2015-05-11 DIAGNOSIS — Z7189 Other specified counseling: Secondary | ICD-10-CM

## 2015-05-11 DIAGNOSIS — I10 Essential (primary) hypertension: Secondary | ICD-10-CM

## 2015-05-11 DIAGNOSIS — F03A0 Unspecified dementia, mild, without behavioral disturbance, psychotic disturbance, mood disturbance, and anxiety: Secondary | ICD-10-CM

## 2015-05-11 DIAGNOSIS — F5104 Psychophysiologic insomnia: Secondary | ICD-10-CM

## 2015-05-11 LAB — LIPID PANEL
CHOL/HDL RATIO: 3
Cholesterol: 134 mg/dL (ref 0–200)
HDL: 44.2 mg/dL (ref >39.00)
LDL Cholesterol: 57 mg/dL (ref 0–99)
NONHDL: 89.59
Triglycerides: 164 mg/dL — ABNORMAL HIGH (ref 0.0–149.0)
VLDL: 32.8 mg/dL (ref 0.0–40.0)

## 2015-05-11 LAB — COMPREHENSIVE METABOLIC PANEL
ALT: 13 U/L (ref 0–35)
AST: 19 U/L (ref 0–37)
Albumin: 4.1 g/dL (ref 3.5–5.2)
Alkaline Phosphatase: 63 U/L (ref 39–117)
BUN: 15 mg/dL (ref 6–23)
CHLORIDE: 104 meq/L (ref 96–112)
CO2: 30 meq/L (ref 19–32)
Calcium: 9.8 mg/dL (ref 8.4–10.5)
Creatinine, Ser: 0.81 mg/dL (ref 0.40–1.20)
GFR: 70.69 mL/min (ref >60.00)
GLUCOSE: 100 mg/dL — AB (ref 70–99)
POTASSIUM: 3.6 meq/L (ref 3.5–5.1)
Sodium: 142 mEq/L (ref 135–145)
Total Bilirubin: 0.7 mg/dL (ref 0.2–1.2)
Total Protein: 7.1 g/dL (ref 6.0–8.3)

## 2015-05-11 LAB — CBC WITH DIFFERENTIAL/PLATELET
BASOS PCT: 0.7 % (ref 0.0–3.0)
Basophils Absolute: 0 10*3/uL (ref 0.0–0.1)
EOS ABS: 0.1 10*3/uL (ref 0.0–0.7)
EOS PCT: 1.9 % (ref 0.0–5.0)
HEMATOCRIT: 36.6 % (ref 36.0–46.0)
Hemoglobin: 12.5 g/dL (ref 12.0–15.0)
Lymphocytes Relative: 31.1 % (ref 12.0–46.0)
Lymphs Abs: 1.9 10*3/uL (ref 0.7–4.0)
MCHC: 34.2 g/dL (ref 30.0–36.0)
MCV: 94.8 fl (ref 78.0–100.0)
MONO ABS: 0.4 10*3/uL (ref 0.1–1.0)
Monocytes Relative: 7 % (ref 3.0–12.0)
NEUTROS ABS: 3.6 10*3/uL (ref 1.4–7.7)
Neutrophils Relative %: 59.3 % (ref 43.0–77.0)
PLATELETS: 287 10*3/uL (ref 150.0–400.0)
RBC: 3.86 Mil/uL — ABNORMAL LOW (ref 3.87–5.11)
RDW: 13 % (ref 11.5–15.5)
WBC: 6.1 10*3/uL (ref 4.0–10.5)

## 2015-05-11 MED ORDER — TRAMADOL HCL 50 MG PO TABS: 50.0000 mg | ORAL_TABLET | Freq: Two times a day (BID) | ORAL | Status: DC | PRN

## 2015-05-11 MED ORDER — TRAZODONE HCL 50 MG PO TABS: 25.0000 mg | ORAL_TABLET | Freq: Every day | ORAL | Status: DC

## 2015-05-11 NOTE — Progress Notes (Signed)
Pre visit review using our clinic review tool, if applicable. No additional management support is needed unless otherwise documented below in the visit note. 

## 2015-05-11 NOTE — Assessment & Plan Note (Signed)
Trial of trazodone low dose. Reviewed risk and benefits.

## 2015-05-11 NOTE — Assessment & Plan Note (Signed)
Well controlled. Continue current medication.  

## 2015-05-11 NOTE — Assessment & Plan Note (Signed)
[facility] [name], daughter), has living will, considering DNR (Reviewed 2017)

## 2015-05-11 NOTE — Assessment & Plan Note (Signed)
Due for re-eval bone density of fosamax.

## 2015-05-11 NOTE — Patient Instructions (Addendum)
Trail of trazodone.. Try lowest effective dose needed. Stop at front desk for bone density. Consider shingles and Tdap .Marland Kitchen Call insurance to consider.

## 2015-05-11 NOTE — Addendum Note (Signed)
Addended by: Eliezer Lofts E on: 05/11/2015 11:30 AM   Modules accepted: Orders, SmartSet

## 2015-05-11 NOTE — Assessment & Plan Note (Signed)
Return for full MMSE eval.

## 2015-05-11 NOTE — Progress Notes (Signed)
82 year old female pt presents for medicare wellnes. I have personally reviewed the Medicare Annual Wellness questionnaire and have noted 1.The patient's medical and social history 2.Their use of alcohol, tobacco or illicit drugs 3.Their current medications and supplements 4.The patient's functional ability including ADL's, fall risks, home safety risks and hearing or visual  impairment. 5.Diet and physical activities 6.Evidence for depression or mood disorders` The patients weight, height, BMI and visual acuity have been recorded in the chart I have made referrals, counseling and provided education to the patient based review of the above and I have provided the pt with a written personalized care plan for preventive services.  Mild dementia: on on medication. No clear change per pt.  Living on own a house by herself.  Ben ess tremor: followed by Dr. Carles Collet. Last OV recommended to wean of primidone. She is no longer on this medication.   Head tremor has  Worsened slightly off primidone. Neck hurts she feels may be to the tremor. Tramadol or tylenol helps some with the pain. Needs refill.  Neck DDD. Last [facility] 05/2014.Marland Kitchen WIll call Dr. Ernestina Patches for possible repeat ESI.    Poor sleep at night x 1 month: Insomnia in last year.. melatonin not helping any longer.. Does cat nap during the day 10 min.  Mid back pain: Hx of DDD, arthritis Followed by Dr. Ernestina Patches,  [place] On 05/12/2014. On tramadol for pain.  Hypertension:    Tolerable control on hyzaar and diltiazem BP Readings from Last 3 Encounters:  05/11/15 143/68  11/21/14 130/72  11/10/14 120/60  Using medication without problems or lightheadedness: None Chest pain with exertion:None Edema: off and on , using lasix as needed. Short of breath: Average home BPs: not checkcing. Other issues:   She comes to clinic today with her daughter.  Diet: healthy except limited with  dysphagia history.  GERD hx of fundoplication for large hiatal hernia.On omeprazole daily. Miralax for constipation but does not take regularly. She has BM every 2-3 days. Saw Dr. Henrene Pastor in 03/2014. Eats a lot of processed food.  Exercise: Limited. Uses walker or cane.  Reviewed labs in detail.  Elevated Cholesterol: Well controlled on pravastatin. Goal< 130 Lab Results  Component Value Date   CHOL 142 04/28/2014   HDL 49.50 04/28/2014   LDLCALC 70 04/28/2014   LDLDIRECT 157.8 05/18/2010   TRIG 114.0 04/28/2014   CHOLHDL 3 04/28/2014  Using medications without problems:No She would like to continue taking this medication.  Vit D : on vit D3 400 units daily.    Review of Systems  All other systems reviewed and are negative.      Objective:   Physical Exam  Constitutional: Vital signs are normal. She appears well-developed and well-nourished. She is cooperative. Non-toxic appearance. She does not appear ill. No distress.  Elderly female in NAD hearing aides  HENT:  Head: Normocephalic.  Right Ear: Hearing, tympanic membrane, external ear and ear canal normal. Tympanic membrane is not erythematous, not retracted and not bulging.  Left Ear: Hearing, tympanic membrane, external ear and ear canal normal. Tympanic membrane is not erythematous, not retracted and not bulging.  Nose: Nose normal. No mucosal edema or rhinorrhea. Right sinus exhibits no maxillary sinus tenderness and no frontal sinus tenderness. Left sinus exhibits no maxillary sinus tenderness and no frontal sinus tenderness.  Mouth/Throat: Uvula is midline, oropharynx is clear and moist and mucous membranes are normal.  Eyes: Conjunctivae, EOM and lids are normal. Pupils are equal, round, and reactive  to light. Lids are everted and swept, no foreign bodies found.  Neck: Trachea normal and normal range of motion. Neck supple. Carotid bruit is not present. No thyroid mass and no thyromegaly present.   Cardiovascular: Normal rate, regular rhythm, S1 normal, S2 normal, normal heart sounds, intact distal pulses and normal pulses. Exam reveals no gallop and no friction rub.  No murmur heard. Pulmonary/Chest: Effort normal and breath sounds normal. No tachypnea. No respiratory distress. She has no decreased breath sounds. She has no wheezes. She has no rhonchi. She has no rales.  Abdominal: Soft. Normal appearance and bowel sounds are normal. She exhibits no distension, no fluid wave, no abdominal bruit and no mass. There is no hepatosplenomegaly. There is no tenderness. There is no rebound, no guarding and no CVA tenderness. No hernia.  Lymphadenopathy:   She has no cervical adenopathy.   She has no axillary adenopathy.  Neurological: She is alert. She has normal strength. No cranial nerve deficit or sensory deficit.  Skin: Skin is warm, dry and intact. No rash noted.  Psychiatric: Her speech is normal and behavior is normal. Judgment and thought content normal. Her mood appears not anxious. Cognition and memory are normal. She does not exhibit a depressed mood.          Assessment & Plan:  The patient's preventative maintenance and recommended screening tests for an annual wellness exam were reviewed in full today. Brought up to date unless services declined.  Counselled on the importance of diet, exercise, and its role in overall health and mortality. The patient's FH and SH was reviewed, including their home life, tobacco status, and drug and alcohol status.   She had been on alendronate. Thinks she has been on many years. Last DEXA 2014 T -2.6 Will plan to stop alendronate. Plan recheck in 1 year (2017). Vaccines:uptodate with flur pcv 23, 13, Refused Tdap, shingles (may have had in past).  Colon: not indicated Mammo: not indicated. PAP/DVE: not indicated. Nonsmoker

## 2015-05-12 ENCOUNTER — Encounter: Payer: Self-pay | Admitting: *Deleted

## 2015-05-12 LAB — VITAMIN D 25 HYDROXY (VIT D DEFICIENCY, FRACTURES): VITD: 50.02 ng/mL (ref 30.00–100.00)

## 2015-05-12 LAB — VITAMIN B12: Vitamin B-12: >1500 pg/mL — ABNORMAL HIGH (ref 211–911)

## 2015-06-01 ENCOUNTER — Other Ambulatory Visit: Payer: Self-pay | Admitting: Family Medicine

## 2015-06-08 ENCOUNTER — Other Ambulatory Visit: Payer: Self-pay

## 2015-06-08 MED ORDER — TRAMADOL HCL 50 MG PO TABS: 50.0000 mg | ORAL_TABLET | Freq: Two times a day (BID) | ORAL | Status: DC | PRN

## 2015-06-08 NOTE — Telephone Encounter (Signed)
Last office visit 05/11/15, last refill 05/11/15 #30 no refills. Okay to refill?

## 2015-06-09 NOTE — Telephone Encounter (Signed)
Tramadol called into [facility].

## 2015-06-26 ENCOUNTER — Encounter: Payer: Self-pay | Admitting: Family Medicine

## 2015-07-18 ENCOUNTER — Other Ambulatory Visit: Payer: Self-pay | Admitting: *Deleted

## 2015-07-18 MED ORDER — TRAMADOL HCL 50 MG PO TABS: 50.0000 mg | ORAL_TABLET | Freq: Two times a day (BID) | ORAL | Status: DC | PRN

## 2015-07-18 NOTE — Telephone Encounter (Signed)
Tramadol called into [facility].

## 2015-07-18 NOTE — Telephone Encounter (Signed)
Last office visit 05/11/2015.  Last refilled 06/08/2015 for #30 with no refills.  Ok to refill?

## 2015-08-09 ENCOUNTER — Other Ambulatory Visit: Payer: Self-pay | Admitting: Family Medicine

## 2015-08-09 NOTE — Telephone Encounter (Signed)
Last office visit 05/11/2015.  Last refilled 05/11/2015 for #30 with 1 refill.  Ok to refill?

## 2015-08-14 ENCOUNTER — Telehealth: Payer: Self-pay | Admitting: Internal Medicine

## 2015-08-14 MED ORDER — AMBULATORY NON FORMULARY MEDICATION: Status: DC

## 2015-08-14 NOTE — Telephone Encounter (Signed)
Faxed rx for GI cocktail and then spoke with daughter to let her know

## 2015-08-18 ENCOUNTER — Telehealth: Payer: Self-pay

## 2015-08-18 NOTE — Telephone Encounter (Signed)
-----   Message from Irene Shipper, MD sent at 08/18/2015  9:13 AM EDT ----- Maalox and simethicone should suffice ----- Message -----    From: Audrea Muscat, CMA    Sent: 08/17/2015   3:14 PM      To: Irene Shipper, MD  Patient is prescribed a GI cocktail infrequently that she keeps on hand for flares of reflux.  Her insurance will suddenly not pay for the Donnatal part of the medication.  Should I have them mix it with just the Maalox and Viscous Lidocaine or give her something else.  Thanks!

## 2015-08-24 ENCOUNTER — Other Ambulatory Visit: Payer: Self-pay | Admitting: Family Medicine

## 2015-08-24 NOTE — Telephone Encounter (Signed)
Spoke with pharmacist who told me that Patient had already decided to pay cash because she really wanted the Carafate quickly but the pharmacist is going to make a note that going forward he will just mix Lidocaine and Maalox (Donnatal not covered by insurance) to keep the price down.

## 2015-09-06 ENCOUNTER — Other Ambulatory Visit: Payer: Self-pay | Admitting: Family Medicine

## 2015-11-10 ENCOUNTER — Other Ambulatory Visit: Payer: Self-pay | Admitting: Family Medicine

## 2015-11-10 NOTE — Telephone Encounter (Signed)
Last filled 10-13-15 #30 Last OV 05-11-15 No Future OV

## 2015-11-10 NOTE — Telephone Encounter (Signed)
Verbal refill given to Preston at the pharmacy

## 2015-11-24 ENCOUNTER — Other Ambulatory Visit: Payer: Self-pay | Admitting: Family Medicine

## 2015-11-24 NOTE — Telephone Encounter (Signed)
Last office visit 05/11/2015.  AVS states to follow up in 3 months on memory. No future appointments.  Ok to refill?

## 2015-12-08 ENCOUNTER — Other Ambulatory Visit: Payer: Self-pay | Admitting: Family Medicine

## 2015-12-08 NOTE — Telephone Encounter (Signed)
Last f/u 04/2015-CPE 

## 2015-12-08 NOTE — Telephone Encounter (Signed)
Rx called in to requested pharmacy 

## 2016-01-05 ENCOUNTER — Other Ambulatory Visit: Payer: Self-pay | Admitting: *Deleted

## 2016-01-05 MED ORDER — TRAMADOL HCL 50 MG PO TABS: ORAL_TABLET | ORAL | 1 refills | Status: DC

## 2016-01-05 NOTE — Telephone Encounter (Signed)
Tramadol called into [facility].

## 2016-01-05 NOTE — Telephone Encounter (Signed)
Last office visit 05/11/2015.  Last refilled 12/08/2015 at #30 with 1 refill.  Ok to refill?

## 2016-02-06 ENCOUNTER — Other Ambulatory Visit: Payer: Self-pay | Admitting: Family Medicine

## 2016-03-08 ENCOUNTER — Other Ambulatory Visit: Payer: Self-pay | Admitting: *Deleted

## 2016-03-08 MED ORDER — TRAMADOL HCL 50 MG PO TABS: ORAL_TABLET | ORAL | 1 refills | Status: DC

## 2016-03-08 NOTE — Telephone Encounter (Signed)
Last office visit 05/11/2015.  Last refilled 01/05/2016 for #30 with 1 refill.  Ok to refill?

## 2016-03-08 NOTE — Telephone Encounter (Signed)
Tramadol called into [facility], [place] Phone: [phone]

## 2016-03-13 ENCOUNTER — Encounter: Payer: Self-pay | Admitting: Adult Health

## 2016-03-13 ENCOUNTER — Telehealth: Payer: Self-pay | Admitting: Adult Health

## 2016-03-13 ENCOUNTER — Telehealth: Payer: Self-pay

## 2016-03-13 ENCOUNTER — Ambulatory Visit (INDEPENDENT_AMBULATORY_CARE_PROVIDER_SITE_OTHER): Payer: Medicare Other | Admitting: Adult Health

## 2016-03-13 VITALS — BP 144/74 | HR 60 | Temp 98.3°F | Ht 61.0 in | Wt 141.8 lb

## 2016-03-13 DIAGNOSIS — I10 Essential (primary) hypertension: Secondary | ICD-10-CM

## 2016-03-13 DIAGNOSIS — E559 Vitamin D deficiency, unspecified: Secondary | ICD-10-CM

## 2016-03-13 DIAGNOSIS — F0390 Unspecified dementia without behavioral disturbance: Secondary | ICD-10-CM

## 2016-03-13 DIAGNOSIS — M810 Age-related osteoporosis without current pathological fracture: Secondary | ICD-10-CM

## 2016-03-13 DIAGNOSIS — G250 Essential tremor: Secondary | ICD-10-CM

## 2016-03-13 DIAGNOSIS — D509 Iron deficiency anemia, unspecified: Secondary | ICD-10-CM

## 2016-03-13 DIAGNOSIS — I499 Cardiac arrhythmia, unspecified: Secondary | ICD-10-CM

## 2016-03-13 DIAGNOSIS — F5104 Psychophysiologic insomnia: Secondary | ICD-10-CM

## 2016-03-13 DIAGNOSIS — F03A0 Unspecified dementia, mild, without behavioral disturbance, psychotic disturbance, mood disturbance, and anxiety: Secondary | ICD-10-CM

## 2016-03-13 DIAGNOSIS — M545 Low back pain, unspecified: Secondary | ICD-10-CM

## 2016-03-13 DIAGNOSIS — Z23 Encounter for immunization: Secondary | ICD-10-CM

## 2016-03-13 DIAGNOSIS — K219 Gastro-esophageal reflux disease without esophagitis: Secondary | ICD-10-CM

## 2016-03-13 DIAGNOSIS — G8929 Other chronic pain: Secondary | ICD-10-CM

## 2016-03-13 DIAGNOSIS — E538 Deficiency of other specified B group vitamins: Secondary | ICD-10-CM

## 2016-03-13 NOTE — Assessment & Plan Note (Signed)
Continue Vit d supplements.

## 2016-03-13 NOTE — Assessment & Plan Note (Signed)
Continue home exercises.

## 2016-03-13 NOTE — Assessment & Plan Note (Signed)
Continue trazadone and practice good sleep hygiene.

## 2016-03-13 NOTE — Assessment & Plan Note (Signed)
Continue current medications.  Call clinic immediately if palpitations occur.

## 2016-03-13 NOTE — Assessment & Plan Note (Signed)
If dementia interferes with ADLs then please call clinic.

## 2016-03-13 NOTE — Telephone Encounter (Signed)
No listing for shingles vaccine given at their office-glh

## 2016-03-13 NOTE — Assessment & Plan Note (Signed)
Continue medications and home stretching.

## 2016-03-13 NOTE — Assessment & Plan Note (Signed)
Continue B12 supplements.  

## 2016-03-13 NOTE — Patient Instructions (Signed)
Hypertension Hypertension is another name for high blood pressure. High blood pressure forces your heart to work harder to pump blood. A blood pressure reading has two numbers, which includes a higher number over a lower number (example: 110/72). Follow these instructions at home:  Have your blood pressure rechecked by your doctor.  Only take medicine as told by your doctor. Follow the directions carefully. The medicine does not work as well if you skip doses. Skipping doses also puts you at risk for problems.  Do not smoke.  Monitor your blood pressure at home as told by your doctor. Contact a doctor if:  You think you are having a reaction to the medicine you are taking.  You have repeat headaches or feel dizzy.  You have puffiness (swelling) in your ankles.  You have trouble with your vision. Get help right away if:  You get a very bad headache and are confused.  You feel weak, numb, or faint.  You get chest or belly (abdominal) pain.  You throw up (vomit).  You cannot breathe very well. This information is not intended to replace advice given to you by your health care provider. Make sure you discuss any questions you have with your health care provider. Document Released: 07/24/2007 Document Revised: 07/13/2015 Document Reviewed: 11/27/2012 Elsevier Interactive Patient Education  2017 Elsevier Inc.   Cholesterol Cholesterol is a fat. Your body needs a small amount of cholesterol. Cholesterol (plaque) may build up in your blood vessels (arteries). That makes you more likely to have a heart attack or stroke. You cannot feel your cholesterol level. Having a blood test is the only way to find out if your level is high. Keep your test results. Work with your doctor to keep your cholesterol at a good level. What do the results mean?  Total cholesterol is how much cholesterol is in your blood.  LDL is bad cholesterol. This is the type that can build up. Try to have low  LDL.  HDL is good cholesterol. It cleans your blood vessels and carries LDL away. Try to have high HDL.  Triglycerides are fat that the body can store or burn for energy. What are good levels of cholesterol?  Total cholesterol below 200.  LDL below 100 is good for people who have health risks. LDL below 70 is good for people who have very high risks.  HDL above 40 is good. It is best to have HDL of 60 or higher.  Triglycerides below 150. How can I lower my cholesterol? Diet  Follow your diet program as told by your doctor.  Choose fish, white meat chicken, or [place] that is roasted or baked. Try not to eat red meat, fried foods, sausage, or lunch meats.  Eat lots of fresh fruits and vegetables.  Choose whole grains, beans, pasta, potatoes, and cereals.  Choose olive oil, corn oil, or canola oil. Only use small amounts.  Try not to eat butter, mayonnaise, shortening, or palm kernel oils.  Try not to eat foods with trans fats.  Choose low-fat or nonfat dairy foods.  Drink skim or nonfat milk.  Eat low-fat or nonfat yogurt and cheeses.  Try not to drink whole milk or cream.  Try not to eat ice cream, egg yolks, or full-fat cheeses.  Healthy desserts include angel food cake, ginger snaps, animal crackers, hard candy, popsicles, and low-fat or nonfat frozen yogurt. Try not to eat pastries, cakes, pies, and cookies. Exercise  Follow your exercise program as told by  your doctor.  Be more active. Try gardening, walking, and taking the stairs.  Ask your doctor about ways that you can be more active. Medicine  Take over-the-counter and prescription medicines only as told by your doctor. This information is not intended to replace advice given to you by your health care provider. Make sure you discuss any questions you have with your health care provider. Document Released: 05/03/2008 Document Revised: 09/06/2015 Document Reviewed: 08/17/2015 Elsevier Interactive Patient  Education  2017 [place].  Please schedule fasting lab nurse-visit. Continue medications as directed.  Continue home exercises and using either cane or walker. Please return in 6 months or sooner if needed.

## 2016-03-13 NOTE — Assessment & Plan Note (Signed)
Eat foods high in iron:  -

## 2016-03-13 NOTE — Progress Notes (Signed)
Subjective:    Patient ID: [name], female    DOB: Jun 11, 1923, 83 y.o.   MRN: [id number]  HPI:  [name] is here is establish as new pt.  She is accompanied by her two daughters.  She reports that she "feels well overall".  She still drives, "only a few places that are close to home".  Only acute issues are generalized body aches and early waking in am.  She has a strong support system of local family and friends.   Patient Care Team    Relationship Specialty Notifications Start End  Odella Aquas, NP PCP - General Family Medicine  03/13/16   Magnus Sinning, MD Consulting Physician Physical Medicine and Rehabilitation  03/13/16   Bo Merino, MD Consulting Physician Rheumatology  03/13/16   Irene Shipper, MD Consulting Physician Gastroenterology  03/13/16   Webb Laws, OD Referring Physician Optometry  03/13/16     Patient Active Problem List   Diagnosis Date Noted  . Chronic insomnia 05/11/2015  . Vitamin D deficiency 05/17/2014  . Counseling regarding end of life decision making 05/03/2014  . Mild dementia 05/03/2014  . Lumbago 05/11/2012  . Vitamin B 12 deficiency 05/11/2012  . Dysphagia, pharyngoesophageal phase 12/17/2010  . Esophageal reflux 12/17/2010  . Abdominal hernia 12/17/2010  . Anemia, iron deficiency 12/06/2010  . HYPERCHOLESTEROLEMIA, MILD 11/19/2008  . Venous (peripheral) insufficiency 11/14/2007  . Essential hypertension, benign 05/14/2007  . Arrhythmia 05/14/2007  . CONSTIPATION, CHRONIC 05/14/2007  . Osteoporosis 05/14/2007  . Benign essential tremor 05/14/2007  . HIATAL HERNIA 12/18/2006  . DEGENERATIVE JOINT DISEASE, GENERALIZED 12/18/2006     Past Medical History:  Diagnosis Date  . Abnormal involuntary movements(781.0)   . Anemia   . Anxiety state, unspecified   . Blood transfusion   . Cardiac dysrhythmia, unspecified   . Chronic constipation   . DJD (degenerative joint disease)   . Esophageal stricture   . Generalized  osteoarthrosis, unspecified site   . GERD (gastroesophageal reflux disease)   . Hiatal hernia   . Hypertension   . Osteoporosis   . Pelvic adhesions   . Postgastrectomy syndrome   . Pure hypercholesterolemia   . Tortuous colon   . Urinary tract infection, site not specified   . Venous insufficiency   . Vitamin B12 deficiency      Past Surgical History:  Procedure Laterality Date  . CHOLECYSTECTOMY    . COLONOSCOPY    . ESOPHAGUS SURGERY  2003  . NISSEN FUNDOPLICATION    . TOTAL ABDOMINAL HYSTERECTOMY     DUB,   . UPPER GASTROINTESTINAL ENDOSCOPY       Family History  Problem Relation Age of Onset  . Cancer Mother     unknown type, most likely colon  . Heart disease Brother   . Arthritis Father   . Heart disease Sister      History  Drug Use No     History  Alcohol Use No     History  Smoking Status  . Never Smoker  Smokeless Tobacco  . Never Used     Outpatient Encounter Prescriptions as of 03/13/2016  Medication Sig  . acetaminophen (TYLENOL) 500 MG tablet Take 1,000 mg by mouth as needed.   . AMBULATORY NON FORMULARY MEDICATION Medication Name: GI Cocktail with 20cc Maalox, 10cc Donnatal, 20cc Viscous Lidocaine 2%, take 30cc dose q8hr as needed for GI upset.  . butalbital-acetaminophen-caffeine (FIORICET, ESGIC) 50-325-40 MG per tablet Take 1 tablet by mouth every  6 (six) hours as needed for headache.  . Calcium-Vitamin D (CALTRATE 600 PLUS-VIT D PO) Take 1 tablet by mouth daily.  . Cyanocobalamin 5000 MCG TBDP Take 1 tablet by mouth daily.  Marland Kitchen diltiazem (CARDIZEM CD) 180 MG 24 hr capsule TAKE 1 CAPSULE BY MOUTH EVERY DAY.  Marland Kitchen losartan-hydrochlorothiazide (HYZAAR) 50-12.5 MG tablet TAKE ONE TABLET BY MOUTH ONCE DAILY  . metoprolol succinate (TOPROL-XL) 50 MG 24 hr tablet TAKE 1/2 TABLET BY MOUTH EVERY DAY.  Marland Kitchen MISC NATURAL PRODUCTS PO Take 1 tablet by mouth daily. Ginger & Turmeric  . Multiple Vitamins-Minerals (PRESERVISION AREDS 2 PO) Take 2 capsules  by mouth daily.    Marland Kitchen omeprazole (PRILOSEC) 20 MG capsule TAKE 1 CAPSULE BY MOUTH DAILY.  . pravastatin (PRAVACHOL) 40 MG tablet TAKE 1 TABLET BY MOUTH ONCE DAILY IN THE EVENING.  . traMADol (ULTRAM) 50 MG tablet TAKE 1 TABLET BY MOUTH 2 TIMES A DAY AS NEEDED.  Marland Kitchen traZODone (DESYREL) 50 MG tablet TAKE 1/2 TO 1 TABLET BY MOUTH AT BEDTIME.   No facility-administered encounter medications on file as of 03/13/2016.     Allergies: Patient has no known allergies.  Body mass index is 26.79 kg/m.  Blood pressure (!) 144/74, pulse 60, temperature 98.3 F (36.8 C), height 5\' 1"  (1.549 m), weight 141 lb 12.8 oz (64.3 kg), SpO2 96 %.     Review of Systems  Constitutional: Positive for fatigue. Negative for activity change, appetite change and unexpected weight change.  HENT: Positive for hearing loss. Negative for congestion, postnasal drip, trouble swallowing and voice change.   Eyes: Negative for visual disturbance.  Respiratory: Negative for cough, chest tightness, shortness of breath and wheezing.   Cardiovascular: Negative for chest pain, palpitations and leg swelling.  Gastrointestinal: Negative for abdominal distention, constipation, diarrhea, nausea and vomiting.  Endocrine: Negative for cold intolerance, heat intolerance, polydipsia, polyphagia and polyuria.  Genitourinary: Negative for difficulty urinating and flank pain.  Musculoskeletal: Positive for arthralgias, back pain, gait problem, myalgias, neck pain and neck stiffness.       Ambulates safely with cane or walker.  Neurological: Positive for tremors, weakness and numbness. Negative for dizziness, speech difficulty, light-headedness and headaches.  Psychiatric/Behavioral: Positive for confusion and sleep disturbance. Negative for agitation, behavioral problems, hallucinations and suicidal ideas. The patient is not nervous/anxious.        Objective:   Physical Exam  Constitutional: She is oriented to person, place, and time.  She appears well-developed and well-nourished. No distress.  HENT:  Head: Normocephalic and atraumatic.  Left Ear: External ear normal.  Mouth/Throat: Oropharynx is clear and moist.  Cerumen build up in right ear. Bil hearing aids.    Eyes: Pupils are equal, round, and reactive to light.  Neck: Normal range of motion. Neck supple. No JVD present. No tracheal deviation present.  Cardiovascular: Normal rate, regular rhythm, normal heart sounds and intact distal pulses.   Pulmonary/Chest: Breath sounds normal. She is in respiratory distress. She has no wheezes. She has no rales. She exhibits no tenderness.  Abdominal: Soft. Bowel sounds are normal. She exhibits no distension. There is no tenderness.  Musculoskeletal: She exhibits tenderness.  Lymphadenopathy:    She has no cervical adenopathy.  Neurological: She is alert and oriented to person, place, and time. She has normal reflexes.  Skin: Skin is warm and dry. She is not diaphoretic.  Psychiatric: She has a normal mood and affect. Her behavior is normal. Judgment and thought content normal.  Assessment & Plan:   1. Essential hypertension, benign   2. Cardiac arrhythmia, unspecified cardiac arrhythmia type   3. Need for Tdap vaccination   4. Gastroesophageal reflux disease without esophagitis   5. Benign essential tremor   6. Mild dementia   7. Age-related osteoporosis without current pathological fracture   8. Iron deficiency anemia, unspecified iron deficiency anemia type   9. Chronic left-sided low back pain without sciatica   10. Vitamin D deficiency   11. Chronic insomnia   12. Vitamin B 12 deficiency     Essential hypertension, benign Continue current medications.    Arrhythmia Continue current medications.  Call clinic immediately if palpitations occur.  Esophageal reflux Continue medication and avoid acidic foods.  Benign essential tremor Please call clinic if tremor worsens.  Mild dementia If  dementia interferes with ADLs then please call clinic.  Osteoporosis Continue home exercises.  Anemia, iron deficiency Eat foods high in iron.  Lumbago Continue medications and home stretching.  Vitamin B 12 deficiency Continue B12 supplements.  Vitamin D deficiency Continue Vit d supplements.    Chronic insomnia Continue trazadone and practice good sleep hygiene.    FOLLOW-UP:  Return in about 6 months (around 09/10/2016) for Regular Follow Up.   Please schedule fasting lab nurse-visit ASAP.  Will call with the results.

## 2016-03-13 NOTE — Assessment & Plan Note (Signed)
Please call clinic if tremor worsens.

## 2016-03-13 NOTE — Telephone Encounter (Signed)
Estill Bamberg with [facility] left v/m to see if pt had Zostavax. Do not see on immunization list or under Media tab.Estill Bamberg not available but Baker Janus notified and voiced understanding.

## 2016-03-13 NOTE — Assessment & Plan Note (Signed)
Continue current medications. 

## 2016-03-13 NOTE — Assessment & Plan Note (Signed)
Continue medication and avoid acidic foods.

## 2016-03-15 ENCOUNTER — Other Ambulatory Visit (INDEPENDENT_AMBULATORY_CARE_PROVIDER_SITE_OTHER): Payer: Medicare Other

## 2016-03-15 ENCOUNTER — Other Ambulatory Visit: Payer: Self-pay | Admitting: Family Medicine

## 2016-03-15 DIAGNOSIS — E538 Deficiency of other specified B group vitamins: Secondary | ICD-10-CM

## 2016-03-15 DIAGNOSIS — E559 Vitamin D deficiency, unspecified: Secondary | ICD-10-CM

## 2016-03-15 DIAGNOSIS — I10 Essential (primary) hypertension: Secondary | ICD-10-CM

## 2016-03-15 DIAGNOSIS — G250 Essential tremor: Secondary | ICD-10-CM | POA: Diagnosis not present

## 2016-03-16 LAB — CBC WITH DIFFERENTIAL/PLATELET
Basophils Absolute: 0 10*3/uL (ref 0.0–0.2)
Basos: 1 %
EOS (ABSOLUTE): 0.1 10*3/uL (ref 0.0–0.4)
EOS: 2 %
HEMATOCRIT: 35.9 % (ref 34.0–46.6)
HEMOGLOBIN: 12 g/dL (ref 11.1–15.9)
IMMATURE GRANULOCYTES: 0 %
Immature Grans (Abs): 0 10*3/uL (ref 0.0–0.1)
LYMPHS: 30 %
Lymphocytes Absolute: 1.8 10*3/uL (ref 0.7–3.1)
MCH: 31.6 pg (ref 26.6–33.0)
MCHC: 33.4 g/dL (ref 31.5–35.7)
MCV: 95 fL (ref 79–97)
MONOCYTES: 9 %
MONOS ABS: 0.5 10*3/uL (ref 0.1–0.9)
NEUTROS PCT: 58 %
Neutrophils Absolute: 3.4 10*3/uL (ref 1.4–7.0)
Platelets: 268 10*3/uL (ref 150–379)
RBC: 3.8 x10E6/uL (ref 3.77–5.28)
RDW: 13 % (ref 12.3–15.4)
WBC: 5.8 10*3/uL (ref 3.4–10.8)

## 2016-03-16 LAB — COMPREHENSIVE METABOLIC PANEL
ALBUMIN: 4.3 g/dL (ref 3.2–4.6)
ALT: 15 IU/L (ref 0–32)
AST: 26 IU/L (ref 0–40)
Albumin/Globulin Ratio: 1.7 (ref 1.2–2.2)
Alkaline Phosphatase: 77 IU/L (ref 39–117)
BUN / CREAT RATIO: 18 (ref 12–28)
BUN: 16 mg/dL (ref 10–36)
Bilirubin Total: 0.6 mg/dL (ref 0.0–1.2)
CO2: 27 mmol/L (ref 18–29)
CREATININE: 0.87 mg/dL (ref 0.57–1.00)
Calcium: 9.9 mg/dL (ref 8.7–10.3)
Chloride: 101 mmol/L (ref 96–106)
GFR calc Af Amer: 68 mL/min/{1.73_m2} (ref >59)
GFR, EST NON AFRICAN AMERICAN: 59 mL/min/{1.73_m2} — AB (ref >59)
GLOBULIN, TOTAL: 2.5 g/dL (ref 1.5–4.5)
GLUCOSE: 88 mg/dL (ref 65–99)
Potassium: 4.2 mmol/L (ref 3.5–5.2)
SODIUM: 143 mmol/L (ref 134–144)
Total Protein: 6.8 g/dL (ref 6.0–8.5)

## 2016-03-16 LAB — CHOLESTEROL, TOTAL: Cholesterol, Total: 134 mg/dL (ref 100–199)

## 2016-03-16 LAB — VITAMIN D 25 HYDROXY (VIT D DEFICIENCY, FRACTURES): Vit D, 25-Hydroxy: 50.1 ng/mL (ref 30.0–100.0)

## 2016-03-16 LAB — TSH: TSH: 1.68 u[IU]/mL (ref 0.450–4.500)

## 2016-03-16 LAB — VITAMIN B12: Vitamin B-12: >2000 pg/mL — ABNORMAL HIGH (ref 232–1245)

## 2016-03-18 ENCOUNTER — Other Ambulatory Visit: Payer: Self-pay

## 2016-03-19 ENCOUNTER — Telehealth: Payer: Self-pay | Admitting: Internal Medicine

## 2016-03-20 NOTE — Telephone Encounter (Signed)
Faxed refill of Lidocaine mixture

## 2016-04-08 DIAGNOSIS — H02002 Unspecified entropion of right lower eyelid: Secondary | ICD-10-CM | POA: Diagnosis not present

## 2016-05-27 ENCOUNTER — Telehealth (INDEPENDENT_AMBULATORY_CARE_PROVIDER_SITE_OTHER): Payer: Self-pay | Admitting: Physical Medicine and Rehabilitation

## 2016-05-27 NOTE — Telephone Encounter (Signed)
Called and left message for Ilda Basset to call back to schedule.

## 2016-05-27 NOTE — Telephone Encounter (Signed)
Ok ov 

## 2016-05-28 NOTE — Telephone Encounter (Signed)
Scheduled for ov 06/06/16 at 1000.

## 2016-06-06 ENCOUNTER — Ambulatory Visit (INDEPENDENT_AMBULATORY_CARE_PROVIDER_SITE_OTHER): Payer: Medicare Other | Admitting: Physical Medicine and Rehabilitation

## 2016-06-06 ENCOUNTER — Encounter (INDEPENDENT_AMBULATORY_CARE_PROVIDER_SITE_OTHER): Payer: Self-pay

## 2016-06-10 ENCOUNTER — Encounter (INDEPENDENT_AMBULATORY_CARE_PROVIDER_SITE_OTHER): Payer: Self-pay | Admitting: Physical Medicine and Rehabilitation

## 2016-06-10 ENCOUNTER — Ambulatory Visit (INDEPENDENT_AMBULATORY_CARE_PROVIDER_SITE_OTHER): Payer: Medicare Other | Admitting: Physical Medicine and Rehabilitation

## 2016-06-10 VITALS — BP 131/69 | HR 74

## 2016-06-10 DIAGNOSIS — M47812 Spondylosis without myelopathy or radiculopathy, cervical region: Secondary | ICD-10-CM | POA: Diagnosis not present

## 2016-06-10 DIAGNOSIS — M542 Cervicalgia: Secondary | ICD-10-CM

## 2016-06-10 DIAGNOSIS — M609 Myositis, unspecified: Secondary | ICD-10-CM | POA: Diagnosis not present

## 2016-06-10 NOTE — Progress Notes (Signed)
[name] - 83 y.o. female MRN [id number]  Date of birth: 03/01/1923  Office Visit Note: Visit Date: 06/10/2016 PCP: Lillard Anes, NP Referred by: Odella Aquas, NP  Subjective: Chief Complaint  Patient presents with  . Neck - Pain   HPI: [name] is a very pleasant left-hand dominant 83 year old female that I have not seen since 2016 who is accompanied by her daughter who provides some of the history. We have treated her in the past through Dr. Telford Nab when he still practiced as well as one of the other orthopedic surgeons. The last time I saw her we were looking at her lumbar spine and completed an epidural injection for some lateral recess stenosis and hip pain. She comes in today with chronic worsening severe at times axial neck pain really at the base of the C7 spinous process and in the trapezius area. She reports pretty constant pain not really worse during specific times of the day or positions. She does get some relief if she can find a position to lay down and. She denies any radiating symptoms in the arms or shoulders. She has no paresthesias. She does feel more pain with rotation and extension. She complains of "cracking and popping ". She has a cervical tremor. This is an essential tremor. No diagnosis of Parkinson's. She wonders if the tremor as well as causing her pain. She does have some numbness tingling at times in the hands which is intermittent. It's more in the radial digits. She has not had any electrodiagnostic testing. She has not had any cervical surgery. We completed facet joint blocks of the lower facet joints at C5-6 and C6-7 several years ago with good relief. She has not had any recent advanced imaging of the cervical spine. She's had physical therapy in the past but not recently. She does use a cervical pad while she reads and relaxes.    Review of Systems  Constitutional: Negative for chills, fever, malaise/fatigue and weight loss.  HENT: Negative for hearing  loss and sinus pain.   Eyes: Negative for blurred vision, double vision and photophobia.  Respiratory: Negative for cough and shortness of breath.   Cardiovascular: Negative for chest pain, palpitations and leg swelling.  Gastrointestinal: Negative for abdominal pain, nausea and vomiting.  Genitourinary: Negative for flank pain.  Musculoskeletal: Positive for back pain, joint pain and neck pain. Negative for myalgias.  Skin: Negative for itching and rash.  Neurological: Negative for tremors, focal weakness and weakness.  Endo/Heme/Allergies: Negative.   Psychiatric/Behavioral: Negative for depression.  All other systems reviewed and are negative.  Otherwise per HPI.  Assessment & Plan: Visit Diagnoses:  1. Cervicalgia   2. Cervical spondylosis without myelopathy   3. Myofascitis     Plan: Findings:  Chronic pain syndrome with chronic neck and back and joint pain. The patient with a lot of arthritis in general. She has pain really about the C7 spinous process which is at least in part myofascial. She has to pretty focal trigger points in the levator scapula and trapezius bilaterally reproduce a lot of her pain. She has a forward flexed cervical spine. We talked about activity modification and resting her head back when she is able to rest her is wanting to read or or watch TV. We've tried to explain to her daughter from a physical standpoint of trying to get the head back and resting on substance of these muscles have a time to rest some during the day and she  seems to understand this. We also talked about regrouping with a physical therapist but will hold off on that right now. I did complete trigger point injections of these areas and we'll see if that helps her. If that doesn't seem to help I would look at facet joint blocks because they did help quite a bit of the time. She doesn't really have any red flag symptoms to warrant cervical MRI although with the tingling in the hands it might be  something we would look at eventually. Another avenue would be electrodiagnostic study of the hands to look at carpal tunnel syndrome but I'm not sure at this point she is complaining about enough to warrant that either but we'll keep that in mind. I wouldn't change any medications at this point she seems to be doing okay there except for this neck pain. I spent more than 25 minutes speaking face-to-face with the patient with 50% of the time in counseling.    Meds & Orders: No orders of the defined types were placed in this encounter.   Orders Placed This Encounter  Procedures  . Trigger Point Injection    Follow-up: Return if symptoms worsen or fail to improve.   Procedures: Trigger Point Inj Date/Time: 06/10/2016 1:48 PM Performed by: Magnus Sinning Authorized by: Magnus Sinning   Consent Given by:  Patient Site marked: the procedure site was marked   Timeout: prior to procedure the correct patient, procedure, and site was verified   Total # of Trigger Points:  2 Location: neck   Needle Size:  25 G Approach:  Dorsal Medications #1:  3 mL lidocaine 1 %; 40 mg triamcinolone acetonide 40 MG/ML Comments: Trapezius and levator scap.    No notes on file   Clinical History: No specialty comments available.  She reports that she has never smoked. She has never used smokeless tobacco. No results for input(s): HGBA1C, LABURIC in the last 8760 hours.  Objective:  VS:  HT:    WT:   BMI:     BP:131/69  HR:74bpm  TEMP: ( )  RESP:  Physical Exam  Constitutional: She is oriented to person, place, and time. She appears well-developed and well-nourished.  Eyes: Conjunctivae and EOM are normal. Pupils are equal, round, and reactive to light.  Cardiovascular: Normal rate and intact distal pulses.   Pulmonary/Chest: Effort normal.  Musculoskeletal:  Patient sits with forward flexed cervical spine. She has pain to palpation with palpable trigger points in the levator scapula and  trapezius bilaterally do reproduce a lot of her pain. She does have limited range of motion with extension rotation. She has a negative Spurling's test. She has a negative Hoffmann's test. She is quite a bit of arthritis in the hands bilaterally and this is osteoarthritis changes. There is no synovitis. She has good strength in the upper extremities bilaterally.  Neurological: She is alert and oriented to person, place, and time. No sensory deficit. She exhibits normal muscle tone. Coordination normal.  Head tremor  Skin: Skin is warm and dry. No rash noted. No erythema.  Psychiatric: She has a normal mood and affect. Her behavior is normal.  Nursing note and vitals reviewed.   Ortho Exam Imaging: No results found.  Past Medical/Family/Surgical/Social History: Medications & Allergies reviewed per EMR Patient Active Problem List   Diagnosis Date Noted  . Chronic insomnia 05/11/2015  . Vitamin D deficiency 05/17/2014  . Counseling regarding end of life decision making 05/03/2014  . Mild dementia 05/03/2014  .  Lumbago 05/11/2012  . Vitamin B 12 deficiency 05/11/2012  . Dysphagia, pharyngoesophageal phase 12/17/2010  . Esophageal reflux 12/17/2010  . Abdominal hernia 12/17/2010  . Anemia, iron deficiency 12/06/2010  . HYPERCHOLESTEROLEMIA, MILD 11/19/2008  . Venous (peripheral) insufficiency 11/14/2007  . Essential hypertension, benign 05/14/2007  . Arrhythmia 05/14/2007  . CONSTIPATION, CHRONIC 05/14/2007  . Osteoporosis 05/14/2007  . Benign essential tremor 05/14/2007  . HIATAL HERNIA 12/18/2006  . DEGENERATIVE JOINT DISEASE, GENERALIZED 12/18/2006   Past Medical History:  Diagnosis Date  . Abnormal involuntary movements(781.0)   . Anemia   . Anxiety state, unspecified   . Blood transfusion   . Cardiac dysrhythmia, unspecified   . Chronic constipation   . DJD (degenerative joint disease)   . Esophageal stricture   . Generalized osteoarthrosis, unspecified site   . GERD  (gastroesophageal reflux disease)   . Hiatal hernia   . Hypertension   . Osteoporosis   . Pelvic adhesions   . Postgastrectomy syndrome   . Pure hypercholesterolemia   . Tortuous colon   . Urinary tract infection, site not specified   . Venous insufficiency   . Vitamin B12 deficiency    Family History  Problem Relation Age of Onset  . Cancer Mother     unknown type, most likely colon  . Heart disease Brother   . Arthritis Father   . Heart disease Sister    Past Surgical History:  Procedure Laterality Date  . CHOLECYSTECTOMY    . COLONOSCOPY    . ESOPHAGUS SURGERY  2003  . NISSEN FUNDOPLICATION    . TOTAL ABDOMINAL HYSTERECTOMY     DUB,   . UPPER GASTROINTESTINAL ENDOSCOPY     Social History   Occupational History  . Retired    Social History Main Topics  . Smoking status: Never Smoker  . Smokeless tobacco: Never Used  . Alcohol use No  . Drug use: No  . Sexual activity: No

## 2016-06-10 NOTE — Progress Notes (Deleted)
Patient is complaining of neck pain. Pretty constant. Denies pain into shoulders or arms. Hurts to move head side to side and up and down. Daughter says she complains of cracking and popping when turning head

## 2016-06-11 MED ORDER — TRIAMCINOLONE ACETONIDE 40 MG/ML IJ SUSP
40.0000 mg | INTRAMUSCULAR | Status: AC | PRN
  Administered 2016-06-10: 40 mg via INTRAMUSCULAR

## 2016-06-11 MED ORDER — LIDOCAINE HCL 1 % IJ SOLN
3.0000 mL | INTRAMUSCULAR | Status: AC | PRN
  Administered 2016-06-10: 3 mL

## 2016-06-18 ENCOUNTER — Telehealth: Payer: Self-pay

## 2016-06-18 MED ORDER — AMBULATORY NON FORMULARY MEDICATION: 6 refills | Status: DC

## 2016-06-18 NOTE — Telephone Encounter (Signed)
Faxed refill for GI cocktail

## 2016-06-19 ENCOUNTER — Other Ambulatory Visit: Payer: Self-pay

## 2016-06-19 NOTE — Telephone Encounter (Signed)
Refill request for Lidocaine VISC 2%. This medication has not been refilled by you before.

## 2016-08-07 DIAGNOSIS — H93293 Other abnormal auditory perceptions, bilateral: Secondary | ICD-10-CM | POA: Diagnosis not present

## 2016-08-07 DIAGNOSIS — H6123 Impacted cerumen, bilateral: Secondary | ICD-10-CM | POA: Diagnosis not present

## 2016-08-22 ENCOUNTER — Other Ambulatory Visit: Payer: Self-pay | Admitting: Family Medicine

## 2016-09-04 ENCOUNTER — Ambulatory Visit (INDEPENDENT_AMBULATORY_CARE_PROVIDER_SITE_OTHER): Payer: Medicare Other | Admitting: Adult Health

## 2016-09-04 ENCOUNTER — Encounter: Payer: Self-pay | Admitting: Adult Health

## 2016-09-04 VITALS — BP 121/68 | HR 56 | Ht 61.0 in | Wt 139.0 lb

## 2016-09-04 DIAGNOSIS — F5104 Psychophysiologic insomnia: Secondary | ICD-10-CM | POA: Diagnosis not present

## 2016-09-04 DIAGNOSIS — I499 Cardiac arrhythmia, unspecified: Secondary | ICD-10-CM

## 2016-09-04 DIAGNOSIS — D229 Melanocytic nevi, unspecified: Secondary | ICD-10-CM | POA: Insufficient documentation

## 2016-09-04 DIAGNOSIS — G43909 Migraine, unspecified, not intractable, without status migrainosus: Secondary | ICD-10-CM | POA: Insufficient documentation

## 2016-09-04 DIAGNOSIS — R51 Headache: Secondary | ICD-10-CM

## 2016-09-04 DIAGNOSIS — I10 Essential (primary) hypertension: Secondary | ICD-10-CM | POA: Diagnosis not present

## 2016-09-04 DIAGNOSIS — M79601 Pain in right arm: Secondary | ICD-10-CM | POA: Diagnosis not present

## 2016-09-04 DIAGNOSIS — G250 Essential tremor: Secondary | ICD-10-CM

## 2016-09-04 DIAGNOSIS — G43901 Migraine, unspecified, not intractable, with status migrainosus: Secondary | ICD-10-CM

## 2016-09-04 DIAGNOSIS — R519 Headache, unspecified: Secondary | ICD-10-CM | POA: Insufficient documentation

## 2016-09-04 MED ORDER — TRAMADOL HCL 50 MG PO TABS: 50.0000 mg | ORAL_TABLET | Freq: Three times a day (TID) | ORAL | 0 refills | Status: DC | PRN

## 2016-09-04 MED ORDER — BUTALBITAL-APAP-CAFFEINE 50-325-40 MG PO TABS: 1.0000 | ORAL_TABLET | Freq: Four times a day (QID) | ORAL | 0 refills | Status: DC | PRN

## 2016-09-04 NOTE — Assessment & Plan Note (Addendum)
Rx of Tramadol refilled. [name] tolerated med well in past and does not consume EOTH. [place] Controlled Substance Database verified-no contraindications noted.

## 2016-09-04 NOTE — Progress Notes (Signed)
Subjective:    Patient ID: [name], female    DOB: Jul 02, 1923, 83 y.o.   MRN: [id number]  HPI:  [name] presents for f/u:  HTN, OA, Benign essential tremor, chronic insomnia, and mild dementia.  Current complaints today: RUE pain and numbness r/t pulling weeds 4-6 weeks ago.  She also "stripped bed and washed linens yesterday" which further aggravated RUE sx's.  She is unable to rate pain, however mentions "soreness" repeatedly.  She has taken Tramadol in the past without SE and her daughter (at [facility]) reports that her OA seemed better controlled when she was using the medication.  She reports using heating pad on neck and RUE with some sx relief as well.  April 2018- she underwent cervical neck trigger point injections with Dr. Ernestina Patches and feels that procedure has eased neck pain, however she stated "my back is just eaten way".   She continues to drive, however ONLY to local grocery store, hair salon, and church services.  She has only gotten lost once and has driving test this Oct with DM[employer]. Her daughter presented laundry list of concerns today: 1) multiple large moles on neck/chest-derm referral placed. 2) one high BP reading at Optometrist- today BP well within goal and review of home BP and OV BP recordings demonstrate consistent BP control. 3) [name] is sleeping well with OTC Melatonin, would like Trazadone d/c'd-done 4) [name] living at home and her general safety- pt lives in town home community with well known neighbors and her BR is on main floor.  She has motorized scooter to lift her to second floor if she needs to ascend to top level.  She only drives to 3, well known locations during the daytime. She denies any falls in last 3 decades.  She has strong local support system of family, friends, and church members.  Patient Care Team    Relationship Specialty Notifications Start End  Sparkman, Valetta Fuller D, NP PCP - General Family Medicine  03/13/16   Magnus Sinning, MD Consulting  Physician Physical Medicine and Rehabilitation  03/13/16   Bo Merino, MD Consulting Physician Rheumatology  03/13/16   Irene Shipper, MD Consulting Physician Gastroenterology  03/13/16   Webb Laws, [place] Referring Physician Optometry  03/13/16     Patient Active Problem List   Diagnosis Date Noted  . Right arm pain 09/04/2016  . Migraine 09/04/2016  . Numerous moles 09/04/2016  . Frequent headaches 09/04/2016  . Chronic insomnia 05/11/2015  . Vitamin D deficiency 05/17/2014  . Counseling regarding end of life decision making 05/03/2014  . Mild dementia 05/03/2014  . Lumbago 05/11/2012  . Vitamin B 12 deficiency 05/11/2012  . Dysphagia, pharyngoesophageal phase 12/17/2010  . Esophageal reflux 12/17/2010  . Abdominal hernia 12/17/2010  . Anemia, iron deficiency 12/06/2010  . HYPERCHOLESTEROLEMIA, MILD 11/19/2008  . Venous (peripheral) insufficiency 11/14/2007  . Essential hypertension, benign 05/14/2007  . Arrhythmia 05/14/2007  . CONSTIPATION, CHRONIC 05/14/2007  . Osteoporosis 05/14/2007  . Benign essential tremor 05/14/2007  . HIATAL HERNIA 12/18/2006  . DEGENERATIVE JOINT DISEASE, GENERALIZED 12/18/2006     Past Medical History:  Diagnosis Date  . Abnormal involuntary movements(781.0)   . Anemia   . Anxiety state, unspecified   . Blood transfusion   . Cardiac dysrhythmia, unspecified   . Chronic constipation   . DJD (degenerative joint disease)   . Esophageal stricture   . Generalized osteoarthrosis, unspecified site   . GERD (gastroesophageal reflux disease)   . Hiatal hernia   .  Hypertension   . Osteoporosis   . Pelvic adhesions   . Postgastrectomy syndrome   . Pure hypercholesterolemia   . Tortuous colon   . Urinary tract infection, site not specified   . Venous insufficiency   . Vitamin B12 deficiency      Past Surgical History:  Procedure Laterality Date  . CHOLECYSTECTOMY    . COLONOSCOPY    . ESOPHAGUS SURGERY  2003  . NISSEN  FUNDOPLICATION    . TOTAL ABDOMINAL HYSTERECTOMY     DUB,   . UPPER GASTROINTESTINAL ENDOSCOPY       Family History  Problem Relation Age of Onset  . Cancer Mother        unknown type, most likely colon  . Heart disease Brother   . Arthritis Father   . Heart disease Sister      History  Drug Use No     History  Alcohol Use No     History  Smoking Status  . Never Smoker  Smokeless Tobacco  . Never Used     Outpatient Encounter Prescriptions as of 09/04/2016  Medication Sig  . acetaminophen (TYLENOL) 500 MG tablet Take 1,000 mg by mouth as needed.   . AMBULATORY NON FORMULARY MEDICATION Medication Name: GI Cocktail with 20cc Maalox, 10cc Donnatal, 20cc Viscous Lidocaine 2%, take 30cc dose q8hr as needed for GI upset.  . Calcium-Vitamin D (CALTRATE 600 PLUS-VIT D PO) Take 1 tablet by mouth daily.  Marland Kitchen diltiazem (CARDIZEM CD) 180 MG 24 hr capsule TAKE 1 CAPSULE BY MOUTH EVERY DAY.  Marland Kitchen losartan-hydrochlorothiazide (HYZAAR) 50-12.5 MG tablet TAKE ONE TABLET BY MOUTH ONCE DAILY  . MELATONIN PO Take by mouth at bedtime.  . metoprolol succinate (TOPROL-XL) 50 MG 24 hr tablet TAKE 1/2 TABLET BY MOUTH EVERY DAY.  Marland Kitchen MISC NATURAL PRODUCTS PO Take 1 tablet by mouth daily. Ginger & Turmeric  . Multiple Vitamins-Minerals (PRESERVISION AREDS 2 PO) Take 2 capsules by mouth daily.    Marland Kitchen omeprazole (PRILOSEC) 20 MG capsule TAKE 1 CAPSULE BY MOUTH DAILY.  . pravastatin (PRAVACHOL) 40 MG tablet TAKE 1 TABLET BY MOUTH ONCE DAILY IN THE EVENING.  . butalbital-acetaminophen-caffeine (FIORICET, ESGIC) 50-325-40 MG tablet Take 1 tablet by mouth every 6 (six) hours as needed for headache.  . traMADol (ULTRAM) 50 MG tablet Take 1 tablet (50 mg total) by mouth every 8 (eight) hours as needed.  . [DISCONTINUED] butalbital-acetaminophen-caffeine (FIORICET, ESGIC) 50-325-40 MG per tablet Take 1 tablet by mouth every 6 (six) hours as needed for headache.  . [DISCONTINUED] Cyanocobalamin 5000 MCG TBDP  Take 1 tablet by mouth daily.  . [DISCONTINUED] traMADol (ULTRAM) 50 MG tablet TAKE 1 TABLET BY MOUTH 2 TIMES A DAY AS NEEDED. (Patient not taking: Reported on 06/10/2016)  . [DISCONTINUED] traZODone (DESYREL) 50 MG tablet TAKE 1/2 TO 1 TABLET BY MOUTH AT BEDTIME.   No facility-administered encounter medications on file as of 09/04/2016.     Allergies: Patient has no known allergies.  Body mass index is 26.26 kg/m.  Blood pressure 121/68, pulse (!) 56, height 5\' 1"  (1.549 m), weight 139 lb (63 kg).     Review of Systems  Constitutional: Positive for activity change and fatigue. Negative for appetite change, chills, diaphoresis, fever and unexpected weight change.  Eyes: Positive for visual disturbance.  Respiratory: Negative for cough, chest tightness, shortness of breath and stridor.   Cardiovascular: Negative for chest pain, palpitations and leg swelling.  Gastrointestinal: Negative for abdominal distention, abdominal pain, blood in  stool, constipation, diarrhea, nausea and vomiting.  Endocrine: Negative for cold intolerance, heat intolerance, polydipsia, polyphagia and polyuria.  Genitourinary: Negative for difficulty urinating, flank pain and hematuria.  Musculoskeletal: Positive for arthralgias, back pain, gait problem, joint swelling, myalgias, neck pain and neck stiffness.  Skin: Negative for color change, pallor, rash and wound.  Neurological: Positive for tremors, weakness and headaches. Negative for dizziness, speech difficulty and light-headedness.  Hematological: Does not bruise/bleed easily.  Psychiatric/Behavioral: Positive for confusion. Negative for agitation, behavioral problems, decreased concentration, dysphoric mood, hallucinations, self-injury, sleep disturbance and suicidal ideas. The patient is not nervous/anxious and is not hyperactive.        Objective:   Physical Exam  Constitutional: She is oriented to person, place, and time. She appears well-developed  and well-nourished. No distress.  HENT:  Head: Normocephalic and atraumatic.  Right Ear: External ear normal. Decreased hearing is noted.  Left Ear: External ear normal. Decreased hearing is noted.  Bil hearing aids  Eyes: Pupils are equal, round, and reactive to light. Conjunctivae are normal.  Neck: Normal range of motion. Neck supple.  Cardiovascular: Normal rate, regular rhythm, normal heart sounds and intact distal pulses.   Pulmonary/Chest: Effort normal and breath sounds normal. No respiratory distress. She has no wheezes. She has no rales. She exhibits no tenderness.  Musculoskeletal: She exhibits tenderness. She exhibits no edema.       Cervical back: She exhibits decreased range of motion and tenderness.       Right forearm: She exhibits tenderness. She exhibits no bony tenderness, no swelling, no edema and no deformity.  Neurological: She is alert and oriented to person, place, and time. She displays tremor.  Constant, fine tremor of head  Skin: Skin is warm and dry. No rash noted. She is not diaphoretic. No erythema. No pallor.  Psychiatric: She has a normal mood and affect. Her speech is normal and behavior is normal. Judgment and thought content normal. She exhibits abnormal recent memory.  Nursing note and vitals reviewed.         Assessment & Plan:   1. Numerous moles   2. Frequent headaches   3. Chronic insomnia   4. Essential hypertension, benign   5. Right arm pain   6. Cardiac arrhythmia, unspecified cardiac arrhythmia type   7. Benign essential tremor   8. Migraine with status migrainosus, not intractable, unspecified migraine type     Chronic insomnia D/c'd Trazadone, continue OTC Melatonin instead.  Essential hypertension, benign BP at goal today 121/68 Continue Toprol 50mg  daily, Hyzaar 50/12.5mg  daily, Diltiazem 180mg  daily.   Arrhythmia Denies cardiac sx's, reports negative stress test in 1990s. Has not been seen by cards in decades.  Benign  essential tremor Stable, no dx of Parkinson's of family hx of neurological disorder.  Right arm pain Rx of Tramadol refilled. [name] tolerated med well in past and does not consume EOTH. [place] Controlled Substance Database verified-no contraindications noted.   Migraine RF of Fioricet provided. Encouraged to stay well hydrated and avoid HA triggers.  Numerous moles Dermatology referral    FOLLOW-UP:  Return in about 3 months (around 12/05/2016) for Regular Follow Up, HTN, Insomnia, Hypercholestermia, Lab Work, CPE.

## 2016-09-04 NOTE — Assessment & Plan Note (Signed)
Dermatology referral 

## 2016-09-04 NOTE — Patient Instructions (Addendum)
Arthritis Arthritis means joint pain. It can also mean joint disease. A joint is a place where bones come together. People who have arthritis may have:  Red joints.  Swollen joints.  Stiff joints.  Warm joints.  A fever.  A feeling of being sick.  Follow these instructions at home: Pay attention to any changes in your symptoms. Take these actions to help with your pain and swelling. Medicines  Take over-the-counter and prescription medicines only as told by your doctor.  Do not take aspirin for pain if your doctor says that you may have gout. Activity  Rest your joint if your doctor tells you to.  Avoid activities that make the pain worse.  Exercise your joint regularly as told by your doctor. Try doing exercises like: ? Swimming. ? Water aerobics. ? Biking. ? Walking. Joint Care   If your joint is swollen, keep it raised (elevated) if told by your doctor.  If your joint feels stiff in the morning, try taking a warm shower.  If you have diabetes, do not apply heat without asking your doctor.  If told, apply heat to the joint: ? Put a towel between the joint and the hot pack or heating pad. ? Leave the heat on the area for 20-30 minutes.  If told, apply ice to the joint: ? Put ice in a plastic bag. ? Place a towel between your skin and the bag. ? Leave the ice on for 20 minutes, 2-3 times per day.  Keep all follow-up visits as told by your doctor. Contact a doctor if:  The pain gets worse.  You have a fever. Get help right away if:  You have very bad pain in your joint.  You have swelling in your joint.  Your joint is red.  Many joints become painful and swollen.  You have very bad back pain.  Your leg is very weak.  You cannot control your pee (urine) or poop (stool). This information is not intended to replace advice given to you by your health care provider. Make sure you discuss any questions you have with your health care provider. Document  Released: 05/01/2009 Document Revised: 07/13/2015 Document Reviewed: 05/02/2014 Elsevier Interactive Patient Education  2018 [place] Many factors influence your heart health, including eating and exercise habits. Heart (coronary) risk increases with abnormal blood fat (lipid) levels. Heart-healthy meal planning includes limiting unhealthy fats, increasing healthy fats, and making other small dietary changes. This includes maintaining a healthy body weight to help keep lipid levels within a normal range. What is my plan? Your health care provider recommends that you:  Get no more than _________% of the total calories in your daily diet from fat.  Limit your intake of saturated fat to less than _________% of your total calories each day.  Limit the amount of cholesterol in your diet to less than _________ mg per day.  What types of fat should I choose?  Choose healthy fats more often. Choose monounsaturated and polyunsaturated fats, such as olive oil and canola oil, flaxseeds, walnuts, almonds, and seeds.  Eat more omega-3 fats. Good choices include salmon, mackerel, sardines, tuna, flaxseed oil, and ground flaxseeds. Aim to eat fish at least two times each week.  Limit saturated fats. Saturated fats are primarily found in animal products, such as meats, butter, and cream. Plant sources of saturated fats include palm oil, palm kernel oil, and coconut oil.  Avoid foods with partially hydrogenated oils in them.  These contain trans fats. Examples of foods that contain trans fats are stick margarine, some tub margarines, cookies, crackers, and other baked goods. What general guidelines do I need to follow?  Check food labels carefully to identify foods with trans fats or high amounts of saturated fat.  Fill one half of your plate with vegetables and green salads. Eat 4-5 servings of vegetables per day. A serving of vegetables equals 1 cup of raw leafy  vegetables,  cup of raw or cooked cut-up vegetables, or  cup of vegetable juice.  Fill one fourth of your plate with whole grains. Look for the word "whole" as the first word in the ingredient list.  Fill one fourth of your plate with lean protein foods.  Eat 4-5 servings of fruit per day. A serving of fruit equals one medium whole fruit,  cup of dried fruit,  cup of fresh, frozen, or canned fruit, or  cup of 100% fruit juice.  Eat more foods that contain soluble fiber. Examples of foods that contain this type of fiber are apples, broccoli, carrots, beans, peas, and barley. Aim to get 20-30 g of fiber per day.  Eat more home-cooked food and less restaurant, buffet, and fast food.  Limit or avoid alcohol.  Limit foods that are high in starch and sugar.  Avoid fried foods.  Cook foods by using methods other than frying. Baking, boiling, grilling, and broiling are all great options. Other fat-reducing suggestions include: ? Removing the skin from poultry. ? Removing all visible fats from meats. ? Skimming the fat off of stews, soups, and gravies before serving them. ? Steaming vegetables in water or broth.  Lose weight if you are overweight. Losing just 5-10% of your initial body weight can help your overall health and prevent diseases such as diabetes and heart disease.  Increase your consumption of nuts, legumes, and seeds to 4-5 servings per week. One serving of dried beans or legumes equals  cup after being cooked, one serving of nuts equals 1 ounces, and one serving of seeds equals  ounce or 1 tablespoon.  You may need to monitor your salt (sodium) intake, especially if you have high blood pressure. Talk with your health care provider or dietitian to get more information about reducing sodium. What foods can I eat? Grains  Breads, including [place], white, pita, wheat, raisin, rye, oatmeal, and [place]. Tortillas that are neither fried nor made with lard or trans fat. Low-fat  rolls, including hotdog and hamburger buns and English muffins. Biscuits. Muffins. Waffles. Pancakes. Light popcorn. Whole-grain cereals. Flatbread. Melba toast. Pretzels. Breadsticks. Rusks. Low-fat snacks and crackers, including oyster, saltine, matzo, graham, animal, and rye. Rice and pasta, including brown rice and those that are made with whole wheat. Vegetables All vegetables. Fruits All fruits, but limit coconut. Meats and Other Protein Sources Lean, well-trimmed beef, veal, pork, and lamb. Chicken and [place] without skin. All fish and shellfish. Wild duck, rabbit, pheasant, and venison. Egg whites or low-cholesterol egg substitutes. Dried beans, peas, lentils, and tofu.Seeds and most nuts. Dairy Low-fat or nonfat cheeses, including ricotta, string, and mozzarella. Skim or 1% milk that is liquid, powdered, or evaporated. Buttermilk that is made with low-fat milk. Nonfat or low-fat yogurt. Beverages Mineral water. Diet carbonated beverages. Sweets and Desserts Sherbets and fruit ices. Honey, jam, marmalade, jelly, and syrups. Meringues and gelatins. Pure sugar candy, such as hard candy, jelly beans, gumdrops, mints, marshmallows, and small amounts of dark chocolate. [employer] Eat all sweets and  desserts in moderation. Fats and Oils Nonhydrogenated (trans-free) margarines. Vegetable oils, including soybean, sesame, sunflower, olive, peanut, safflower, corn, canola, and cottonseed. Salad dressings or mayonnaise that are made with a vegetable oil. Limit added fats and oils that you use for cooking, baking, salads, and as spreads. Other Cocoa powder. Coffee and tea. All seasonings and condiments. The items listed above may not be a complete list of recommended foods or beverages. Contact your dietitian for more options. What foods are not recommended? Grains Breads that are made with saturated or trans fats, oils, or whole milk. Croissants. Butter rolls. Cheese breads. Sweet rolls.  Donuts. Buttered popcorn. Chow mein noodles. High-fat crackers, such as cheese or butter crackers. Meats and Other Protein Sources Fatty meats, such as hotdogs, short ribs, sausage, spareribs, bacon, ribeye roast or steak, and mutton. High-fat deli meats, such as salami and bologna. Caviar. Domestic duck and goose. Organ meats, such as kidney, liver, sweetbreads, brains, gizzard, chitterlings, and heart. Dairy Cream, sour cream, cream cheese, and creamed cottage cheese. Whole milk cheeses, including blue (bleu), Monte[place] Jack, [place], [place], American, [place], [place], [place], [place], and [place]. Whole or 2% milk that is liquid, evaporated, or condensed. Whole buttermilk. Cream sauce or high-fat cheese sauce. Yogurt that is made from whole milk. Beverages Regular sodas and drinks with added sugar. Sweets and Desserts Frosting. Pudding. Cookies. Cakes other than angel food cake. Candy that has milk chocolate or white chocolate, hydrogenated fat, butter, coconut, or unknown ingredients. Buttered syrups. Full-fat ice cream or ice cream drinks. Fats and Oils Gravy that has suet, meat fat, or shortening. Cocoa butter, hydrogenated oils, palm oil, coconut oil, palm kernel oil. These can often be found in baked products, candy, fried foods, nondairy creamers, and whipped toppings. Solid fats and shortenings, including bacon fat, salt pork, lard, and butter. Nondairy cream substitutes, such as coffee creamers and sour cream substitutes. Salad dressings that are made of unknown oils, cheese, or sour cream. The items listed above may not be a complete list of foods and beverages to avoid. Contact your dietitian for more information. This information is not intended to replace advice given to you by your health care provider. Make sure you discuss any questions you have with your health care provider. Document Released: 11/14/2007 Document Revised: 08/25/2015 Document Reviewed: 07/29/2013 Elsevier Interactive  Patient Education  2017 [place] all medications as directed. Recommend only light house work and avoiding strenuous yard work (ie. Pulling weeds). Continue use of heating pad and as needed Tramadol for pain. Dermatology referral placed. Please schedule complete physical with labs in 3 months. GREAT TO SEE YOU!

## 2016-09-04 NOTE — Assessment & Plan Note (Signed)
Denies cardiac sx's, reports negative stress test in 1990s. Has not been seen by cards in decades.

## 2016-09-04 NOTE — Assessment & Plan Note (Signed)
BP at goal today 121/68 Continue Toprol 50mg  daily, Hyzaar 50/12.5mg  daily, Diltiazem 180mg  daily.

## 2016-09-04 NOTE — Assessment & Plan Note (Signed)
Stable, no dx of Parkinson's of family hx of neurological disorder.

## 2016-09-04 NOTE — Assessment & Plan Note (Signed)
RF of Fioricet provided. Encouraged to stay well hydrated and avoid HA triggers.

## 2016-09-04 NOTE — Assessment & Plan Note (Signed)
D/c'd Trazadone, continue OTC Melatonin instead.

## 2016-09-06 ENCOUNTER — Other Ambulatory Visit: Payer: Self-pay | Admitting: Adult Health

## 2016-09-10 ENCOUNTER — Ambulatory Visit: Payer: Medicare Other | Admitting: Adult Health

## 2016-10-03 ENCOUNTER — Ambulatory Visit: Payer: Medicare Other

## 2016-10-03 ENCOUNTER — Ambulatory Visit (INDEPENDENT_AMBULATORY_CARE_PROVIDER_SITE_OTHER): Payer: Medicare Other | Admitting: Adult Health

## 2016-10-03 ENCOUNTER — Encounter: Payer: Self-pay | Admitting: Adult Health

## 2016-10-03 VITALS — BP 135/72 | HR 56 | Ht 61.0 in | Wt 142.6 lb

## 2016-10-03 DIAGNOSIS — R600 Localized edema: Secondary | ICD-10-CM | POA: Insufficient documentation

## 2016-10-03 DIAGNOSIS — R0609 Other forms of dyspnea: Principal | ICD-10-CM

## 2016-10-03 DIAGNOSIS — I10 Essential (primary) hypertension: Secondary | ICD-10-CM | POA: Diagnosis not present

## 2016-10-03 DIAGNOSIS — R0600 Dyspnea, unspecified: Secondary | ICD-10-CM | POA: Diagnosis not present

## 2016-10-03 MED ORDER — HYDROCHLOROTHIAZIDE 12.5 MG PO TABS: 12.5000 mg | ORAL_TABLET | Freq: Every day | ORAL | 0 refills | Status: DC

## 2016-10-03 NOTE — Assessment & Plan Note (Addendum)
BP at goal 135/72, HR 56

## 2016-10-03 NOTE — Patient Instructions (Addendum)
DASH Eating Plan DASH stands for "Dietary Approaches to Stop Hypertension." The DASH eating plan is a healthy eating plan that has been shown to reduce high blood pressure (hypertension). It may also reduce your risk for type 2 diabetes, heart disease, and stroke. The DASH eating plan may also help with weight loss. What are tips for following this plan? General guidelines  Avoid eating more than 2,300 mg (milligrams) of salt (sodium) a day. If you have hypertension, you may need to reduce your sodium intake to 1,500 mg a day.  Limit alcohol intake to no more than 1 drink a day for nonpregnant women and 2 drinks a day for men. One drink equals 12 oz of beer, 5 oz of wine, or 1 oz of hard liquor.  Work with your health care provider to maintain a healthy body weight or to lose weight. Ask what an ideal weight is for you.  Get at least 30 minutes of exercise that causes your heart to beat faster (aerobic exercise) most days of the week. Activities may include walking, swimming, or biking.  Work with your health care provider or diet and nutrition specialist (dietitian) to adjust your eating plan to your individual calorie needs. Reading food labels  Check food labels for the amount of sodium per serving. Choose foods with less than 5 percent of the Daily Value of sodium. Generally, foods with less than 300 mg of sodium per serving fit into this eating plan.  To find whole grains, look for the word "whole" as the first word in the ingredient list. Shopping  Buy products labeled as "low-sodium" or "no salt added."  Buy fresh foods. Avoid canned foods and premade or frozen meals. Cooking  Avoid adding salt when cooking. Use salt-free seasonings or herbs instead of table salt or sea salt. Check with your health care provider or pharmacist before using salt substitutes.  Do not fry foods. Cook foods using healthy methods such as baking, boiling, grilling, and broiling instead.  Cook with  heart-healthy oils, such as olive, canola, soybean, or sunflower oil. Meal planning   Eat a balanced diet that includes: ? 5 or more servings of fruits and vegetables each day. At each meal, try to fill half of your plate with fruits and vegetables. ? Up to 6-8 servings of whole grains each day. ? Less than 6 oz of lean meat, poultry, or fish each day. A 3-oz serving of meat is about the same size as a deck of cards. One egg equals 1 oz. ? 2 servings of low-fat dairy each day. ? A serving of nuts, seeds, or beans 5 times each week. ? Heart-healthy fats. Healthy fats called Omega-3 fatty acids are found in foods such as flaxseeds and coldwater fish, like sardines, salmon, and mackerel.  Limit how much you eat of the following: ? Canned or prepackaged foods. ? Food that is high in trans fat, such as fried foods. ? Food that is high in saturated fat, such as fatty meat. ? Sweets, desserts, sugary drinks, and other foods with added sugar. ? Full-fat dairy products.  Do not salt foods before eating.  Try to eat at least 2 vegetarian meals each week.  Eat more home-cooked food and less restaurant, buffet, and fast food.  When eating at a restaurant, ask that your food be prepared with less salt or no salt, if possible. What foods are recommended? The items listed may not be a complete list. Talk with your dietitian about what   dietary choices are best for you. Grains Whole-grain or whole-wheat bread. Whole-grain or whole-wheat pasta. Brown rice. Oatmeal. Quinoa. Bulgur. Whole-grain and low-sodium cereals. Pita bread. Low-fat, low-sodium crackers. Whole-wheat flour tortillas. Vegetables Fresh or frozen vegetables (raw, steamed, roasted, or grilled). Low-sodium or reduced-sodium tomato and vegetable juice. Low-sodium or reduced-sodium tomato sauce and tomato paste. Low-sodium or reduced-sodium canned vegetables. Fruits All fresh, dried, or frozen fruit. Canned fruit in natural juice (without  added sugar). Meat and other protein foods Skinless chicken or turkey. Ground chicken or turkey. Pork with fat trimmed off. Fish and seafood. Egg whites. Dried beans, peas, or lentils. Unsalted nuts, nut butters, and seeds. Unsalted canned beans. Lean cuts of beef with fat trimmed off. Low-sodium, lean deli meat. Dairy Low-fat (1%) or fat-free (skim) milk. Fat-free, low-fat, or reduced-fat cheeses. Nonfat, low-sodium ricotta or cottage cheese. Low-fat or nonfat yogurt. Low-fat, low-sodium cheese. Fats and oils Soft margarine without trans fats. Vegetable oil. Low-fat, reduced-fat, or light mayonnaise and salad dressings (reduced-sodium). Canola, safflower, olive, soybean, and sunflower oils. Avocado. Seasoning and other foods Herbs. Spices. Seasoning mixes without salt. Unsalted popcorn and pretzels. Fat-free sweets. What foods are not recommended? The items listed may not be a complete list. Talk with your dietitian about what dietary choices are best for you. Grains Baked goods made with fat, such as croissants, muffins, or some breads. Dry pasta or rice meal packs. Vegetables Creamed or fried vegetables. Vegetables in a cheese sauce. Regular canned vegetables (not low-sodium or reduced-sodium). Regular canned tomato sauce and paste (not low-sodium or reduced-sodium). Regular tomato and vegetable juice (not low-sodium or reduced-sodium). Pickles. Olives. Fruits Canned fruit in a light or heavy syrup. Fried fruit. Fruit in cream or butter sauce. Meat and other protein foods Fatty cuts of meat. Ribs. Fried meat. Bacon. Sausage. Bologna and other processed lunch meats. Salami. Fatback. Hotdogs. Bratwurst. Salted nuts and seeds. Canned beans with added salt. Canned or smoked fish. Whole eggs or egg yolks. Chicken or turkey with skin. Dairy Whole or 2% milk, cream, and half-and-half. Whole or full-fat cream cheese. Whole-fat or sweetened yogurt. Full-fat cheese. Nondairy creamers. Whipped toppings.  Processed cheese and cheese spreads. Fats and oils Butter. Stick margarine. Lard. Shortening. Ghee. Bacon fat. Tropical oils, such as coconut, palm kernel, or palm oil. Seasoning and other foods Salted popcorn and pretzels. Onion salt, garlic salt, seasoned salt, table salt, and sea salt. Worcestershire sauce. Tartar sauce. Barbecue sauce. Teriyaki sauce. Soy sauce, including reduced-sodium. Steak sauce. Canned and packaged gravies. Fish sauce. Oyster sauce. Cocktail sauce. Horseradish that you find on the shelf. Ketchup. Mustard. Meat flavorings and tenderizers. Bouillon cubes. Hot sauce and Tabasco sauce. Premade or packaged marinades. Premade or packaged taco seasonings. Relishes. Regular salad dressings. Where to find more information:  National Heart, Lung, and Blood Institute: www.nhlbi.nih.gov  American Heart Association: www.heart.org Summary  The DASH eating plan is a healthy eating plan that has been shown to reduce high blood pressure (hypertension). It may also reduce your risk for type 2 diabetes, heart disease, and stroke.  With the DASH eating plan, you should limit salt (sodium) intake to 2,300 mg a day. If you have hypertension, you may need to reduce your sodium intake to 1,500 mg a day.  When on the DASH eating plan, aim to eat more fresh fruits and vegetables, whole grains, lean proteins, low-fat dairy, and heart-healthy fats.  Work with your health care provider or diet and nutrition specialist (dietitian) to adjust your eating plan to your individual   calorie needs. This information is not intended to replace advice given to you by your health care provider. Make sure you discuss any questions you have with your health care provider. Document Released: 01/24/2011 Document Revised: 01/29/2016 Document Reviewed: 01/29/2016 Elsevier Interactive Patient Education  2017 [employer].   We will call you when labs and CXR results are available. Continue all medications as  directed. Please take Hydrodiuril 12.5mg  daily for the next 7 days. Please weigh yourself each morning at the same time with the same clothes. Call clinic if you gain more than 3 lbs in 24 hr or 5 lbs in 6 days. Call clinic if swelling or shortness of breath worsen.

## 2016-10-03 NOTE — Progress Notes (Signed)
Subjective:    Patient ID: [name], female    DOB: 06-27-1923, 83 y.o.   MRN: [id number]  HPI:  [name] presents with bil ankle edema that flared up this weekend.  She denies increase in Na++, dyspnea at rest/CP/palpitations. She will have dyspnea when walking across a room, however according to her daughter this is not an acute change.  Cardiac Hx:  Arrhythmia, HT. She had negative stress test in 2000 with Dr. Olevia Perches.  Patient Care Team    Relationship Specialty Notifications Start End  Loyola, Valetta Fuller D, NP PCP - General Family Medicine  03/13/16   Magnus Sinning, MD Consulting Physician Physical Medicine and Rehabilitation  03/13/16   Bo Merino, MD Consulting Physician Rheumatology  03/13/16   Irene Shipper, MD Consulting Physician Gastroenterology  03/13/16   Webb Laws, [place] Referring Physician Optometry  03/13/16     Patient Active Problem List   Diagnosis Date Noted  . Lower extremity edema 10/03/2016  . Dyspnea on exertion 10/03/2016  . Right arm pain 09/04/2016  . Migraine 09/04/2016  . Numerous moles 09/04/2016  . Frequent headaches 09/04/2016  . Chronic insomnia 05/11/2015  . Vitamin D deficiency 05/17/2014  . Counseling regarding end of life decision making 05/03/2014  . Mild dementia 05/03/2014  . Lumbago 05/11/2012  . Vitamin B 12 deficiency 05/11/2012  . Dysphagia, pharyngoesophageal phase 12/17/2010  . Esophageal reflux 12/17/2010  . Abdominal hernia 12/17/2010  . Anemia, iron deficiency 12/06/2010  . HYPERCHOLESTEROLEMIA, MILD 11/19/2008  . Venous (peripheral) insufficiency 11/14/2007  . Essential hypertension, benign 05/14/2007  . Arrhythmia 05/14/2007  . CONSTIPATION, CHRONIC 05/14/2007  . Osteoporosis 05/14/2007  . Benign essential tremor 05/14/2007  . HIATAL HERNIA 12/18/2006  . DEGENERATIVE JOINT DISEASE, GENERALIZED 12/18/2006     Past Medical History:  Diagnosis Date  . Abnormal involuntary movements(781.0)   . Anemia   .  Anxiety state, unspecified   . Blood transfusion   . Cardiac dysrhythmia, unspecified   . Chronic constipation   . DJD (degenerative joint disease)   . Esophageal stricture   . Generalized osteoarthrosis, unspecified site   . GERD (gastroesophageal reflux disease)   . Hiatal hernia   . Hypertension   . Osteoporosis   . Pelvic adhesions   . Postgastrectomy syndrome   . Pure hypercholesterolemia   . Tortuous colon   . Urinary tract infection, site not specified   . Venous insufficiency   . Vitamin B12 deficiency      Past Surgical History:  Procedure Laterality Date  . CHOLECYSTECTOMY    . COLONOSCOPY    . ESOPHAGUS SURGERY  2003  . NISSEN FUNDOPLICATION    . TOTAL ABDOMINAL HYSTERECTOMY     DUB,   . UPPER GASTROINTESTINAL ENDOSCOPY       Family History  Problem Relation Age of Onset  . Cancer Mother        unknown type, most likely colon  . Heart disease Brother   . Arthritis Father   . Heart disease Sister      History  Drug Use No     History  Alcohol Use No     History  Smoking Status  . Never Smoker  Smokeless Tobacco  . Never Used     Outpatient Encounter Prescriptions as of 10/03/2016  Medication Sig  . acetaminophen (TYLENOL) 500 MG tablet Take 1,000 mg by mouth as needed.   . AMBULATORY NON FORMULARY MEDICATION Medication Name: GI Cocktail with 20cc Maalox, 10cc Donnatal,  20cc Viscous Lidocaine 2%, take 30cc dose q8hr as needed for GI upset.  . butalbital-acetaminophen-caffeine (FIORICET, ESGIC) 50-325-40 MG tablet Take 1 tablet by mouth every 6 (six) hours as needed for headache.  . Calcium-Vitamin D (CALTRATE 600 PLUS-VIT D PO) Take 1 tablet by mouth daily.  Marland Kitchen diltiazem (CARDIZEM CD) 180 MG 24 hr capsule TAKE 1 CAPSULE BY MOUTH EVERY DAY.  Marland Kitchen losartan-hydrochlorothiazide (HYZAAR) 50-12.5 MG tablet TAKE ONE TABLET BY MOUTH ONCE DAILY  . MELATONIN PO Take by mouth at bedtime.  . metoprolol succinate (TOPROL-XL) 50 MG 24 hr tablet TAKE 1/2  TABLET BY MOUTH EVERY DAY.  Marland Kitchen MISC NATURAL PRODUCTS PO Take 1 tablet by mouth daily. Ginger & Turmeric  . Multiple Vitamins-Minerals (PRESERVISION AREDS 2 PO) Take 2 capsules by mouth daily.    Marland Kitchen omeprazole (PRILOSEC) 20 MG capsule TAKE 1 CAPSULE BY MOUTH DAILY.  . pravastatin (PRAVACHOL) 40 MG tablet TAKE 1 TABLET BY MOUTH ONCE DAILY IN THE EVENING.  . traMADol (ULTRAM) 50 MG tablet Take 1 tablet (50 mg total) by mouth every 8 (eight) hours as needed.  . hydrochlorothiazide (HYDRODIURIL) 12.5 MG tablet Take 1 tablet (12.5 mg total) by mouth daily.   No facility-administered encounter medications on file as of 10/03/2016.     Allergies: Patient has no known allergies.  Body mass index is 26.94 kg/m.  Blood pressure 135/72, pulse (!) 56, height 5\' 1"  (1.549 m), weight 142 lb 9.6 oz (64.7 kg).     Review of Systems  Constitutional: Positive for fatigue. Negative for activity change, appetite change, chills, diaphoresis, fever and unexpected weight change.  Respiratory: Negative for cough, chest tightness, shortness of breath, wheezing and stridor.        Negative dyspnea at rest, however will have mild dyspnea with ambulation.  Cardiovascular: Positive for leg swelling. Negative for chest pain and palpitations.  Endocrine: Negative for cold intolerance, heat intolerance, polydipsia, polyphagia and polyuria.  Musculoskeletal: Positive for arthralgias, back pain, gait problem, joint swelling, myalgias, neck pain and neck stiffness.  Skin: Positive for pallor.  Neurological: Negative for dizziness, tremors, weakness and headaches.  Hematological: Does not bruise/bleed easily.  Psychiatric/Behavioral: Negative for sleep disturbance.       Objective:   Physical Exam  Constitutional: She is oriented to person, place, and time. She appears well-developed and well-nourished. No distress.  Cardiovascular: Normal rate, regular rhythm and intact distal pulses.   Pulmonary/Chest: Effort  normal and breath sounds normal. No respiratory distress. She has no wheezes. She has no rales. She exhibits no tenderness.  Musculoskeletal:       Right ankle: She exhibits swelling.       Left ankle: She exhibits swelling.  2+ bil ankle edema  Neurological: She is alert and oriented to person, place, and time.  Skin: Skin is warm and dry. No rash noted. She is not diaphoretic. No erythema. There is pallor.  Psychiatric: She has a normal mood and affect. Her behavior is normal. Judgment and thought content normal.          Assessment & Plan:   1. Lower extremity edema   2. Dyspnea on exertion   3. Essential hypertension, benign     Essential hypertension, benign BP at goal 135/72, HR 56   Lower extremity edema 2+ bil ankle edema 03/15/16:  Creat 0.87, GFR 59 Repeated CMP today. Continue all anti-hypertensives as directed with addition of HCTZ 12.5mg  daily for the next 7 days. Daily weights-call clinic if you experience >3  lb gain in 24 hrs or 5 lbs in 7 days. F/u in 1 week.  Dyspnea on exertion FINDINGS: The lungs are well-expanded. The interstitial markings are coarse. There is no alveolar infiltrate or pleural effusion. The cardiac silhouette is top-normal in size. The pulmonary vascularity is not engorged. There is tortuosity of the descending thoracic aorta and there is mural aortic calcification. There is prominent thoracic kyphosis without compression fracture.  IMPRESSION: Chronic bronchitic changes.  No alveolar pneumonia nor CHF.  Thoracic aortic atherosclerosis.    FOLLOW-UP:  Return in about 1 week (around 10/10/2016).

## 2016-10-03 NOTE — Assessment & Plan Note (Signed)
FINDINGS: The lungs are well-expanded. The interstitial markings are coarse. There is no alveolar infiltrate or pleural effusion. The cardiac silhouette is top-normal in size. The pulmonary vascularity is not engorged. There is tortuosity of the descending thoracic aorta and there is mural aortic calcification. There is prominent thoracic kyphosis without compression fracture.  IMPRESSION: Chronic bronchitic changes.  No alveolar pneumonia nor CHF.  Thoracic aortic atherosclerosis.

## 2016-10-03 NOTE — Assessment & Plan Note (Addendum)
2+ bil ankle edema 03/15/16:  Creat 0.87, GFR 59 Repeated CMP today. Continue all anti-hypertensives as directed with addition of HCTZ 12.5mg  daily for the next 7 days. Daily weights-call clinic if you experience >3 lb gain in 24 hrs or 5 lbs in 7 days. F/u in 1 week.

## 2016-10-03 NOTE — Progress Notes (Signed)
Afternoon Tonya, Can you please call Ms. Ghazarian and share that her CXR was negative for CHF. We can talk in detail about the CXR at next week's f/u. We will call her tomorrow when her labs result. Thanks! Valetta Fuller

## 2016-10-04 LAB — CMP14+CBC/D/PLT+TSH
ALBUMIN: 4.3 g/dL (ref 3.2–4.6)
ALT: 11 IU/L (ref 0–32)
AST: 22 IU/L (ref 0–40)
Albumin/Globulin Ratio: 1.7 (ref 1.2–2.2)
Alkaline Phosphatase: 83 IU/L (ref 39–117)
BASOS ABS: 0.1 10*3/uL (ref 0.0–0.2)
BUN/Creatinine Ratio: 14 (ref 12–28)
BUN: 14 mg/dL (ref 10–36)
Basos: 1 %
Bilirubin Total: 0.5 mg/dL (ref 0.0–1.2)
CALCIUM: 10.1 mg/dL (ref 8.7–10.3)
CO2: 26 mmol/L (ref 20–29)
CREATININE: 1 mg/dL (ref 0.57–1.00)
Chloride: 105 mmol/L (ref 96–106)
EOS (ABSOLUTE): 0.1 10*3/uL (ref 0.0–0.4)
Eos: 2 %
GFR calc Af Amer: 57 mL/min/{1.73_m2} — ABNORMAL LOW (ref >59)
GFR calc non Af Amer: 50 mL/min/{1.73_m2} — ABNORMAL LOW (ref >59)
GLOBULIN, TOTAL: 2.5 g/dL (ref 1.5–4.5)
GLUCOSE: 81 mg/dL (ref 65–99)
HEMATOCRIT: 36.3 % (ref 34.0–46.6)
Hemoglobin: 12.2 g/dL (ref 11.1–15.9)
IMMATURE GRANS (ABS): 0 10*3/uL (ref 0.0–0.1)
Immature Granulocytes: 0 %
LYMPHS: 35 %
Lymphocytes Absolute: 2.2 10*3/uL (ref 0.7–3.1)
MCH: 32.3 pg (ref 26.6–33.0)
MCHC: 33.6 g/dL (ref 31.5–35.7)
MCV: 96 fL (ref 79–97)
Monocytes Absolute: 0.6 10*3/uL (ref 0.1–0.9)
Monocytes: 9 %
NEUTROS ABS: 3.3 10*3/uL (ref 1.4–7.0)
NEUTROS PCT: 53 %
POTASSIUM: 5.3 mmol/L — AB (ref 3.5–5.2)
Platelets: 259 10*3/uL (ref 150–379)
RBC: 3.78 x10E6/uL (ref 3.77–5.28)
RDW: 12.8 % (ref 12.3–15.4)
Sodium: 146 mmol/L — ABNORMAL HIGH (ref 134–144)
TSH: 1.17 u[IU]/mL (ref 0.450–4.500)
Total Protein: 6.8 g/dL (ref 6.0–8.5)
WBC: 6.2 10*3/uL (ref 3.4–10.8)

## 2016-10-07 ENCOUNTER — Other Ambulatory Visit: Payer: Self-pay

## 2016-10-07 DIAGNOSIS — D492 Neoplasm of unspecified behavior of bone, soft tissue, and skin: Secondary | ICD-10-CM | POA: Diagnosis not present

## 2016-10-07 DIAGNOSIS — L820 Inflamed seborrheic keratosis: Secondary | ICD-10-CM | POA: Diagnosis not present

## 2016-10-07 DIAGNOSIS — D229 Melanocytic nevi, unspecified: Secondary | ICD-10-CM | POA: Diagnosis not present

## 2016-10-10 ENCOUNTER — Ambulatory Visit (INDEPENDENT_AMBULATORY_CARE_PROVIDER_SITE_OTHER): Payer: Medicare Other | Admitting: Adult Health

## 2016-10-10 ENCOUNTER — Encounter: Payer: Self-pay | Admitting: Adult Health

## 2016-10-10 VITALS — BP 122/68 | HR 67 | Ht 61.0 in | Wt 138.3 lb

## 2016-10-10 DIAGNOSIS — M542 Cervicalgia: Secondary | ICD-10-CM | POA: Insufficient documentation

## 2016-10-10 DIAGNOSIS — R600 Localized edema: Secondary | ICD-10-CM | POA: Diagnosis not present

## 2016-10-10 DIAGNOSIS — Z79899 Other long term (current) drug therapy: Secondary | ICD-10-CM

## 2016-10-10 DIAGNOSIS — I10 Essential (primary) hypertension: Secondary | ICD-10-CM

## 2016-10-10 NOTE — Assessment & Plan Note (Addendum)
BP at goal 122/68, HR 67. Continue Toprol 50mg  1/2 tab, Hyzaar 50/12.5mg , diltiazem 180mg  daily. Bil ankle/foot edema completely resolved. BMP re-checked-will call when results are available.

## 2016-10-10 NOTE — Assessment & Plan Note (Signed)
Heating pad and daily stretching as tolerated. Due to age and co-morbidities muscle relaxers are not prudent. Last cervical trigger point injection was April 2018. F/u with ortho as needed.

## 2016-10-10 NOTE — Patient Instructions (Addendum)
Hypertension Hypertension, commonly called high blood pressure, is when the force of blood pumping through the arteries is too strong. The arteries are the blood vessels that carry blood from the heart throughout the body. Hypertension forces the heart to work harder to pump blood and may cause arteries to become narrow or stiff. Having untreated or uncontrolled hypertension can cause heart attacks, strokes, kidney disease, and other problems. A blood pressure reading consists of a higher number over a lower number. Ideally, your blood pressure should be below 120/80. The first ("top") number is called the systolic pressure. It is a measure of the pressure in your arteries as your heart beats. The second ("bottom") number is called the diastolic pressure. It is a measure of the pressure in your arteries as the heart relaxes. What are the causes? The cause of this condition is not known. What increases the risk? Some risk factors for high blood pressure are under your control. Others are not. Factors you can change  Smoking.  Having type 2 diabetes mellitus, high cholesterol, or both.  Not getting enough exercise or physical activity.  Being overweight.  Having too much fat, sugar, calories, or salt (sodium) in your diet.  Drinking too much alcohol. Factors that are difficult or impossible to change  Having chronic kidney disease.  Having a family history of high blood pressure.  Age. Risk increases with age.  Race. You may be at higher risk if you are African-American.  Gender. Men are at higher risk than women before age 45. After age 65, women are at higher risk than men.  Having obstructive sleep apnea.  Stress. What are the signs or symptoms? Extremely high blood pressure (hypertensive crisis) may cause:  Headache.  Anxiety.  Shortness of breath.  Nosebleed.  Nausea and vomiting.  Severe chest pain.  Jerky movements you cannot control (seizures).  How is this  diagnosed? This condition is diagnosed by measuring your blood pressure while you are seated, with your arm resting on a surface. The cuff of the blood pressure monitor will be placed directly against the skin of your upper arm at the level of your heart. It should be measured at least twice using the same arm. Certain conditions can cause a difference in blood pressure between your right and left arms. Certain factors can cause blood pressure readings to be lower or higher than normal (elevated) for a short period of time:  When your blood pressure is higher when you are in a health care provider's office than when you are at home, this is called white coat hypertension. Most people with this condition do not need medicines.  When your blood pressure is higher at home than when you are in a health care provider's office, this is called masked hypertension. Most people with this condition may need medicines to control blood pressure.  If you have a high blood pressure reading during one visit or you have normal blood pressure with other risk factors:  You may be asked to return on a different day to have your blood pressure checked again.  You may be asked to monitor your blood pressure at home for 1 week or longer.  If you are diagnosed with hypertension, you may have other blood or imaging tests to help your health care provider understand your overall risk for other conditions. How is this treated? This condition is treated by making healthy lifestyle changes, such as eating healthy foods, exercising more, and reducing your alcohol intake. Your   health care provider may prescribe medicine if lifestyle changes are not enough to get your blood pressure under control, and if:  Your systolic blood pressure is above 130.  Your diastolic blood pressure is above 80.  Your personal target blood pressure may vary depending on your medical conditions, your age, and other factors. Follow these  instructions at home: Eating and drinking  Eat a diet that is high in fiber and potassium, and low in sodium, added sugar, and fat. An example eating plan is called the DASH (Dietary Approaches to Stop Hypertension) diet. To eat this way: ? Eat plenty of fresh fruits and vegetables. Try to fill half of your plate at each meal with fruits and vegetables. ? Eat whole grains, such as whole wheat pasta, brown rice, or whole grain bread. Fill about one quarter of your plate with whole grains. ? Eat or drink low-fat dairy products, such as skim milk or low-fat yogurt. ? Avoid fatty cuts of meat, processed or cured meats, and poultry with skin. Fill about one quarter of your plate with lean proteins, such as fish, chicken without skin, beans, eggs, and tofu. ? Avoid premade and processed foods. These tend to be higher in sodium, added sugar, and fat.  Reduce your daily sodium intake. Most people with hypertension should eat less than 1,500 mg of sodium a day.  Limit alcohol intake to no more than 1 drink a day for nonpregnant women and 2 drinks a day for men. One drink equals 12 oz of beer, 5 oz of wine, or 1 oz of hard liquor. Lifestyle  Work with your health care provider to maintain a healthy body weight or to lose weight. Ask what an ideal weight is for you.  Get at least 30 minutes of exercise that causes your heart to beat faster (aerobic exercise) most days of the week. Activities may include walking, swimming, or biking.  Include exercise to strengthen your muscles (resistance exercise), such as pilates or lifting weights, as part of your weekly exercise routine. Try to do these types of exercises for 30 minutes at least 3 days a week.  Do not use any products that contain nicotine or tobacco, such as cigarettes and e-cigarettes. If you need help quitting, ask your health care provider.  Monitor your blood pressure at home as told by your health care provider.  Keep all follow-up visits as  told by your health care provider. This is important. Medicines  Take over-the-counter and prescription medicines only as told by your health care provider. Follow directions carefully. Blood pressure medicines must be taken as prescribed.  Do not skip doses of blood pressure medicine. Doing this puts you at risk for problems and can make the medicine less effective.  Ask your health care provider about side effects or reactions to medicines that you should watch for. Contact a health care provider if:  You think you are having a reaction to a medicine you are taking.  You have headaches that keep coming back (recurring).  You feel dizzy.  You have swelling in your ankles.  You have trouble with your vision. Get help right away if:  You develop a severe headache or confusion.  You have unusual weakness or numbness.  You feel faint.  You have severe pain in your chest or abdomen.  You vomit repeatedly.  You have trouble breathing. Summary  Hypertension is when the force of blood pumping through your arteries is too strong. If this condition is not   controlled, it may put you at risk for serious complications.  Your personal target blood pressure may vary depending on your medical conditions, your age, and other factors. For most people, a normal blood pressure is less than 120/80.  Hypertension is treated with lifestyle changes, medicines, or a combination of both. Lifestyle changes include weight loss, eating a healthy, low-sodium diet, exercising more, and limiting alcohol. This information is not intended to replace advice given to you by your health care provider. Make sure you discuss any questions you have with your health care provider. Document Released: 02/04/2005 Document Revised: 01/03/2016 Document Reviewed: 01/03/2016 Elsevier Interactive Patient Education  2018 [place].    Neck Exercises Neck exercises can be important for many reasons:  They can help  you to improve and maintain flexibility in your neck. This can be especially important as you age.  They can help to make your neck stronger. This can make movement easier.  They can reduce or prevent neck pain.  They may help your upper back.  Ask your health care provider which neck exercises would be best for you. Exercises Neck Press Repeat this exercise 10 times. Do it first thing in the morning and right before bed or as told by your health care provider. 1. Lie on your back on a firm bed or on the floor with a pillow under your head. 2. Use your neck muscles to push your head down on the pillow and straighten your spine. 3. Hold the position as well as you can. Keep your head facing up and your chin tucked. 4. Slowly count to 5 while holding this position. 5. Relax for a few seconds. Then repeat.  Isometric Strengthening Do a full set of these exercises 2 times a day or as told by your health care provider. 1. Sit in a supportive chair and place your hand on your forehead. 2. Push forward with your head and neck while pushing back with your hand. Hold for 10 seconds. 3. Relax. Then repeat the exercise 3 times. 4. Next, do thesequence again, this time putting your hand against the back of your head. Use your head and neck to push backward against the hand pressure. 5. Finally, do the same exercise on either side of your head, pushing sideways against the pressure of your hand.  Prone Head Lifts Repeat this exercise 5 times. Do this 2 times a day or as told by your health care provider. 1. Lie face-down, resting on your elbows so that your chest and upper back are raised. 2. Start with your head facing downward, near your chest. Position your chin either on or near your chest. 3. Slowly lift your head upward. Lift until you are looking straight ahead. Then continue lifting your head as far back as you can stretch. 4. Hold your head up for 5 seconds. Then slowly lower it to your  starting position.  Supine Head Lifts Repeat this exercise 8-10 times. Do this 2 times a day or as told by your health care provider. 1. Lie on your back, bending your knees to point to the ceiling and keeping your feet flat on the floor. 2. Lift your head slowly off the floor, raising your chin toward your chest. 3. Hold for 5 seconds. 4. Relax and repeat.  Scapular Retraction Repeat this exercise 5 times. Do this 2 times a day or as told by your health care provider. 1. Stand with your arms at your sides. Look straight ahead. 2. Slowly  pull both shoulders backward and downward until you feel a stretch between your shoulder blades in your upper back. 3. Hold for 10-30 seconds. 4. Relax and repeat.  Contact a health care provider if:  Your neck pain or discomfort gets much worse when you do an exercise.  Your neck pain or discomfort does not improve within 2 hours after you exercise. If you have any of these problems, stop exercising right away. Do not do the exercises again unless your health care provider says that you can. Get help right away if:  You develop sudden, severe neck pain. If this happens, stop exercising right away. Do not do the exercises again unless your health care provider says that you can. Exercises Neck Stretch  Repeat this exercise 3-5 times. 1. Do this exercise while standing or while sitting in a chair. 2. Place your feet flat on the floor, shoulder-width apart. 3. Slowly turn your head to the right. Turn it all the way to the right so you can look over your right shoulder. Do not tilt or tip your head. 4. Hold this position for 10-30 seconds. 5. Slowly turn your head to the left, to look over your left shoulder. 6. Hold this position for 10-30 seconds.  Neck Retraction Repeat this exercise 8-10 times. Do this 3-4 times a day or as told by your health care provider. 1. Do this exercise while standing or while sitting in a sturdy chair. 2. Look  straight ahead. Do not bend your neck. 3. Use your fingers to push your chin backward. Do not bend your neck for this movement. Continue to face straight ahead. If you are doing the exercise properly, you will feel a slight sensation in your throat and a stretch at the back of your neck. 4. Hold the stretch for 1-2 seconds. Relax and repeat.  This information is not intended to replace advice given to you by your health care provider. Make sure you discuss any questions you have with your health care provider. Document Released: 01/16/2015 Document Revised: 07/13/2015 Document Reviewed: 08/15/2014 Elsevier Interactive Patient Education  2017 [place] responded well to the week increase of HCTZ. We will not need to continue since your weight and swelling improved. We will call you when your lab results are available. Recommend applying heating pad to neck for 71mins several times a day.   Perform neck exercises as tolerated. Please have labs completed in Oct and regular follow-up in jan 2019, sooner if needed. GREAT TO SEE YOU!

## 2016-10-10 NOTE — Progress Notes (Signed)
Subjective:    Patient ID: [name], female    DOB: 1923-06-08, 83 y.o.   MRN: [id number]  HPI:  [name] is here for f/u: bil lower extremity edema.   CXR was negative for CHF.  She has lost 4 lbs since last OV and denies CP/dyspnea at rest/palpitations/swelling of ankles/feet.  She completed the 7 day course of daily HCTZ 12.5mg . She has one acute complaint-cervical neck stiffness and difficulty rotating head to R.  She periodically receives trigger point injections with Ortho, last treatment was April 2018. Daughter at [facility] during [place].  Patient Care Team    Relationship Specialty Notifications Start End  Coleman, Valetta Fuller D, NP PCP - General Family Medicine  03/13/16   Magnus Sinning, MD Consulting Physician Physical Medicine and Rehabilitation  03/13/16   Bo Merino, MD Consulting Physician Rheumatology  03/13/16   Irene Shipper, MD Consulting Physician Gastroenterology  03/13/16   Webb Laws, Hawley Referring Physician Optometry  03/13/16     Patient Active Problem List   Diagnosis Date Noted  . Cervicalgia 10/10/2016  . Lower extremity edema 10/03/2016  . Dyspnea on exertion 10/03/2016  . Right arm pain 09/04/2016  . Migraine 09/04/2016  . Numerous moles 09/04/2016  . Frequent headaches 09/04/2016  . Chronic insomnia 05/11/2015  . Vitamin D deficiency 05/17/2014  . Counseling regarding end of life decision making 05/03/2014  . Mild dementia 05/03/2014  . Lumbago 05/11/2012  . Vitamin B 12 deficiency 05/11/2012  . Dysphagia, pharyngoesophageal phase 12/17/2010  . Esophageal reflux 12/17/2010  . Abdominal hernia 12/17/2010  . Anemia, iron deficiency 12/06/2010  . HYPERCHOLESTEROLEMIA, MILD 11/19/2008  . Venous (peripheral) insufficiency 11/14/2007  . Essential hypertension, benign 05/14/2007  . Arrhythmia 05/14/2007  . CONSTIPATION, CHRONIC 05/14/2007  . Osteoporosis 05/14/2007  . Benign essential tremor 05/14/2007  . HIATAL HERNIA 12/18/2006  .  DEGENERATIVE JOINT DISEASE, GENERALIZED 12/18/2006     Past Medical History:  Diagnosis Date  . Abnormal involuntary movements(781.0)   . Anemia   . Anxiety state, unspecified   . Blood transfusion   . Cardiac dysrhythmia, unspecified   . Chronic constipation   . DJD (degenerative joint disease)   . Esophageal stricture   . Generalized osteoarthrosis, unspecified site   . GERD (gastroesophageal reflux disease)   . Hiatal hernia   . Hypertension   . Osteoporosis   . Pelvic adhesions   . Postgastrectomy syndrome   . Pure hypercholesterolemia   . Tortuous colon   . Urinary tract infection, site not specified   . Venous insufficiency   . Vitamin B12 deficiency      Past Surgical History:  Procedure Laterality Date  . CHOLECYSTECTOMY    . COLONOSCOPY    . ESOPHAGUS SURGERY  2003  . mole biopsy- left neck area Left   . NISSEN FUNDOPLICATION    . TOTAL ABDOMINAL HYSTERECTOMY     DUB,   . UPPER GASTROINTESTINAL ENDOSCOPY       Family History  Problem Relation Age of Onset  . Cancer Mother        unknown type, most likely colon  . Heart disease Brother   . Arthritis Father   . Heart disease Sister      History  Drug Use No     History  Alcohol Use No     History  Smoking Status  . Never Smoker  Smokeless Tobacco  . Never Used     Outpatient Encounter Prescriptions as of 10/10/2016  Medication Sig  . acetaminophen (TYLENOL) 500 MG tablet Take 1,000 mg by mouth as needed.   . AMBULATORY NON FORMULARY MEDICATION Medication Name: GI Cocktail with 20cc Maalox, 10cc Donnatal, 20cc Viscous Lidocaine 2%, take 30cc dose q8hr as needed for GI upset.  . butalbital-acetaminophen-caffeine (FIORICET, ESGIC) 50-325-40 MG tablet Take 1 tablet by mouth every 6 (six) hours as needed for headache.  . Calcium-Vitamin D (CALTRATE 600 PLUS-VIT D PO) Take 1 tablet by mouth daily.  Marland Kitchen diltiazem (CARDIZEM CD) 180 MG 24 hr capsule TAKE 1 CAPSULE BY MOUTH EVERY DAY.  Marland Kitchen  losartan-hydrochlorothiazide (HYZAAR) 50-12.5 MG tablet TAKE ONE TABLET BY MOUTH ONCE DAILY  . MELATONIN PO Take by mouth at bedtime.  . metoprolol succinate (TOPROL-XL) 50 MG 24 hr tablet TAKE 1/2 TABLET BY MOUTH EVERY DAY.  Marland Kitchen MISC NATURAL PRODUCTS PO Take 1 tablet by mouth daily. Ginger & Turmeric  . Multiple Vitamins-Minerals (PRESERVISION AREDS 2 PO) Take 2 capsules by mouth daily.    Marland Kitchen omeprazole (PRILOSEC) 20 MG capsule TAKE 1 CAPSULE BY MOUTH DAILY.  . pravastatin (PRAVACHOL) 40 MG tablet TAKE 1 TABLET BY MOUTH ONCE DAILY IN THE EVENING.  . traMADol (ULTRAM) 50 MG tablet Take 1 tablet (50 mg total) by mouth every 8 (eight) hours as needed.  . [DISCONTINUED] hydrochlorothiazide (HYDRODIURIL) 12.5 MG tablet Take 1 tablet (12.5 mg total) by mouth daily.   No facility-administered encounter medications on file as of 10/10/2016.     Allergies: Patient has no known allergies.  Body mass index is 26.13 kg/m.  Blood pressure 122/68, pulse 67, height 5\' 1"  (1.549 m), weight 138 lb 4.8 oz (62.7 kg).    Review of Systems  Constitutional: Positive for fatigue. Negative for activity change, appetite change, chills, diaphoresis, fever and unexpected weight change.  Respiratory: Negative for cough, chest tightness, shortness of breath, wheezing and stridor.   Cardiovascular: Negative for chest pain, palpitations and leg swelling.  Musculoskeletal: Positive for arthralgias, back pain, gait problem, joint swelling, myalgias, neck pain and neck stiffness.  Skin: Negative for color change, pallor, rash and wound.  Neurological: Negative for dizziness and headaches.       Objective:   Physical Exam  Constitutional: She appears well-developed and well-nourished. No distress.  HENT:  Head: Normocephalic and atraumatic.  Right Ear: External ear normal. Decreased hearing is noted.  Left Ear: External ear normal. Decreased hearing is noted.  HOH despite bil hearing aids  Eyes: Pupils are equal,  round, and reactive to light. Conjunctivae are normal.  Neck: Neck supple. Muscular tenderness present. Decreased range of motion present.  Cardiovascular: Normal rate, regular rhythm, normal heart sounds and intact distal pulses.   No murmur heard. Pulmonary/Chest: Effort normal and breath sounds normal. No respiratory distress. She has no wheezes. She has no rales. She exhibits no tenderness.  Lymphadenopathy:    She has no cervical adenopathy.  Neurological: She is alert.  Skin: Skin is warm and dry. No rash noted. She is not diaphoretic. No erythema. No pallor.  Psychiatric: She has a normal mood and affect. Her behavior is normal. Judgment and thought content normal.  Nursing note and vitals reviewed.         Assessment & Plan:   1. High risk medication use   2. Essential hypertension, benign   3. Lower extremity edema   4. Cervicalgia     Essential hypertension, benign BP at goal 122/68, HR 67. Continue Toprol 50mg  1/2 tab, Hyzaar 50/12.5mg , diltiazem 180mg  daily. Bil  ankle/foot edema completely resolved. BMP re-checked-will call when results are available.   Lower extremity edema BP at goal 122/68, HR 67 Bil ankle/foot edema completely resolved. Completed 7 day course of additional HCTZ 12.5mg . BMP rechecked-will call when results are available.   Cervicalgia Heating pad and daily stretching as tolerated. Due to age and co-morbidities muscle relaxers are not prudent. Last cervical trigger point injection was April 2018. F/u with ortho as needed.    FOLLOW-UP:  Return in about 5 months (around 03/12/2017) for Regular Follow Up.

## 2016-10-10 NOTE — Assessment & Plan Note (Addendum)
BP at goal 122/68, HR 67 Bil ankle/foot edema completely resolved. Completed 7 day course of additional HCTZ 12.5mg . BMP rechecked-will call when results are available.

## 2016-10-11 LAB — BASIC METABOLIC PANEL
BUN/Creatinine Ratio: 17 (ref 12–28)
BUN: 16 mg/dL (ref 10–36)
CALCIUM: 10 mg/dL (ref 8.7–10.3)
CO2: 27 mmol/L (ref 20–29)
CREATININE: 0.92 mg/dL (ref 0.57–1.00)
Chloride: 103 mmol/L (ref 96–106)
GFR calc Af Amer: 63 mL/min/{1.73_m2} (ref >59)
GFR, EST NON AFRICAN AMERICAN: 55 mL/min/{1.73_m2} — AB (ref >59)
Glucose: 91 mg/dL (ref 65–99)
Potassium: 4 mmol/L (ref 3.5–5.2)
SODIUM: 143 mmol/L (ref 134–144)

## 2016-11-20 ENCOUNTER — Telehealth: Payer: Self-pay

## 2016-11-20 MED ORDER — AMBULATORY NON FORMULARY MEDICATION: 6 refills | Status: DC

## 2016-11-20 NOTE — Telephone Encounter (Signed)
rx for lidocaine mixture faxed

## 2016-11-21 ENCOUNTER — Other Ambulatory Visit: Payer: Self-pay | Admitting: Family Medicine

## 2016-11-27 ENCOUNTER — Other Ambulatory Visit: Payer: Medicare Other

## 2016-12-05 ENCOUNTER — Other Ambulatory Visit: Payer: Self-pay | Admitting: Family Medicine

## 2017-03-04 ENCOUNTER — Telehealth: Payer: Self-pay | Admitting: Adult Health

## 2017-03-04 NOTE — Telephone Encounter (Signed)
Spoke w/ Ms. Christoper Fabian who states that she and her sister have notice a significant decline in pt's mental status.  They have also noticed that the patient is having more and more difficulty hearing despite using her hearing aids.  Advised Ms. Christoper Fabian that we will do an MMSE at pt's appt on 03/05/17 as well as evaluate for possible cerumen impaction.  Charyl Bigger, CMA

## 2017-03-04 NOTE — Telephone Encounter (Signed)
Pt's daughter ms. [name] called [phone] request PCP/ Valetta Fuller speak with her & sister regarding 82 year old parent's Comprehension & family is concerned about Mother living alone wanted provider advice. --Family wishes PCP to call them or to see them separate from mother to discuss Geriatric care.  --glh

## 2017-03-04 NOTE — Progress Notes (Signed)
Subjective:    Patient ID: [name], female    DOB: 30-Mar-1923, 84 y.o.   MRN: [id number]  HPI: 10/10/16 OV:  [name] is here for f/u: bil lower extremity edema.   CXR was negative for CHF.  She has lost 4 lbs since last OV and denies CP/dyspnea at rest/palpitations/swelling of ankles/feet.  She completed the 7 day course of daily HCTZ 12.5mg . She has one acute complaint-cervical neck stiffness and difficulty rotating head to R.  She periodically receives trigger point injections with Ortho, last treatment was April 2018. Daughter at [facility] during [place].  03/05/17 OV: [name] is here for f/u:HTN, HL, Chronic Constipation, Benign essential tremor.  Family has concern's about her safety at home and deteriorating memory/cognition. Daughter's as BS and shared the following: -Pt cannot recall what she eats during the day -Pt cannot recall recent events - Pt feel twice in fall 2018- denies striking head or LOC -[employer] issued Drivers's License renewal, despite strong family objections.  She continues to drive to grocery store every Monday am and church on sundays.  She did get lost once 2 years ago, found on side of he road by our [name] -Daughter's want her to move to assisted living home  MMSE 17/30, after long discussion family declines referral to Neurology.  Will perform MMSE at future chronic f/u OVs to watch trend.  Pt denies CP/dyspnea/palpiations/HA/dizziness.  Pt reports medication compliance and denies SE.    She denies lower extremity edema.  She reports only using the microwave, never the stove/ove.  She only lives on bottom floor of home, she does have a "lift chair" that will safely transport her to second floor if needed.   She reports decrease in hearing, she has appt with audiologist tomorrow to have hearing aids cleaned.   Patient Care Team    Relationship Specialty Notifications Start End  Francisco, Valetta Fuller D, NP PCP - General Family Medicine  03/13/16   Magnus Sinning,  MD Consulting Physician Physical Medicine and Rehabilitation  03/13/16   Bo Merino, MD Consulting Physician Rheumatology  03/13/16   Irene Shipper, MD Consulting Physician Gastroenterology  03/13/16   Webb Laws, [place] Referring Physician Optometry  03/13/16     Patient Active Problem List   Diagnosis Date Noted  . Cognitive changes 03/05/2017  . Healthcare maintenance 03/05/2017  . Hypertension 03/05/2017  . Cervicalgia 10/10/2016  . Lower extremity edema 10/03/2016  . Dyspnea on exertion 10/03/2016  . Right arm pain 09/04/2016  . Migraine 09/04/2016  . Numerous moles 09/04/2016  . Frequent headaches 09/04/2016  . Chronic insomnia 05/11/2015  . Vitamin D deficiency 05/17/2014  . Counseling regarding end of life decision making 05/03/2014  . Mild dementia 05/03/2014  . Lumbago 05/11/2012  . Vitamin B 12 deficiency 05/11/2012  . Dysphagia, pharyngoesophageal phase 12/17/2010  . Esophageal reflux 12/17/2010  . Abdominal hernia 12/17/2010  . Anemia, iron deficiency 12/06/2010  . HYPERCHOLESTEROLEMIA, MILD 11/19/2008  . Venous (peripheral) insufficiency 11/14/2007  . Essential hypertension, benign 05/14/2007  . Arrhythmia 05/14/2007  . CONSTIPATION, CHRONIC 05/14/2007  . Osteoporosis 05/14/2007  . Benign essential tremor 05/14/2007  . HIATAL HERNIA 12/18/2006  . DEGENERATIVE JOINT DISEASE, GENERALIZED 12/18/2006     Past Medical History:  Diagnosis Date  . Abnormal involuntary movements(781.0)   . Anemia   . Anxiety state, unspecified   . Blood transfusion   . Cardiac dysrhythmia, unspecified   . Chronic constipation   . DJD (degenerative joint disease)   .  Esophageal stricture   . Generalized osteoarthrosis, unspecified site   . GERD (gastroesophageal reflux disease)   . Hiatal hernia   . Hypertension   . Osteoporosis   . Pelvic adhesions   . Postgastrectomy syndrome   . Pure hypercholesterolemia   . Tortuous colon   . Urinary tract infection, site not  specified   . Venous insufficiency   . Vitamin B12 deficiency      Past Surgical History:  Procedure Laterality Date  . CHOLECYSTECTOMY    . COLONOSCOPY    . ESOPHAGUS SURGERY  2003  . mole biopsy- left neck area Left   . NISSEN FUNDOPLICATION    . TOTAL ABDOMINAL HYSTERECTOMY     DUB,   . UPPER GASTROINTESTINAL ENDOSCOPY       Family History  Problem Relation Age of Onset  . Cancer Mother        unknown type, most likely colon  . Heart disease Brother   . Arthritis Father   . Heart disease Sister      Social History   Substance and Sexual Activity  Drug Use No     Social History   Substance and Sexual Activity  Alcohol Use No  . Alcohol/week: 0.0 oz     Social History   Tobacco Use  Smoking Status Never Smoker  Smokeless Tobacco Never Used     Outpatient Encounter Medications as of 03/05/2017  Medication Sig  . acetaminophen (TYLENOL) 500 MG tablet Take 1,000 mg by mouth as needed.   . AMBULATORY NON FORMULARY MEDICATION Medication Name: GI Cocktail with 20cc Maalox, 20cc Viscous Lidocaine 2%, take 30cc dose q8hr as needed for GI upset.  . Calcium-Vitamin D (CALTRATE 600 PLUS-VIT D PO) Take 1 tablet by mouth daily.  Marland Kitchen diltiazem (CARDIZEM CD) 180 MG 24 hr capsule TAKE 1 CAPSULE BY MOUTH EVERY DAY.  Marland Kitchen losartan-hydrochlorothiazide (HYZAAR) 50-12.5 MG tablet TAKE ONE TABLET BY MOUTH ONCE DAILY  . MELATONIN PO Take by mouth at bedtime.  . metoprolol succinate (TOPROL-XL) 50 MG 24 hr tablet TAKE 1/2 TABLET BY MOUTH EVERY DAY.  . Multiple Vitamins-Minerals (PRESERVISION AREDS 2 PO) Take 2 capsules by mouth daily.    Marland Kitchen omeprazole (PRILOSEC) 20 MG capsule TAKE 1 CAPSULE BY MOUTH DAILY.  Marland Kitchen polyethylene glycol (MIRALAX / GLYCOLAX) packet Take 17 g by mouth daily.  . pravastatin (PRAVACHOL) 40 MG tablet TAKE 1 TABLET BY MOUTH ONCE DAILY IN THE EVENING.  . [DISCONTINUED] butalbital-acetaminophen-caffeine (FIORICET, ESGIC) 50-325-40 MG tablet Take 1 tablet by mouth  every 6 (six) hours as needed for headache.  . [DISCONTINUED] MISC NATURAL PRODUCTS PO Take 1 tablet by mouth daily. Ginger & Turmeric  . [DISCONTINUED] traMADol (ULTRAM) 50 MG tablet Take 1 tablet (50 mg total) by mouth every 8 (eight) hours as needed.   No facility-administered encounter medications on file as of 03/05/2017.     Allergies: Patient has no known allergies.  Body mass index is 25.72 kg/m.  Blood pressure 112/90, pulse 66, height 5\' 1"  (1.549 m), weight 136 lb 1.6 oz (61.7 kg), SpO2 93 %.    Review of Systems  Constitutional: Positive for fatigue. Negative for activity change, appetite change, chills, diaphoresis, fever and unexpected weight change.  Respiratory: Negative for cough, chest tightness, shortness of breath, wheezing and stridor.   Cardiovascular: Negative for chest pain, palpitations and leg swelling.  Musculoskeletal: Positive for arthralgias, back pain, gait problem, joint swelling, myalgias, neck pain and neck stiffness.  Skin: Negative for color change,  pallor, rash and wound.  Neurological: Negative for dizziness and headaches.       Objective:   Physical Exam  Constitutional: She appears well-developed and well-nourished. No distress.  HENT:  Head: Normocephalic and atraumatic.  Right Ear: External ear normal. Decreased hearing is noted.  Left Ear: External ear normal. Decreased hearing is noted.  HOH despite bil hearing aids  Eyes: Conjunctivae are normal. Pupils are equal, round, and reactive to light.  Neck: Neck supple. Muscular tenderness present. Decreased range of motion present.  Cardiovascular: Normal rate, regular rhythm, normal heart sounds and intact distal pulses.  No murmur heard. Pulmonary/Chest: Effort normal and breath sounds normal. No respiratory distress. She has no wheezes. She has no rales. She exhibits no tenderness.  Lymphadenopathy:    She has no cervical adenopathy.  Neurological: She is alert.  Skin: Skin is warm  and dry. No rash noted. She is not diaphoretic. No erythema. No pallor.  Psychiatric: She has a normal mood and affect. Her behavior is normal. Judgment and thought content normal.  Nursing note and vitals reviewed.         Assessment & Plan:   1. Cognitive changes   2. HYPERCHOLESTEROLEMIA, MILD   3. Essential hypertension, benign   4. Age-related osteoporosis without current pathological fracture   5. Other iron deficiency anemia   6. Vitamin D deficiency   7. Healthcare maintenance   8. Benign essential tremor   9. Mild dementia   10. Lower extremity edema   11. Essential hypertension     Healthcare maintenance Please continue all medications as directed. Home health/PT orders placed. We will hold off on Neurology referral at this time, if you change your mind please call clinic. Recommend moving to assisted living for health, well-being, and safety. Please schedule complete physical with fasting labs in 4 months.  Mild dementia MMSE 17/30 today Family feels that is worsening. Lengthy discussion today and family declined referral to Neurology. Will perform MMSE at all future chronic f/u OVs to track trend.  Benign essential tremor Stable  Lower extremity edema Has not experienced exacerbations since 09/2016 Reports intermittently wearing support hose Recommend to elevate lower extremities as often as possible.   Hypertension BP at goal 112/60, HR 66 Continue Cardizem 180mg  daily She denies acute cardiac sx's    Pt was in the office today for 40+ minutes, with over 50% time spent in face to face counseling of various medical concerns and in coordination of care FOLLOW-UP:  Return in about 4 months (around 07/03/2017) for CPE.

## 2017-03-05 ENCOUNTER — Ambulatory Visit (INDEPENDENT_AMBULATORY_CARE_PROVIDER_SITE_OTHER): Payer: Medicare Other | Admitting: Adult Health

## 2017-03-05 ENCOUNTER — Encounter: Payer: Self-pay | Admitting: Adult Health

## 2017-03-05 ENCOUNTER — Other Ambulatory Visit: Payer: Self-pay | Admitting: Adult Health

## 2017-03-05 VITALS — BP 112/90 | HR 66 | Ht 61.0 in | Wt 136.1 lb

## 2017-03-05 DIAGNOSIS — Z0000 Encounter for general adult medical examination without abnormal findings: Secondary | ICD-10-CM | POA: Diagnosis not present

## 2017-03-05 DIAGNOSIS — D508 Other iron deficiency anemias: Secondary | ICD-10-CM

## 2017-03-05 DIAGNOSIS — G250 Essential tremor: Secondary | ICD-10-CM

## 2017-03-05 DIAGNOSIS — F0390 Unspecified dementia without behavioral disturbance: Secondary | ICD-10-CM | POA: Diagnosis not present

## 2017-03-05 DIAGNOSIS — E7800 Pure hypercholesterolemia, unspecified: Secondary | ICD-10-CM | POA: Diagnosis not present

## 2017-03-05 DIAGNOSIS — E559 Vitamin D deficiency, unspecified: Secondary | ICD-10-CM | POA: Diagnosis not present

## 2017-03-05 DIAGNOSIS — R4189 Other symptoms and signs involving cognitive functions and awareness: Secondary | ICD-10-CM | POA: Diagnosis not present

## 2017-03-05 DIAGNOSIS — F03A0 Unspecified dementia, mild, without behavioral disturbance, psychotic disturbance, mood disturbance, and anxiety: Secondary | ICD-10-CM

## 2017-03-05 DIAGNOSIS — M810 Age-related osteoporosis without current pathological fracture: Secondary | ICD-10-CM | POA: Diagnosis not present

## 2017-03-05 DIAGNOSIS — I10 Essential (primary) hypertension: Secondary | ICD-10-CM | POA: Diagnosis not present

## 2017-03-05 DIAGNOSIS — R600 Localized edema: Secondary | ICD-10-CM

## 2017-03-05 NOTE — Assessment & Plan Note (Signed)
Stable

## 2017-03-05 NOTE — Assessment & Plan Note (Signed)
Please continue all medications as directed. Home health/PT orders placed. We will hold off on Neurology referral at this time, if you change your mind please call clinic. Recommend moving to assisted living for health, well-being, and safety. Please schedule complete physical with fasting labs in 4 months.

## 2017-03-05 NOTE — Patient Instructions (Addendum)
Heart-Healthy Eating Plan Many factors influence your heart health, including eating and exercise habits. Heart (coronary) risk increases with abnormal blood fat (lipid) levels. Heart-healthy meal planning includes limiting unhealthy fats, increasing healthy fats, and making other small dietary changes. This includes maintaining a healthy body weight to help keep lipid levels within a normal range. What is my plan? Your health care provider recommends that you:  Get no more than ___25___% of the total calories in your daily diet from fat.  Limit your intake of saturated fat to less than ____5___% of your total calories each day.  Limit the amount of cholesterol in your diet to less than __300___ mg per day.  What types of fat should I choose?  Choose healthy fats more often. Choose monounsaturated and polyunsaturated fats, such as olive oil and canola oil, flaxseeds, walnuts, almonds, and seeds.  Eat more omega-3 fats. Good choices include salmon, mackerel, sardines, tuna, flaxseed oil, and ground flaxseeds. Aim to eat fish at least two times each week.  Limit saturated fats. Saturated fats are primarily found in animal products, such as meats, butter, and cream. Plant sources of saturated fats include palm oil, palm kernel oil, and coconut oil.  Avoid foods with partially hydrogenated oils in them. These contain trans fats. Examples of foods that contain trans fats are stick margarine, some tub margarines, cookies, crackers, and other baked goods. What general guidelines do I need to follow?  Check food labels carefully to identify foods with trans fats or high amounts of saturated fat.  Fill one half of your plate with vegetables and green salads. Eat 4-5 servings of vegetables per day. A serving of vegetables equals 1 cup of raw leafy vegetables,  cup of raw or cooked cut-up vegetables, or  cup of vegetable juice.  Fill one fourth of your plate with whole grains. Look for the word  "whole" as the first word in the ingredient list.  Fill one fourth of your plate with lean protein foods.  Eat 4-5 servings of fruit per day. A serving of fruit equals one medium whole fruit,  cup of dried fruit,  cup of fresh, frozen, or canned fruit, or  cup of 100% fruit juice.  Eat more foods that contain soluble fiber. Examples of foods that contain this type of fiber are apples, broccoli, carrots, beans, peas, and barley. Aim to get 20-30 g of fiber per day.  Eat more home-cooked food and less restaurant, buffet, and fast food.  Limit or avoid alcohol.  Limit foods that are high in starch and sugar.  Avoid fried foods.  Cook foods by using methods other than frying. Baking, boiling, grilling, and broiling are all great options. Other fat-reducing suggestions include: ? Removing the skin from poultry. ? Removing all visible fats from meats. ? Skimming the fat off of stews, soups, and gravies before serving them. ? Steaming vegetables in water or broth.  Lose weight if you are overweight. Losing just 5-10% of your initial body weight can help your overall health and prevent diseases such as diabetes and heart disease.  Increase your consumption of nuts, legumes, and seeds to 4-5 servings per week. One serving of dried beans or legumes equals  cup after being cooked, one serving of nuts equals 1 ounces, and one serving of seeds equals  ounce or 1 tablespoon.  You may need to monitor your salt (sodium) intake, especially if you have high blood pressure. Talk with your health care provider or dietitian to get  more information about reducing sodium. What foods can I eat? Grains  Breads, including [place], white, pita, wheat, raisin, rye, oatmeal, and [place]. Tortillas that are neither fried nor made with lard or trans fat. Low-fat rolls, including hotdog and hamburger buns and English muffins. Biscuits. Muffins. Waffles. Pancakes. Light popcorn. Whole-grain cereals. Flatbread.  Melba toast. Pretzels. Breadsticks. Rusks. Low-fat snacks and crackers, including oyster, saltine, matzo, graham, animal, and rye. Rice and pasta, including brown rice and those that are made with whole wheat. Vegetables All vegetables. Fruits All fruits, but limit coconut. Meats and Other Protein Sources Lean, well-trimmed beef, veal, pork, and lamb. Chicken and [place] without skin. All fish and shellfish. Wild duck, rabbit, pheasant, and venison. Egg whites or low-cholesterol egg substitutes. Dried beans, peas, lentils, and tofu.Seeds and most nuts. Dairy Low-fat or nonfat cheeses, including ricotta, string, and mozzarella. Skim or 1% milk that is liquid, powdered, or evaporated. Buttermilk that is made with low-fat milk. Nonfat or low-fat yogurt. Beverages Mineral water. Diet carbonated beverages. Sweets and Desserts Sherbets and fruit ices. Honey, jam, marmalade, jelly, and syrups. Meringues and gelatins. Pure sugar candy, such as hard candy, jelly beans, gumdrops, mints, marshmallows, and small amounts of dark chocolate. [employer] Eat all sweets and desserts in moderation. Fats and Oils Nonhydrogenated (trans-free) margarines. Vegetable oils, including soybean, sesame, sunflower, olive, peanut, safflower, corn, canola, and cottonseed. Salad dressings or mayonnaise that are made with a vegetable oil. Limit added fats and oils that you use for cooking, baking, salads, and as spreads. Other Cocoa powder. Coffee and tea. All seasonings and condiments. The items listed above may not be a complete list of recommended foods or beverages. Contact your dietitian for more options. What foods are not recommended? Grains Breads that are made with saturated or trans fats, oils, or whole milk. Croissants. Butter rolls. Cheese breads. Sweet rolls. Donuts. Buttered popcorn. Chow mein noodles. High-fat crackers, such as cheese or butter crackers. Meats and Other Protein Sources Fatty meats, such  as hotdogs, short ribs, sausage, spareribs, bacon, ribeye roast or steak, and mutton. High-fat deli meats, such as salami and bologna. Caviar. Domestic duck and goose. Organ meats, such as kidney, liver, sweetbreads, brains, gizzard, chitterlings, and heart. Dairy Cream, sour cream, cream cheese, and creamed cottage cheese. Whole milk cheeses, including blue (bleu), Monte[place] Jack, [place], [place], [place], [place], [place], [place], [place], and [place]. Whole or 2% milk that is liquid, evaporated, or condensed. Whole buttermilk. Cream sauce or high-fat cheese sauce. Yogurt that is made from whole milk. Beverages Regular sodas and drinks with added sugar. Sweets and Desserts Frosting. Pudding. Cookies. Cakes other than angel food cake. Candy that has milk chocolate or white chocolate, hydrogenated fat, butter, coconut, or unknown ingredients. Buttered syrups. Full-fat ice cream or ice cream drinks. Fats and Oils Gravy that has suet, meat fat, or shortening. Cocoa butter, hydrogenated oils, palm oil, coconut oil, palm kernel oil. These can often be found in baked products, candy, fried foods, nondairy creamers, and whipped toppings. Solid fats and shortenings, including bacon fat, salt pork, lard, and butter. Nondairy cream substitutes, such as coffee creamers and sour cream substitutes. Salad dressings that are made of unknown oils, cheese, or sour cream. The items listed above may not be a complete list of foods and beverages to avoid. Contact your dietitian for more information. This information is not intended to replace advice given to you by your health care provider. Make sure you discuss any questions you have with your health care  provider. Document Released: 11/14/2007 Document Revised: 08/25/2015 Document Reviewed: 07/29/2013 Elsevier Interactive Patient Education  2018 [employer].   Dementia Dementia is the loss of two or more brain functions, such as:  Memory.  Decision  making.  Behavior.  Speaking.  Thinking.  Problem solving.  There are many types of dementia. The most common type is called progressive dementia. Progressive dementia gets worse with time and it is irreversible. An example of this type of dementia is Alzheimer disease. What are the causes? This condition may be caused by:  Nerve cell damage in the brain.  Genetic mutations.  Certain medicines.  Multiple small strokes.  An infection, such as chronic meningitis.  A metabolic problem, such as vitamin B12 deficiency or thyroid disease.  Pressure on the brain, such as from a tumor or blood clot.  What are the signs or symptoms? Symptoms of this condition include:  Sudden changes in mood.  Depression.  Problems with balance.  Changes in personality.  Poor short-term memory.  Agitation.  Delusions.  Hallucinations.  Having a hard time: ? Speaking thoughts. ? Finding words. ? Solving problems. ? Doing familiar tasks. ? Understanding familiar ideas.  How is this diagnosed? This condition is diagnosed with an assessment by your health care provider. During this assessment, your health care provider will talk with you and your family, friends, or caregivers about your symptoms. A thorough medical history will be taken, and you will have a physical exam and tests. Tests may include:  Lab tests, such as blood or urine tests.  Imaging tests, such as a CT scan, PET scan, or MRI.  A lumbar puncture. This test involves removing and testing a small amount of the fluid that surrounds the brain and spinal cord.  An electroencephalogram (EEG). In this test, small metal discs are used to measure electrical activity in the brain.  Memory tests, cognitive tests, and neuropsychological tests. These tests evaluate brain function.  How is this treated? Treatment depends on the cause of the dementia. It may involve taking medicines that may help:  To control the  dementia.  To slow down the disease.  To manage symptoms.  In some cases, treating the cause of the dementia can improve symptoms, reverse symptoms, or slow down how quickly the dementia gets worse. Your health care provider can help direct you to support groups, organizations, and other health care providers who can help with decisions about your care. Follow these instructions at home: Medicine  Take over-the-counter and prescription medicines only as told by your health care provider.  Avoid taking medicines that can affect thinking, such as pain or sleeping medicines. Lifestyle   Make healthy lifestyle choices: ? Be physically active as told by your health care provider. ? Do not use any tobacco products, such as cigarettes, chewing tobacco, and e-cigarettes. If you need help quitting, ask your health care provider. ? Eat a healthy diet. ? Practice stress-management techniques when you get stressed. ? Stay social.  Drink enough fluid to keep your urine clear or pale yellow.  Make sure to get quality sleep. These tips can help you to get a good night's rest: ? Avoid napping during the day. ? Keep your sleeping area dark and cool. ? Avoid exercising during the few hours before you go to bed. ? Avoid caffeine products in the evening. General instructions  Work with your health care provider to determine what you need help with and what your safety needs are.  If you were  given a bracelet that tracks your location, make sure to wear it.  Keep all follow-up visits as told by your health care provider. This is important. Contact a health care provider if:  You have any new symptoms.  You have problems with choking or swallowing.  You have any symptoms of a different illness. Get help right away if:  You develop a fever.  You have new or worsening confusion.  You have new or worsening sleepiness.  You have a hard time staying awake.  You or your family members  become concerned for your safety. This information is not intended to replace advice given to you by your health care provider. Make sure you discuss any questions you have with your health care provider. Document Released: 07/31/2000 Document Revised: 06/15/2015 Document Reviewed: 11/02/2014 Elsevier Interactive Patient Education  [employer].  Please continue all medications as directed. Home health/PT orders placed. We will hold off on Neurology referral at this time, if you change your mind please call clinic. Recommend moving to assisted living for health, well-being, and safety. Please schedule complete physical with fasting labs in 4 months. NICE TO SEE YOU!

## 2017-03-05 NOTE — Assessment & Plan Note (Signed)
MMSE 17/30 today Family feels that is worsening. Lengthy discussion today and family declined referral to Neurology. Will perform MMSE at all future chronic f/u OVs to track trend.

## 2017-03-05 NOTE — Assessment & Plan Note (Signed)
BP at goal 112/60, HR 66 Continue Cardizem 180mg  daily She denies acute cardiac sx's

## 2017-03-05 NOTE — Assessment & Plan Note (Addendum)
Has not experienced exacerbations since 09/2016 Reports intermittently wearing support hose Recommend to elevate lower extremities as often as possible.

## 2017-03-14 ENCOUNTER — Other Ambulatory Visit: Payer: Self-pay | Admitting: Adult Health

## 2017-05-14 DIAGNOSIS — I10 Essential (primary) hypertension: Secondary | ICD-10-CM | POA: Diagnosis not present

## 2017-05-14 DIAGNOSIS — Z961 Presence of intraocular lens: Secondary | ICD-10-CM | POA: Diagnosis not present

## 2017-06-25 ENCOUNTER — Other Ambulatory Visit (INDEPENDENT_AMBULATORY_CARE_PROVIDER_SITE_OTHER): Payer: Medicare Other

## 2017-06-25 DIAGNOSIS — E7800 Pure hypercholesterolemia, unspecified: Secondary | ICD-10-CM

## 2017-06-25 DIAGNOSIS — E559 Vitamin D deficiency, unspecified: Secondary | ICD-10-CM

## 2017-06-25 DIAGNOSIS — D500 Iron deficiency anemia secondary to blood loss (chronic): Secondary | ICD-10-CM | POA: Diagnosis not present

## 2017-06-25 DIAGNOSIS — M810 Age-related osteoporosis without current pathological fracture: Secondary | ICD-10-CM

## 2017-06-25 DIAGNOSIS — Z0000 Encounter for general adult medical examination without abnormal findings: Secondary | ICD-10-CM

## 2017-06-25 DIAGNOSIS — I10 Essential (primary) hypertension: Secondary | ICD-10-CM

## 2017-06-25 DIAGNOSIS — D508 Other iron deficiency anemias: Secondary | ICD-10-CM

## 2017-06-26 LAB — COMPREHENSIVE METABOLIC PANEL
ALT: 14 IU/L (ref 0–32)
AST: 21 IU/L (ref 0–40)
Albumin/Globulin Ratio: 1.5 (ref 1.2–2.2)
Albumin: 3.9 g/dL (ref 3.2–4.6)
Alkaline Phosphatase: 104 IU/L (ref 39–117)
BUN/Creatinine Ratio: 15 (ref 12–28)
BUN: 13 mg/dL (ref 10–36)
Bilirubin Total: 0.6 mg/dL (ref 0.0–1.2)
CO2: 24 mmol/L (ref 20–29)
CREATININE: 0.87 mg/dL (ref 0.57–1.00)
Calcium: 9.5 mg/dL (ref 8.7–10.3)
Chloride: 105 mmol/L (ref 96–106)
GFR calc Af Amer: 67 mL/min/{1.73_m2} (ref >59)
GFR calc non Af Amer: 58 mL/min/{1.73_m2} — ABNORMAL LOW (ref >59)
GLOBULIN, TOTAL: 2.6 g/dL (ref 1.5–4.5)
GLUCOSE: 94 mg/dL (ref 65–99)
Potassium: 3.7 mmol/L (ref 3.5–5.2)
SODIUM: 142 mmol/L (ref 134–144)
Total Protein: 6.5 g/dL (ref 6.0–8.5)

## 2017-06-26 LAB — CBC WITH DIFFERENTIAL/PLATELET
BASOS ABS: 0 10*3/uL (ref 0.0–0.2)
Basos: 1 %
EOS (ABSOLUTE): 0.1 10*3/uL (ref 0.0–0.4)
Eos: 3 %
Hematocrit: 34.3 % (ref 34.0–46.6)
Hemoglobin: 11.4 g/dL (ref 11.1–15.9)
Immature Grans (Abs): 0 10*3/uL (ref 0.0–0.1)
Immature Granulocytes: 0 %
LYMPHS ABS: 1.8 10*3/uL (ref 0.7–3.1)
Lymphs: 33 %
MCH: 31.1 pg (ref 26.6–33.0)
MCHC: 33.2 g/dL (ref 31.5–35.7)
MCV: 94 fL (ref 79–97)
MONOS ABS: 0.5 10*3/uL (ref 0.1–0.9)
Monocytes: 9 %
Neutrophils Absolute: 2.9 10*3/uL (ref 1.4–7.0)
Neutrophils: 54 %
PLATELETS: 307 10*3/uL (ref 150–379)
RBC: 3.66 x10E6/uL — AB (ref 3.77–5.28)
RDW: 12.9 % (ref 12.3–15.4)
WBC: 5.4 10*3/uL (ref 3.4–10.8)

## 2017-06-26 LAB — HEMOGLOBIN A1C
Est. average glucose Bld gHb Est-mCnc: 103 mg/dL
HEMOGLOBIN A1C: 5.2 % (ref 4.8–5.6)

## 2017-06-26 LAB — LIPID PANEL
Chol/HDL Ratio: 3 ratio (ref 0.0–4.4)
Cholesterol, Total: 104 mg/dL (ref 100–199)
HDL: 35 mg/dL — ABNORMAL LOW (ref >39)
LDL Calculated: 44 mg/dL (ref 0–99)
Triglycerides: 126 mg/dL (ref 0–149)
VLDL Cholesterol Cal: 25 mg/dL (ref 5–40)

## 2017-06-26 LAB — TSH: TSH: 1.75 u[IU]/mL (ref 0.450–4.500)

## 2017-06-26 LAB — VITAMIN D 25 HYDROXY (VIT D DEFICIENCY, FRACTURES): VIT D 25 HYDROXY: 46.2 ng/mL (ref 30.0–100.0)

## 2017-07-02 NOTE — Progress Notes (Signed)
Subjective:    Patient ID: [name], female    DOB: September 18, 1923, 84 y.o.   MRN: [id number]  HPI: 10/10/16 OV:  [name] is here for f/u: bil lower extremity edema.   CXR was negative for CHF.  She has lost 4 lbs since last OV and denies CP/dyspnea at rest/palpitations/swelling of ankles/feet.  She completed the 7 day course of daily HCTZ 12.5mg . She has one acute complaint-cervical neck stiffness and difficulty rotating head to R.  She periodically receives trigger point injections with Ortho, last treatment was April 2018. Daughter at [facility] during [place].  03/05/17 OV: [name] is here for f/u:HTN, HL, Chronic Constipation, Benign essential tremor.  Family has concern's about her safety at home and deteriorating memory/cognition. Daughter's as BS and shared the following: -Pt cannot recall what she eats during the day -Pt cannot recall recent events - Pt feel twice in fall 2018- denies striking head or LOC -[employer] issued Drivers's License renewal, despite strong family objections.  She continues to drive to grocery store every Monday am and church on sundays.  She did get lost once 2 years ago, found on side of he road by our [place] -Daughter's want her to move to assisted living home  MMSE 17/30, after long discussion family declines referral to Neurology.  Will perform MMSE at future chronic f/u OVs to watch trend.  Pt denies CP/dyspnea/palpiations/HA/dizziness.  Pt reports medication compliance and denies SE.    She denies lower extremity edema.  She reports only using the microwave, never the stove/ove.  She only lives on bottom floor of home, she does have a "lift chair" that will safely transport her to second floor if needed.   She reports decrease in hearing, she has appt with audiologist tomorrow to have hearing aids cleaned.  07/03/17 OV: [name] is here for CPE MMSE today 22 Last MMSE 17/19 Mar 2017 Only acute complaint- R knee edema that started >1 week ago She denies  pain or instability.  She has Orthopedic Specialist that provided steroid injection last year. She denies numbness/tingling in R foot She reports being able perform ADLs, drive to grocery/church, and reports "being safe and happy in my home". Reviewed all recent labs. Daughter at [facility] during [place] Maintenance: PAP-not indicated Mammogram-not indicated DEXA- declined Immunizations-  Patient Care Team    Relationship Specialty Notifications Start End  Chewey, Valetta Fuller D, NP PCP - General Family Medicine  03/13/16   Magnus Sinning, MD Consulting Physician Physical Medicine and Rehabilitation  03/13/16   Bo Merino, MD Consulting Physician Rheumatology  03/13/16   Irene Shipper, MD Consulting Physician Gastroenterology  03/13/16   Webb Laws, [place] Referring Physician Optometry  03/13/16     Patient Active Problem List   Diagnosis Date Noted  . CKD (chronic kidney disease), symptom management only, stage 3 (moderate) ([place]) 07/03/2017  . Cognitive changes 03/05/2017  . Healthcare maintenance 03/05/2017  . Hypertension 03/05/2017  . Cervicalgia 10/10/2016  . Lower extremity edema 10/03/2016  . Dyspnea on exertion 10/03/2016  . Right arm pain 09/04/2016  . Migraine 09/04/2016  . Numerous moles 09/04/2016  . Frequent headaches 09/04/2016  . Chronic insomnia 05/11/2015  . Vitamin D deficiency 05/17/2014  . Counseling regarding end of life decision making 05/03/2014  . Mild dementia 05/03/2014  . Lumbago 05/11/2012  . Vitamin B 12 deficiency 05/11/2012  . Dysphagia, pharyngoesophageal phase 12/17/2010  . Esophageal reflux 12/17/2010  . Abdominal hernia 12/17/2010  . Anemia, iron  deficiency 12/06/2010  . HYPERCHOLESTEROLEMIA, MILD 11/19/2008  . Venous (peripheral) insufficiency 11/14/2007  . Essential hypertension, benign 05/14/2007  . Arrhythmia 05/14/2007  . CONSTIPATION, CHRONIC 05/14/2007  . Osteoporosis 05/14/2007  . Benign essential tremor 05/14/2007  . HIATAL  HERNIA 12/18/2006  . DEGENERATIVE JOINT DISEASE, GENERALIZED 12/18/2006     Past Medical History:  Diagnosis Date  . Abnormal involuntary movements(781.0)   . Anemia   . Anxiety state, unspecified   . Blood transfusion   . Cardiac dysrhythmia, unspecified   . Chronic constipation   . DJD (degenerative joint disease)   . Esophageal stricture   . Generalized osteoarthrosis, unspecified site   . GERD (gastroesophageal reflux disease)   . Hiatal hernia   . Hypertension   . Osteoporosis   . Pelvic adhesions   . Postgastrectomy syndrome   . Pure hypercholesterolemia   . Tortuous colon   . Urinary tract infection, site not specified   . Venous insufficiency   . Vitamin B12 deficiency      Past Surgical History:  Procedure Laterality Date  . CHOLECYSTECTOMY    . COLONOSCOPY    . ESOPHAGUS SURGERY  2003  . mole biopsy- left neck area Left   . NISSEN FUNDOPLICATION    . TOTAL ABDOMINAL HYSTERECTOMY     DUB,   . UPPER GASTROINTESTINAL ENDOSCOPY       Family History  Problem Relation Age of Onset  . Cancer Mother        unknown type, most likely colon  . Heart disease Brother   . Arthritis Father   . Heart disease Sister      Social History   Substance and Sexual Activity  Drug Use No     Social History   Substance and Sexual Activity  Alcohol Use No  . Alcohol/week: 0.0 oz     Social History   Tobacco Use  Smoking Status Never Smoker  Smokeless Tobacco Never Used     Outpatient Encounter Medications as of 07/03/2017  Medication Sig  . acetaminophen (TYLENOL) 500 MG tablet Take 1,000 mg by mouth as needed.   . AMBULATORY NON FORMULARY MEDICATION Medication Name: GI Cocktail with 20cc Maalox, 20cc Viscous Lidocaine 2%, take 30cc dose q8hr as needed for GI upset.  . Calcium-Vitamin D (CALTRATE 600 PLUS-VIT D PO) Take 1 tablet by mouth daily.  Marland Kitchen diltiazem (CARDIZEM CD) 180 MG 24 hr capsule TAKE 1 CAPSULE BY MOUTH EVERY DAY.  Marland Kitchen  losartan-hydrochlorothiazide (HYZAAR) 50-12.5 MG tablet Take 1 tablet by mouth daily.  Marland Kitchen MELATONIN PO Take by mouth at bedtime.  . metoprolol succinate (TOPROL-XL) 25 MG 24 hr tablet Take 1 tablet (25 mg total) by mouth daily. Take with or immediately following a meal.  . Multiple Vitamins-Minerals (PRESERVISION AREDS 2 PO) Take 2 capsules by mouth daily.    Marland Kitchen omeprazole (PRILOSEC) 20 MG capsule TAKE 1 CAPSULE BY MOUTH DAILY.  Marland Kitchen polyethylene glycol (MIRALAX / GLYCOLAX) packet Take 17 g by mouth daily.  . pravastatin (PRAVACHOL) 40 MG tablet Take 1 tablet (40 mg total) by mouth every evening.  . [DISCONTINUED] diltiazem (CARDIZEM CD) 180 MG 24 hr capsule TAKE 1 CAPSULE BY MOUTH EVERY DAY.  . [DISCONTINUED] losartan-hydrochlorothiazide (HYZAAR) 50-12.5 MG tablet TAKE ONE TABLET BY MOUTH ONCE DAILY  . [DISCONTINUED] metoprolol succinate (TOPROL-XL) 50 MG 24 hr tablet TAKE 1/2 TABLET BY MOUTH EVERY DAY.  . [DISCONTINUED] pravastatin (PRAVACHOL) 40 MG tablet TAKE 1 TABLET BY MOUTH ONCE DAILY IN THE EVENING.  . [  DISCONTINUED] diltiazem (DILACOR XR) 180 MG 24 hr capsule Take 1 capsule (180 mg total) by mouth daily.   No facility-administered encounter medications on file as of 07/03/2017.     Allergies: Patient has no known allergies.  Body mass index is 25.02 kg/m.  Blood pressure (!) 158/82, pulse (!) 59, height 5\' 1"  (1.549 m), weight 132 lb 6.4 oz (60.1 kg), SpO2 97 %.    Review of Systems  Constitutional: Positive for fatigue. Negative for activity change, appetite change, chills, diaphoresis, fever and unexpected weight change.  HENT: Negative for congestion.   Respiratory: Negative for cough, chest tightness, shortness of breath, wheezing and stridor.   Cardiovascular: Negative for chest pain, palpitations and leg swelling.  Gastrointestinal: Negative for abdominal distention, abdominal pain, blood in stool, constipation, diarrhea, nausea and vomiting.  Endocrine: Negative for cold  intolerance, heat intolerance, polydipsia, polyphagia and polyuria.  Genitourinary: Negative for difficulty urinating and flank pain.  Musculoskeletal: Positive for arthralgias, back pain, gait problem, joint swelling, myalgias, neck pain and neck stiffness.  Skin: Negative for color change, pallor, rash and wound.  Neurological: Negative for dizziness and headaches.  Hematological: Does not bruise/bleed easily.  Psychiatric/Behavioral: Negative for confusion, decreased concentration, dysphoric mood, hallucinations, self-injury, sleep disturbance and suicidal ideas. The patient is not nervous/anxious and is not hyperactive.        Objective:   Physical Exam  Constitutional: She appears well-developed and well-nourished. No distress.  HENT:  Head: Normocephalic and atraumatic.  Right Ear: External ear normal. Decreased hearing is noted.  Left Ear: External ear normal. Decreased hearing is noted.  HOH despite bil hearing aids  Eyes: Pupils are equal, round, and reactive to light. Conjunctivae are normal.  Neck: Neck supple. Muscular tenderness present. Decreased range of motion present.  Cardiovascular: Normal rate, regular rhythm, normal heart sounds and intact distal pulses.  No murmur heard. Pulmonary/Chest: Effort normal and breath sounds normal. No respiratory distress. She has no wheezes. She has no rales. She exhibits no tenderness.  Abdominal: Soft. Bowel sounds are normal. She exhibits no distension and no mass. There is no tenderness. There is no rebound and no guarding.  Musculoskeletal: She exhibits edema and deformity. She exhibits no tenderness.       Right knee: She exhibits swelling. No tenderness found.       Left knee: She exhibits no swelling.  Sig arthritic phalanges   Lymphadenopathy:    She has no cervical adenopathy.  Neurological: She is alert.  Skin: Skin is warm and dry. No rash noted. She is not diaphoretic. No erythema. No pallor.  Psychiatric: She has a  normal mood and affect. Her behavior is normal. Judgment and thought content normal.  Nursing note and vitals reviewed.     Assessment & Plan:   1. Essential hypertension, benign   2. Healthcare maintenance   3. Lower extremity edema   4. CKD (chronic kidney disease), symptom management only, stage 3 (moderate) ([facility])     CKD (chronic kidney disease), symptom management only, stage 3 (moderate) ([facility]) Estimated Creatinine Clearance: 35 mL/min (by C-G formula based on SCr of 0.87 mg/dL).  GFR 58 No medication adjustment needed   Healthcare maintenance Please continue all medications as directed. Follow heart healthy diet and move as often as possible. Labs look great! Recommend that you follow-up with your Orthopedic Specialist, re: R knee swelling. Follow-up here in 8 months, sooner if needed.  Essential hypertension, benign BP stable 158/82 HR 59 Continue Diltiazem 180mg  QD, Hyzaar  50/12.5mg  QD, and Toprol 25mg  QD She denies acute cardiac sx's  FOLLOW-UP:  Return in about 8 months (around 03/05/2018) for Regular Follow Up.

## 2017-07-03 ENCOUNTER — Ambulatory Visit (INDEPENDENT_AMBULATORY_CARE_PROVIDER_SITE_OTHER): Payer: Medicare Other | Admitting: Adult Health

## 2017-07-03 ENCOUNTER — Encounter: Payer: Self-pay | Admitting: Adult Health

## 2017-07-03 VITALS — BP 158/82 | HR 59 | Ht 61.0 in | Wt 132.4 lb

## 2017-07-03 DIAGNOSIS — R600 Localized edema: Secondary | ICD-10-CM

## 2017-07-03 DIAGNOSIS — Z0000 Encounter for general adult medical examination without abnormal findings: Secondary | ICD-10-CM

## 2017-07-03 DIAGNOSIS — I10 Essential (primary) hypertension: Secondary | ICD-10-CM

## 2017-07-03 DIAGNOSIS — N183 Chronic kidney disease, stage 3 unspecified: Secondary | ICD-10-CM

## 2017-07-03 MED ORDER — LOSARTAN POTASSIUM-HCTZ 50-12.5 MG PO TABS: 1.0000 | ORAL_TABLET | Freq: Every day | ORAL | 2 refills | Status: DC

## 2017-07-03 MED ORDER — METOPROLOL SUCCINATE ER 25 MG PO TB24
25.0000 mg | ORAL_TABLET | Freq: Every day | ORAL | 2 refills | Status: DC
Start: 2017-07-03 — End: 2018-03-05

## 2017-07-03 MED ORDER — PRAVASTATIN SODIUM 40 MG PO TABS: 40.0000 mg | ORAL_TABLET | Freq: Every evening | ORAL | 2 refills | Status: DC

## 2017-07-03 MED ORDER — DILTIAZEM HCL ER COATED BEADS 180 MG PO CP24: ORAL_CAPSULE | ORAL | 2 refills | Status: DC

## 2017-07-03 NOTE — Patient Instructions (Addendum)
Preventive Care for Adults, Female  A healthy lifestyle and preventive care can promote health and wellness. Preventive health guidelines for women include the following key practices.   A routine yearly physical is a good way to check with your health care provider about your health and preventive screening. It is a chance to share any concerns and updates on your health and to receive a thorough exam.   Visit your dentist for a routine exam and preventive care every 6 months. Brush your teeth twice a day and floss once a day. Good oral hygiene prevents tooth decay and gum disease.   The frequency of eye exams is based on your age, health, family medical history, use of contact lenses, and other factors. Follow your health care provider's recommendations for frequency of eye exams.   Eat a healthy diet. Foods like vegetables, fruits, whole grains, low-fat dairy products, and lean protein foods contain the nutrients you need without too many calories. Decrease your intake of foods high in solid fats, added sugars, and salt. Eat the right amount of calories for you.Get information about a proper diet from your health care provider, if necessary.   Regular physical exercise is one of the most important things you can do for your health. Most adults should get at least 150 minutes of moderate-intensity exercise (any activity that increases your heart rate and causes you to sweat) each week. In addition, most adults need muscle-strengthening exercises on 2 or more days a week.   Maintain a healthy weight. The body mass index (BMI) is a screening tool to identify possible weight problems. It provides an estimate of body fat based on height and weight. Your health care provider can find your BMI, and can help you achieve or maintain a healthy weight.For adults 20 years and older:   - A BMI below 18.5 is considered underweight.   - A BMI of 18.5 to 24.9 is normal.   - A BMI of 25 to 29.9 is  considered overweight.   - A BMI of 30 and above is considered obese.   Maintain normal blood lipids and cholesterol levels by exercising and minimizing your intake of trans and saturated fats.  Eat a balanced diet with plenty of fruit and vegetables. Blood tests for lipids and cholesterol should begin at age 20 and be repeated every 5 years minimum.  If your lipid or cholesterol levels are high, you are over 40, or you are at high risk for heart disease, you may need your cholesterol levels checked more frequently.Ongoing high lipid and cholesterol levels should be treated with medicines if diet and exercise are not working.   If you smoke, find out from your health care provider how to quit. If you do not use tobacco, do not start.   Lung cancer screening is recommended for adults aged 55-80 years who are at high risk for developing lung cancer because of a history of smoking. A yearly low-dose CT scan of the lungs is recommended for people who have at least a 30-pack-year history of smoking and are a current smoker or have quit within the past 15 years. A pack year of smoking is smoking an average of 1 pack of cigarettes a day for 1 year (for example: 1 pack a day for 30 years or 2 packs a day for 15 years). Yearly screening should continue until the smoker has stopped smoking for at least 15 years. Yearly screening should be stopped for people who develop a   health problem that would prevent them from having lung cancer treatment.   If you are pregnant, do not drink alcohol. If you are breastfeeding, be very cautious about drinking alcohol. If you are not pregnant and choose to drink alcohol, do not have more than 1 drink per day. One drink is considered to be 12 ounces (355 mL) of beer, 5 ounces (148 mL) of wine, or 1.5 ounces (44 mL) of liquor.   Avoid use of street drugs. Do not share needles with anyone. Ask for help if you need support or instructions about stopping the use of  drugs.   High blood pressure causes heart disease and increases the risk of stroke. Your blood pressure should be checked at least yearly.  Ongoing high blood pressure should be treated with medicines if weight loss and exercise do not work.   If you are 69-55 years old, ask your health care provider if you should take aspirin to prevent strokes.   Diabetes screening involves taking a blood sample to check your fasting blood sugar level. This should be done once every 3 years, after age 38, if you are within normal weight and without risk factors for diabetes. Testing should be considered at a younger age or be carried out more frequently if you are overweight and have at least 1 risk factor for diabetes.   Breast cancer screening is essential preventive care for women. You should practice "breast self-awareness."  This means understanding the normal appearance and feel of your breasts and may include breast self-examination.  Any changes detected, no matter how small, should be reported to a health care provider.  Women in their 80s and 30s should have a clinical breast exam (CBE) by a health care provider as part of a regular health exam every 1 to 3 years.  After age 66, women should have a CBE every year.  Starting at age 1, women should consider having a mammogram (breast X-ray test) every year.  Women who have a family history of breast cancer should talk to their health care provider about genetic screening.  Women at a high risk of breast cancer should talk to their health care providers about having an MRI and a mammogram every year.   -Breast cancer gene (BRCA)-related cancer risk assessment is recommended for women who have family members with BRCA-related cancers. BRCA-related cancers include breast, ovarian, tubal, and peritoneal cancers. Having family members with these cancers may be associated with an increased risk for harmful changes (mutations) in the breast cancer genes BRCA1 and  BRCA2. Results of the assessment will determine the need for genetic counseling and BRCA1 and BRCA2 testing.   The Pap test is a screening test for cervical cancer. A Pap test can show cell changes on the cervix that might become cervical cancer if left untreated. A Pap test is a procedure in which cells are obtained and examined from the lower end of the uterus (cervix).   - Women should have a Pap test starting at age 57.   - Between ages 90 and 70, Pap tests should be repeated every 2 years.   - Beginning at age 63, you should have a Pap test every 3 years as long as the past 3 Pap tests have been normal.   - Some women have medical problems that increase the chance of getting cervical cancer. Talk to your health care provider about these problems. It is especially important to talk to your health care provider if a  new problem develops soon after your last Pap test. In these cases, your health care provider may recommend more frequent screening and Pap tests.   - The above recommendations are the same for women who have or have not gotten the vaccine for human papillomavirus (HPV).   - If you had a hysterectomy for a problem that was not cancer or a condition that could lead to cancer, then you no longer need Pap tests. Even if you no longer need a Pap test, a regular exam is a good idea to make sure no other problems are starting.   - If you are between ages 36 and 66 years, and you have had normal Pap tests going back 10 years, you no longer need Pap tests. Even if you no longer need a Pap test, a regular exam is a good idea to make sure no other problems are starting.   - If you have had past treatment for cervical cancer or a condition that could lead to cancer, you need Pap tests and screening for cancer for at least 20 years after your treatment.   - If Pap tests have been discontinued, risk factors (such as a new sexual partner) need to be reassessed to determine if screening should  be resumed.   - The HPV test is an additional test that may be used for cervical cancer screening. The HPV test looks for the virus that can cause the cell changes on the cervix. The cells collected during the Pap test can be tested for HPV. The HPV test could be used to screen women aged 70 years and older, and should be used in women of any age who have unclear Pap test results. After the age of 67, women should have HPV testing at the same frequency as a Pap test.   Colorectal cancer can be detected and often prevented. Most routine colorectal cancer screening begins at the age of 57 years and continues through age 26 years. However, your health care provider may recommend screening at an earlier age if you have risk factors for colon cancer. On a yearly basis, your health care provider may provide home test kits to check for hidden blood in the stool.  Use of a small camera at the end of a tube, to directly examine the colon (sigmoidoscopy or colonoscopy), can detect the earliest forms of colorectal cancer. Talk to your health care provider about this at age 23, when routine screening begins. Direct exam of the colon should be repeated every 5 -10 years through age 49 years, unless early forms of pre-cancerous polyps or small growths are found.   People who are at an increased risk for hepatitis B should be screened for this virus. You are considered at high risk for hepatitis B if:  -You were born in a country where hepatitis B occurs often. Talk with your health care provider about which countries are considered high risk.  - Your parents were born in a high-risk country and you have not received a shot to protect against hepatitis B (hepatitis B vaccine).  - You have HIV or AIDS.  - You use needles to inject street drugs.  - You live with, or have sex with, someone who has Hepatitis B.  - You get hemodialysis treatment.  - You take certain medicines for conditions like cancer, organ  transplantation, and autoimmune conditions.   Hepatitis C blood testing is recommended for all people born from [birth year] through 1965 and any individual  with known risks for hepatitis C.   Practice safe sex. Use condoms and avoid high-risk sexual practices to reduce the spread of sexually transmitted infections (STIs). STIs include gonorrhea, chlamydia, syphilis, trichomonas, herpes, HPV, and human immunodeficiency virus (HIV). Herpes, HIV, and HPV are viral illnesses that have no cure. They can result in disability, cancer, and death. Sexually active women aged 25 years and younger should be checked for chlamydia. Older women with new or multiple partners should also be tested for chlamydia. Testing for other STIs is recommended if you are sexually active and at increased risk.   Osteoporosis is a disease in which the bones lose minerals and strength with aging. This can result in serious bone fractures or breaks. The risk of osteoporosis can be identified using a bone density scan. Women ages 65 years and over and women at risk for fractures or osteoporosis should discuss screening with their health care providers. Ask your health care provider whether you should take a calcium supplement or vitamin D to There are also several preventive steps women can take to avoid osteoporosis and resulting fractures or to keep osteoporosis from worsening. -->Recommendations include:  Eat a balanced diet high in fruits, vegetables, calcium, and vitamins.  Get enough calcium. The recommended total intake of is 1,200 mg daily; for best absorption, if taking supplements, divide doses into 250-500 mg doses throughout the day. Of the two types of calcium, calcium carbonate is best absorbed when taken with food but calcium citrate can be taken on an empty stomach.  Get enough vitamin D. NAMS and the National Osteoporosis Foundation recommend at least 1,000 IU per day for women age 50 and over who are at risk of vitamin D  deficiency. Vitamin D deficiency can be caused by inadequate sun exposure (for example, those who live in northern latitudes).  Avoid alcohol and smoking. Heavy alcohol intake (more than 7 drinks per week) increases the risk of falls and hip fracture and women smokers tend to lose bone more rapidly and have lower bone mass than nonsmokers. Stopping smoking is one of the most important changes women can make to improve their health and decrease risk for disease.  Be physically active every day. Weight-bearing exercise (for example, fast walking, hiking, jogging, and weight training) may strengthen bones or slow the rate of bone loss that comes with aging. Balancing and muscle-strengthening exercises can reduce the risk of falling and fracture.  Consider therapeutic medications. Currently, several types of effective drugs are available. Healthcare providers can recommend the type most appropriate for each woman.  Eliminate environmental factors that may contribute to accidents. Falls cause nearly 90% of all osteoporotic fractures, so reducing this risk is an important bone-health strategy. Measures include ample lighting, removing obstructions to walking, using nonskid rugs on floors, and placing mats and/or grab bars in showers.  Be aware of medication side effects. Some common medicines make bones weaker. These include a type of steroid drug called glucocorticoids used for arthritis and asthma, some antiseizure drugs, certain sleeping pills, treatments for endometriosis, and some cancer drugs. An overactive thyroid gland or using too much thyroid hormone for an underactive thyroid can also be a problem. If you are taking these medicines, talk to your doctor about what you can do to help protect your bones.reduce the rate of osteoporosis.    Menopause can be associated with physical symptoms and risks. Hormone replacement therapy is available to decrease symptoms and risks. You should talk to your  health care provider   about whether hormone replacement therapy is right for you.   Use sunscreen. Apply sunscreen liberally and repeatedly throughout the day. You should seek shade when your shadow is shorter than you. Protect yourself by wearing long sleeves, pants, a wide-brimmed hat, and sunglasses year round, whenever you are outdoors.   Once a month, do a whole body skin exam, using a mirror to look at the skin on your back. Tell your health care provider of new moles, moles that have irregular borders, moles that are larger than a pencil eraser, or moles that have changed in shape or color.   -Stay current with required vaccines (immunizations).   Influenza vaccine. All adults should be immunized every year.  Tetanus, diphtheria, and acellular pertussis (Td, Tdap) vaccine. Pregnant women should receive 1 dose of Tdap vaccine during each pregnancy. The dose should be obtained regardless of the length of time since the last dose. Immunization is preferred during the 27th 36th week of gestation. An adult who has not previously received Tdap or who does not know her vaccine status should receive 1 dose of Tdap. This initial dose should be followed by tetanus and diphtheria toxoids (Td) booster doses every 10 years. Adults with an unknown or incomplete history of completing a 3-dose immunization series with Td-containing vaccines should begin or complete a primary immunization series including a Tdap dose. Adults should receive a Td booster every 10 years.  Varicella vaccine. An adult without evidence of immunity to varicella should receive 2 doses or a second dose if she has previously received 1 dose. Pregnant females who do not have evidence of immunity should receive the first dose after pregnancy. This first dose should be obtained before leaving the health care facility. The second dose should be obtained 4 8 weeks after the first dose.  Human papillomavirus (HPV) vaccine. Females aged 13 26  years who have not received the vaccine previously should obtain the 3-dose series. The vaccine is not recommended for use in pregnant females. However, pregnancy testing is not needed before receiving a dose. If a female is found to be pregnant after receiving a dose, no treatment is needed. In that case, the remaining doses should be delayed until after the pregnancy. Immunization is recommended for any person with an immunocompromised condition through the age of 26 years if she did not get any or all doses earlier. During the 3-dose series, the second dose should be obtained 4 8 weeks after the first dose. The third dose should be obtained 24 weeks after the first dose and 16 weeks after the second dose.  Zoster vaccine. One dose is recommended for adults aged 60 years or older unless certain conditions are present.  Measles, mumps, and rubella (MMR) vaccine. Adults born before 1957 generally are considered immune to measles and mumps. Adults born in 1957 or later should have 1 or more doses of MMR vaccine unless there is a contraindication to the vaccine or there is laboratory evidence of immunity to each of the three diseases. A routine second dose of MMR vaccine should be obtained at least 28 days after the first dose for students attending postsecondary schools, health care workers, or international travelers. People who received inactivated measles vaccine or an unknown type of measles vaccine during 1963 1967 should receive 2 doses of MMR vaccine. People who received inactivated mumps vaccine or an unknown type of mumps vaccine before 1979 and are at high risk for mumps infection should consider immunization with 2 doses of   MMR vaccine. For females of childbearing age, rubella immunity should be determined. If there is no evidence of immunity, females who are not pregnant should be vaccinated. If there is no evidence of immunity, females who are pregnant should delay immunization until after pregnancy.  Unvaccinated health care workers born before [birth year] who lack laboratory evidence of measles, mumps, or rubella immunity or laboratory confirmation of disease should consider measles and mumps immunization with 2 doses of MMR vaccine or rubella immunization with 1 dose of MMR vaccine.  Pneumococcal 13-valent conjugate (PCV13) vaccine. When indicated, a person who is uncertain of her immunization history and has no record of immunization should receive the PCV13 vaccine. An adult aged 54 years or older who has certain medical conditions and has not been previously immunized should receive 1 dose of PCV13 vaccine. This PCV13 should be followed with a dose of pneumococcal polysaccharide (PPSV23) vaccine. The PPSV23 vaccine dose should be obtained at least 8 weeks after the dose of PCV13 vaccine. An adult aged 58 years or older who has certain medical conditions and previously received 1 or more doses of PPSV23 vaccine should receive 1 dose of PCV13. The PCV13 vaccine dose should be obtained 1 or more years after the last PPSV23 vaccine dose.  Pneumococcal polysaccharide (PPSV23) vaccine. When PCV13 is also indicated, PCV13 should be obtained first. All adults aged 58 years and older should be immunized. An adult younger than age 65 years who has certain medical conditions should be immunized. Any person who resides in a nursing home or long-term care facility should be immunized. An adult smoker should be immunized. People with an immunocompromised condition and certain other conditions should receive both PCV13 and PPSV23 vaccines. People with human immunodeficiency virus (HIV) infection should be immunized as soon as possible after diagnosis. Immunization during chemotherapy or radiation therapy should be avoided. Routine use of PPSV23 vaccine is not recommended for American Indians, [place] Natives, or people younger than 65 years unless there are medical conditions that require PPSV23 vaccine. When indicated,  people who have unknown immunization and have no record of immunization should receive PPSV23 vaccine. One-time revaccination 5 years after the first dose of PPSV23 is recommended for people aged 70 64 years who have chronic kidney failure, nephrotic syndrome, asplenia, or immunocompromised conditions. People who received 1 2 doses of PPSV23 before age 32 years should receive another dose of PPSV23 vaccine at age 96 years or later if at least 5 years have passed since the previous dose. Doses of PPSV23 are not needed for people immunized with PPSV23 at or after age 55 years.  Meningococcal vaccine. Adults with asplenia or persistent complement component deficiencies should receive 2 doses of quadrivalent meningococcal conjugate (MenACWY-D) vaccine. The doses should be obtained at least 2 months apart. Microbiologists working with certain meningococcal bacteria, [employer] recruits, people at risk during an outbreak, and people who travel to or live in countries with a high rate of meningitis should be immunized. A first-year college student up through age 58 years who is living in a residence hall should receive a dose if she did not receive a dose on or after her 16th birthday. Adults who have certain high-risk conditions should receive one or more doses of vaccine.  Hepatitis A vaccine. Adults who wish to be protected from this disease, have certain high-risk conditions, work with hepatitis A-infected animals, work in hepatitis A research labs, or travel to or work in countries with a high rate of hepatitis A should be  immunized. Adults who were previously unvaccinated and who anticipate close contact with an international adoptee during the first 60 days after arrival in the [place] from a country with a high rate of hepatitis A should be immunized.  Hepatitis B vaccine.  Adults who wish to be protected from this disease, have certain high-risk conditions, may be exposed to blood or other infectious  body fluids, are household contacts or sex partners of hepatitis B positive people, are clients or workers in certain care facilities, or travel to or work in countries with a high rate of hepatitis B should be immunized.  Haemophilus influenzae type b (Hib) vaccine. A previously unvaccinated person with asplenia or sickle cell disease or having a scheduled splenectomy should receive 1 dose of Hib vaccine. Regardless of previous immunization, a recipient of a hematopoietic stem cell transplant should receive a 3-dose series 6 12 months after her successful transplant. Hib vaccine is not recommended for adults with HIV infection.  Preventive Services / Frequency Ages 6 to 39years  Blood pressure check.** / Every 1 to 2 years.  Lipid and cholesterol check.** / Every 5 years beginning at age 39.  Clinical breast exam.** / Every 3 years for women in their 61s and 62s.  BRCA-related cancer risk assessment.** / For women who have family members with a BRCA-related cancer (breast, ovarian, tubal, or peritoneal cancers).  Pap test.** / Every 2 years from ages 47 through 85. Every 3 years starting at age 34 through age 12 or 74 with a history of 3 consecutive normal Pap tests.  HPV screening.** / Every 3 years from ages 46 through ages 43 to 54 with a history of 3 consecutive normal Pap tests.  Hepatitis C blood test.** / For any individual with known risks for hepatitis C.  Skin self-exam. / Monthly.  Influenza vaccine. / Every year.  Tetanus, diphtheria, and acellular pertussis (Tdap, Td) vaccine.** / Consult your health care provider. Pregnant women should receive 1 dose of Tdap vaccine during each pregnancy. 1 dose of Td every 10 years.  Varicella vaccine.** / Consult your health care provider. Pregnant females who do not have evidence of immunity should receive the first dose after pregnancy.  HPV vaccine. / 3 doses over 6 months, if 64 and younger. The vaccine is not recommended for use in  pregnant females. However, pregnancy testing is not needed before receiving a dose.  Measles, mumps, rubella (MMR) vaccine.** / You need at least 1 dose of MMR if you were born in 1957 or later. You may also need a 2nd dose. For females of childbearing age, rubella immunity should be determined. If there is no evidence of immunity, females who are not pregnant should be vaccinated. If there is no evidence of immunity, females who are pregnant should delay immunization until after pregnancy.  Pneumococcal 13-valent conjugate (PCV13) vaccine.** / Consult your health care provider.  Pneumococcal polysaccharide (PPSV23) vaccine.** / 1 to 2 doses if you smoke cigarettes or if you have certain conditions.  Meningococcal vaccine.** / 1 dose if you are age 71 to 37 years and a Market researcher living in a residence hall, or have one of several medical conditions, you need to get vaccinated against meningococcal disease. You may also need additional booster doses.  Hepatitis A vaccine.** / Consult your health care provider.  Hepatitis B vaccine.** / Consult your health care provider.  Haemophilus influenzae type b (Hib) vaccine.** / Consult your health care provider.  Ages 55 to 64years  Blood pressure check.** / Every 1 to 2 years.  Lipid and cholesterol check.** / Every 5 years beginning at age 20 years.  Lung cancer screening. / Every year if you are aged 55 80 years and have a 30-pack-year history of smoking and currently smoke or have quit within the past 15 years. Yearly screening is stopped once you have quit smoking for at least 15 years or develop a health problem that would prevent you from having lung cancer treatment.  Clinical breast exam.** / Every year after age 40 years.  BRCA-related cancer risk assessment.** / For women who have family members with a BRCA-related cancer (breast, ovarian, tubal, or peritoneal cancers).  Mammogram.** / Every year beginning at age 40  years and continuing for as long as you are in good health. Consult with your health care provider.  Pap test.** / Every 3 years starting at age 30 years through age 65 or 70 years with a history of 3 consecutive normal Pap tests.  HPV screening.** / Every 3 years from ages 30 years through ages 65 to 70 years with a history of 3 consecutive normal Pap tests.  Fecal occult blood test (FOBT) of stool. / Every year beginning at age 50 years and continuing until age 75 years. You may not need to do this test if you get a colonoscopy every 10 years.  Flexible sigmoidoscopy or colonoscopy.** / Every 5 years for a flexible sigmoidoscopy or every 10 years for a colonoscopy beginning at age 50 years and continuing until age 75 years.  Hepatitis C blood test.** / For all people born from 1945 through 1965 and any individual with known risks for hepatitis C.  Skin self-exam. / Monthly.  Influenza vaccine. / Every year.  Tetanus, diphtheria, and acellular pertussis (Tdap/Td) vaccine.** / Consult your health care provider. Pregnant women should receive 1 dose of Tdap vaccine during each pregnancy. 1 dose of Td every 10 years.  Varicella vaccine.** / Consult your health care provider. Pregnant females who do not have evidence of immunity should receive the first dose after pregnancy.  Zoster vaccine.** / 1 dose for adults aged 60 years or older.  Measles, mumps, rubella (MMR) vaccine.** / You need at least 1 dose of MMR if you were born in 1957 or later. You may also need a 2nd dose. For females of childbearing age, rubella immunity should be determined. If there is no evidence of immunity, females who are not pregnant should be vaccinated. If there is no evidence of immunity, females who are pregnant should delay immunization until after pregnancy.  Pneumococcal 13-valent conjugate (PCV13) vaccine.** / Consult your health care provider.  Pneumococcal polysaccharide (PPSV23) vaccine.** / 1 to 2 doses if  you smoke cigarettes or if you have certain conditions.  Meningococcal vaccine.** / Consult your health care provider.  Hepatitis A vaccine.** / Consult your health care provider.  Hepatitis B vaccine.** / Consult your health care provider.  Haemophilus influenzae type b (Hib) vaccine.** / Consult your health care provider.  Ages 65 years and over  Blood pressure check.** / Every 1 to 2 years.  Lipid and cholesterol check.** / Every 5 years beginning at age 20 years.  Lung cancer screening. / Every year if you are aged 55 80 years and have a 30-pack-year history of smoking and currently smoke or have quit within the past 15 years. Yearly screening is stopped once you have quit smoking for at least 15 years or develop a health problem that   would prevent you from having lung cancer treatment.  Clinical breast exam.** / Every year after age 103 years.  BRCA-related cancer risk assessment.** / For women who have family members with a BRCA-related cancer (breast, ovarian, tubal, or peritoneal cancers).  Mammogram.** / Every year beginning at age 36 years and continuing for as long as you are in good health. Consult with your health care provider.  Pap test.** / Every 3 years starting at age 5 years through age 85 or 10 years with 3 consecutive normal Pap tests. Testing can be stopped between 65 and 70 years with 3 consecutive normal Pap tests and no abnormal Pap or HPV tests in the past 10 years.  HPV screening.** / Every 3 years from ages 93 years through ages 70 or 45 years with a history of 3 consecutive normal Pap tests. Testing can be stopped between 65 and 70 years with 3 consecutive normal Pap tests and no abnormal Pap or HPV tests in the past 10 years.  Fecal occult blood test (FOBT) of stool. / Every year beginning at age 8 years and continuing until age 45 years. You may not need to do this test if you get a colonoscopy every 10 years.  Flexible sigmoidoscopy or colonoscopy.** /  Every 5 years for a flexible sigmoidoscopy or every 10 years for a colonoscopy beginning at age 69 years and continuing until age 68 years.  Hepatitis C blood test.** / For all people born from [birth year] through 1963 and any individual with known risks for hepatitis C.  Osteoporosis screening.** / A one-time screening for women ages 7 years and over and women at risk for fractures or osteoporosis.  Skin self-exam. / Monthly.  Influenza vaccine. / Every year.  Tetanus, diphtheria, and acellular pertussis (Tdap/Td) vaccine.** / 1 dose of Td every 10 years.  Varicella vaccine.** / Consult your health care provider.  Zoster vaccine.** / 1 dose for adults aged 5 years or older.  Pneumococcal 13-valent conjugate (PCV13) vaccine.** / Consult your health care provider.  Pneumococcal polysaccharide (PPSV23) vaccine.** / 1 dose for all adults aged 74 years and older.  Meningococcal vaccine.** / Consult your health care provider.  Hepatitis A vaccine.** / Consult your health care provider.  Hepatitis B vaccine.** / Consult your health care provider.  Haemophilus influenzae type b (Hib) vaccine.** / Consult your health care provider. ** Family history and personal history of risk and conditions may change your health care provider's recommendations. Document Released: 04/02/2001 Document Revised: 11/25/2012  [facility] Patient Information 2014 [place], [place].   EXERCISE AND DIET:  We recommended that you start or continue a regular exercise program for good health. Regular exercise means any activity that makes your heart beat faster and makes you sweat.  We recommend exercising at least 30 minutes per day at least 3 days a week, preferably 5.  We also recommend a diet low in fat and sugar / carbohydrates.  Inactivity, poor dietary choices and obesity can cause diabetes, heart attack, stroke, and kidney damage, among others.     ALCOHOL AND SMOKING:  Women should limit their alcohol intake to no  more than 7 drinks/beers/glasses of wine (combined, not each!) per week. Moderation of alcohol intake to this level decreases your risk of breast cancer and liver damage.  ( And of course, no recreational drugs are part of a healthy lifestyle.)  Also, you should not be smoking at all or even being exposed to second hand smoke. Most people know smoking can  cause cancer, and various heart and lung diseases, but did you know it also contributes to weakening of your bones?  Aging of your skin?  Yellowing of your teeth and nails?   CALCIUM AND VITAMIN D:  Adequate intake of calcium and Vitamin D are recommended.  The recommendations for exact amounts of these supplements seem to change often, but generally speaking 600 mg of calcium (either carbonate or citrate) and 800 units of Vitamin D per day seems prudent. Certain women may benefit from higher intake of Vitamin D.  If you are among these women, your doctor will have told you during your visit.     PAP SMEARS:  Pap smears, to check for cervical cancer or precancers,  have traditionally been done yearly, although recent scientific advances have shown that most women can have pap smears less often.  However, every woman still should have a physical exam from her gynecologist or primary care physician every year. It will include a breast check, inspection of the vulva and vagina to check for abnormal growths or skin changes, a visual exam of the cervix, and then an exam to evaluate the size and shape of the uterus and ovaries.  And after 82 years of age, a rectal exam is indicated to check for rectal cancers. We will also provide age appropriate advice regarding health maintenance, like when you should have certain vaccines, screening for sexually transmitted diseases, bone density testing, colonoscopy, mammograms, etc.    MAMMOGRAMS:  All women over 41 years old should have a yearly mammogram. Many facilities now offer a "3D" mammogram, which may cost  around $50 extra out of pocket. If possible,  we recommend you accept the option to have the 3D mammogram performed.  It both reduces the number of women who will be called back for extra views which then turn out to be normal, and it is better than the routine mammogram at detecting truly abnormal areas.     COLONOSCOPY:  Colonoscopy to screen for colon cancer is recommended for all women at age 67.  We know, you hate the idea of the prep.  We agree, BUT, having colon cancer and not knowing it is worse!!  Colon cancer so often starts as a polyp that can be seen and removed at colonscopy, which can quite literally save your life!  And if your first colonoscopy is normal and you have no family history of colon cancer, most women don't have to have it again for 10 years.  Once every ten years, you can do something that may end up saving your life, right?  We will be happy to help you get it scheduled when you are ready.  Be sure to check your insurance coverage so you understand how much it will cost.  It may be covered as a preventative service at no cost, but you should check your particular policy.    Please continue all medications as directed. Follow heart healthy diet and move as often as possible. Labs look great! Recommend that you follow-up with your Orthopedic Specialist, re: R knee swelling. Follow-up here in 8 months, sooner if needed. NICE TO SEE YOU!

## 2017-07-03 NOTE — Assessment & Plan Note (Signed)
Estimated Creatinine Clearance: 35 mL/min (by C-G formula based on SCr of 0.87 mg/dL).  GFR 58 No medication adjustment needed

## 2017-07-03 NOTE — Assessment & Plan Note (Signed)
BP stable 158/82 HR 59 Continue Diltiazem 180mg  QD, Hyzaar 50/12.5mg  QD, and Toprol 25mg  QD She denies acute cardiac sx's

## 2017-07-03 NOTE — Assessment & Plan Note (Signed)
Please continue all medications as directed. Follow heart healthy diet and move as often as possible. Labs look great! Recommend that you follow-up with your Orthopedic Specialist, re: R knee swelling. Follow-up here in 8 months, sooner if needed.

## 2017-07-31 DIAGNOSIS — R040 Epistaxis: Secondary | ICD-10-CM | POA: Diagnosis not present

## 2017-08-22 DIAGNOSIS — R040 Epistaxis: Secondary | ICD-10-CM | POA: Diagnosis not present

## 2017-09-01 ENCOUNTER — Other Ambulatory Visit: Payer: Self-pay

## 2017-09-01 ENCOUNTER — Ambulatory Visit (INDEPENDENT_AMBULATORY_CARE_PROVIDER_SITE_OTHER): Payer: Medicare Other | Admitting: Adult Health

## 2017-09-01 ENCOUNTER — Encounter (HOSPITAL_BASED_OUTPATIENT_CLINIC_OR_DEPARTMENT_OTHER): Payer: Self-pay

## 2017-09-01 ENCOUNTER — Emergency Department (HOSPITAL_BASED_OUTPATIENT_CLINIC_OR_DEPARTMENT_OTHER): Payer: Medicare Other

## 2017-09-01 ENCOUNTER — Encounter: Payer: Self-pay | Admitting: Adult Health

## 2017-09-01 ENCOUNTER — Emergency Department (HOSPITAL_BASED_OUTPATIENT_CLINIC_OR_DEPARTMENT_OTHER)
Admission: EM | Admit: 2017-09-01 | Discharge: 2017-09-01 | Disposition: A | Discharge status: Home / Self Care | Payer: Medicare Other | Attending: Emergency Medicine | Admitting: Emergency Medicine

## 2017-09-01 VITALS — BP 130/70 | HR 59 | Ht 61.0 in | Wt 133.7 lb

## 2017-09-01 DIAGNOSIS — N183 Chronic kidney disease, stage 3 (moderate): Secondary | ICD-10-CM | POA: Diagnosis not present

## 2017-09-01 DIAGNOSIS — R8290 Unspecified abnormal findings in urine: Secondary | ICD-10-CM

## 2017-09-01 DIAGNOSIS — F0390 Unspecified dementia without behavioral disturbance: Secondary | ICD-10-CM | POA: Diagnosis not present

## 2017-09-01 DIAGNOSIS — R0789 Other chest pain: Secondary | ICD-10-CM

## 2017-09-01 DIAGNOSIS — Z79899 Other long term (current) drug therapy: Secondary | ICD-10-CM | POA: Diagnosis not present

## 2017-09-01 DIAGNOSIS — R079 Chest pain, unspecified: Secondary | ICD-10-CM | POA: Diagnosis not present

## 2017-09-01 DIAGNOSIS — I129 Hypertensive chronic kidney disease with stage 1 through stage 4 chronic kidney disease, or unspecified chronic kidney disease: Secondary | ICD-10-CM | POA: Diagnosis not present

## 2017-09-01 DIAGNOSIS — R918 Other nonspecific abnormal finding of lung field: Secondary | ICD-10-CM | POA: Insufficient documentation

## 2017-09-01 DIAGNOSIS — R911 Solitary pulmonary nodule: Secondary | ICD-10-CM | POA: Diagnosis not present

## 2017-09-01 DIAGNOSIS — R1032 Left lower quadrant pain: Secondary | ICD-10-CM | POA: Diagnosis not present

## 2017-09-01 LAB — BASIC METABOLIC PANEL
Anion gap: 5 (ref 5–15)
BUN: 17 mg/dL (ref 8–23)
CO2: 25 mmol/L (ref 22–32)
Calcium: 8.8 mg/dL — ABNORMAL LOW (ref 8.9–10.3)
Chloride: 108 mmol/L (ref 98–111)
Creatinine, Ser: 0.85 mg/dL (ref 0.44–1.00)
GFR calc Af Amer: >60 mL/min (ref >60)
GFR, EST NON AFRICAN AMERICAN: 58 mL/min — AB (ref >60)
GLUCOSE: 100 mg/dL — AB (ref 70–99)
POTASSIUM: 3.7 mmol/L (ref 3.5–5.1)
Sodium: 138 mmol/L (ref 135–145)

## 2017-09-01 LAB — URINALYSIS, ROUTINE W REFLEX MICROSCOPIC
BILIRUBIN URINE: NEGATIVE
GLUCOSE, UA: NEGATIVE mg/dL
HGB URINE DIPSTICK: NEGATIVE
Ketones, ur: NEGATIVE mg/dL
Nitrite: NEGATIVE
PH: 6.5 (ref 5.0–8.0)
Protein, ur: NEGATIVE mg/dL
SPECIFIC GRAVITY, URINE: 1.01 (ref 1.005–1.030)

## 2017-09-01 LAB — CBC
HEMATOCRIT: 34.6 % — AB (ref 36.0–46.0)
HEMOGLOBIN: 11.7 g/dL — AB (ref 12.0–15.0)
MCH: 32.3 pg (ref 26.0–34.0)
MCHC: 33.8 g/dL (ref 30.0–36.0)
MCV: 95.6 fL (ref 78.0–100.0)
Platelets: 228 10*3/uL (ref 150–400)
RBC: 3.62 MIL/uL — ABNORMAL LOW (ref 3.87–5.11)
RDW: 12.5 % (ref 11.5–15.5)
WBC: 6.8 10*3/uL (ref 4.0–10.5)

## 2017-09-01 LAB — TROPONIN I: Troponin I: <0.03 ng/mL (ref <0.03)

## 2017-09-01 LAB — POCT URINALYSIS DIPSTICK
Bilirubin, UA: NEGATIVE
GLUCOSE UA: NEGATIVE
Ketones, UA: NEGATIVE
Nitrite, UA: NEGATIVE
Protein, UA: NEGATIVE
Spec Grav, UA: 1.015 (ref 1.010–1.025)
Urobilinogen, UA: 1 E.U./dL
pH, UA: 7 (ref 5.0–8.0)

## 2017-09-01 LAB — URINALYSIS, MICROSCOPIC (REFLEX)

## 2017-09-01 MED ORDER — IOPAMIDOL (ISOVUE-370) INJECTION 76%
100.0000 mL | Freq: Once | INTRAVENOUS | Status: AC | PRN
  Administered 2017-09-01: 100 mL via INTRAVENOUS

## 2017-09-01 NOTE — Addendum Note (Signed)
Addended by: Fonnie Mu on: 09/01/2017 11:38 AM   Modules accepted: Orders

## 2017-09-01 NOTE — ED Notes (Signed)
Patient transported to X-ray 

## 2017-09-01 NOTE — Assessment & Plan Note (Signed)
EKG- SB No ST changes noted No previous EKG tracing in system to compare with Due to length and nature of CP- advised to be seen immediately at nearest ED. Daughter at [facility] and will drive her to ED.

## 2017-09-01 NOTE — Progress Notes (Signed)
Subjective:    Patient ID: [name], female    DOB: 12-05-1923, 84 y.o.   MRN: [id number]  HPI:  [name] presents with L sided chest pain with radiation to L thoracic back that began Saturday. Pain has been intermittent and will worsen at night, with addition of generalized restlessness. She is unable to rate pain, however describes it as pressure/aching. She also reports increase in fatigue and shortness of breath the last 2 days. She states "it feels like my chest wants to burst open". She denies nausea/votming She denies diaphoresis  She denies previous hx of MI, however has hx of cardiac dysrhythmia- last seen by Dr. Rae Lips 2008 She denies previous episodes of angina, however has had palpitations in the past. Myoview Stress Test 11/11/2003 Daughter at [facility] during [place]- history obtained from pt and pt's daughter  She denies tobacco/ETOH use  Patient Care Team    Relationship Specialty Notifications Start End  Mina Marble D, NP PCP - General Family Medicine  03/13/16   Magnus Sinning, MD Consulting Physician Physical Medicine and Rehabilitation  03/13/16   Bo Merino, MD Consulting Physician Rheumatology  03/13/16   Irene Shipper, MD Consulting Physician Gastroenterology  03/13/16   Webb Laws, [place] Referring Physician Optometry  03/13/16     Patient Active Problem List   Diagnosis Date Noted  . Abdominal pain, acute, left lower quadrant 09/01/2017  . Other chest pain 09/01/2017  . CKD (chronic kidney disease), symptom management only, stage 3 (moderate) ([place]) 07/03/2017  . Cognitive changes 03/05/2017  . Healthcare maintenance 03/05/2017  . Hypertension 03/05/2017  . Cervicalgia 10/10/2016  . Lower extremity edema 10/03/2016  . Dyspnea on exertion 10/03/2016  . Right arm pain 09/04/2016  . Migraine 09/04/2016  . Numerous moles 09/04/2016  . Frequent headaches 09/04/2016  . Chronic insomnia 05/11/2015  . Vitamin D deficiency 05/17/2014  . Counseling  regarding end of life decision making 05/03/2014  . Mild dementia 05/03/2014  . Lumbago 05/11/2012  . Vitamin B 12 deficiency 05/11/2012  . Dysphagia, pharyngoesophageal phase 12/17/2010  . Esophageal reflux 12/17/2010  . Abdominal hernia 12/17/2010  . Anemia, iron deficiency 12/06/2010  . HYPERCHOLESTEROLEMIA, MILD 11/19/2008  . Venous (peripheral) insufficiency 11/14/2007  . Essential hypertension, benign 05/14/2007  . Arrhythmia 05/14/2007  . CONSTIPATION, CHRONIC 05/14/2007  . Osteoporosis 05/14/2007  . Benign essential tremor 05/14/2007  . HIATAL HERNIA 12/18/2006  . DEGENERATIVE JOINT DISEASE, GENERALIZED 12/18/2006     Past Medical History:  Diagnosis Date  . Abnormal involuntary movements(781.0)   . Anemia   . Anxiety state, unspecified   . Blood transfusion   . Cardiac dysrhythmia, unspecified   . Chronic constipation   . DJD (degenerative joint disease)   . Esophageal stricture   . Generalized osteoarthrosis, unspecified site   . GERD (gastroesophageal reflux disease)   . Hiatal hernia   . Hypertension   . Osteoporosis   . Pelvic adhesions   . Postgastrectomy syndrome   . Pure hypercholesterolemia   . Tortuous colon   . Urinary tract infection, site not specified   . Venous insufficiency   . Vitamin B12 deficiency      Past Surgical History:  Procedure Laterality Date  . CHOLECYSTECTOMY    . COLONOSCOPY    . ESOPHAGUS SURGERY  2003  . mole biopsy- left neck area Left   . NISSEN FUNDOPLICATION    . TOTAL ABDOMINAL HYSTERECTOMY     DUB,   . UPPER GASTROINTESTINAL ENDOSCOPY  Family History  Problem Relation Age of Onset  . Cancer Mother        unknown type, most likely colon  . Heart disease Brother   . Arthritis Father   . Heart disease Sister      Social History   Substance and Sexual Activity  Drug Use No     Social History   Substance and Sexual Activity  Alcohol Use No  . Alcohol/week: 0.0 oz     Social History    Tobacco Use  Smoking Status Never Smoker  Smokeless Tobacco Never Used     Outpatient Encounter Medications as of 09/01/2017  Medication Sig  . acetaminophen (TYLENOL) 500 MG tablet Take 1,000 mg by mouth as needed.   . AMBULATORY NON FORMULARY MEDICATION Medication Name: GI Cocktail with 20cc Maalox, 20cc Viscous Lidocaine 2%, take 30cc dose q8hr as needed for GI upset.  . Calcium-Vitamin D (CALTRATE 600 PLUS-VIT D PO) Take 1 tablet by mouth daily.  Marland Kitchen diltiazem (CARDIZEM CD) 180 MG 24 hr capsule TAKE 1 CAPSULE BY MOUTH EVERY DAY.  Marland Kitchen losartan-hydrochlorothiazide (HYZAAR) 50-12.5 MG tablet Take 1 tablet by mouth daily.  Marland Kitchen MELATONIN PO Take by mouth at bedtime.  . metoprolol succinate (TOPROL-XL) 25 MG 24 hr tablet Take 1 tablet (25 mg total) by mouth daily. Take with or immediately following a meal.  . Multiple Vitamins-Minerals (PRESERVISION AREDS 2 PO) Take 2 capsules by mouth daily.    Marland Kitchen omeprazole (PRILOSEC) 20 MG capsule TAKE 1 CAPSULE BY MOUTH DAILY.  Marland Kitchen polyethylene glycol (MIRALAX / GLYCOLAX) packet Take 17 g by mouth daily.  . pravastatin (PRAVACHOL) 40 MG tablet Take 1 tablet (40 mg total) by mouth every evening.  . [DISCONTINUED] diltiazem (DILACOR XR) 180 MG 24 hr capsule Take 1 capsule (180 mg total) by mouth daily.   No facility-administered encounter medications on file as of 09/01/2017.     Allergies: Patient has no known allergies.  Body mass index is 25.26 kg/m.  Blood pressure 130/70, pulse (!) 59, height 5\' 1"  (1.549 m), weight 133 lb 11.2 oz (60.6 kg), SpO2 97 %.  Review of Systems  Constitutional: Positive for activity change and fatigue. Negative for appetite change, chills, diaphoresis, fever and unexpected weight change.  Respiratory: Positive for chest tightness and shortness of breath. Negative for cough, wheezing and stridor.   Cardiovascular: Positive for chest pain. Negative for palpitations and leg swelling.  Gastrointestinal: Positive for abdominal  pain. Negative for abdominal distention, blood in stool, constipation, diarrhea, nausea and vomiting.  Genitourinary: Negative for difficulty urinating, flank pain and hematuria.  Musculoskeletal: Positive for arthralgias, back pain, gait problem, joint swelling, myalgias, neck pain and neck stiffness.  Neurological: Negative for dizziness and headaches.  Hematological: Does not bruise/bleed easily.  Psychiatric/Behavioral: Positive for sleep disturbance. Negative for self-injury and suicidal ideas.      Objective:   Physical Exam  Constitutional: She is oriented to person, place, and time. She appears well-developed and well-nourished.  Non-toxic appearance. She does not appear ill. No distress.  HENT:  Head: Normocephalic and atraumatic.  Eyes: Pupils are equal, round, and reactive to light. EOM are normal.  Neck: Normal range of motion. Neck supple.  Cardiovascular: Regular rhythm, intact distal pulses and normal pulses. Bradycardia present.  No murmur heard. Pulmonary/Chest: Effort normal and breath sounds normal. She has no decreased breath sounds. She has no wheezes. She has no rhonchi. She has no rales.  Abdominal: Soft. Bowel sounds are normal. She exhibits no  distension. There is tenderness in the left upper quadrant and left lower quadrant. There is no rigidity, no rebound, no guarding, no CVA tenderness, no tenderness at McBurney's point and negative Murphy's sign.  Neurological: She is alert and oriented to person, place, and time.  + dementia  Chronic benign essential tremor   Psychiatric: She has a normal mood and affect. Her behavior is normal. Her mood appears not anxious. She is not agitated.  Nursing note and vitals reviewed.     Assessment & Plan:   1. Other chest pain   2. Abdominal pain, acute, left lower quadrant     Abdominal pain, acute, left lower quadrant UA- tace blood, small LEU Neg nitrated Specimen sent for U/S, ABX not indicated  Other chest  pain EKG- SB No ST changes noted No previous EKG tracing in system to compare with Due to length and nature of CP- advised to be seen immediately at nearest ED. Daughter at [facility] and will drive her to ED.    FOLLOW-UP:  Return if symptoms worsen or fail to improve.

## 2017-09-01 NOTE — Assessment & Plan Note (Addendum)
UA- tace blood, small LEU Neg nitrated Specimen sent for U/S, ABX not indicated

## 2017-09-01 NOTE — ED Notes (Signed)
Patient transported to CT 

## 2017-09-01 NOTE — Patient Instructions (Addendum)
Angina Pectoris Angina pectoris is a very bad feeling in the chest, neck, or arm. Your doctor may call it angina. There are four types of angina. Angina is caused by a lack of blood in the middle and thickest layer of the heart wall (myocardium). Angina may feel like a crushing or squeezing pain in the chest. It may feel like tightness or heavy pressure in the chest. Some people say it feels like gas, heartburn, or indigestion. Some people have symptoms other than pain. These include:  Shortness of breath.  Cold sweats.  Feeling sick to your stomach (nausea).  Feeling light-headed.  Many women have chest discomfort and some of the other symptoms. However, women often have different symptoms, such as:  Feeling tired (fatigue).  Feeling nervous for no reason.  Feeling weak for no reason.  Dizziness or fainting.  Women may have angina without any symptoms. Follow these instructions at home:  Take medicines only as told by your doctor.  Take care of other health issues as told by your doctor. These include: ? High blood pressure (hypertension). ? Diabetes.  Follow a heart-healthy diet. Your doctor can help you to choose healthy food options and make changes.  Talk to your doctor to learn more about healthy cooking methods and use them. These include: ? Roasting. ? Grilling. ? Broiling. ? Baking. ? Poaching. ? Steaming. ? Stir-frying.  Follow an exercise program approved by your doctor.  Keep a healthy weight. Lose weight as told by your doctor.  Rest when you are tired.  Learn to manage stress.  Do not use any tobacco, such as cigarettes, chewing tobacco, or electronic cigarettes. If you need help quitting, ask your doctor.  If you drink alcohol, and your doctor says it is okay, limit yourself to no more than 1 drink per day. One drink equals 12 ounces of beer, 5 ounces of wine, or 1 ounces of hard liquor.  Stop illegal drug use.  Keep all follow-up visits as told  by your doctor. This is important. Do not take these medicines unless your doctor says that you can:  Nonsteroidal anti-inflammatory drugs (NSAIDs). These include: ? Ibuprofen. ? Naproxen. ? Celecoxib.  Vitamin supplements that have vitamin A, vitamin E, or both.  Hormone therapy that contains estrogen with or without progestin.  Get help right away if:  You have pain in your chest, neck, arm, jaw, stomach, or back that: ? Lasts more than a few minutes. ? Comes back. ? Does not get better after you take medicine under your tongue (sublingual nitroglycerin).  You have any of these symptoms for no reason: ? Gas, heartburn, or indigestion. ? Sweating a lot. ? Shortness of breath or trouble breathing. ? Feeling sick to your stomach or throwing up. ? Feeling more tired than usual. ? Feeling nervous or worrying more than usual. ? Feeling weak. ? Diarrhea.  You are suddenly dizzy or light-headed.  You faint or pass out. These symptoms may be an emergency. Do not wait to see if the symptoms will go away. Get medical help right away. Call your local emergency services (911 in the [place] Do not drive yourself to the hospital. This information is not intended to replace advice given to you by your health care provider. Make sure you discuss any questions you have with your health care provider. Document Released: 07/24/2007 Document Revised: 07/13/2015 Document Reviewed: 06/08/2013 Elsevier Interactive Patient Education  2017 [place].  Urinalysis- only small amount of white blood cells and  blood.  Will send urine for culture/sensitivity.  We will call you with the results. EKG- Sinus Bradycardia. Due to length and nature of symptoms- advised to be seen at nearest Emergency Department for cardiac evaluation. Follow-up as needed.

## 2017-09-01 NOTE — ED Provider Notes (Signed)
[facility] EMERGENCY DEPARTMENT Provider Note   CSN: [id number] Arrival date & time: 09/01/17  1153     History   Chief Complaint Chief Complaint  Patient presents with  . Chest Pain    HPI [name] is a 84 y.o. female.  84 year old female with past medical history including mild dementia, esophageal stricture, hypertension, hiatal hernia who presents with chest pain.  Patient reports that she has had several days of intermittent left-sided chest pain that sometimes goes to her left upper back.  She cannot characterize the pain.  She states that it is worse at night.  She initially told me that it only occurs at night but then family later said that she has complained about it today.  She has had some increased fatigue and shortness of breath.  No fevers, cough/cold symptoms, nausea, vomiting, or diaphoresis.  No falls or recent injury.  No dysuria, hematuria, abdominal pain.  She was seen by her PCP and sent here for further evaluation.  LEVEL 5 CAVEAT DUE TO DEMENTIA  The history is provided by the patient.  Chest Pain      Past Medical History:  Diagnosis Date  . Abnormal involuntary movements(781.0)   . Anemia   . Anxiety state, unspecified   . Blood transfusion   . Cardiac dysrhythmia, unspecified   . Chronic constipation   . DJD (degenerative joint disease)   . Esophageal stricture   . Generalized osteoarthrosis, unspecified site   . GERD (gastroesophageal reflux disease)   . Hiatal hernia   . Hypertension   . Osteoporosis   . Pelvic adhesions   . Postgastrectomy syndrome   . Pure hypercholesterolemia   . Tortuous colon   . Urinary tract infection, site not specified   . Venous insufficiency   . Vitamin B12 deficiency     Patient Active Problem List   Diagnosis Date Noted  . Abdominal pain, acute, left lower quadrant 09/01/2017  . Other chest pain 09/01/2017  . CKD (chronic kidney disease), symptom management only, stage 3 (moderate)  ([place]) 07/03/2017  . Cognitive changes 03/05/2017  . Healthcare maintenance 03/05/2017  . Hypertension 03/05/2017  . Cervicalgia 10/10/2016  . Lower extremity edema 10/03/2016  . Dyspnea on exertion 10/03/2016  . Right arm pain 09/04/2016  . Migraine 09/04/2016  . Numerous moles 09/04/2016  . Frequent headaches 09/04/2016  . Chronic insomnia 05/11/2015  . Vitamin D deficiency 05/17/2014  . Counseling regarding end of life decision making 05/03/2014  . Mild dementia 05/03/2014  . Lumbago 05/11/2012  . Vitamin B 12 deficiency 05/11/2012  . Dysphagia, pharyngoesophageal phase 12/17/2010  . Esophageal reflux 12/17/2010  . Abdominal hernia 12/17/2010  . Anemia, iron deficiency 12/06/2010  . HYPERCHOLESTEROLEMIA, MILD 11/19/2008  . Venous (peripheral) insufficiency 11/14/2007  . Essential hypertension, benign 05/14/2007  . Arrhythmia 05/14/2007  . CONSTIPATION, CHRONIC 05/14/2007  . Osteoporosis 05/14/2007  . Benign essential tremor 05/14/2007  . HIATAL HERNIA 12/18/2006  . DEGENERATIVE JOINT DISEASE, GENERALIZED 12/18/2006    Past Surgical History:  Procedure Laterality Date  . CHOLECYSTECTOMY    . COLONOSCOPY    . ESOPHAGUS SURGERY  2003  . mole biopsy- left neck area Left   . NISSEN FUNDOPLICATION    . TOTAL ABDOMINAL HYSTERECTOMY     DUB,   . UPPER GASTROINTESTINAL ENDOSCOPY       OB History   None      Home Medications    Prior to Admission medications   Medication Sig Start  Date End Date Taking? Authorizing Provider  acetaminophen (TYLENOL) 500 MG tablet Take 1,000 mg by mouth as needed.     [provider]  AMBULATORY NON FORMULARY MEDICATION Medication Name: GI Cocktail with 20cc Maalox, 20cc Viscous Lidocaine 2%, take 30cc dose q8hr as needed for GI upset. 11/20/16   Irene Shipper, MD  Calcium-Vitamin D (CALTRATE 600 PLUS-VIT D PO) Take 1 tablet by mouth daily.    [provider]  diltiazem (CARDIZEM CD) 180 MG 24 hr capsule TAKE 1 CAPSULE  BY MOUTH EVERY DAY. 07/03/17   Danford, Valetta Fuller D, NP  losartan-hydrochlorothiazide (HYZAAR) 50-12.5 MG tablet Take 1 tablet by mouth daily. 07/03/17   Danford, Valetta Fuller D, NP  MELATONIN PO Take by mouth at bedtime.    [provider]  metoprolol succinate (TOPROL-XL) 25 MG 24 hr tablet Take 1 tablet (25 mg total) by mouth daily. Take with or immediately following a meal. 07/03/17   Danford, Valetta Fuller D, NP  Multiple Vitamins-Minerals (PRESERVISION AREDS 2 PO) Take 2 capsules by mouth daily.      [provider]  omeprazole (PRILOSEC) 20 MG capsule TAKE 1 CAPSULE BY MOUTH DAILY. 08/24/15   Bedsole, Amy E, MD  polyethylene glycol (MIRALAX / GLYCOLAX) packet Take 17 g by mouth daily.    [provider]  pravastatin (PRAVACHOL) 40 MG tablet Take 1 tablet (40 mg total) by mouth every evening. 07/03/17   Danford, Valetta Fuller D, NP  diltiazem (DILACOR XR) 180 MG 24 hr capsule Take 1 capsule (180 mg total) by mouth daily. 12/04/10 12/30/11  Noralee Space, MD    Family History Family History  Problem Relation Age of Onset  . Cancer Mother        unknown type, most likely colon  . Heart disease Brother   . Arthritis Father   . Heart disease Sister     Social History Social History   Tobacco Use  . Smoking status: Never Smoker  . Smokeless tobacco: Never Used  Substance Use Topics  . Alcohol use: No    Alcohol/week: 0.0 oz  . Drug use: No     Allergies   Patient has no known allergies.   Review of Systems Review of Systems  Unable to perform ROS: Dementia  Cardiovascular: Positive for chest pain.     Physical Exam Updated Vital Signs BP (!) 141/65   Pulse 62   Temp 97.6 F (36.4 C) (Oral)   Resp 18   SpO2 94%   Physical Exam  Constitutional: She is oriented to person, place, and time. She appears well-developed and well-nourished. No distress.  HENT:  Head: Normocephalic and atraumatic.  Moist mucous membranes  Eyes: Pupils are equal, round, and reactive to light.  Conjunctivae are normal.  Neck: Neck supple.  Cardiovascular: Normal rate, regular rhythm and normal heart sounds.  No murmur heard. Pulmonary/Chest: Effort normal and breath sounds normal.  Abdominal: Soft. Bowel sounds are normal. She exhibits no distension. There is no tenderness.  Musculoskeletal: She exhibits no edema.  Mild L anterior and lateral chest wall tenderness  Neurological: She is alert and oriented to person, place, and time.  Fluent speech  Skin: Skin is warm and dry.  Psychiatric: She has a normal mood and affect. Judgment normal.  Nursing note and vitals reviewed.    ED Treatments / Results  Labs (all labs ordered are listed, but only abnormal results are displayed) Labs Reviewed  BASIC METABOLIC PANEL - Abnormal; Notable for the following components:  Result Value   Glucose, Bld 100 (*)    Calcium 8.8 (*)    GFR calc non Af Amer 58 (*)    All other components within normal limits  CBC - Abnormal; Notable for the following components:   RBC 3.62 (*)    Hemoglobin 11.7 (*)    HCT 34.6 (*)    All other components within normal limits  URINALYSIS, ROUTINE W REFLEX MICROSCOPIC - Abnormal; Notable for the following components:   Color, Urine STRAW (*)    APPearance CLOUDY (*)    Leukocytes, UA SMALL (*)    All other components within normal limits  URINALYSIS, MICROSCOPIC (REFLEX) - Abnormal; Notable for the following components:   Bacteria, UA MANY (*)    All other components within normal limits  URINE CULTURE  TROPONIN I  TROPONIN I    EKG EKG Interpretation  Date/Time:  Monday September 01 2017 12:01:53 EDT Ventricular Rate:  61 PR Interval:    QRS Duration: 112 QT Interval:  488 QTC Calculation: 492 R Axis:     Text Interpretation:  Sinus rhythm Borderline intraventricular conduction delay Low voltage, precordial leads Borderline T abnormalities, anterior leads Borderline prolonged QT interval similar to previous Confirmed by Theotis Burrow  [phone]) on 09/01/2017 12:07:47 PM Also confirmed by Theotis Burrow [phone]), editor [place], Jeannetta Nap [phone])  on 09/01/2017 12:26:07 PM   Radiology Dg Chest 2 View  Result Date: 09/01/2017 CLINICAL DATA:  Chest pain, left arm pain EXAM: CHEST - 2 VIEW COMPARISON:  06/06/2005 FINDINGS: Moderate-sized hiatal hernia. Heart is normal size. Linear atelectasis or scarring in the lung bases. No effusions or acute bony abnormality. IMPRESSION: Bibasilar scarring or atelectasis. Moderate-sized hiatal hernia. Electronically Signed   By: Rolm Baptise M.D.   On: 09/01/2017 12:26   Ct Angio Chest Pe W/cm &/or Wo Cm  Result Date: 09/01/2017 CLINICAL DATA:  84 year old female with left-sided chest pain and upper back pain for the past 3 days. Subsequent encounter. EXAM: CT ANGIOGRAPHY CHEST WITH CONTRAST TECHNIQUE: Multidetector CT imaging of the chest was performed using the standard protocol during bolus administration of intravenous contrast. Multiplanar CT image reconstructions and MIPs were obtained to evaluate the vascular anatomy. CONTRAST:  121mL ISOVUE-370 IOPAMIDOL (ISOVUE-370) INJECTION 76% COMPARISON:  09/01/2017 chest x-ray. FINDINGS: Cardiovascular: Exam is motion degraded. No pulmonary embolus or aortic dissection. Atherosclerotic changes of the tortuous thoracic aorta. Ascending thoracic aorta measures up to 3.2 cm. Heart size top-normal.  Coronary artery calcifications. Mediastinum/Nodes: Moderate size hiatal hernia. Nonspecific 3.2 x 2.3 x 1.2 cm hyperenhancement right lobe with thyroid gland. No mediastinal hilar are or axillary adenopathy. Lungs/Pleura: Scattered subcentimeter predominantly ground-glass opacities along the peripheral aspect of the lungs bilaterally. Trachea and mainstem bronchi are patent. Upper Abdomen: Fatty liver.  No free air. Musculoskeletal: Mild degenerative changes lower cervical and throughout thoracic spine without compression fracture or destructive lesion. Right  sternoclavicular joint degenerative changes. Review of the MIP images confirms the above findings. IMPRESSION: 1. Exam is motion degraded.  No pulmonary embolus noted. 2. Aortic Atherosclerosis (ICD10-I70.0). Tortuous aorta. No evidence of dissection or thoracic aortic aneurysm. 3. Coronary artery calcifications 4. Moderate-sized hiatal hernia. 5. Subcentimeter predominantly ground-glass nodular opacities along the peripheral aspects of the lungs bilaterally. Non-contrast chest CT at 3-6 months is recommended. If the nodules are stable at time of repeat CT, then future CT at 18-24 months (from today's scan) is considered optional for low-risk patients, but is recommended for high-risk patients. This recommendation follows the consensus statement:  Guidelines for Management of Incidental Pulmonary Nodules Detected on CT Images: From the Fleischner Society 2017; Radiology 2017; 284:228-243. 6. Nonspecific 3.2 x 2.3 x 1.2 cm hyperenhancement right lobe of thyroid gland. Thyroid ultrasound can be obtained for further delineation if clinically desired. Electronically Signed   By: Genia Del M.D.   On: 09/01/2017 13:25    Procedures Procedures (including critical care time)  Medications Ordered in ED Medications  iopamidol (ISOVUE-370) 76 % injection 100 mL (100 mLs Intravenous Contrast Given 09/01/17 1252)     Initial Impression / Assessment and Plan / ED Course  I have reviewed the triage vital signs and the nursing notes.  Pertinent labs & imaging results that were available during my care of the patient were reviewed by me and considered in my medical decision making (see chart for details).     Well appearing on exam, reassuring VS. EKG without ischemic changes.  Chest x-ray negative acute and negative serial troponins.  I did obtain CT scan given her chest wall pain and intermittent complaints, to evaluate for rib fractures or PE.  CTA was negative for acute process to explain her symptoms.   Incidental finding of pulmonary nodules and thyroid irregularity.  I discussed these findings with family and need for outpatient follow-up.  Her urine had a small amount of leukocytes and bacteria.  Patient is denying any urinary complaints.  I discussed options of antibiotics versus watchful waiting with family and they feel comfortable waiting with urine cultures pending.  They understand return precautions.  Regarding her chest pain, description that it occurs mainly at night and the fact that palpation of her chest wall reproduces the pain suggest that her symptoms are noncardiac in nature.  I recommended that she follow closely with PCP and I have extensively reviewed return precautions with family.  They voiced understanding of plan and patient discharged in satisfactory condition. Final Clinical Impressions(s) / ED Diagnoses   Final diagnoses:  Atypical chest pain  Pulmonary nodules    ED Discharge Orders    None       Little, Wenda Overland, MD 09/01/17 1644

## 2017-09-01 NOTE — Discharge Instructions (Addendum)
Follow up with primary care provider. They can order chest CT in 3-6 months and thyroid US. Return to ER if worsening pain, breathing problems, fever, or confusion.

## 2017-09-03 LAB — URINE CULTURE: Special Requests: NORMAL

## 2017-09-04 ENCOUNTER — Telehealth: Payer: Self-pay | Admitting: *Deleted

## 2017-09-04 ENCOUNTER — Other Ambulatory Visit: Payer: Self-pay | Admitting: Adult Health

## 2017-09-04 LAB — CULTURE, URINE COMPREHENSIVE

## 2017-09-04 MED ORDER — NITROFURANTOIN MONOHYD MACRO 100 MG PO CAPS: 100.0000 mg | ORAL_CAPSULE | Freq: Two times a day (BID) | ORAL | 0 refills | Status: DC

## 2017-09-04 NOTE — Telephone Encounter (Signed)
Post ED Visit - Positive Culture Follow-up: Successful Patient Follow-Up  Culture assessed and recommendations reviewed by:  []  Elenor Quinones, Pharm.D. []  Heide Guile, Pharm.D., BCPS AQ-ID []  Parks Neptune, Pharm.D., BCPS []  Alycia Rossetti, Pharm.D., BCPS []  [place], Pharm.D., BCPS, AAHIVP []  Legrand Como, Pharm.D., BCPS, AAHIVP [x]  Salome Arnt, PharmD, BCPS []  Johnnette Gourd, PharmD, BCPS []  Hughes Better, PharmD, BCPS []  Leeroy Cha, PharmD  Positive urine culture  [x]  Patient discharged without antimicrobial prescription and treatment is now indicated []  Organism is resistant to prescribed ED discharge antimicrobial []  Patient with positive blood cultures  Changes discussed with ED provider: Suella Broad, PA-C New antibiotic prescription Cephalexin 500mg  PO BID x 7 days Called to [place]  Contacted patient, date 09/04/17, time [place], [place] 09/04/2017, 10:02 AM

## 2017-09-04 NOTE — Progress Notes (Signed)
ED Antimicrobial Stewardship Positive Culture Follow Up   [name] is an 84 y.o. female who presented to [facility] on 09/01/2017 with a chief complaint of  Chief Complaint  Patient presents with  . Chest Pain    Recent Results (from the past 720 hour(s))  CULTURE, URINE COMPREHENSIVE     Status: Abnormal (Preliminary result)   Collection Time: 09/01/17  1:04 PM  Result Value Ref Range Status   Urine Culture, Comprehensive Preliminary report (A)  Preliminary   Organism ID, Bacteria Escherichia coli (A)  Preliminary    Comment: Greater than 100,000 colony forming units per mL  Urine culture     Status: Abnormal   Collection Time: 09/01/17  1:28 PM  Result Value Ref Range Status   Specimen Description   Final    URINE, CLEAN CATCH Performed at [facility], [address] [place], [place] [address]    Special Requests   Final    Normal Performed at [facility], [address] [place], [place] [address]    Culture >=100,000 COLONIES/mL ESCHERICHIA COLI (A)  Final   Report Status 09/03/2017 FINAL  Final   Organism ID, Bacteria ESCHERICHIA COLI (A)  Final      Susceptibility   Escherichia coli - MIC*    AMPICILLIN <=2 SENSITIVE Sensitive     CEFAZOLIN <=4 SENSITIVE Sensitive     CEFTRIAXONE <=1 SENSITIVE Sensitive     CIPROFLOXACIN <=0.25 SENSITIVE Sensitive     GENTAMICIN <=1 SENSITIVE Sensitive     IMIPENEM <=0.25 SENSITIVE Sensitive     NITROFURANTOIN <=16 SENSITIVE Sensitive     TRIMETH/SULFA <=20 SENSITIVE Sensitive     AMPICILLIN/SULBACTAM <=2 SENSITIVE Sensitive     PIP/TAZO <=4 SENSITIVE Sensitive     Extended ESBL NEGATIVE Sensitive     * >=100,000 COLONIES/mL ESCHERICHIA COLI    []  Treated with N/A, organism resistant to prescribed antimicrobial [x]  Patient discharged originally without antimicrobial agent and treatment is now indicated  New antibiotic prescription: cephalexin 500mg  PO BID x 7 days  ED Provider: Suella Broad,  PA   Monty Spicher, Rande Lawman 09/04/2017, 9:33 AM Clinical Pharmacist Monday - Friday phone -  [phone] Saturday - Sunday phone - [phone]

## 2017-09-05 ENCOUNTER — Telehealth: Payer: Self-pay | Admitting: Adult Health

## 2017-09-05 NOTE — Telephone Encounter (Signed)
Patient's daughter Malachy Mood called states pt was given Rx by Ed provider  & same Rx cld into pharmacy by PCP/ Valetta Fuller too  :  nitrofurantoin, macrocrystal-monohydrate, (MACROBID) 100 MG capsule [[id number]]   Order Details  Dose: 100 mg Route: Oral Frequency: 2 times daily  Indications of Use: Urinary Tract Infection  Dispense Quantity: 14 capsule Refills: 0 Fills remaining: --        Sig: Take 1 capsule (100 mg total) by mouth 2 (two) times daily.     Pt's daughters are unsure since picked up ED doc's Rx yesterday & patient is taking, should they get the Rx ordered by Valetta Fuller? --forwarding message to medical assistant to call pt's daughters Gertie Baron @ [phone] & or Malachy Mood @ [phone])  back to advise.  ---Forwarding message to medical assistant.   --Dion Body

## 2017-09-05 NOTE — Telephone Encounter (Signed)
LVM informing pt's daughter, Malachy Mood, that patient is to only take EDP's RX since she has already picked up the RX and started on the medication.  Charyl Bigger, CMA

## 2017-09-26 ENCOUNTER — Telehealth: Payer: Self-pay | Admitting: Adult Health

## 2017-09-26 ENCOUNTER — Other Ambulatory Visit: Payer: Self-pay | Admitting: Adult Health

## 2017-09-26 NOTE — Telephone Encounter (Signed)
Patient's daughter called and left VM asking for call back from clinic staff. She can be reached at [phone]

## 2017-09-28 NOTE — Telephone Encounter (Signed)
Good Morning Tonya, I have never given her Ultram before and I am unsure why she is requesting this rx. Can you please call her and inquire what symptoms she is having. She may need OV for assessment/treatment if new acute complaint. Thanks! Valetta Fuller

## 2017-09-29 ENCOUNTER — Other Ambulatory Visit: Payer: Self-pay | Admitting: Adult Health

## 2017-09-29 MED ORDER — TRAMADOL HCL 50 MG PO TABS: 50.0000 mg | ORAL_TABLET | Freq: Three times a day (TID) | ORAL | 0 refills | Status: DC | PRN

## 2017-09-29 NOTE — Telephone Encounter (Signed)
Spoke with pt's daughter, who states that the pain is located in the pt's neck with radiation into the left rib cage area.  This is similar to previous episodes and has been an intermittent problem for several years.  She has seen Dr. Ernestina Patches in the past and he has given her injections in the neck but she has not seen him in the last 18 months or so.  Denies any injury.  Please advise.  Charyl Bigger, CMA

## 2017-09-29 NOTE — Telephone Encounter (Signed)
Pt's daughter informed.  Pt's daughter expressed understanding and is agreeable.  T. Dominyck Reser, CMA  

## 2017-09-29 NOTE — Telephone Encounter (Signed)
Good Morning Tonya, You are correct- I provided her a Rx of Tramadol 50mg  Q8H PRN for R arm pain 08/2016 She has had multiple Tramadol Rx's throughout the years Can you please call and inquire- 1) Where the pain is located 2) When did it start 3) Description and pain rating Thanks! Valetta Fuller

## 2017-09-29 NOTE — Telephone Encounter (Signed)
Good Afternoon Tonya, I will send in short rx of Ultram, no refills Please advise Ms. Lore that she should follow-up with Dr. Ernestina Patches for assessment/treament. Thanks! Valetta Fuller

## 2017-09-29 NOTE — Telephone Encounter (Signed)
In reviewing chart, you did RX this medication in 7/18.  Daughter would like to know if you're willing to just RF medication or if she needs f/u OV.  Charyl Bigger, CMA

## 2017-11-10 ENCOUNTER — Telehealth: Payer: Self-pay | Admitting: Adult Health

## 2017-11-10 ENCOUNTER — Ambulatory Visit (INDEPENDENT_AMBULATORY_CARE_PROVIDER_SITE_OTHER): Payer: Medicare Other | Admitting: Adult Health

## 2017-11-10 DIAGNOSIS — R4182 Altered mental status, unspecified: Secondary | ICD-10-CM

## 2017-11-10 DIAGNOSIS — R8290 Unspecified abnormal findings in urine: Secondary | ICD-10-CM

## 2017-11-10 LAB — POCT URINALYSIS DIPSTICK
BILIRUBIN UA: NEGATIVE
Blood, UA: NEGATIVE
GLUCOSE UA: NEGATIVE
KETONES UA: NEGATIVE
Nitrite, UA: NEGATIVE
Protein, UA: NEGATIVE
SPEC GRAV UA: 1.015 (ref 1.010–1.025)
Urobilinogen, UA: 0.2 E.U./dL
pH, UA: 7 (ref 5.0–8.0)

## 2017-11-10 NOTE — Telephone Encounter (Signed)
Patient daughter / Ms. Rush called states patient has been acting kind of funny & thinks she may have an UTI, she wants to bring a urine specimen up for testing--Pls call Ms. Rush at [phone] with decision  --forwarding message to medical assistant.  --Dion Body

## 2017-11-10 NOTE — Progress Notes (Signed)
Telephone Open    11/10/2017 [facility], Berna Spare, NP  Family Medicine   Urinary Tract Infection    Reason for call   Conversation: Urinary Tract Infection  (Newest Message First)  Me       11/10/17 11:43 AM  Note    Pt's daughter informed.  Sterile cup placed at front desk for pick up.  Charyl Bigger, CMA    Mina Marble D, NP  to Me      11/10/17 9:36 AM  Note    If we do not have open OV, then let's at least do UA Thanks! [name]     Me  to Esaw Grandchild, NP      11/10/17 9:26 AM  Note    Is it okay to just do a UA?  Charyl Bigger, CMA          11/10/17 8:58 AM  Elberta Spaniel routed this conversation to Me  Elberta Spaniel       11/10/17 8:56 AM  Note    Patient daughter / Ms. Rush called states patient has been acting kind of funny & thinks she may have an UTI, she wants to bring a urine specimen up for testing--Pls call Ms. Rush at [phone] with decision  --forwarding message to medical assistant.  --Dion Body

## 2017-11-10 NOTE — Telephone Encounter (Signed)
Pt's daughter informed.  Sterile cup placed at front desk for pick up.  Charyl Bigger, CMA

## 2017-11-10 NOTE — Telephone Encounter (Signed)
If we do not have open OV, then let's at least do UA Thanks! Valetta Fuller

## 2017-11-10 NOTE — Telephone Encounter (Signed)
Is it okay to just do a UA?  Charyl Bigger, CMA

## 2017-11-13 ENCOUNTER — Telehealth: Payer: Self-pay | Admitting: Adult Health

## 2017-11-13 ENCOUNTER — Other Ambulatory Visit: Payer: Self-pay | Admitting: Adult Health

## 2017-11-13 LAB — CULTURE, URINE COMPREHENSIVE

## 2017-11-13 MED ORDER — NITROFURANTOIN MONOHYD MACRO 100 MG PO CAPS: 100.0000 mg | ORAL_CAPSULE | Freq: Two times a day (BID) | ORAL | 0 refills | Status: DC

## 2017-11-13 NOTE — Telephone Encounter (Signed)
Results discussed with pt's daughter.  Charyl Bigger, CMA

## 2017-11-13 NOTE — Telephone Encounter (Signed)
Patient's daughter/ Ms. Rush called for urine analysis  Results----Forwarding message to medical assistant to contact her at [phone].  ---glh

## 2018-03-03 NOTE — Progress Notes (Signed)
Subjective:    Patient ID: [name], female    DOB: Jun 02, 1923, 85 y.o.   MRN: [id number]  HPI: 10/10/16 OV:  [name] is here for f/u: bil lower extremity edema.   CXR was negative for CHF.  She has lost 4 lbs since last OV and denies CP/dyspnea at rest/palpitations/swelling of ankles/feet.  She completed the 7 day course of daily HCTZ 12.5mg . She has one acute complaint-cervical neck stiffness and difficulty rotating head to R.  She periodically receives trigger point injections with Ortho, last treatment was April 2018. Daughter at [facility] during [place].  03/05/17 OV: [name] is here for f/u:HTN, HL, Chronic Constipation, Benign essential tremor.  Family has concern's about her safety at home and deteriorating memory/cognition. Daughter's as BS and shared the following: -Pt cannot recall what she eats during the day -Pt cannot recall recent events - Pt feel twice in fall 2018- denies striking head or LOC -[employer] issued Drivers's License renewal, despite strong family objections.  She continues to drive to grocery store every Monday am and church on sundays.  She did get lost once 2 years ago, found on side of he road by our [place] -Daughter's want her to move to assisted living home  MMSE 17/30, after long discussion family declines referral to Neurology.  Will perform MMSE at future chronic f/u OVs to watch trend.  Pt denies CP/dyspnea/palpiations/HA/dizziness.  Pt reports medication compliance and denies SE.    She denies lower extremity edema.  She reports only using the microwave, never the stove/ove.  She only lives on bottom floor of home, she does have a "lift chair" that will safely transport her to second floor if needed.   She reports decrease in hearing, she has appt with audiologist tomorrow to have hearing aids cleaned.  07/03/17 OV: [name] is here for CPE MMSE today 22 Last MMSE 17/19 Mar 2017 Only acute complaint- R knee edema that started >1 week ago She denies  pain or instability.  She has Orthopedic Specialist that provided steroid injection last year. She denies numbness/tingling in R foot She reports being able perform ADLs, drive to grocery/church, and reports "being safe and happy in my home". Reviewed all recent labs. Daughter at [facility] during [place] Maintenance: PAP-not indicated Mammogram-not indicated DEXA- declined Immunizations-   03/05/2018 OV: [name] presents for f/u:HTN, HLD, Tremor, Chronic cervical neck pain She reports medication compliance, denies SE She drinks 10-20 oz water/day, 1-2 Dr. Pepper's/day She consistently follows heart healthy diet and remains active with the house/yard work. She has a strong support system of local family, her son checks in on her daily. She denies recent falls, reports being able to perform ADLs and IADLs Continue's to struggle with chronic cervical neck pain- using  OTC Acetaminophen and OTC Salon Paws Daughter at [facility] during [place]  Patient Care Team    Relationship Specialty Notifications Start End  Mina Marble D, NP PCP - General Family Medicine  03/13/16   Magnus Sinning, MD Consulting Physician Physical Medicine and Rehabilitation  03/13/16   Bo Merino, MD Consulting Physician Rheumatology  03/13/16   Irene Shipper, MD Consulting Physician Gastroenterology  03/13/16   Webb Laws, [place] Referring Physician Optometry  03/13/16     Patient Active Problem List   Diagnosis Date Noted  . Abdominal pain, acute, left lower quadrant 09/01/2017  . Other chest pain 09/01/2017  . CKD (chronic kidney disease), symptom management only, stage 3 (moderate) ([place]) 07/03/2017  .  Cognitive changes 03/05/2017  . Healthcare maintenance 03/05/2017  . Hypertension 03/05/2017  . Cervicalgia 10/10/2016  . Lower extremity edema 10/03/2016  . Dyspnea on exertion 10/03/2016  . Right arm pain 09/04/2016  . Migraine 09/04/2016  . Numerous moles 09/04/2016  . Frequent headaches 09/04/2016  .  Chronic insomnia 05/11/2015  . Vitamin D deficiency 05/17/2014  . Counseling regarding end of life decision making 05/03/2014  . Mild dementia ([place]) 05/03/2014  . Lumbago 05/11/2012  . Vitamin B 12 deficiency 05/11/2012  . Dysphagia, pharyngoesophageal phase 12/17/2010  . Esophageal reflux 12/17/2010  . Abdominal hernia 12/17/2010  . Anemia, iron deficiency 12/06/2010  . HYPERCHOLESTEROLEMIA, MILD 11/19/2008  . Venous (peripheral) insufficiency 11/14/2007  . Essential hypertension, benign 05/14/2007  . Arrhythmia 05/14/2007  . CONSTIPATION, CHRONIC 05/14/2007  . Osteoporosis 05/14/2007  . Benign essential tremor 05/14/2007  . HIATAL HERNIA 12/18/2006  . DEGENERATIVE JOINT DISEASE, GENERALIZED 12/18/2006     Past Medical History:  Diagnosis Date  . Abnormal involuntary movements(781.0)   . Anemia   . Anxiety state, unspecified   . Blood transfusion   . Cardiac dysrhythmia, unspecified   . Chronic constipation   . DJD (degenerative joint disease)   . Esophageal stricture   . Generalized osteoarthrosis, unspecified site   . GERD (gastroesophageal reflux disease)   . Hiatal hernia   . Hypertension   . Osteoporosis   . Pelvic adhesions   . Postgastrectomy syndrome   . Pure hypercholesterolemia   . Tortuous colon   . Urinary tract infection, site not specified   . Venous insufficiency   . Vitamin B12 deficiency      Past Surgical History:  Procedure Laterality Date  . CHOLECYSTECTOMY    . COLONOSCOPY    . ESOPHAGUS SURGERY  2003  . mole biopsy- left neck area Left   . NISSEN FUNDOPLICATION    . TOTAL ABDOMINAL HYSTERECTOMY     DUB,   . UPPER GASTROINTESTINAL ENDOSCOPY       Family History  Problem Relation Age of Onset  . Cancer Mother        unknown type, most likely colon  . Heart disease Brother   . Arthritis Father   . Heart disease Sister      Social History   Substance and Sexual Activity  Drug Use No     Social History   Substance and  Sexual Activity  Alcohol Use No  . Alcohol/week: 0.0 standard drinks     Social History   Tobacco Use  Smoking Status Never Smoker  Smokeless Tobacco Never Used     Outpatient Encounter Medications as of 03/05/2018  Medication Sig  . acetaminophen (TYLENOL) 500 MG tablet Take 1,000 mg by mouth as needed.   . AMBULATORY NON FORMULARY MEDICATION Medication Name: GI Cocktail with 20cc Maalox, 20cc Viscous Lidocaine 2%, take 30cc dose q8hr as needed for GI upset.  Marland Kitchen diltiazem (CARDIZEM CD) 180 MG 24 hr capsule TAKE 1 CAPSULE BY MOUTH EVERY DAY.  Marland Kitchen MELATONIN PO Take by mouth at bedtime.  . metoprolol succinate (TOPROL-XL) 25 MG 24 hr tablet Take 1 tablet (25 mg total) by mouth daily. Take with or immediately following a meal.  . Multiple Vitamins-Minerals (PRESERVISION/LUTEIN PO) Take 2 tablets by mouth daily.  Marland Kitchen omeprazole (PRILOSEC) 20 MG capsule TAKE 1 CAPSULE BY MOUTH DAILY.  Marland Kitchen polyethylene glycol (MIRALAX / GLYCOLAX) packet Take 17 g by mouth daily.  . pravastatin (PRAVACHOL) 40 MG tablet Take 1 tablet (40 mg total)  by mouth every evening.  . traMADol (ULTRAM) 50 MG tablet Take 1 tablet (50 mg total) by mouth every 8 (eight) hours as needed.  . valsartan-hydrochlorothiazide (DIOVAN-HCT) 80-12.5 MG tablet Take 1 tablet by mouth daily.  . [DISCONTINUED] diltiazem (CARDIZEM CD) 180 MG 24 hr capsule TAKE 1 CAPSULE BY MOUTH EVERY DAY.  . [DISCONTINUED] metoprolol succinate (TOPROL-XL) 25 MG 24 hr tablet Take 1 tablet (25 mg total) by mouth daily. Take with or immediately following a meal.  . [DISCONTINUED] pravastatin (PRAVACHOL) 40 MG tablet Take 1 tablet (40 mg total) by mouth every evening.  . [DISCONTINUED] valsartan-hydrochlorothiazide (DIOVAN-HCT) 80-12.5 MG tablet Take 1 tablet by mouth daily.  . [DISCONTINUED] Calcium-Vitamin D (CALTRATE 600 PLUS-VIT D PO) Take 1 tablet by mouth daily.  . [DISCONTINUED] diltiazem (DILACOR XR) 180 MG 24 hr capsule Take 1 capsule (180 mg total) by  mouth daily.  . [DISCONTINUED] losartan-hydrochlorothiazide (HYZAAR) 50-12.5 MG tablet Take 1 tablet by mouth daily.  . [DISCONTINUED] Multiple Vitamins-Minerals (PRESERVISION AREDS 2 PO) Take 2 capsules by mouth daily.    . [DISCONTINUED] nitrofurantoin, macrocrystal-monohydrate, (MACROBID) 100 MG capsule Take 1 capsule (100 mg total) by mouth 2 (two) times daily.   No facility-administered encounter medications on file as of 03/05/2018.     Allergies: Patient has no known allergies.  Body mass index is 25.64 kg/m.  Blood pressure 129/65, pulse (!) 57, temperature 98.5 F (36.9 C), temperature source Oral, height 5\' 1"  (1.549 m), weight 135 lb 11.2 oz (61.6 kg), SpO2 96 %.    Review of Systems  Constitutional: Positive for fatigue. Negative for activity change, appetite change, chills, diaphoresis, fever and unexpected weight change.  HENT: Negative for congestion.   Respiratory: Negative for cough, chest tightness, shortness of breath, wheezing and stridor.   Cardiovascular: Negative for chest pain, palpitations and leg swelling.  Gastrointestinal: Negative for abdominal distention, abdominal pain, blood in stool, constipation, diarrhea, nausea and vomiting.  Endocrine: Negative for cold intolerance, heat intolerance, polydipsia, polyphagia and polyuria.  Genitourinary: Negative for difficulty urinating and flank pain.  Musculoskeletal: Positive for arthralgias, back pain, gait problem, joint swelling, myalgias, neck pain and neck stiffness.  Skin: Negative for color change, pallor, rash and wound.  Neurological: Negative for dizziness and headaches.  Hematological: Does not bruise/bleed easily.  Psychiatric/Behavioral: Negative for confusion, decreased concentration, dysphoric mood, hallucinations, self-injury, sleep disturbance and suicidal ideas. The patient is not nervous/anxious and is not hyperactive.        Objective:   Physical Exam  Constitutional: She appears  well-developed and well-nourished. No distress.  HENT:  Head: Normocephalic and atraumatic.  Right Ear: External ear normal. Decreased hearing is noted.  Left Ear: External ear normal. Decreased hearing is noted.  HOH despite bil hearing aids  Eyes: Pupils are equal, round, and reactive to light. Conjunctivae are normal.  Neck: Neck supple. Muscular tenderness present. Decreased range of motion present.  Cardiovascular: Normal rate, regular rhythm, normal heart sounds and intact distal pulses.  No murmur heard. Pulmonary/Chest: Effort normal and breath sounds normal. No respiratory distress. She has no wheezes. She has no rales. She exhibits no tenderness.  Abdominal: Soft. Bowel sounds are normal. She exhibits no distension and no mass. There is no abdominal tenderness. There is no rebound and no guarding.  Musculoskeletal:        General: Deformity and edema present. No tenderness.     Right knee: She exhibits swelling. No tenderness found.     Left knee: She exhibits no  swelling.     Comments: Sig arthritic phalanges   Lymphadenopathy:    She has no cervical adenopathy.  Neurological: She is alert.  Skin: Skin is warm and dry. No rash noted. She is not diaphoretic. No erythema. No pallor.  Psychiatric: She has a normal mood and affect. Her behavior is normal. Judgment and thought content normal.  Nursing note and vitals reviewed.     Assessment & Plan:   1. HYPERCHOLESTEROLEMIA, MILD   2. Essential hypertension, benign   3. Iron deficiency anemia, unspecified iron deficiency anemia type   4. Healthcare maintenance   5. Hypertension, unspecified type   6. CKD (chronic kidney disease), symptom management only, stage 3 (moderate) (HCC)   7. Elevated random blood glucose level   8. Cervicalgia   9. Benign essential tremor     Healthcare maintenance Continue all medications as directed. Remain well hydrated and follow a Heart Healthy Diet. Remain as active as  possible. Recommend follow-up in Oct with fasting labs.  Hypertension BP at goal 129/65, HR 57 Continue Diltiazem 180mg  QD, Metoprolol 25mg  QD, Diovan 80/12.5mg  QD Denies acute cardiac sx's  Cervicalgia OTC Acetaminophen and OTC Salon Paws  Benign essential tremor Stable Affects head primarily  FOLLOW-UP:  Return in about 9 months (around 12/04/2018) for Fasting Labs, Regular Follow Up.

## 2018-03-05 ENCOUNTER — Encounter: Payer: Self-pay | Admitting: Adult Health

## 2018-03-05 ENCOUNTER — Ambulatory Visit (INDEPENDENT_AMBULATORY_CARE_PROVIDER_SITE_OTHER): Payer: Medicare Other | Admitting: Adult Health

## 2018-03-05 VITALS — BP 129/65 | HR 57 | Temp 98.5°F | Ht 61.0 in | Wt 135.7 lb

## 2018-03-05 DIAGNOSIS — N183 Chronic kidney disease, stage 3 unspecified: Secondary | ICD-10-CM

## 2018-03-05 DIAGNOSIS — M542 Cervicalgia: Secondary | ICD-10-CM

## 2018-03-05 DIAGNOSIS — E7800 Pure hypercholesterolemia, unspecified: Secondary | ICD-10-CM | POA: Diagnosis not present

## 2018-03-05 DIAGNOSIS — I10 Essential (primary) hypertension: Secondary | ICD-10-CM | POA: Diagnosis not present

## 2018-03-05 DIAGNOSIS — R7309 Other abnormal glucose: Secondary | ICD-10-CM

## 2018-03-05 DIAGNOSIS — D509 Iron deficiency anemia, unspecified: Secondary | ICD-10-CM | POA: Diagnosis not present

## 2018-03-05 DIAGNOSIS — Z0000 Encounter for general adult medical examination without abnormal findings: Secondary | ICD-10-CM | POA: Diagnosis not present

## 2018-03-05 DIAGNOSIS — G250 Essential tremor: Secondary | ICD-10-CM

## 2018-03-05 DIAGNOSIS — R739 Hyperglycemia, unspecified: Secondary | ICD-10-CM

## 2018-03-05 MED ORDER — PRAVASTATIN SODIUM 40 MG PO TABS: 40.0000 mg | ORAL_TABLET | Freq: Every evening | ORAL | 2 refills | Status: DC

## 2018-03-05 MED ORDER — METOPROLOL SUCCINATE ER 25 MG PO TB24: 25.0000 mg | ORAL_TABLET | Freq: Every day | ORAL | 2 refills | Status: DC

## 2018-03-05 MED ORDER — VALSARTAN-HYDROCHLOROTHIAZIDE 80-12.5 MG PO TABS: 1.0000 | ORAL_TABLET | Freq: Every day | ORAL | 2 refills | Status: DC

## 2018-03-05 MED ORDER — DILTIAZEM HCL ER COATED BEADS 180 MG PO CP24: ORAL_CAPSULE | ORAL | 2 refills | Status: DC

## 2018-03-05 NOTE — Assessment & Plan Note (Signed)
OTC Acetaminophen and OTC Salon Paws

## 2018-03-05 NOTE — Patient Instructions (Signed)

## 2018-03-05 NOTE — Assessment & Plan Note (Addendum)
BP at goal 129/65, HR 57 Continue Diltiazem 180mg  QD, Metoprolol 25mg  QD, Diovan 80/12.5mg  QD Denies acute cardiac sx's

## 2018-03-05 NOTE — Assessment & Plan Note (Signed)
Continue all medications as directed. Remain well hydrated and follow a Heart Healthy Diet. Remain as active as possible. Recommend follow-up in Oct with fasting labs.

## 2018-03-05 NOTE — Assessment & Plan Note (Signed)
Stable Affects head primarily

## 2018-06-08 ENCOUNTER — Ambulatory Visit: Payer: Medicare Other | Admitting: Adult Health

## 2018-06-17 ENCOUNTER — Ambulatory Visit (INDEPENDENT_AMBULATORY_CARE_PROVIDER_SITE_OTHER): Payer: Medicare Other | Admitting: Adult Health

## 2018-06-17 ENCOUNTER — Other Ambulatory Visit: Payer: Self-pay

## 2018-06-17 ENCOUNTER — Encounter: Payer: Self-pay | Admitting: Adult Health

## 2018-06-17 VITALS — BP 122/63 | HR 67

## 2018-06-17 DIAGNOSIS — Z0000 Encounter for general adult medical examination without abnormal findings: Secondary | ICD-10-CM

## 2018-06-17 NOTE — Progress Notes (Signed)
Virtual Visit via Telephone Note  I connected with [name] on 06/17/18 at  1:15 PM EDT by telephone and verified that I am speaking with the correct person using two identifiers.   I discussed the limitations, risks, security and privacy concerns of performing an evaluation and management service by telephone and the availability of in person appointments. I also discussed with the patient that there may be a patient responsible charge related to this service. The patient expressed understanding and agreed to proceed.  Location of Patient- Home Location of Provider- In Clinic   Subjective:   [name] is a 85 y.o. female who presents for Medicare Annual (Subsequent) preventive examination.  Review of Systems: General:   No F/C, wt loss Pulm:   No DIB, SOB, pleuritic chest pain Card:  No CP, palpitations Abd:  No n/v/d or pain Ext:  No inc edema from baseline    Objective:     Vitals: BP 122/63   Pulse 67   There is no height or weight on file to calculate BMI.  Advanced Directives 09/01/2017 03/13/2016  Does Patient Have a Medical Advance Directive? No;Yes Yes  Type of Advance Directive Living will;Healthcare Power of Attorney Living will;Healthcare Power of Attorney  Does patient want to make changes to medical advance directive? No - Patient declined -  Would patient like information on creating a medical advance directive? No - Patient declined -    Tobacco Social History   Tobacco Use  Smoking Status Never Smoker  Smokeless Tobacco Never Used     Counseling given: Not Answered   Past Medical History:  Diagnosis Date  . Abnormal involuntary movements(781.0)   . Anemia   . Anxiety state, unspecified   . Blood transfusion   . Cardiac dysrhythmia, unspecified   . Chronic constipation   . DJD (degenerative joint disease)   . Esophageal stricture   . Generalized osteoarthrosis, unspecified site   . GERD (gastroesophageal reflux disease)   . Hiatal  hernia   . Hypertension   . Osteoporosis   . Pelvic adhesions   . Postgastrectomy syndrome   . Pure hypercholesterolemia   . Tortuous colon   . Urinary tract infection, site not specified   . Venous insufficiency   . Vitamin B12 deficiency    Past Surgical History:  Procedure Laterality Date  . CHOLECYSTECTOMY    . COLONOSCOPY    . ESOPHAGUS SURGERY  2003  . mole biopsy- left neck area Left   . NISSEN FUNDOPLICATION    . TOTAL ABDOMINAL HYSTERECTOMY     DUB,   . UPPER GASTROINTESTINAL ENDOSCOPY     Family History  Problem Relation Age of Onset  . Cancer Mother        unknown type, most likely colon  . Heart disease Brother   . Arthritis Father   . Heart disease Sister    Social History   Socioeconomic History  . Marital status: Widowed    Spouse name: Not on file  . Number of children: 3  . Years of education: Not on file  . Highest education level: Not on file  Occupational History  . Occupation: Retired  Scientific laboratory technician  . Financial resource strain: Not on file  . Food insecurity:    Worry: Not on file    Inability: Not on file  . Transportation needs:    Medical: Not on file    Non-medical: Not on file  Tobacco Use  . Smoking status: Never  Smoker  . Smokeless tobacco: Never Used  Substance and Sexual Activity  . Alcohol use: No    Alcohol/week: 0.0 standard drinks  . Drug use: No  . Sexual activity: Never    Birth control/protection: Surgical  Lifestyle  . Physical activity:    Days per week: Not on file    Minutes per session: Not on file  . Stress: Not on file  Relationships  . Social connections:    Talks on phone: Not on file    Gets together: Not on file    Attends religious service: Not on file    Active member of club or organization: Not on file    Attends meetings of clubs or organizations: Not on file    Relationship status: Not on file  Other Topics Concern  . Not on file  Social History Narrative   Widowed,  4 children, lives close  by.    Daughters [place], [place]) come to OV with her.       Outpatient Encounter Medications as of 06/17/2018  Medication Sig  . acetaminophen (TYLENOL) 500 MG tablet Take 1,000 mg by mouth as needed.   . AMBULATORY NON FORMULARY MEDICATION Medication Name: GI Cocktail with 20cc Maalox, 20cc Viscous Lidocaine 2%, take 30cc dose q8hr as needed for GI upset.  Marland Kitchen diltiazem (CARDIZEM CD) 180 MG 24 hr capsule TAKE 1 CAPSULE BY MOUTH EVERY DAY.  Marland Kitchen MELATONIN PO Take by mouth at bedtime.  . metoprolol succinate (TOPROL-XL) 25 MG 24 hr tablet Take 1 tablet (25 mg total) by mouth daily. Take with or immediately following a meal.  . omeprazole (PRILOSEC) 20 MG capsule TAKE 1 CAPSULE BY MOUTH DAILY.  Marland Kitchen polyethylene glycol (MIRALAX / GLYCOLAX) packet Take 17 g by mouth daily as needed.   . pravastatin (PRAVACHOL) 40 MG tablet Take 1 tablet (40 mg total) by mouth every evening.  . traMADol (ULTRAM) 50 MG tablet Take 1 tablet (50 mg total) by mouth every 8 (eight) hours as needed.  . valsartan-hydrochlorothiazide (DIOVAN-HCT) 80-12.5 MG tablet Take 1 tablet by mouth daily.  . [DISCONTINUED] Multiple Vitamins-Minerals (PRESERVISION/LUTEIN PO) Take 2 tablets by mouth daily.  . [DISCONTINUED] diltiazem (DILACOR XR) 180 MG 24 hr capsule Take 1 capsule (180 mg total) by mouth daily.   No facility-administered encounter medications on file as of 06/17/2018.     Activities of Daily Living In your present state of health, do you have any difficulty performing the following activities: 06/17/2018  Hearing? Y  Vision? N  Difficulty concentrating or making decisions? Y  Walking or climbing stairs? Y  Dressing or bathing? N  Doing errands, shopping? Y  Some recent data might be hidden    Patient Care Team: Esaw Grandchild, NP as PCP - General (Family Medicine) Magnus Sinning, MD as Consulting Physician (Physical Medicine and Rehabilitation) Bo Merino, MD as Consulting Physician  (Rheumatology) Irene Shipper, MD as Consulting Physician (Gastroenterology) Webb Laws, [place] as Referring Physician (Optometry)    Assessment:   This is a routine wellness examination for [name].  Exercise Activities and Dietary recommendations House work  Fall Risk Fall Risk  06/17/2018 10/03/2016 09/04/2016 05/11/2015 05/03/2014  Falls in the past year? 0 No No No Yes  Number falls in past yr: - - - - 2 or more  Injury with Fall? - - - - No  Risk Factor Category  - - - - High Fall Risk  Risk for fall due to : - - - -  History of fall(s)  Follow up Falls evaluation completed - - - -   Is the patient's home free of loose throw rugs in walkways, pet beds, electrical cords, etc?   yes      Grab bars in the bathroom? yes      Handrails on the stairs?   yes      Adequate lighting?   yes  Timed Get Up and Go performed: N/A, encounter not performed not in clinic  Depression Screen PHQ 2/9 Scores 06/17/2018 03/05/2018 09/01/2017 07/03/2017  PHQ - 2 Score 0 0 0 0  PHQ- 9 Score 1 1 0 0     Cognitive Function HOH, daughter was on call to help with passing along communication  MMSE - Mini Mental State Exam 07/03/2017 03/05/2017 11/21/2014  Orientation to time 5 2 3   Orientation to Place 4 3 4   Registration 3 3 3   Attention/ Calculation 5 1 2   Recall 0 0 0  Language- name 2 objects 2 2 2   Language- repeat 1 1 1   Language- follow 3 step command - 3 3  Language- read & follow direction 1 1 1   Write a sentence 1 0 1  Copy design 0 1 0  Total score - 17 20     6CIT Screen 06/17/2018  What Year? 4 points  What month? 3 points  What time? 0 points  Count back from 20 4 points  Months in reverse 4 points  Repeat phrase 10 points  Total Score 25    Immunization History  Administered Date(s) Administered  . Influenza Split 12/04/2010, 11/12/2011  . Influenza Whole 11/13/2006, 11/12/2007, 11/17/2008, 11/15/2009  . Influenza, High Dose Seasonal PF 11/07/2014  . Influenza,inj,Quad  PF,6+ Mos 11/11/2012  . Influenza-Unspecified 11/18/2013, 12/11/2015, 11/07/2016, 12/10/2017  . Pneumococcal Conjugate-13 05/03/2014  . Pneumococcal Polysaccharide-23 12/10/2002  . Tdap 03/13/2016    Qualifies for Shingles Vaccine?Yes  Screening Tests Health Maintenance  Topic Date Due  . INFLUENZA VACCINE  09/19/2018  . TETANUS/TDAP  03/13/2026  . DEXA SCAN  Completed  . PNA vac Low Risk Adult  Completed    Cancer Screenings: Lung: Low Dose CT Chest recommended if Age 34-80 years, 30 pack-year currently smoking OR have quit w/in 15years. Patient does not qualify. Breast:  Up to date on Mammogram? No, aged out Up to date of Bone Density/Dexa? No, pt declined Colorectal: N/A, aged out  Additional Screenings: Hepatitis C Screening: pt declined     Plan:   Continue all medications as directed. Remain well hydrated, follow heart healthy diet. Remain as active as tolerated. Suggest sitting on porch or walking in back yard a few times a week to get some fresh air. If you would like to speak with a grief counselor about the recent death of your sister-please call clinic and we can place a referral . 6 month OV with fasting labs. COVID-19 Education: Signs and symptoms of COVID-19 infection were discussed with pt and how to seek care for testing.  The importance of following the Stay at Home order, and when out- Social Distancing and wearing a facial mask were discussed today. I have personally reviewed and noted the following in the patient's chart:   . Medical and social history . Use of alcohol, tobacco or illicit drugs  . Current medications and supplements . Functional ability and status . Nutritional status . Physical activity . Advanced directives . List of other physicians . Hospitalizations, surgeries, and ER visits in previous 12 months .  Vitals . Screenings to include cognitive, depression, and falls . Referrals and appointments  In addition, I have reviewed and  discussed with patient certain preventive protocols, quality metrics, and best practice recommendations. A written personalized care plan for preventive services as well as general preventive health recommendations were provided to patient.   I discussed the assessment and treatment plan with the patient. The patient was provided an opportunity to ask questions and all were answered. The patient agreed with the plan and demonstrated an understanding of the instructions.   The patient was advised to call back or seek an in-person evaluation if the symptoms worsen or if the condition fails to improve as anticipated.    Esaw Grandchild, NP  06/17/2018

## 2018-06-17 NOTE — Assessment & Plan Note (Addendum)
Continue all medications as directed. Remain well hydrated, follow heart healthy diet. Remain as active as tolerated. Suggest sitting on porch or walking in back yard a few times a week to get some fresh air. If you would like to speak with a grief counselor about the recent death of your sister-please call clinic and we can place a referral . 6 month OV with fasting labs. COVID-19 Education: Signs and symptoms of COVID-19 infection were discussed with pt and how to seek care for testing.  The importance of following the Stay at Home order, and when out- Social Distancing and wearing a facial mask were discussed today. I have personally reviewed and noted the following in the patient's chart:   . Medical and social history . Use of alcohol, tobacco or illicit drugs  . Current medications and supplements . Functional ability and status . Nutritional status . Physical activity . Advanced directives . List of other physicians . Hospitalizations, surgeries, and ER visits in previous 12 months . Vitals . Screenings to include cognitive, depression, and falls . Referrals and appointments  In addition, I have reviewed and discussed with patient certain preventive protocols, quality metrics, and best practice recommendations. A written personalized care plan for preventive services as well as general preventive health recommendations were provided to patient.   I discussed the assessment and treatment plan with the patient. The patient was provided an opportunity to ask questions and all were answered. The patient agreed with the plan and demonstrated an understanding of the instructions.   The patient was advised to call back or seek an in-person evaluation if the symptoms worsen or if the condition fails to improve as anticipated.

## 2018-10-07 NOTE — Progress Notes (Deleted)
Virtual Visit via Telephone Note  I connected with [name] on 10/08/2018 at 10:15 AM EDT by telephone and verified that I am speaking with the correct person using two identifiers.  Location: Patient:Home Provider:In Clinic   I discussed the limitations, risks, security and privacy concerns of performing an evaluation and management service by telephone and the availability of in person appointments. I also discussed with the patient that there may be a patient responsible charge related to this service. The patient expressed understanding and agreed to proceed.   History of Present Illness: [name] calls in today with   Patient Care Team    Relationship Specialty Notifications Start End  Esaw Grandchild, NP PCP - General Family Medicine  03/13/16   Magnus Sinning, MD Consulting Physician Physical Medicine and Rehabilitation  03/13/16   Bo Merino, MD Consulting Physician Rheumatology  03/13/16   Irene Shipper, MD Consulting Physician Gastroenterology  03/13/16   Webb Laws, [place] Referring Physician Optometry  03/13/16     Patient Active Problem List   Diagnosis Date Noted  . Encounter for Medicare annual wellness exam 06/17/2018  . Abdominal pain, acute, left lower quadrant 09/01/2017  . Other chest pain 09/01/2017  . CKD (chronic kidney disease), symptom management only, stage 3 (moderate) ([place]) 07/03/2017  . Cognitive changes 03/05/2017  . Healthcare maintenance 03/05/2017  . Hypertension 03/05/2017  . Cervicalgia 10/10/2016  . Lower extremity edema 10/03/2016  . Dyspnea on exertion 10/03/2016  . Right arm pain 09/04/2016  . Migraine 09/04/2016  . Numerous moles 09/04/2016  . Frequent headaches 09/04/2016  . Chronic insomnia 05/11/2015  . Vitamin D deficiency 05/17/2014  . Counseling regarding end of life decision making 05/03/2014  . Mild dementia ([place]) 05/03/2014  . Lumbago 05/11/2012  . Vitamin B 12 deficiency 05/11/2012  . Dysphagia,  pharyngoesophageal phase 12/17/2010  . Esophageal reflux 12/17/2010  . Abdominal hernia 12/17/2010  . Anemia, iron deficiency 12/06/2010  . HYPERCHOLESTEROLEMIA, MILD 11/19/2008  . Venous (peripheral) insufficiency 11/14/2007  . Essential hypertension, benign 05/14/2007  . Arrhythmia 05/14/2007  . CONSTIPATION, CHRONIC 05/14/2007  . Osteoporosis 05/14/2007  . Benign essential tremor 05/14/2007  . HIATAL HERNIA 12/18/2006  . DEGENERATIVE JOINT DISEASE, GENERALIZED 12/18/2006     Past Medical History:  Diagnosis Date  . Abnormal involuntary movements(781.0)   . Anemia   . Anxiety state, unspecified   . Blood transfusion   . Cardiac dysrhythmia, unspecified   . Chronic constipation   . DJD (degenerative joint disease)   . Esophageal stricture   . Generalized osteoarthrosis, unspecified site   . GERD (gastroesophageal reflux disease)   . Hiatal hernia   . Hypertension   . Osteoporosis   . Pelvic adhesions   . Postgastrectomy syndrome   . Pure hypercholesterolemia   . Tortuous colon   . Urinary tract infection, site not specified   . Venous insufficiency   . Vitamin B12 deficiency      Past Surgical History:  Procedure Laterality Date  . CHOLECYSTECTOMY    . COLONOSCOPY    . ESOPHAGUS SURGERY  2003  . mole biopsy- left neck area Left   . NISSEN FUNDOPLICATION    . TOTAL ABDOMINAL HYSTERECTOMY     DUB,   . UPPER GASTROINTESTINAL ENDOSCOPY       Family History  Problem Relation Age of Onset  . Cancer Mother        unknown type, most likely colon  . Heart disease Brother   . Arthritis  Father   . Heart disease Sister      Social History   Substance and Sexual Activity  Drug Use No     Social History   Substance and Sexual Activity  Alcohol Use No  . Alcohol/week: 0.0 standard drinks     Social History   Tobacco Use  Smoking Status Never Smoker  Smokeless Tobacco Never Used     Outpatient Encounter Medications as of 10/08/2018  Medication  Sig  . acetaminophen (TYLENOL) 500 MG tablet Take 1,000 mg by mouth as needed.   . AMBULATORY NON FORMULARY MEDICATION Medication Name: GI Cocktail with 20cc Maalox, 20cc Viscous Lidocaine 2%, take 30cc dose q8hr as needed for GI upset.  Marland Kitchen diltiazem (CARDIZEM CD) 180 MG 24 hr capsule TAKE 1 CAPSULE BY MOUTH EVERY DAY.  Marland Kitchen MELATONIN PO Take by mouth at bedtime.  . metoprolol succinate (TOPROL-XL) 25 MG 24 hr tablet Take 1 tablet (25 mg total) by mouth daily. Take with or immediately following a meal.  . omeprazole (PRILOSEC) 20 MG capsule TAKE 1 CAPSULE BY MOUTH DAILY.  Marland Kitchen polyethylene glycol (MIRALAX / GLYCOLAX) packet Take 17 g by mouth daily as needed.   . pravastatin (PRAVACHOL) 40 MG tablet Take 1 tablet (40 mg total) by mouth every evening.  . traMADol (ULTRAM) 50 MG tablet Take 1 tablet (50 mg total) by mouth every 8 (eight) hours as needed.  . valsartan-hydrochlorothiazide (DIOVAN-HCT) 80-12.5 MG tablet Take 1 tablet by mouth daily.  . [DISCONTINUED] diltiazem (DILACOR XR) 180 MG 24 hr capsule Take 1 capsule (180 mg total) by mouth daily.   No facility-administered encounter medications on file as of 10/08/2018.     Allergies: Patient has no known allergies.  There is no height or weight on file to calculate BMI.  There were no vitals taken for this visit. Review of Systems: General:   Denies fever, chills, unexplained weight loss.  Optho/Auditory:   Denies visual changes, blurred vision/LOV Respiratory:   Denies SOB, DOE more than baseline levels.  Cardiovascular:   Denies chest pain, palpitations, new onset peripheral edema  Gastrointestinal:   Denies nausea, vomiting, diarrhea.  Genitourinary: Denies dysuria, freq/ urgency, flank pain or discharge from genitals.  Endocrine:     Denies hot or cold intolerance, polyuria, polydipsia. Musculoskeletal:   Denies unexplained myalgias, joint swelling, unexplained arthralgias, gait problems.  Skin:  Denies rash, suspicious  lesions Neurological:     Denies dizziness, unexplained weakness, numbness  Psychiatric/Behavioral:   Denies mood changes, suicidal or homicidal ideations, hallucinations      Observations/Objective:   Assessment and Plan:   Follow Up Instructions:    I discussed the assessment and treatment plan with the patient. The patient was provided an opportunity to ask questions and all were answered. The patient agreed with the plan and demonstrated an understanding of the instructions.   The patient was advised to call back or seek an in-person evaluation if the symptoms worsen or if the condition fails to improve as anticipated.  I provided *** minutes of non-face-to-face time during this encounter.   Esaw Grandchild, NP

## 2018-10-07 NOTE — Progress Notes (Addendum)
Subjective:    Patient ID: [name], female    DOB: 01-Jun-1923, 85 y.o.   MRN: [id number]  HPI:  Ms. Rane with change in mental status. Her daughter states "she has been mean as a snake" for the 4 days. The pt denies urinary frequency, dysuria, flank pain, hematuria. She has been using "Depends" and is inconsistent with changing when soiled. She denies any difficulty with ADLs, IADLs She denies CP/chest tightness with exertion She denies dyspnea She denies change in appetite She denies dizziness She denies recent falls  UA- Blood- trace Nitrite- Pos Leu- Small Specimen sent culture/sensitivity   MMSE- 12/30, expected score 26- likely related to UTI  Daughter art BS reports her memory has been in steady decline >3 months  11/21/2014- Neuro/Dr. Verdis Prime - evaluation for tremor and "moderate dementia" She was on Primidone 150g per day at that time, with wishes to wean off medication  Discussed home safety, pt will not entertain any discussion of moving to assisted living facility She is agreeable to [facility] Referral  BMP 09/01/2017- GFR 58, Creat 0.85 Patient Care Team    Relationship Specialty Notifications Start End  Mina Marble D, NP PCP - General Family Medicine  03/13/16   Magnus Sinning, MD Consulting Physician Physical Medicine and Rehabilitation  03/13/16   Bo Merino, MD Consulting Physician Rheumatology  03/13/16   Irene Shipper, MD Consulting Physician Gastroenterology  03/13/16   Webb Laws, [place] Referring Physician Optometry  03/13/16     Patient Active Problem List   Diagnosis Date Noted  . UTI (urinary tract infection) 10/08/2018  . Encounter for Medicare annual wellness exam 06/17/2018  . Abdominal pain, acute, left lower quadrant 09/01/2017  . Other chest pain 09/01/2017  . CKD (chronic kidney disease), symptom management only, stage 3 (moderate) ([place]) 07/03/2017  . Cognitive changes 03/05/2017  . Healthcare maintenance  03/05/2017  . Hypertension 03/05/2017  . Cervicalgia 10/10/2016  . Lower extremity edema 10/03/2016  . Dyspnea on exertion 10/03/2016  . Right arm pain 09/04/2016  . Migraine 09/04/2016  . Numerous moles 09/04/2016  . Frequent headaches 09/04/2016  . Chronic insomnia 05/11/2015  . Vitamin D deficiency 05/17/2014  . Counseling regarding end of life decision making 05/03/2014  . Moderate dementia ([place]) 05/03/2014  . Lumbago 05/11/2012  . Vitamin B 12 deficiency 05/11/2012  . Dysphagia, pharyngoesophageal phase 12/17/2010  . Esophageal reflux 12/17/2010  . Abdominal hernia 12/17/2010  . Anemia, iron deficiency 12/06/2010  . HYPERCHOLESTEROLEMIA, MILD 11/19/2008  . Venous (peripheral) insufficiency 11/14/2007  . Essential hypertension, benign 05/14/2007  . Arrhythmia 05/14/2007  . CONSTIPATION, CHRONIC 05/14/2007  . Osteoporosis 05/14/2007  . Benign essential tremor 05/14/2007  . HIATAL HERNIA 12/18/2006  . DEGENERATIVE JOINT DISEASE, GENERALIZED 12/18/2006     Past Medical History:  Diagnosis Date  . Abnormal involuntary movements(781.0)   . Anemia   . Anxiety state, unspecified   . Blood transfusion   . Cardiac dysrhythmia, unspecified   . Chronic constipation   . DJD (degenerative joint disease)   . Esophageal stricture   . Generalized osteoarthrosis, unspecified site   . GERD (gastroesophageal reflux disease)   . Hiatal hernia   . Hypertension   . Osteoporosis   . Pelvic adhesions   . Postgastrectomy syndrome   . Pure hypercholesterolemia   . Tortuous colon   . Urinary tract infection, site not specified   . Venous insufficiency   . Vitamin B12 deficiency      Past Surgical  History:  Procedure Laterality Date  . CHOLECYSTECTOMY    . COLONOSCOPY    . ESOPHAGUS SURGERY  2003  . mole biopsy- left neck area Left   . NISSEN FUNDOPLICATION    . TOTAL ABDOMINAL HYSTERECTOMY     DUB,   . UPPER GASTROINTESTINAL ENDOSCOPY       Family History  Problem  Relation Age of Onset  . Cancer Mother        unknown type, most likely colon  . Heart disease Brother   . Arthritis Father   . Heart disease Sister      Social History   Substance and Sexual Activity  Drug Use No     Social History   Substance and Sexual Activity  Alcohol Use No  . Alcohol/week: 0.0 standard drinks     Social History   Tobacco Use  Smoking Status Never Smoker  Smokeless Tobacco Never Used     Outpatient Encounter Medications as of 10/08/2018  Medication Sig  . acetaminophen (TYLENOL) 500 MG tablet Take 1,000 mg by mouth as needed.   . AMBULATORY NON FORMULARY MEDICATION Medication Name: GI Cocktail with 20cc Maalox, 20cc Viscous Lidocaine 2%, take 30cc dose q8hr as needed for GI upset.  Marland Kitchen MELATONIN PO Take by mouth at bedtime.  . metoprolol succinate (TOPROL-XL) 25 MG 24 hr tablet 1/2 tablet daily with or immediately following a meal.  . omeprazole (PRILOSEC) 20 MG capsule TAKE 1 CAPSULE BY MOUTH DAILY.  Marland Kitchen polyethylene glycol (MIRALAX / GLYCOLAX) packet Take 17 g by mouth daily as needed.   . pravastatin (PRAVACHOL) 40 MG tablet Take 1 tablet (40 mg total) by mouth every evening.  . traMADol (ULTRAM) 50 MG tablet Take 1 tablet (50 mg total) by mouth every 8 (eight) hours as needed.  . valsartan-hydrochlorothiazide (DIOVAN-HCT) 80-12.5 MG tablet Take 1 tablet by mouth daily.  . [DISCONTINUED] diltiazem (CARDIZEM CD) 180 MG 24 hr capsule TAKE 1 CAPSULE BY MOUTH EVERY DAY.  . [DISCONTINUED] metoprolol succinate (TOPROL-XL) 25 MG 24 hr tablet Take 1 tablet (25 mg total) by mouth daily. Take with or immediately following a meal.  . ciprofloxacin (CIPRO) 500 MG tablet Take 1 tablet (500 mg total) by mouth 2 (two) times daily.  Marland Kitchen diltiazem (CARDIZEM CD) 120 MG 24 hr capsule Take 1 capsule (120 mg total) by mouth daily.  . [DISCONTINUED] diltiazem (DILACOR XR) 180 MG 24 hr capsule Take 1 capsule (180 mg total) by mouth daily.   No facility-administered  encounter medications on file as of 10/08/2018.     Allergies: Patient has no known allergies.  Body mass index is 27.49 kg/m.  Blood pressure 101/64, pulse (!) 44, temperature 98.3 F (36.8 C), temperature source Oral, height 5\' 1"  (1.549 m), weight 145 lb 8 oz (66 kg).   Review of Systems  Constitutional: Positive for fatigue. Negative for activity change, appetite change, chills, diaphoresis, fever and unexpected weight change.  HENT: Negative for congestion.   Eyes: Negative for visual disturbance.  Respiratory: Negative for cough, chest tightness, shortness of breath, wheezing and stridor.   Cardiovascular: Positive for leg swelling. Negative for chest pain and palpitations.  Gastrointestinal: Negative for abdominal distention, anal bleeding, blood in stool, constipation, diarrhea, nausea and vomiting.  Endocrine: Negative for cold intolerance, heat intolerance, polydipsia, polyphagia and polyuria.  Genitourinary: Negative for difficulty urinating, dysuria, flank pain, frequency and hematuria.       Incontinence +  Musculoskeletal: Positive for arthralgias, back pain, gait problem, joint swelling  and myalgias. Negative for neck pain and neck stiffness.  Skin: Negative for color change, pallor, rash and wound.  Neurological: Positive for tremors. Negative for dizziness and headaches.       Decline in memory +  Hematological: Negative for adenopathy. Does not bruise/bleed easily.  Psychiatric/Behavioral: Negative for agitation, behavioral problems, confusion, decreased concentration, dysphoric mood, hallucinations, self-injury, sleep disturbance and suicidal ideas. The patient is not nervous/anxious and is not hyperactive.        Objective:   Physical Exam Constitutional:      General: She is not in acute distress.    Appearance: Normal appearance. She is not ill-appearing, toxic-appearing or diaphoretic.  HENT:     Head: Normocephalic.     Right Ear: Hearing normal.      Left Ear: Hearing normal.  Cardiovascular:     Rate and Rhythm: Normal rate and regular rhythm.  Pulmonary:     Effort: Pulmonary effort is normal. No respiratory distress.     Breath sounds: Normal breath sounds. No stridor. No wheezing, rhonchi or rales.  Chest:     Chest wall: No tenderness.  Abdominal:     Tenderness: There is no right CVA tenderness or left CVA tenderness.  Musculoskeletal:        General: Swelling present.     Right ankle: She exhibits swelling.     Left ankle: She exhibits swelling.  Skin:    General: Skin is warm and dry.     Capillary Refill: Capillary refill takes less than 2 seconds.  Neurological:     Mental Status: She is alert and oriented to person, place, and time.  Psychiatric:        Mood and Affect: Mood normal.        Thought Content: Thought content normal.        Judgment: Judgment normal.        Assessment & Plan:   1. Mental confusion   2. Healthcare maintenance   3. HYPERCHOLESTEROLEMIA, MILD   4. Elevated random blood glucose level   5. Essential hypertension, benign   6. Hypertension, unspecified type   7. CKD (chronic kidney disease), symptom management only, stage 3 (moderate) ([facility])   8. Iron deficiency anemia, unspecified iron deficiency anemia type   9. Abnormal urinalysis   10. Urinary tract infection without hematuria, site unspecified   11. Dyspnea on exertion   12. Moderate dementia without behavioral disturbance ([facility])     UTI (urinary tract infection) UA- Blood- trace Nitrite- Pos Leu- Small Specimen sent culture/sensitivity - will call with results  Please take Cipro 500mg  twice daily, take for 5 days. Changes to maintenance medications- Metoprolol (Toprol-XL) 15m- 1/2 table once daly Diltiazem (Cardizem) reduced from 180mg  to 120mg  once daily- new rx sent in. Remain well hydrated. We will review lab work and re-check urin at follow-up in one week. Home Health order placed. Recommend evaluation by  Neurologist- will discuss next week. Please call clinic with any questions/concerns. FOLLOW-UP ONE WEEK  CKD (chronic kidney disease), symptom management only, stage 3 (moderate) ([facility]) BMP 09/01/2017- GFR 58, Creat 0.85 CMP checked today Will adjust Cipro if needed   Hypertension BP 101/64 HR 44 Decreased Metoprolol XL 25mg  QD to 1/2 tablet daily Diltiazem CD 180mg  reduced to 120 CD mg QD Continue Valsartan/HCTZ 80/12.5mg  QD F/u in one week Advised to purchase home upper arm cuff- take BP/HR daily record If consistently <100/60 or >140/90 or HR <50 or >100- call clinic   Dyspnea on  exertion Denies any at current   HYPERCHOLESTEROLEMIA, MILD Currently on Pravastatin 40mg  QD  Moderate dementia ([place]) MMSE- 12/30, expected score 26- likely related to UTI  Daughter art BS reports her memory has been in steady decline >3 months  11/21/2014- Neuro/Dr. Verdis Prime - evaluation for tremor and "moderate dementia" She was on Primidone 150g per day at that time, with wishes to wean off medication  Discussed home safety, pt will not entertain any discussion of moving to assisted living facility She is agreeable to [facility] Referral  Recommend Neurology evaluation- will address at f/u next week    FOLLOW-UP:  Return in about 1 week (around 10/15/2018).

## 2018-10-08 ENCOUNTER — Encounter: Payer: Self-pay | Admitting: Adult Health

## 2018-10-08 ENCOUNTER — Other Ambulatory Visit: Payer: Self-pay

## 2018-10-08 ENCOUNTER — Ambulatory Visit (INDEPENDENT_AMBULATORY_CARE_PROVIDER_SITE_OTHER): Payer: Medicare Other | Admitting: Adult Health

## 2018-10-08 VITALS — BP 101/64 | HR 44 | Temp 98.3°F | Ht 61.0 in | Wt 145.5 lb

## 2018-10-08 DIAGNOSIS — N390 Urinary tract infection, site not specified: Secondary | ICD-10-CM

## 2018-10-08 DIAGNOSIS — F0390 Unspecified dementia without behavioral disturbance: Secondary | ICD-10-CM

## 2018-10-08 DIAGNOSIS — Z0000 Encounter for general adult medical examination without abnormal findings: Secondary | ICD-10-CM

## 2018-10-08 DIAGNOSIS — R479 Unspecified speech disturbances: Secondary | ICD-10-CM

## 2018-10-08 DIAGNOSIS — R7309 Other abnormal glucose: Secondary | ICD-10-CM

## 2018-10-08 DIAGNOSIS — E7800 Pure hypercholesterolemia, unspecified: Secondary | ICD-10-CM

## 2018-10-08 DIAGNOSIS — R739 Hyperglycemia, unspecified: Secondary | ICD-10-CM

## 2018-10-08 DIAGNOSIS — R410 Disorientation, unspecified: Secondary | ICD-10-CM

## 2018-10-08 DIAGNOSIS — I10 Essential (primary) hypertension: Secondary | ICD-10-CM

## 2018-10-08 DIAGNOSIS — N183 Chronic kidney disease, stage 3 unspecified: Secondary | ICD-10-CM

## 2018-10-08 DIAGNOSIS — R8290 Unspecified abnormal findings in urine: Secondary | ICD-10-CM

## 2018-10-08 DIAGNOSIS — F03B0 Unspecified dementia, moderate, without behavioral disturbance, psychotic disturbance, mood disturbance, and anxiety: Secondary | ICD-10-CM

## 2018-10-08 DIAGNOSIS — R0609 Other forms of dyspnea: Secondary | ICD-10-CM

## 2018-10-08 DIAGNOSIS — D509 Iron deficiency anemia, unspecified: Secondary | ICD-10-CM

## 2018-10-08 LAB — POCT URINALYSIS DIPSTICK
Bilirubin, UA: NEGATIVE
Glucose, UA: NEGATIVE
Ketones, UA: NEGATIVE
Nitrite, UA: POSITIVE
Protein, UA: POSITIVE — AB
Spec Grav, UA: 1.02 (ref 1.010–1.025)
Urobilinogen, UA: 1 E.U./dL
pH, UA: 6 (ref 5.0–8.0)

## 2018-10-08 MED ORDER — METOPROLOL SUCCINATE ER 25 MG PO TB24: ORAL_TABLET | ORAL | 2 refills | Status: DC

## 2018-10-08 MED ORDER — CIPROFLOXACIN HCL 500 MG PO TABS: 500.0000 mg | ORAL_TABLET | Freq: Two times a day (BID) | ORAL | 0 refills | Status: DC

## 2018-10-08 MED ORDER — DILTIAZEM HCL ER COATED BEADS 120 MG PO CP24: 120.0000 mg | ORAL_CAPSULE | Freq: Every day | ORAL | 0 refills | Status: DC

## 2018-10-08 NOTE — Assessment & Plan Note (Addendum)
BMP 09/01/2017- GFR 58, Creat 0.85 CMP checked today Will adjust Cipro if needed

## 2018-10-08 NOTE — Assessment & Plan Note (Signed)
Currently on Pravastatin 40mg  QD

## 2018-10-08 NOTE — Assessment & Plan Note (Addendum)
UA- Blood- trace Nitrite- Pos Leu- Small Specimen sent culture/sensitivity - will call with results  Please take Cipro 500mg  twice daily, take for 5 days. Changes to maintenance medications- Metoprolol (Toprol-XL) 61m- 1/2 table once daly Diltiazem (Cardizem) reduced from 180mg  to 120mg  once daily- new rx sent in. Remain well hydrated. We will review lab work and re-check urin at follow-up in one week. Home Health order placed. Recommend evaluation by Neurologist- will discuss next week. Please call clinic with any questions/concerns. FOLLOW-UP ONE WEEK

## 2018-10-08 NOTE — Patient Instructions (Addendum)
Urinary Tract Infection, Adult A urinary tract infection (UTI) is an infection of any part of the urinary tract. The urinary tract includes:  The kidneys.  The ureters.  The bladder.  The urethra. These organs make, store, and get rid of pee (urine) in the body. What are the causes? This is caused by germs (bacteria) in your genital area. These germs grow and cause swelling (inflammation) of your urinary tract. What increases the risk? You are more likely to develop this condition if:  You have a small, thin tube (catheter) to drain pee.  You cannot control when you pee or poop (incontinence).  You are female, and: ? You use these methods to prevent pregnancy: ? A medicine that kills sperm (spermicide). ? A device that blocks sperm (diaphragm). ? You have low levels of a female hormone (estrogen). ? You are pregnant.  You have genes that add to your risk.  You are sexually active.  You take antibiotic medicines.  You have trouble peeing because of: ? A prostate that is bigger than normal, if you are female. ? A blockage in the part of your body that drains pee from the bladder (urethra). ? A kidney stone. ? A nerve condition that affects your bladder (neurogenic bladder). ? Not getting enough to drink. ? Not peeing often enough.  You have other conditions, such as: ? Diabetes. ? A weak disease-fighting system (immune system). ? Sickle cell disease. ? Gout. ? Injury of the spine. What are the signs or symptoms? Symptoms of this condition include:  Needing to pee right away (urgently).  Peeing often.  Peeing small amounts often.  Pain or burning when peeing.  Blood in the pee.  Pee that smells bad or not like normal.  Trouble peeing.  Pee that is cloudy.  Fluid coming from the vagina, if you are female.  Pain in the belly or lower back. Other symptoms include:  Throwing up (vomiting).  No urge to eat.  Feeling mixed up (confused).  Being tired  and grouchy (irritable).  A fever.  Watery poop (diarrhea). How is this treated? This condition may be treated with:  Antibiotic medicine.  Other medicines.  Drinking enough water. Follow these instructions at home:  Medicines  Take over-the-counter and prescription medicines only as told by your doctor.  If you were prescribed an antibiotic medicine, take it as told by your doctor. Do not stop taking it even if you start to feel better. General instructions  Make sure you: ? Pee until your bladder is empty. ? Do not hold pee for a long time. ? Empty your bladder after sex. ? Wipe from front to back after pooping if you are a female. Use each tissue one time when you wipe.  Drink enough fluid to keep your pee pale yellow.  Keep all follow-up visits as told by your doctor. This is important. Contact a doctor if:  You do not get better after 1-2 days.  Your symptoms go away and then come back. Get help right away if:  You have very bad back pain.  You have very bad pain in your lower belly.  You have a fever.  You are sick to your stomach (nauseous).  You are throwing up. Summary  A urinary tract infection (UTI) is an infection of any part of the urinary tract.  This condition is caused by germs in your genital area.  There are many risk factors for a UTI. These include having a small, thin   tube to drain pee and not being able to control when you pee or poop.  Treatment includes antibiotic medicines for germs.  Drink enough fluid to keep your pee pale yellow. This information is not intended to replace advice given to you by your health care provider. Make sure you discuss any questions you have with your health care provider. Document Released: 07/24/2007 Document Revised: 01/22/2018 Document Reviewed: 08/14/2017 Elsevier Patient Education  [place].  Please take Cipro 500mg  twice daily, take for 5 days. Changes to maintenance medications-  Metoprolol (Toprol-XL) 76m- 1/2 table once daly Diltiazem (Cardizem) reduced from 180mg  to 120mg  once daily- new rx sent in. Remain well hydrated. We will review lab work and re-check urin at follow-up in one week. Home Health order placed. Recommend evaluation by Neurologist- will discuss next week. Please call clinic with any questions/concerns. FOLLOW-UP ONE WEEK

## 2018-10-08 NOTE — Assessment & Plan Note (Signed)
MMSE- 12/30, expected score 26- likely related to UTI  Daughter art BS reports her memory has been in steady decline >3 months  11/21/2014- Neuro/Dr. Verdis Prime - evaluation for tremor and "moderate dementia" She was on Primidone 150g per day at that time, with wishes to wean off medication  Discussed home safety, pt will not entertain any discussion of moving to assisted living facility She is agreeable to [facility] Referral  Recommend Neurology evaluation- will address at f/u next week

## 2018-10-08 NOTE — Assessment & Plan Note (Signed)
Denies any at current

## 2018-10-08 NOTE — Addendum Note (Signed)
Addended by: Fonnie Mu on: 10/08/2018 01:58 PM   Modules accepted: Orders

## 2018-10-08 NOTE — Assessment & Plan Note (Signed)
BP 101/64 HR 44 Decreased Metoprolol XL 25mg  QD to 1/2 tablet daily Diltiazem CD 180mg  reduced to 120 CD mg QD Continue Valsartan/HCTZ 80/12.5mg  QD F/u in one week Advised to purchase home upper arm cuff- take BP/HR daily record If consistently <100/60 or >140/90 or HR <50 or >100- call clinic

## 2018-10-09 LAB — COMPREHENSIVE METABOLIC PANEL
ALT: 14 IU/L (ref 0–32)
AST: 25 IU/L (ref 0–40)
Albumin/Globulin Ratio: 1.7 (ref 1.2–2.2)
Albumin: 4 g/dL (ref 3.5–4.6)
Alkaline Phosphatase: 97 IU/L (ref 39–117)
BUN/Creatinine Ratio: 16 (ref 12–28)
BUN: 20 mg/dL (ref 10–36)
Bilirubin Total: 0.7 mg/dL (ref 0.0–1.2)
CO2: 22 mmol/L (ref 20–29)
Calcium: 9.1 mg/dL (ref 8.7–10.3)
Chloride: 102 mmol/L (ref 96–106)
Creatinine, Ser: 1.24 mg/dL — ABNORMAL HIGH (ref 0.57–1.00)
GFR calc Af Amer: 44 mL/min/{1.73_m2} — ABNORMAL LOW (ref >59)
GFR calc non Af Amer: 38 mL/min/{1.73_m2} — ABNORMAL LOW (ref >59)
Globulin, Total: 2.4 g/dL (ref 1.5–4.5)
Glucose: 92 mg/dL (ref 65–99)
Potassium: 3.8 mmol/L (ref 3.5–5.2)
Sodium: 140 mmol/L (ref 134–144)
Total Protein: 6.4 g/dL (ref 6.0–8.5)

## 2018-10-09 LAB — LIPID PANEL
Chol/HDL Ratio: 2.4 ratio (ref 0.0–4.4)
Cholesterol, Total: 106 mg/dL (ref 100–199)
HDL: 44 mg/dL (ref >39)
LDL Calculated: 46 mg/dL (ref 0–99)
Triglycerides: 79 mg/dL (ref 0–149)
VLDL Cholesterol Cal: 16 mg/dL (ref 5–40)

## 2018-10-09 LAB — CBC WITH DIFFERENTIAL/PLATELET
Basophils Absolute: 0.1 10*3/uL (ref 0.0–0.2)
Basos: 1 %
EOS (ABSOLUTE): 0.2 10*3/uL (ref 0.0–0.4)
Eos: 2 %
Hematocrit: 35.2 % (ref 34.0–46.6)
Hemoglobin: 11.8 g/dL (ref 11.1–15.9)
Immature Grans (Abs): 0 10*3/uL (ref 0.0–0.1)
Immature Granulocytes: 0 %
Lymphocytes Absolute: 2.3 10*3/uL (ref 0.7–3.1)
Lymphs: 28 %
MCH: 30.6 pg (ref 26.6–33.0)
MCHC: 33.5 g/dL (ref 31.5–35.7)
MCV: 91 fL (ref 79–97)
Monocytes Absolute: 0.6 10*3/uL (ref 0.1–0.9)
Monocytes: 7 %
Neutrophils Absolute: 5.1 10*3/uL (ref 1.4–7.0)
Neutrophils: 62 %
Platelets: 289 10*3/uL (ref 150–450)
RBC: 3.86 x10E6/uL (ref 3.77–5.28)
RDW: 12.7 % (ref 11.7–15.4)
WBC: 8.2 10*3/uL (ref 3.4–10.8)

## 2018-10-09 LAB — HEMOGLOBIN A1C
Est. average glucose Bld gHb Est-mCnc: 105 mg/dL
Hgb A1c MFr Bld: 5.3 % (ref 4.8–5.6)

## 2018-10-09 LAB — VITAMIN B12: Vitamin B-12: 293 pg/mL (ref 232–1245)

## 2018-10-09 LAB — TSH: TSH: 2.45 u[IU]/mL (ref 0.450–4.500)

## 2018-10-11 LAB — CULTURE, URINE COMPREHENSIVE

## 2018-10-12 ENCOUNTER — Telehealth: Payer: Self-pay | Admitting: Adult Health

## 2018-10-12 NOTE — Telephone Encounter (Signed)
Cipro dosage decreased due to GFR

## 2018-10-12 NOTE — Progress Notes (Addendum)
Subjective:    Patient ID: [name], female    DOB: 03-24-1923, 85 y.o.   MRN: [id number]  HPI:[name] with change in mental status. Her daughter states "she has been mean as a snake" for the 4 days. The pt denies urinary frequency, dysuria, flank pain, hematuria. She has been using "Depends" and is inconsistent with changing when soiled. She denies any difficulty with ADLs, IADLs She denies CP/chest tightness with exertion She denies dyspnea She denies change in appetite She denies dizziness She denies recent falls  UA- Blood- trace Nitrite- Pos Leu- Small Specimen sent culture/sensitivity   MMSE- 12/30, expected score 26- likely related to UTI  Daughter art BS reports her memory has been in steady decline >3 months  11/21/2014- Neuro/Dr. Verdis Prime - evaluation for tremor and "moderate dementia" She was on Primidone 150g per day at that time, with wishes to wean off medication  Discussed home safety, pt will not entertain any discussion of moving to assisted living facility She is agreeable to [facility] Referral  BMP 09/01/2017- GFR 58, Creat 0.85  10/13/2018 OV:  [name] is here for 1 week f/u- UTI, low BP, bradycardia, change in mental status. Toprol XL 25mg  dereased to 12.5,g QD for bradycardia She denies CP/chest tightness/dizziness/palpitations  10/08/2018 Labs- reviewed at length with pt and her daughter Decreased Cipro from 500mg  BID to 250mg  BID due to decrease in GFR CMP repeated today Avoid NSAIDs  She states "I feel better" She denies CP/chest tightness with exertion She denies urinary sx's She denies flank pain She denies hematuria/hematochezia Per pt and family (daughter at [facility])- she is eating and drinking normally Per daughter her behavior is back to baseline   Patient Care Team    Relationship Specialty Notifications Start End  Mina Marble D, NP PCP - General Family Medicine  03/13/16   Magnus Sinning, MD Consulting  Physician Physical Medicine and Rehabilitation  03/13/16   Bo Merino, MD Consulting Physician Rheumatology  03/13/16   Irene Shipper, MD Consulting Physician Gastroenterology  03/13/16   Webb Laws, [place] Referring Physician Optometry  03/13/16     Patient Active Problem List   Diagnosis Date Noted  . UTI (urinary tract infection) 10/08/2018  . Encounter for Medicare annual wellness exam 06/17/2018  . Abdominal pain, acute, left lower quadrant 09/01/2017  . Other chest pain 09/01/2017  . CKD (chronic kidney disease), symptom management only, stage 3 (moderate) ([place]) 07/03/2017  . Cognitive changes 03/05/2017  . Healthcare maintenance 03/05/2017  . Hypertension 03/05/2017  . Cervicalgia 10/10/2016  . Lower extremity edema 10/03/2016  . Dyspnea on exertion 10/03/2016  . Right arm pain 09/04/2016  . Migraine 09/04/2016  . Numerous moles 09/04/2016  . Frequent headaches 09/04/2016  . Chronic insomnia 05/11/2015  . Vitamin D deficiency 05/17/2014  . Counseling regarding end of life decision making 05/03/2014  . Moderate dementia ([place]) 05/03/2014  . Lumbago 05/11/2012  . Vitamin B 12 deficiency 05/11/2012  . Dysphagia, pharyngoesophageal phase 12/17/2010  . Esophageal reflux 12/17/2010  . Abdominal hernia 12/17/2010  . Anemia, iron deficiency 12/06/2010  . HYPERCHOLESTEROLEMIA, MILD 11/19/2008  . Venous (peripheral) insufficiency 11/14/2007  . Essential hypertension, benign 05/14/2007  . Arrhythmia 05/14/2007  . CONSTIPATION, CHRONIC 05/14/2007  . Osteoporosis 05/14/2007  . Benign essential tremor 05/14/2007  . HIATAL HERNIA 12/18/2006  . DEGENERATIVE JOINT DISEASE, GENERALIZED 12/18/2006     Past Medical History:  Diagnosis Date  . Abnormal involuntary movements(781.0)   . Anemia   .  Anxiety state, unspecified   . Blood transfusion   . Cardiac dysrhythmia, unspecified   . Chronic constipation   . DJD (degenerative joint disease)   . Esophageal stricture   .  Generalized osteoarthrosis, unspecified site   . GERD (gastroesophageal reflux disease)   . Hiatal hernia   . Hypertension   . Osteoporosis   . Pelvic adhesions   . Postgastrectomy syndrome   . Pure hypercholesterolemia   . Tortuous colon   . Urinary tract infection, site not specified   . Venous insufficiency   . Vitamin B12 deficiency      Past Surgical History:  Procedure Laterality Date  . CHOLECYSTECTOMY    . COLONOSCOPY    . ESOPHAGUS SURGERY  2003  . mole biopsy- left neck area Left   . NISSEN FUNDOPLICATION    . TOTAL ABDOMINAL HYSTERECTOMY     DUB,   . UPPER GASTROINTESTINAL ENDOSCOPY       Family History  Problem Relation Age of Onset  . Cancer Mother        unknown type, most likely colon  . Heart disease Brother   . Arthritis Father   . Heart disease Sister      Social History   Substance and Sexual Activity  Drug Use No     Social History   Substance and Sexual Activity  Alcohol Use No  . Alcohol/week: 0.0 standard drinks     Social History   Tobacco Use  Smoking Status Never Smoker  Smokeless Tobacco Never Used     Outpatient Encounter Medications as of 10/13/2018  Medication Sig  . acetaminophen (TYLENOL) 500 MG tablet Take 1,000 mg by mouth as needed.   . AMBULATORY NON FORMULARY MEDICATION Medication Name: GI Cocktail with 20cc Maalox, 20cc Viscous Lidocaine 2%, take 30cc dose q8hr as needed for GI upset.  . ciprofloxacin (CIPRO) 500 MG tablet Take 1 tablet (500 mg total) by mouth 2 (two) times daily. (Patient taking differently: Take 250 mg by mouth 2 (two) times daily. )  . diltiazem (CARDIZEM CD) 120 MG 24 hr capsule Take 1 capsule (120 mg total) by mouth daily.  Marland Kitchen MELATONIN PO Take by mouth at bedtime.  . metoprolol succinate (TOPROL-XL) 25 MG 24 hr tablet 1/2 tablet daily with or immediately following a meal.  . omeprazole (PRILOSEC) 20 MG capsule TAKE 1 CAPSULE BY MOUTH DAILY.  Marland Kitchen polyethylene glycol (MIRALAX / GLYCOLAX)  packet Take 17 g by mouth daily as needed.   . pravastatin (PRAVACHOL) 40 MG tablet Take 1 tablet (40 mg total) by mouth every evening.  . traMADol (ULTRAM) 50 MG tablet Take 1 tablet (50 mg total) by mouth every 8 (eight) hours as needed.  . valsartan-hydrochlorothiazide (DIOVAN-HCT) 80-12.5 MG tablet Take 1 tablet by mouth daily.  . [DISCONTINUED] diltiazem (DILACOR XR) 180 MG 24 hr capsule Take 1 capsule (180 mg total) by mouth daily.   No facility-administered encounter medications on file as of 10/13/2018.     Allergies: Patient has no known allergies.  Body mass index is 27.02 kg/m.  Blood pressure 126/61, pulse (!) 55, temperature 98.3 F (36.8 C), temperature source Oral, height 5\' 1"  (1.549 m), weight 143 lb (64.9 kg), SpO2 97 %.     Review of Systems  Constitutional: Positive for fatigue. Negative for activity change, appetite change, chills, diaphoresis, fever and unexpected weight change.  Eyes: Negative for visual disturbance.  Respiratory: Negative for cough, chest tightness, shortness of breath, wheezing and stridor.  Cardiovascular: Negative for chest pain, palpitations and leg swelling.  Gastrointestinal: Negative for abdominal distention, anal bleeding, blood in stool, constipation, diarrhea, nausea and vomiting.  Genitourinary: Positive for difficulty urinating. Negative for flank pain, genital sores, hematuria and urgency.       Urinary incontinence +  Musculoskeletal: Positive for arthralgias, back pain, gait problem, joint swelling, myalgias and neck pain.  Hematological: Negative for adenopathy. Does not bruise/bleed easily.  Psychiatric/Behavioral: Negative for agitation, behavioral problems, confusion, decreased concentration, dysphoric mood, hallucinations, self-injury, sleep disturbance and suicidal ideas. The patient is not nervous/anxious and is not hyperactive.        Objective:   Physical Exam Vitals signs and nursing note reviewed.   Constitutional:      General: She is not in acute distress.    Appearance: Normal appearance. She is normal weight. She is not ill-appearing, toxic-appearing or diaphoretic.  HENT:     Right Ear: Decreased hearing noted.     Left Ear: Decreased hearing noted.  Cardiovascular:     Rate and Rhythm: Regular rhythm. Bradycardia present.     Pulses: Normal pulses.     Heart sounds: Normal heart sounds. No murmur. No friction rub. No gallop.   Pulmonary:     Effort: Pulmonary effort is normal. No respiratory distress.     Breath sounds: Normal breath sounds. No stridor. No wheezing, rhonchi or rales.  Chest:     Chest wall: No tenderness.  Abdominal:     General: Abdomen is flat. Bowel sounds are normal. There is no distension.     Palpations: Abdomen is soft. There is no mass.     Tenderness: There is no abdominal tenderness. There is no right CVA tenderness, left CVA tenderness, guarding or rebound.     Hernia: No hernia is present.  Skin:    General: Skin is warm and dry.     Capillary Refill: Capillary refill takes less than 2 seconds.  Neurological:     Mental Status: She is alert and oriented to person, place, and time.  Psychiatric:        Mood and Affect: Mood normal.        Behavior: Behavior normal.        Thought Content: Thought content normal.        Judgment: Judgment normal.       Assessment & Plan:   1. CKD (chronic kidney disease), symptom management only, stage 3 (moderate) ([place])   2. Urinary tract infection without hematuria, site unspecified   3. Healthcare maintenance     Healthcare maintenance Your blood pressure and heart rate are improved. Your Urinalysis is normal. We will call you when your lab results are available. She reduced Cipro dose yesterday due to GFR, instructed to stop today Increase plain water intake, follow Mediterranean diet. Continue all other medications as directed. Please check blood pressure and heart rate couple times/week-  please call clinic if BP consistently >140/90 or <100/60 or HR <55 or >100 Of if your notice any significant changes in health status. Continue to social distance and wear a mask when in public. Follow-up in 3 months, sooner if needed.  CKD (chronic kidney disease), symptom management only, stage 3 (moderate) ([place]) 10/08/2018 Labs- reviewed at length with pt and her daughter Decreased Cipro from 500mg  BID to 250mg  BID due to decrease in GFR She reduced Cipro dose yesterday due to GFR, instructed to stop today CMP repeated today Avoid NSAIDs  UTI (urinary tract infection) UA: Blo- Trace,  intact Nit- Neg Leu- Neg Continue with Cipro 500mg  1/2 tab BID    FOLLOW-UP:  Return in about 3 months (around 01/13/2019) for Regular Follow Up.

## 2018-10-12 NOTE — Telephone Encounter (Signed)
Pt's daughter Ms. Christoper Fabian informed of medication change.  Ms. Christoper Fabian expressed understanding and is agreeable.  Charyl Bigger, CMA

## 2018-10-13 ENCOUNTER — Encounter: Payer: Self-pay | Admitting: Adult Health

## 2018-10-13 ENCOUNTER — Ambulatory Visit (INDEPENDENT_AMBULATORY_CARE_PROVIDER_SITE_OTHER): Payer: Medicare Other | Admitting: Adult Health

## 2018-10-13 ENCOUNTER — Other Ambulatory Visit: Payer: Self-pay

## 2018-10-13 VITALS — BP 126/61 | HR 55 | Temp 98.3°F | Ht 61.0 in | Wt 143.0 lb

## 2018-10-13 DIAGNOSIS — N183 Chronic kidney disease, stage 3 unspecified: Secondary | ICD-10-CM

## 2018-10-13 DIAGNOSIS — Z0000 Encounter for general adult medical examination without abnormal findings: Secondary | ICD-10-CM

## 2018-10-13 DIAGNOSIS — N390 Urinary tract infection, site not specified: Secondary | ICD-10-CM | POA: Diagnosis not present

## 2018-10-13 LAB — POCT URINALYSIS DIPSTICK
Bilirubin, UA: NEGATIVE
Glucose, UA: NEGATIVE
Ketones, UA: NEGATIVE
Leukocytes, UA: NEGATIVE
Nitrite, UA: NEGATIVE
Protein, UA: NEGATIVE
Spec Grav, UA: 1.025 (ref 1.010–1.025)
Urobilinogen, UA: 0.2 E.U./dL
pH, UA: 6.5 (ref 5.0–8.0)

## 2018-10-13 NOTE — Assessment & Plan Note (Addendum)
UA: Blo- Trace, intact Nit- Neg Leu- Neg Continue with Cipro 500mg  1/2 tab BID

## 2018-10-13 NOTE — Assessment & Plan Note (Addendum)
10/08/2018 Labs- reviewed at length with pt and her daughter Decreased Cipro from 500mg  BID to 250mg  BID due to decrease in GFR She reduced Cipro dose yesterday due to GFR, instructed to stop today CMP repeated today Avoid NSAIDs

## 2018-10-13 NOTE — Patient Instructions (Addendum)
Mediterranean Diet A Mediterranean diet refers to food and lifestyle choices that are based on the traditions of countries located on the [employer]. This way of eating has been shown to help prevent certain conditions and improve outcomes for people who have chronic diseases, like kidney disease and heart disease. What are tips for following this plan? Lifestyle  Cook and eat meals together with your family, when possible.  Drink enough fluid to keep your urine clear or pale yellow.  Be physically active every day. This includes: ? Aerobic exercise like running or swimming. ? Leisure activities like gardening, walking, or housework.  Get 7-8 hours of sleep each night.   Reading food labels   Check the serving size of packaged foods. For foods such as rice and pasta, the serving size refers to the amount of cooked product, not dry.  Check the total fat in packaged foods. Avoid foods that have saturated fat or trans fats.  Check the ingredients list for added sugars, such as corn syrup. Shopping  At the grocery store, buy most of your food from the areas near the walls of the store. This includes: ? Fresh fruits and vegetables (produce). ? Grains, beans, nuts, and seeds. Some of these may be available in unpackaged forms or large amounts (in bulk). ? Fresh seafood. ? Poultry and eggs. ? Low-fat dairy products.  Buy whole ingredients instead of prepackaged foods.  Buy fresh fruits and vegetables in-season from local farmers markets.  Buy frozen fruits and vegetables in resealable bags.  If you do not have access to quality fresh seafood, buy precooked frozen shrimp or canned fish, such as tuna, salmon, or sardines.  Buy small amounts of raw or cooked vegetables, salads, or olives from the deli or salad bar at your store.  Stock your pantry so you always have certain foods on hand, such as olive oil, canned tuna, canned tomatoes, rice, pasta, and beans. Cooking  Cook  foods with extra-virgin olive oil instead of using butter or other vegetable oils.  Have meat as a side dish, and have vegetables or grains as your main dish. This means having meat in small portions or adding small amounts of meat to foods like pasta or stew.  Use beans or vegetables instead of meat in common dishes like chili or lasagna.  Experiment with different cooking methods. Try roasting or broiling vegetables instead of steaming or sauteing them.  Add frozen vegetables to soups, stews, pasta, or rice.  Add nuts or seeds for added healthy fat at each meal. You can add these to yogurt, salads, or vegetable dishes.  Marinate fish or vegetables using olive oil, lemon juice, garlic, and fresh herbs. Meal planning   Plan to eat 1 vegetarian meal one day each week. Try to work up to 2 vegetarian meals, if possible.  Eat seafood 2 or more times a week.  Have healthy snacks readily available, such as: ? Vegetable sticks with hummus. ? [place] yogurt. ? Fruit and nut trail mix.  Eat balanced meals throughout the week. This includes: ? Fruit: 2-3 servings a day ? Vegetables: 4-5 servings a day ? Low-fat dairy: 2 servings a day ? Fish, poultry, or lean meat: 1 serving a day ? Beans and legumes: 2 or more servings a week ? Nuts and seeds: 1-2 servings a day ? Whole grains: 6-8 servings a day ? Extra-virgin olive oil: 3-4 servings a day  Limit red meat and sweets to only a few servings a month  What are my food choices?  Mediterranean diet ? Recommended  Grains: Whole-grain pasta. Brown rice. Bulgar wheat. Polenta. Couscous. Whole-wheat bread. [place].  Vegetables: Artichokes. Beets. Broccoli. Cabbage. Carrots. Eggplant. Green beans. Chard. Kale. Spinach. Onions. Leeks. Peas. Squash. Tomatoes. Peppers. Radishes.  Fruits: Apples. Apricots. Avocado. Berries. Bananas. Cherries. Dates. Figs. Grapes. Lemons. Melon. Oranges. Peaches. Plums. Pomegranate.  Meats and other  protein foods: Beans. Almonds. Sunflower seeds. Pine nuts. Peanuts. [place]. Salmon. Scallops. Shrimp. [place]. Tilapia. Clams. Oysters. Eggs.  Dairy: Low-fat milk. Cheese. Greek yogurt.  Beverages: Water. Red wine. Herbal tea.  Fats and oils: Extra virgin olive oil. Avocado oil. Grape seed oil.  Sweets and desserts: [place] yogurt with honey. Baked apples. Poached pears. Trail mix.  Seasoning and other foods: Basil. Cilantro. Coriander. Cumin. Mint. Parsley. Sage. Rosemary. Tarragon. Garlic. Oregano. Thyme. Pepper. Balsalmic vinegar. Tahini. Hummus. Tomato sauce. Olives. Mushrooms. ? Limit these  Grains: Prepackaged pasta or rice dishes. Prepackaged cereal with added sugar.  Vegetables: Deep fried potatoes (french fries).  Fruits: Fruit canned in syrup.  Meats and other protein foods: Beef. Pork. Lamb. Poultry with skin. Hot dogs. [place] Salines.  Dairy: Ice cream. Sour cream. Whole milk.  Beverages: Juice. Sugar-sweetened soft drinks. Beer. Liquor and spirits.  Fats and oils: Butter. Canola oil. Vegetable oil. Beef fat (tallow). Lard.  Sweets and desserts: Cookies. Cakes. Pies. Candy.  Seasoning and other foods: Mayonnaise. Premade sauces and marinades. The items listed may not be a complete list. Talk with your dietitian about what dietary choices are right for you. Summary  The Mediterranean diet includes both food and lifestyle choices.  Eat a variety of fresh fruits and vegetables, beans, nuts, seeds, and whole grains.  Limit the amount of red meat and sweets that you eat.   This information is not intended to replace advice given to you by your health care provider. Make sure you discuss any questions you have with your health care provider. Document Released: 09/28/2015 Document Revised: 10/05/2015 Document Reviewed: 09/28/2015 Elsevier Patient Education  2020 [place] blood pressure and heart rate are improved. Your Urinalysis is normal. We will call you when your  lab results are available. She reduced Cipro dose yesterday due to GFR, instructed to stop today Increase plain water intake, follow Mediterranean diet. Continue all other medications as directed. Please check blood pressure and heart rate couple times/week- please call clinic if BP consistently >140/90 or <100/60 or HR <55 or >100 Of if your notice any significant changes in health status. Continue to social distance and wear a mask when in public. Follow-up in 3 months, sooner if needed. GREAT TO SEE YOU!

## 2018-10-13 NOTE — Assessment & Plan Note (Addendum)
Your blood pressure and heart rate are improved. Your Urinalysis is normal. We will call you when your lab results are available. She reduced Cipro dose yesterday due to GFR, instructed to stop today Increase plain water intake, follow Mediterranean diet. Continue all other medications as directed. Please check blood pressure and heart rate couple times/week- please call clinic if BP consistently >140/90 or <100/60 or HR <55 or >100 Of if your notice any significant changes in health status. Continue to social distance and wear a mask when in public. Follow-up in 3 months, sooner if needed.

## 2018-10-14 LAB — COMPREHENSIVE METABOLIC PANEL
ALT: 13 IU/L (ref 0–32)
AST: 23 IU/L (ref 0–40)
Albumin/Globulin Ratio: 1.9 (ref 1.2–2.2)
Albumin: 4.2 g/dL (ref 3.5–4.6)
Alkaline Phosphatase: 88 IU/L (ref 39–117)
BUN/Creatinine Ratio: 15 (ref 12–28)
BUN: 15 mg/dL (ref 10–36)
Bilirubin Total: 0.6 mg/dL (ref 0.0–1.2)
CO2: 24 mmol/L (ref 20–29)
Calcium: 9.6 mg/dL (ref 8.7–10.3)
Chloride: 103 mmol/L (ref 96–106)
Creatinine, Ser: 1.02 mg/dL — ABNORMAL HIGH (ref 0.57–1.00)
GFR calc Af Amer: 55 mL/min/{1.73_m2} — ABNORMAL LOW (ref >59)
GFR calc non Af Amer: 48 mL/min/{1.73_m2} — ABNORMAL LOW (ref >59)
Globulin, Total: 2.2 g/dL (ref 1.5–4.5)
Glucose: 85 mg/dL (ref 65–99)
Potassium: 4.1 mmol/L (ref 3.5–5.2)
Sodium: 142 mmol/L (ref 134–144)
Total Protein: 6.4 g/dL (ref 6.0–8.5)

## 2018-12-15 NOTE — Progress Notes (Signed)
Subjective:    Patient ID: [name], female    DOB: 1923-11-23, 85 y.o.   MRN: [id number]  HPI:03/05/2018 OV: [name] presents for f/u:HTN, HLD, Tremor, Chronic cervical neck pain She reports medication compliance, denies SE She drinks 10-20 oz water/day, 1-2 Dr. Pepper's/day She consistently follows heart healthy diet and remains active with the house/yard work. She has a strong support system of local family, her son checks in on her daily. She denies recent falls, reports being able to perform ADLs and IADLs Continue's to struggle with chronic cervical neck pain- using  OTC Acetaminophen and OTC [place] Daughter at [facility] during [place]  10/13/2018 OV:  [name] is here for 1 week f/u- UTI, low BP, bradycardia, change in mental status. Toprol XL 25mg  dereased to 12.5,g QD for bradycardia She denies CP/chest tightness/dizziness/palpitations  10/08/2018 Labs- reviewed at length with pt and her daughter Decreased Cipro from 500mg  BID to 250mg  BID due to decrease in GFR CMP repeated today Avoid NSAIDs  She states "I feel better" She denies CP/chest tightness with exertion She denies urinary sx's She denies flank pain She denies hematuria/hematochezia Per pt and family (daughter at [facility])- she is eating and drinking normally Per daughter her behavior is back to baseline   12/16/2018 OV: [name] is here for regular f/u: HTN, HLD, Moderate Dementia She has not been checking her BP/HR at home. She denies CP/Chest tightness with exertion. She denies HA/dizziness/palpitations. She continues to sip on water throughout the day and enjoys a daily Diet Doctor Pepper. She does not use the stove to cook- microwave meals and food that her family prepares and brings. Her daughter "Malachy Mood" is at [facility]- expresses concerns about worsening dementia. She again declined Neurology referral. Pt denies safety concerns at home- will use walker and cane throughout day when needed. She denies recent  falls. She uses a "shower chair". She uses "adult briefs" at night- discussed the importance of using a new/fresh one every night. She has her hearing aids checked Q6M- however she struggles to still struggles to hear   Patient Care Team    Relationship Specialty Notifications Start End  Mina Marble D, NP PCP - General Family Medicine  03/13/16   Magnus Sinning, MD Consulting Physician Physical Medicine and Rehabilitation  03/13/16   Bo Merino, MD Consulting Physician Rheumatology  03/13/16   Irene Shipper, MD Consulting Physician Gastroenterology  03/13/16   Webb Laws, [place] Referring Physician Optometry  03/13/16     Patient Active Problem List   Diagnosis Date Noted  . UTI (urinary tract infection) 10/08/2018  . Encounter for Medicare annual wellness exam 06/17/2018  . Abdominal pain, acute, left lower quadrant 09/01/2017  . Other chest pain 09/01/2017  . CKD (chronic kidney disease), symptom management only, stage 3 (moderate) 07/03/2017  . Cognitive changes 03/05/2017  . Healthcare maintenance 03/05/2017  . Hypertension 03/05/2017  . Cervicalgia 10/10/2016  . Lower extremity edema 10/03/2016  . Dyspnea on exertion 10/03/2016  . Right arm pain 09/04/2016  . Migraine 09/04/2016  . Numerous moles 09/04/2016  . Frequent headaches 09/04/2016  . Chronic insomnia 05/11/2015  . Vitamin D deficiency 05/17/2014  . Counseling regarding end of life decision making 05/03/2014  . Moderate dementia ([place]) 05/03/2014  . Lumbago 05/11/2012  . Vitamin B 12 deficiency 05/11/2012  . Dysphagia, pharyngoesophageal phase 12/17/2010  . Esophageal reflux 12/17/2010  . Abdominal hernia 12/17/2010  . Anemia, iron deficiency 12/06/2010  . HYPERCHOLESTEROLEMIA, MILD 11/19/2008  .  Venous (peripheral) insufficiency 11/14/2007  . Essential hypertension, benign 05/14/2007  . Arrhythmia 05/14/2007  . CONSTIPATION, CHRONIC 05/14/2007  . Osteoporosis 05/14/2007  . Benign essential tremor  05/14/2007  . HIATAL HERNIA 12/18/2006  . DEGENERATIVE JOINT DISEASE, GENERALIZED 12/18/2006     Past Medical History:  Diagnosis Date  . Abnormal involuntary movements(781.0)   . Anemia   . Anxiety state, unspecified   . Blood transfusion   . Cardiac dysrhythmia, unspecified   . Chronic constipation   . DJD (degenerative joint disease)   . Esophageal stricture   . Generalized osteoarthrosis, unspecified site   . GERD (gastroesophageal reflux disease)   . Hiatal hernia   . Hypertension   . Osteoporosis   . Pelvic adhesions   . Postgastrectomy syndrome   . Pure hypercholesterolemia   . Tortuous colon   . Urinary tract infection, site not specified   . Venous insufficiency   . Vitamin B12 deficiency      Past Surgical History:  Procedure Laterality Date  . CHOLECYSTECTOMY    . COLONOSCOPY    . ESOPHAGUS SURGERY  2003  . mole biopsy- left neck area Left   . NISSEN FUNDOPLICATION    . TOTAL ABDOMINAL HYSTERECTOMY     DUB,   . UPPER GASTROINTESTINAL ENDOSCOPY       Family History  Problem Relation Age of Onset  . Cancer Mother        unknown type, most likely colon  . Heart disease Brother   . Arthritis Father   . Heart disease Sister      Social History   Substance and Sexual Activity  Drug Use No     Social History   Substance and Sexual Activity  Alcohol Use No  . Alcohol/week: 0.0 standard drinks     Social History   Tobacco Use  Smoking Status Never Smoker  Smokeless Tobacco Never Used     Outpatient Encounter Medications as of 12/16/2018  Medication Sig  . acetaminophen (TYLENOL) 500 MG tablet Take 1,000 mg by mouth as needed.   . AMBULATORY NON FORMULARY MEDICATION Medication Name: GI Cocktail with 20cc Maalox, 20cc Viscous Lidocaine 2%, take 30cc dose q8hr as needed for GI upset.  Marland Kitchen diltiazem (CARDIZEM CD) 120 MG 24 hr capsule Take 1 capsule (120 mg total) by mouth daily.  Marland Kitchen MELATONIN PO Take by mouth at bedtime.  . metoprolol  succinate (TOPROL-XL) 25 MG 24 hr tablet 1/2 tablet daily with or immediately following a meal.  . omeprazole (PRILOSEC) 20 MG capsule TAKE 1 CAPSULE BY MOUTH DAILY.  Marland Kitchen polyethylene glycol (MIRALAX / GLYCOLAX) packet Take 17 g by mouth daily as needed.   . pravastatin (PRAVACHOL) 40 MG tablet Take 1 tablet (40 mg total) by mouth every evening.  . traMADol (ULTRAM) 50 MG tablet Take 1 tablet (50 mg total) by mouth every 8 (eight) hours as needed.  . valsartan-hydrochlorothiazide (DIOVAN-HCT) 80-12.5 MG tablet Take 1 tablet by mouth daily.  . [DISCONTINUED] ciprofloxacin (CIPRO) 500 MG tablet Take 1 tablet (500 mg total) by mouth 2 (two) times daily. (Patient taking differently: Take 250 mg by mouth 2 (two) times daily. )  . [DISCONTINUED] diltiazem (DILACOR XR) 180 MG 24 hr capsule Take 1 capsule (180 mg total) by mouth daily.   No facility-administered encounter medications on file as of 12/16/2018.     Allergies: Patient has no known allergies.  Body mass index is 26.65 kg/m.  Blood pressure (!) 143/71, pulse 60, temperature 97.8 F (  36.6 C), temperature source Oral, weight 141 lb 0.8 oz (64 kg), SpO2 97 %.  Review of Systems  Constitutional: Positive for fatigue. Negative for activity change, appetite change, chills, diaphoresis, fever and unexpected weight change.  HENT: Negative for congestion and hearing loss.   Eyes: Positive for visual disturbance.  Respiratory: Negative for cough, chest tightness, shortness of breath and wheezing.   Cardiovascular: Negative for chest pain, palpitations and leg swelling.  Gastrointestinal: Negative for abdominal distention, anal bleeding, blood in stool, constipation, diarrhea, nausea and vomiting.  Endocrine: Negative for polydipsia, polyphagia and polyuria.  Genitourinary: Negative for difficulty urinating and flank pain.  Skin: Negative for color change, pallor, rash and wound.  Neurological: Positive for tremors. Negative for dizziness and  headaches.  Hematological: Negative for adenopathy. Does not bruise/bleed easily.  Psychiatric/Behavioral: Positive for confusion. Negative for agitation, behavioral problems, decreased concentration, dysphoric mood, hallucinations, self-injury, sleep disturbance and suicidal ideas. The patient is not nervous/anxious and is not hyperactive.        Objective:   Physical Exam Vitals signs and nursing note reviewed.  Constitutional:      General: She is not in acute distress.    Appearance: Normal appearance. She is normal weight. She is not ill-appearing, toxic-appearing or diaphoretic.  Cardiovascular:     Rate and Rhythm: Normal rate and regular rhythm.     Pulses: Normal pulses.     Heart sounds: Normal heart sounds. No murmur. No friction rub. No gallop.   Musculoskeletal:     Right lower leg: No edema.     Left lower leg: No edema.  Skin:    General: Skin is warm and dry.  Neurological:     Mental Status: She is alert.        Assessment & Plan:   1. Need for immunization against influenza   2. Healthcare maintenance   3. Hypertension, unspecified type   4. Moderate dementia without behavioral disturbance ([place])   5. Benign essential tremor   6. HYPERCHOLESTEROLEMIA, MILD     Healthcare maintenance Your blood pressure and weight are stable. Your last lab work in August was stable. Please continue all medications as directed. Please check your blood pressure and heart rate daily- if consistently <100/60 or >140/90-please call clinic. Please increase daily water intake, follow DASH diet. Please continue to social and a wear a mask when in public. Follow-up in 4 months.  Hypertension BP 143/71, HR 60 Diltiazem 120mg  QD, Metoprolol XL 25mg  1/2 tab BID, Valsartan-Hydrochlorothiazide 80-12.5mg  QD Please check your blood pressure and heart rate daily- if consistently <100/60 or >140/90-please call clinic.   Moderate dementia ([place]) Declined referral to  Neurology Discussed MRI if dementia worsens   Benign essential tremor Stable  HYPERCHOLESTEROLEMIA, MILD Pravastatin 40mg  QD    FOLLOW-UP:  Return in about 4 months (around 04/18/2019) for Regular Follow Up, HTN, Hypercholestermia.

## 2018-12-16 ENCOUNTER — Ambulatory Visit (INDEPENDENT_AMBULATORY_CARE_PROVIDER_SITE_OTHER): Payer: Medicare Other | Admitting: Adult Health

## 2018-12-16 ENCOUNTER — Other Ambulatory Visit: Payer: Self-pay

## 2018-12-16 ENCOUNTER — Encounter: Payer: Self-pay | Admitting: Adult Health

## 2018-12-16 DIAGNOSIS — Z23 Encounter for immunization: Secondary | ICD-10-CM

## 2018-12-16 DIAGNOSIS — I10 Essential (primary) hypertension: Secondary | ICD-10-CM

## 2018-12-16 DIAGNOSIS — G250 Essential tremor: Secondary | ICD-10-CM | POA: Diagnosis not present

## 2018-12-16 DIAGNOSIS — E7800 Pure hypercholesterolemia, unspecified: Secondary | ICD-10-CM

## 2018-12-16 DIAGNOSIS — Z0000 Encounter for general adult medical examination without abnormal findings: Secondary | ICD-10-CM

## 2018-12-16 DIAGNOSIS — F03B0 Unspecified dementia, moderate, without behavioral disturbance, psychotic disturbance, mood disturbance, and anxiety: Secondary | ICD-10-CM

## 2018-12-16 DIAGNOSIS — F0390 Unspecified dementia without behavioral disturbance: Secondary | ICD-10-CM

## 2018-12-16 NOTE — Assessment & Plan Note (Signed)
Your blood pressure and weight are stable. Your last lab work in August was stable. Please continue all medications as directed. Please check your blood pressure and heart rate daily- if consistently <100/60 or >140/90-please call clinic. Please increase daily water intake, follow DASH diet. Please continue to social and a wear a mask when in public. Follow-up in 4 months.

## 2018-12-16 NOTE — Assessment & Plan Note (Signed)
Stable

## 2018-12-16 NOTE — Assessment & Plan Note (Signed)
Pravastatin 40mg  QD

## 2018-12-16 NOTE — Assessment & Plan Note (Addendum)
Declined referral to Neurology Discussed MRI if dementia worsens

## 2018-12-16 NOTE — Patient Instructions (Addendum)
DASH Eating Plan DASH stands for "Dietary Approaches to Stop Hypertension." The DASH eating plan is a healthy eating plan that has been shown to reduce high blood pressure (hypertension). It may also reduce your risk for type 2 diabetes, heart disease, and stroke. The DASH eating plan may also help with weight loss. What are tips for following this plan?  General guidelines  Avoid eating more than 2,300 mg (milligrams) of salt (sodium) a day. If you have hypertension, you may need to reduce your sodium intake to 1,500 mg a day.  Limit alcohol intake to no more than 1 drink a day for nonpregnant women and 2 drinks a day for men. One drink equals 12 oz of beer, 5 oz of wine, or 1 oz of hard liquor.  Work with your health care provider to maintain a healthy body weight or to lose weight. Ask what an ideal weight is for you.  Get at least 30 minutes of exercise that causes your heart to beat faster (aerobic exercise) most days of the week. Activities may include walking, swimming, or biking.  Work with your health care provider or diet and nutrition specialist (dietitian) to adjust your eating plan to your individual calorie needs. Reading food labels   Check food labels for the amount of sodium per serving. Choose foods with less than 5 percent of the Daily Value of sodium. Generally, foods with less than 300 mg of sodium per serving fit into this eating plan.  To find whole grains, look for the word "whole" as the first word in the ingredient list. Shopping  Buy products labeled as "low-sodium" or "no salt added."  Buy fresh foods. Avoid canned foods and premade or frozen meals. Cooking  Avoid adding salt when cooking. Use salt-free seasonings or herbs instead of table salt or sea salt. Check with your health care provider or pharmacist before using salt substitutes.  Do not fry foods. Cook foods using healthy methods such as baking, boiling, grilling, and broiling instead.  Cook with  heart-healthy oils, such as olive, canola, soybean, or sunflower oil. Meal planning  Eat a balanced diet that includes: ? 5 or more servings of fruits and vegetables each day. At each meal, try to fill half of your plate with fruits and vegetables. ? Up to 6-8 servings of whole grains each day. ? Less than 6 oz of lean meat, poultry, or fish each day. A 3-oz serving of meat is about the same size as a deck of cards. One egg equals 1 oz. ? 2 servings of low-fat dairy each day. ? A serving of nuts, seeds, or beans 5 times each week. ? Heart-healthy fats. Healthy fats called Omega-3 fatty acids are found in foods such as flaxseeds and coldwater fish, like sardines, salmon, and mackerel.  Limit how much you eat of the following: ? Canned or prepackaged foods. ? Food that is high in trans fat, such as fried foods. ? Food that is high in saturated fat, such as fatty meat. ? Sweets, desserts, sugary drinks, and other foods with added sugar. ? Full-fat dairy products.  Do not salt foods before eating.  Try to eat at least 2 vegetarian meals each week.  Eat more home-cooked food and less restaurant, buffet, and fast food.  When eating at a restaurant, ask that your food be prepared with less salt or no salt, if possible. What foods are recommended? The items listed may not be a complete list. Talk with your dietitian about   what dietary choices are best for you. Grains Whole-grain or whole-wheat bread. Whole-grain or whole-wheat pasta. Brown rice. Oatmeal. Quinoa. Bulgur. Whole-grain and low-sodium cereals. Pita bread. Low-fat, low-sodium crackers. Whole-wheat flour tortillas. Vegetables Fresh or frozen vegetables (raw, steamed, roasted, or grilled). Low-sodium or reduced-sodium tomato and vegetable juice. Low-sodium or reduced-sodium tomato sauce and tomato paste. Low-sodium or reduced-sodium canned vegetables. Fruits All fresh, dried, or frozen fruit. Canned fruit in natural juice (without  added sugar). Meat and other protein foods Skinless chicken or turkey. Ground chicken or turkey. Pork with fat trimmed off. Fish and seafood. Egg whites. Dried beans, peas, or lentils. Unsalted nuts, nut butters, and seeds. Unsalted canned beans. Lean cuts of beef with fat trimmed off. Low-sodium, lean deli meat. Dairy Low-fat (1%) or fat-free (skim) milk. Fat-free, low-fat, or reduced-fat cheeses. Nonfat, low-sodium ricotta or cottage cheese. Low-fat or nonfat yogurt. Low-fat, low-sodium cheese. Fats and oils Soft margarine without trans fats. Vegetable oil. Low-fat, reduced-fat, or light mayonnaise and salad dressings (reduced-sodium). Canola, safflower, olive, soybean, and sunflower oils. Avocado. Seasoning and other foods Herbs. Spices. Seasoning mixes without salt. Unsalted popcorn and pretzels. Fat-free sweets. What foods are not recommended? The items listed may not be a complete list. Talk with your dietitian about what dietary choices are best for you. Grains Baked goods made with fat, such as croissants, muffins, or some breads. Dry pasta or rice meal packs. Vegetables Creamed or fried vegetables. Vegetables in a cheese sauce. Regular canned vegetables (not low-sodium or reduced-sodium). Regular canned tomato sauce and paste (not low-sodium or reduced-sodium). Regular tomato and vegetable juice (not low-sodium or reduced-sodium). Pickles. Olives. Fruits Canned fruit in a light or heavy syrup. Fried fruit. Fruit in cream or butter sauce. Meat and other protein foods Fatty cuts of meat. Ribs. Fried meat. Bacon. Sausage. Bologna and other processed lunch meats. Salami. Fatback. Hotdogs. Bratwurst. Salted nuts and seeds. Canned beans with added salt. Canned or smoked fish. Whole eggs or egg yolks. Chicken or turkey with skin. Dairy Whole or 2% milk, cream, and half-and-half. Whole or full-fat cream cheese. Whole-fat or sweetened yogurt. Full-fat cheese. Nondairy creamers. Whipped toppings.  Processed cheese and cheese spreads. Fats and oils Butter. Stick margarine. Lard. Shortening. Ghee. Bacon fat. Tropical oils, such as coconut, palm kernel, or palm oil. Seasoning and other foods Salted popcorn and pretzels. Onion salt, garlic salt, seasoned salt, table salt, and sea salt. Worcestershire sauce. Tartar sauce. Barbecue sauce. Teriyaki sauce. Soy sauce, including reduced-sodium. Steak sauce. Canned and packaged gravies. Fish sauce. Oyster sauce. Cocktail sauce. Horseradish that you find on the shelf. Ketchup. Mustard. Meat flavorings and tenderizers. Bouillon cubes. Hot sauce and Tabasco sauce. Premade or packaged marinades. Premade or packaged taco seasonings. Relishes. Regular salad dressings. Where to find more information:  National Heart, Lung, and Blood Institute: www.nhlbi.nih.gov  American Heart Association: www.heart.org Summary  The DASH eating plan is a healthy eating plan that has been shown to reduce high blood pressure (hypertension). It may also reduce your risk for type 2 diabetes, heart disease, and stroke.  With the DASH eating plan, you should limit salt (sodium) intake to 2,300 mg a day. If you have hypertension, you may need to reduce your sodium intake to 1,500 mg a day.  When on the DASH eating plan, aim to eat more fresh fruits and vegetables, whole grains, lean proteins, low-fat dairy, and heart-healthy fats.  Work with your health care provider or diet and nutrition specialist (dietitian) to adjust your eating plan to your   individual calorie needs. This information is not intended to replace advice given to you by your health care provider. Make sure you discuss any questions you have with your health care provider. Document Released: 01/24/2011 Document Revised: 01/17/2017 Document Reviewed: 01/29/2016 Elsevier Patient Education  2020 [place] blood pressure and weight are stable. Your last lab work in August was stable. Please continue all  medications as directed. Please check your blood pressure and heart rate daily- if consistently <100/60 or >140/90-please call clinic. Please increase daily water intake, follow DASH diet. Please continue to social and a wear a mask when in public. Follow-up in 4 months. GREAT TO SEE YOU!

## 2018-12-16 NOTE — Assessment & Plan Note (Addendum)
BP 143/71, HR 60 Diltiazem 120mg  QD, Metoprolol XL 25mg  1/2 tab BID, Valsartan-Hydrochlorothiazide 80-12.5mg  QD Please check your blood pressure and heart rate daily- if consistently <100/60 or >140/90-please call clinic.

## 2018-12-22 ENCOUNTER — Other Ambulatory Visit: Payer: Self-pay | Admitting: Adult Health

## 2019-06-02 ENCOUNTER — Other Ambulatory Visit: Payer: Self-pay | Admitting: Adult Health

## 2019-06-21 ENCOUNTER — Telehealth: Payer: Self-pay | Admitting: Physician Assistant

## 2019-06-21 NOTE — Telephone Encounter (Signed)
If pt's BP readings range in 120s/70s then continue current medication regimen daughter is following- Diltizaem, Valsartan-HCTZ. If she hasn't been taking Metroprolol and BP continues to stay at goal, then we can discuss stopping metoprolol at next OV, because I don't want pt's BP to get too low. Continue BP and HR monitoring and keep a log to bring at her OV later this month.   Thank you, Lorrene Reid, PA-C

## 2019-06-21 NOTE — Telephone Encounter (Signed)
  Esaw Grandchild, NP Family Medicine Medication Refill Reason for call  Conversation: Medication Refill (Newest Message First)    SI   12/22/18 5:31 AM Interface, Surescripts Out routed this conversation to [facility] Refill  Additional Documentation  Encounter Info:   Theme park manager, History, Allergies, Detailed Report   Orders Placed  None Medication Renewals and Changes   Valsartan-hydroCHLOROthiazide 1 tablet Oral Daily  Pravastatin Sodium 40 MG TAKE 1 TABLET BY MOUTH  EVERY EVENING   Refused  dilTIAZem HCl Coated Beads 180 MG TAKE 1 CAPSULE BY MOUTH  EVERY DAY  Metoprolol Succinate TAKE 1 TABLET BY MOUTH  DAILY WITH OR IMMEDIATLEY  FOLLOWING A MEAL

## 2019-06-21 NOTE — Telephone Encounter (Signed)
Patient last seen 11/2018 by Valetta Fuller and advised then : BP 143/71, HR 60 Diltiazem 120mg  QD, Metoprolol XL 25mg  1/2 tab BID, Valsartan-Hydrochlorothiazide 80-12.5mg  QD Please check your blood pressure and heart rate daily- if consistently <100/60 or >140/90-please call clinic.    [name] then refused refills for Diltiazem and Metoprolol 12/2018.   Patients daughter has been giving all 3 meds with the exception of giving Metoprolol 1/2 tab daily (per instructions on bottle) Patients BP readings have been 120s/70s. Patient not having any dizziness/light headedness that she has expressed to daughter.  Patient daughter asking for clarification as to what meds she is supposed to be giving mom and how. Please review the information and advise. AS, CMA

## 2019-06-21 NOTE — Telephone Encounter (Signed)
Patient's daughter [name]( DPR for all [facility] signed) called  1st requested refill on BP med :  diltiazem (CARDIZEM CD) 120 MG 24 hr capsule [id number]   But says Valetta Fuller also has pt on :   valsartan-hydrochlorothiazide (DIOVAN-HCT) 80-12.5 MG tablet [id number]   Order Details Dose: 1 tablet Route: Oral Frequency: Daily  Dispense Quantity: 90 tablet Refills: 1   Note to Pharmacy: Requesting 1 year supply      Sig: TAKE 1 TABLET BY MOUTH DAILY       Pt's daughter wonders if Mom/Patient really needs both of these  BP meds (wishes to clarify that 1st before asking for Rx refills)  [name]/ Pt's Daughter can be reached @ [phone]    Also states pt uses local Pharmacy :  [place], [place] - [phone] (Phone) [phone] (Fax   --glh

## 2019-06-22 NOTE — Telephone Encounter (Signed)
Patients daughter has been giving all 3 meds with the exception of giving Metoprolol 1/2 tab daily (per instructions on bottle)   Do you want her to only take the Diltiazem 120mg  QD, Valsartan-HCTZ 80-12.5mg  QD and d/c the Metoprolol XL 25mg  1/2 tab QD? Or continue the Metoprolol?

## 2019-06-23 MED ORDER — DILTIAZEM HCL ER COATED BEADS 120 MG PO CP24: 120.0000 mg | ORAL_CAPSULE | Freq: Every day | ORAL | 0 refills | Status: DC

## 2019-06-23 NOTE — Telephone Encounter (Signed)
Patient daughter is aware of the below and verbalized understanding. She request refill for Diltiazem to be sent to [facility]. Medication sent in. Patient has apt scheduled 07/15/19 and will be coming in office. AS, CMA

## 2019-06-23 NOTE — Telephone Encounter (Signed)
Continue current regimen Daughter has been doing and we can discuss at her upcoming visit. Thank you.

## 2019-07-07 NOTE — Progress Notes (Signed)
Established Patient Office Visit  Subjective:  Patient ID: [name], female    DOB: [birth year]  Age: 86 y.o. MRN: [id number]  CC:  Chief Complaint  Patient presents with  . Hyperlipidemia  . Hypertension    HPI [name] presents for 4 month chronic follow-up on hypertension, hypercholesteremia, CKD, moderate dementia, and medication refills. Pt is accompanied by her daughter.   HTN: Pt's daughter reports patient had lower extremity swelling which has improved. Taking medication as directed without side effects. Brings in BP log and readings range in 108-137/56-78 with HR 49-74. Pt's daughter inquired about possibly decreasing blood pressure medications and stopping one medication.   HLD: Pt taking medication as directed without issues. Denies side effects including myalgias and RUQ pain.   Dementia: Pt's daughter reports memory is worsening.  Daughter states pt is repeating the same conversations more frequently now. Pt forgets she just had the same conversation.   R hand rash: Pt's daughter has been applying benadryl cream to hand for a rash. Pt hasn't complained of pain or itchiness but she tends to scratch the area.      Past Medical History:  Diagnosis Date  . Abnormal involuntary movements(781.0)   . Anemia   . Anxiety state, unspecified   . Blood transfusion   . Cardiac dysrhythmia, unspecified   . Chronic constipation   . DJD (degenerative joint disease)   . Esophageal stricture   . Generalized osteoarthrosis, unspecified site   . GERD (gastroesophageal reflux disease)   . Hiatal hernia   . Hypertension   . Osteoporosis   . Pelvic adhesions   . Postgastrectomy syndrome   . Pure hypercholesterolemia   . Tortuous colon   . Urinary tract infection, site not specified   . Venous insufficiency   . Vitamin B12 deficiency     Past Surgical History:  Procedure Laterality Date  . CHOLECYSTECTOMY    . COLONOSCOPY    . ESOPHAGUS SURGERY  2003  . mole  biopsy- left neck area Left   . NISSEN FUNDOPLICATION    . TOTAL ABDOMINAL HYSTERECTOMY     DUB,   . UPPER GASTROINTESTINAL ENDOSCOPY      Family History  Problem Relation Age of Onset  . Cancer Mother        unknown type, most likely colon  . Heart disease Brother   . Arthritis Father   . Heart disease Sister     Social History   Socioeconomic History  . Marital status: Widowed    Spouse name: Not on file  . Number of children: 3  . Years of education: Not on file  . Highest education level: Not on file  Occupational History  . Occupation: Retired  Tobacco Use  . Smoking status: Never Smoker  . Smokeless tobacco: Never Used  Substance and Sexual Activity  . Alcohol use: No    Alcohol/week: 0.0 standard drinks  . Drug use: No  . Sexual activity: Never    Birth control/protection: Surgical  Other Topics Concern  . Not on file  Social History Narrative   Widowed,  4 children, lives close by.    Daughters [place], [place]) come to [place] with her.      Social Determinants of Health   Financial Resource Strain:   . Difficulty of Paying Living Expenses:   Food Insecurity:   . Worried About Charity fundraiser in the Last Year:   . [place] in the Last  Year:   Transportation Needs:   . Film/video editor (Medical):   Marland Kitchen Lack of Transportation (Non-Medical):   Physical Activity:   . Days of Exercise per Week:   . Minutes of Exercise per Session:   Stress:   . Feeling of Stress :   Social Connections:   . Frequency of Communication with Friends and Family:   . Frequency of Social Gatherings with Friends and Family:   . Attends Religious Services:   . Active Member of Clubs or Organizations:   . Attends Archivist Meetings:   Marland Kitchen Marital Status:   Intimate Partner Violence:   . Fear of Current or Ex-Partner:   . Emotionally Abused:   Marland Kitchen Physically Abused:   . Sexually Abused:     Outpatient Medications Prior to Visit  Medication Sig  Dispense Refill  . acetaminophen (TYLENOL) 500 MG tablet Take 1,000 mg by mouth as needed.     . AMBULATORY NON FORMULARY MEDICATION Medication Name: GI Cocktail with 20cc Maalox, 20cc Viscous Lidocaine 2%, take 30cc dose q8hr as needed for GI upset. 1200 mL 6  . diltiazem (CARDIZEM CD) 120 MG 24 hr capsule Take 1 capsule (120 mg total) by mouth daily. 90 capsule 0  . MELATONIN PO Take by mouth at bedtime.    . Multiple Vitamins-Minerals (OCUVITE PO) Take 1 tablet by mouth daily.    Marland Kitchen omeprazole (PRILOSEC) 20 MG capsule TAKE 1 CAPSULE BY MOUTH DAILY. 30 capsule 11  . polyethylene glycol (MIRALAX / GLYCOLAX) packet Take 17 g by mouth daily as needed.     . pravastatin (PRAVACHOL) 40 MG tablet TAKE 1 TABLET BY MOUTH  EVERY EVENING 90 tablet 1  . traMADol (ULTRAM) 50 MG tablet Take 1 tablet (50 mg total) by mouth every 8 (eight) hours as needed. 15 tablet 0  . metoprolol succinate (TOPROL-XL) 25 MG 24 hr tablet 1/2 tablet daily with or immediately following a meal. 90 tablet 2  . valsartan-hydrochlorothiazide (DIOVAN-HCT) 80-12.5 MG tablet TAKE 1 TABLET BY MOUTH  DAILY 90 tablet 1   No facility-administered medications prior to visit.    No Known Allergies  ROS Review of Systems  A fourteen system review of systems was performed and found to be positive as per HPI.   Objective:    Physical Exam  General:  Well Developed, well nourished, appropriate for stated age.  Neuro:  Alert,  extra-ocular muscles intact  HEENT:  Normocephalic, atraumatic, neck supple  Skin: Warm, pink. Macular skin lesions noted on R hand. Corn on left toe noted. Cardiac:  RRR, S1 S2 Respiratory:  ECTA B/L and A/P, Not using accessory muscles, speaking in full sentences- unlabored. Vascular:  Ext warm, no cyanosis apprec.; cap RF less 2 sec. Trace of non-pitting edema present B/L. Psych:  No HI/SI, judgement and insight limited, Euthymic mood. Full Affect.   BP 137/71   Pulse 61   Temp 97.6 F (36.4 C) (Oral)    Ht 5' 1"  (1.549 m)   Wt 139 lb 14.4 oz (63.5 kg)   SpO2 96% Comment: on RA  BMI 26.43 kg/m  Wt Readings from Last 3 Encounters:  07/15/19 139 lb 14.4 oz (63.5 kg)  12/16/18 141 lb 0.8 oz (64 kg)  10/13/18 143 lb (64.9 kg)     Health Maintenance Due  Topic Date Due  . COVID-19 Vaccine (1) Never done    There are no preventive care reminders to display for this patient.  Lab Results  Component  Value Date   TSH 2.450 10/08/2018   Lab Results  Component Value Date   WBC 5.7 07/15/2019   HGB 12.0 07/15/2019   HCT 35.8 07/15/2019   MCV 92 07/15/2019   PLT 243 07/15/2019   Lab Results  Component Value Date   NA 141 07/15/2019   K 3.9 07/15/2019   CO2 22 07/15/2019   GLUCOSE 86 07/15/2019   BUN 13 07/15/2019   CREATININE 0.97 07/15/2019   BILITOT 0.9 07/15/2019   ALKPHOS 89 07/15/2019   AST 18 07/15/2019   ALT 10 07/15/2019   PROT 6.5 07/15/2019   ALBUMIN 4.2 07/15/2019   CALCIUM 9.4 07/15/2019   ANIONGAP 5 09/01/2017   GFR 70.69 05/11/2015   Lab Results  Component Value Date   CHOL 115 07/15/2019   Lab Results  Component Value Date   HDL 42 07/15/2019   Lab Results  Component Value Date   LDLCALC 55 07/15/2019   Lab Results  Component Value Date   TRIG 91 07/15/2019   Lab Results  Component Value Date   CHOLHDL 2.7 07/15/2019   Lab Results  Component Value Date   HGBA1C 5.3 10/08/2018      Assessment & Plan:   Problem List Items Addressed This Visit      Cardiovascular and Mediastinum   Essential hypertension, benign - Primary   Relevant Medications   metoprolol succinate (TOPROL-XL) 25 MG 24 hr tablet   Other Relevant Orders   Comp Met (CMET) (Completed)   CBC w/Diff (Completed)     Nervous and Auditory   Moderate dementia (HCC) (Chronic)   Relevant Orders   CBC w/Diff (Completed)   Ambulatory referral to Neurology   Benign essential tremor     Genitourinary   CKD (chronic kidney disease), symptom management only, stage 3  (moderate)   Relevant Orders   Comp Met (CMET) (Completed)     Other   HYPERCHOLESTEROLEMIA, MILD   Relevant Medications   metoprolol succinate (TOPROL-XL) 25 MG 24 hr tablet   Other Relevant Orders   Lipid Profile (Completed)     HTN: - BP today is 146/71, HR 61 and repeat BP 137/71. - Patient's BP and pulse log shows BP is staying at goal and it is reasonable to discontinue one BP medication given pt's age and co-morbidities, especially with few diastolic readings in 67R.  - Discontinued Valsartan-Hydrochlorothiazide. - Continue Diltiazem 120 mg once daily and Metoprolol 25 mg 0.5 tablet  - Continue ambulatory BP and pulse monitoring and keep a log. Discussed with patient's daughter if ambulatory BP consistently >140/90 to notify the office and will restart Valsartan-HCTZ.  - Encourage to maintain proper hydration, at least 64 fl oz - Follow DASH diet. - Will recheck CMP today.  Hypercholesterolemia, mild: - Last lipid panel wnl's. - Continue Pravastatin 40 mg. - Follow heart healthy diet and stay as active as possible.  - Will recheck lipid panel and hepatic function today.  CKD stage III: - Last CMP: creatinine 1.02 and GFR 48 - Continue to avoid nephrotoxic substances. - Encourage proper hydration. - Will recheck kidney function today.  Moderate Dementia: - Discussed referral to Neurology given worsening memory and pt's daughter and pt agreeable.  - During office visit personally experienced patient having the same conversation about the construction of a pool behind her house twice. - Will place neurology referral.   Rash: - Continue to apply benadryl cream as needed. - If rash worsens or fails to improve RTC.  LE edema: - Continue elevation, use compression socks. - Will continue to monitor.  Meds ordered this encounter  Medications  . metoprolol succinate (TOPROL-XL) 25 MG 24 hr tablet    Sig: 1/2 tablet daily with or immediately following a meal.     Dispense:  90 tablet    Refill:  2    Order Specific Question:   Supervising Provider    Answer:   Beatrice Lecher D [[id number]]    Follow-up: Return in about 3 months (around 10/15/2019) for [facility]  .    Lorrene Reid, PA-C

## 2019-07-15 ENCOUNTER — Ambulatory Visit (INDEPENDENT_AMBULATORY_CARE_PROVIDER_SITE_OTHER): Payer: Medicare Other | Admitting: Physician Assistant

## 2019-07-15 ENCOUNTER — Other Ambulatory Visit: Payer: Self-pay

## 2019-07-15 ENCOUNTER — Encounter: Payer: Self-pay | Admitting: Physician Assistant

## 2019-07-15 VITALS — BP 137/71 | HR 61 | Temp 97.6°F | Ht 61.0 in | Wt 139.9 lb

## 2019-07-15 DIAGNOSIS — E7800 Pure hypercholesterolemia, unspecified: Secondary | ICD-10-CM | POA: Diagnosis not present

## 2019-07-15 DIAGNOSIS — G250 Essential tremor: Secondary | ICD-10-CM | POA: Diagnosis not present

## 2019-07-15 DIAGNOSIS — R600 Localized edema: Secondary | ICD-10-CM

## 2019-07-15 DIAGNOSIS — I10 Essential (primary) hypertension: Secondary | ICD-10-CM

## 2019-07-15 DIAGNOSIS — F0390 Unspecified dementia without behavioral disturbance: Secondary | ICD-10-CM

## 2019-07-15 DIAGNOSIS — N183 Chronic kidney disease, stage 3 unspecified: Secondary | ICD-10-CM | POA: Diagnosis not present

## 2019-07-15 DIAGNOSIS — R21 Rash and other nonspecific skin eruption: Secondary | ICD-10-CM

## 2019-07-15 DIAGNOSIS — F03B0 Unspecified dementia, moderate, without behavioral disturbance, psychotic disturbance, mood disturbance, and anxiety: Secondary | ICD-10-CM

## 2019-07-15 MED ORDER — METOPROLOL SUCCINATE ER 25 MG PO TB24: ORAL_TABLET | ORAL | 2 refills | Status: DC

## 2019-07-15 NOTE — Patient Instructions (Addendum)
Corns and Calluses Corns are small areas of thickened skin that occur on the top, sides, or tip of a toe. They contain a cone-shaped core with a point that can press on a nerve below. This causes pain.  Calluses are areas of thickened skin that can occur anywhere on the body, including the hands, fingers, palms, soles of the feet, and heels. Calluses are usually larger than corns. What are the causes? Corns and calluses are caused by rubbing (friction) or pressure, such as from shoes that are too tight or do not fit properly. What increases the risk? Corns are more likely to develop in people who have misshapen toes (toe deformities), such as hammer toes. Calluses can occur with friction to any area of the skin. They are more likely to develop in people who:  Work with their hands.  Wear shoes that fit poorly, are too tight, or are high-heeled.  Have toe deformities. What are the signs or symptoms? Symptoms of a corn or callus include:  A hard growth on the skin.  Pain or tenderness under the skin.  Redness and swelling.  Increased discomfort while wearing tight-fitting shoes, if your feet are affected. If a corn or callus becomes infected, symptoms may include:  Redness and swelling that gets worse.  Pain.  Fluid, blood, or pus draining from the corn or callus. How is this diagnosed? Corns and calluses may be diagnosed based on your symptoms, your medical history, and a physical exam. How is this treated? Treatment for corns and calluses may include:  Removing the cause of the friction or pressure. This may involve: ? Changing your shoes. ? Wearing shoe inserts (orthotics) or other protective layers in your shoes, such as a corn pad. ? Wearing gloves.  Applying medicine to the skin (topical medicine) to help soften skin in the hardened, thickened areas.  Removing layers of dead skin with a file to reduce the size of the corn or callus.  Removing the corn or callus with a  scalpel or laser.  Taking antibiotic medicines, if your corn or callus is infected.  Having surgery, if a toe deformity is the cause. Follow these instructions at home:   Take over-the-counter and prescription medicines only as told by your health care provider.  If you were prescribed an antibiotic, take it as told by your health care provider. Do not stop taking it even if your condition starts to improve.  Wear shoes that fit well. Avoid wearing high-heeled shoes and shoes that are too tight or too loose.  Wear any padding, protective layers, gloves, or orthotics as told by your health care provider.  Soak your hands or feet and then use a file or pumice stone to soften your corn or callus. Do this as told by your health care provider.  Check your corn or callus every day for symptoms of infection. Contact a health care provider if you:  Notice that your symptoms do not improve with treatment.  Have redness or swelling that gets worse.  Notice that your corn or callus becomes painful.  Have fluid, blood, or pus coming from your corn or callus.  Have new symptoms. Summary  Corns are small areas of thickened skin that occur on the top, sides, or tip of a toe.  Calluses are areas of thickened skin that can occur anywhere on the body, including the hands, fingers, palms, and soles of the feet. Calluses are usually larger than corns.  Corns and calluses are caused by   rubbing (friction) or pressure, such as from shoes that are too tight or do not fit properly.  Treatment may include wearing any padding, protective layers, gloves, or orthotics as told by your health care provider. This information is not intended to replace advice given to you by your health care provider. Make sure you discuss any questions you have with your health care provider. Document Revised: 05/27/2018 Document Reviewed: 12/18/2016 Elsevier Patient Education  [place].   Dementia Caregiver  Guide Dementia is a term used to describe a number of symptoms that affect memory and thinking. The most common symptoms include:  Memory loss.  Trouble with language and communication.  Trouble concentrating.  Poor judgment.  Problems with reasoning.  Child-like behavior and language.  Extreme anxiety.  Angry outbursts.  Wandering from home or public places. Dementia usually gets worse slowly over time. In the early stages, people with dementia can stay independent and safe with some help. In later stages, they need help with daily tasks such as dressing, grooming, and using the bathroom. How to help the person with dementia cope Dementia can be frightening and confusing. Here are some tips to help the person with dementia cope with changes caused by the disease. General tips  Keep the person on track with his or her routine.  Try to identify areas where the person may need help.  Be supportive, patient, calm, and encouraging.  Gently remind the person that adjusting to changes takes time.  Help with the tasks that the person has asked for help with.  Keep the person involved in daily tasks and decisions as much as possible.  Encourage conversation, but try not to get frustrated or harried if the person struggles to find words or does not seem to appreciate your help. Communication tips  When the person is talking or seems frustrated, make eye contact and hold the person's hand.  Ask specific questions that need yes or no answers.  Use simple words, short sentences, and a calm voice. Only give one direction at a time.  When offering choices, limit them to just 1 or 2.  Avoid correcting the person in a negative way.  If the person is struggling to find the right words, gently try to help him or her. How to recognize symptoms of stress Symptoms of stress in caregivers include:  Feeling frustrated or angry with the person with dementia.  Denying that the person  has dementia or that his or her symptoms will not improve.  Feeling hopeless and unappreciated.  Difficulty sleeping.  Difficulty concentrating.  Feeling anxious, irritable, or depressed.  Developing stress-related health problems.  Feeling like you have too little time for your own life. Follow these instructions at home:   Make sure that you and the person you are caring for: ? Get regular sleep. ? Exercise regularly. ? Eat regular, nutritious meals. ? Drink enough fluid to keep your urine clear or pale yellow. ? Take over-the-counter and prescription medicines only as told by your health care providers. ? Attend all scheduled health care appointments.  Join a support group with others who are caregivers.  Ask about respite care resources so that you can have a regular break from the stress of caregiving.  Look for signs of stress in yourself and in the person you are caring for. If you notice signs of stress, take steps to manage it.  Consider any safety risks and take steps to avoid them.  Organize medications in a pill  box for each day of the week.  Create a plan to handle any legal or financial matters. Get legal or financial advice if needed.  Keep a calendar in a central location to remind the person of appointments or other activities. Tips for reducing the risk of injury  Keep floors clear of clutter. Remove rugs, magazine racks, and floor lamps.  Keep hallways well lit, especially at night.  Put a handrail and nonslip mat in the bathtub or shower.  Put childproof locks on cabinets that contain dangerous items, such as medicines, alcohol, guns, toxic cleaning items, sharp tools or utensils, matches, and lighters.  Put the locks in places where the person cannot see or reach them easily. This will help ensure that the person does not wander out of the house and get lost.  Be prepared for emergencies. Keep a list of emergency phone numbers and addresses in a  convenient area.  Remove car keys and lock garage doors so that the person does not try to get in the car and drive.  Have the person wear a bracelet that tracks locations and identifies the person as having memory problems. This should be worn at all times for safety. Where to find support: Many individuals and organizations offer support. These include:  Support groups for people with dementia and for caregivers.  Counselors or therapists.  Home health care services.  Adult day care centers. Where to find more information Alzheimer's Association: [employer] Contact a health care provider if:  The person's health is rapidly getting worse.  You are no longer able to care for the person.  Caring for the person is affecting your physical and emotional health.  The person threatens himself or herself, you, or anyone else. Summary  Dementia is a term used to describe a number of symptoms that affect memory and thinking.  Dementia usually gets worse slowly over time.  Take steps to reduce the person's risk of injury, and to plan for future care.  Caregivers need support, relief from caregiving, and time for their own lives. This information is not intended to replace advice given to you by your health care provider. Make sure you discuss any questions you have with your health care provider. Document Revised: 01/17/2017 Document Reviewed: 01/09/2016 Elsevier Patient Education  2020 [employer].

## 2019-07-16 LAB — CBC WITH DIFFERENTIAL/PLATELET
Basophils Absolute: 0.1 10*3/uL (ref 0.0–0.2)
Basos: 1 %
EOS (ABSOLUTE): 0.2 10*3/uL (ref 0.0–0.4)
Eos: 3 %
Hematocrit: 35.8 % (ref 34.0–46.6)
Hemoglobin: 12 g/dL (ref 11.1–15.9)
Immature Grans (Abs): 0 10*3/uL (ref 0.0–0.1)
Immature Granulocytes: 0 %
Lymphocytes Absolute: 1.8 10*3/uL (ref 0.7–3.1)
Lymphs: 32 %
MCH: 30.9 pg (ref 26.6–33.0)
MCHC: 33.5 g/dL (ref 31.5–35.7)
MCV: 92 fL (ref 79–97)
Monocytes Absolute: 0.5 10*3/uL (ref 0.1–0.9)
Monocytes: 9 %
Neutrophils Absolute: 3.2 10*3/uL (ref 1.4–7.0)
Neutrophils: 55 %
Platelets: 243 10*3/uL (ref 150–450)
RBC: 3.88 x10E6/uL (ref 3.77–5.28)
RDW: 12.3 % (ref 11.7–15.4)
WBC: 5.7 10*3/uL (ref 3.4–10.8)

## 2019-07-16 LAB — COMPREHENSIVE METABOLIC PANEL
ALT: 10 IU/L (ref 0–32)
AST: 18 IU/L (ref 0–40)
Albumin/Globulin Ratio: 1.8 (ref 1.2–2.2)
Albumin: 4.2 g/dL (ref 3.5–4.6)
Alkaline Phosphatase: 89 IU/L (ref 48–121)
BUN/Creatinine Ratio: 13 (ref 12–28)
BUN: 13 mg/dL (ref 10–36)
Bilirubin Total: 0.9 mg/dL (ref 0.0–1.2)
CO2: 22 mmol/L (ref 20–29)
Calcium: 9.4 mg/dL (ref 8.7–10.3)
Chloride: 105 mmol/L (ref 96–106)
Creatinine, Ser: 0.97 mg/dL (ref 0.57–1.00)
GFR calc Af Amer: 58 mL/min/{1.73_m2} — ABNORMAL LOW (ref >59)
GFR calc non Af Amer: 51 mL/min/{1.73_m2} — ABNORMAL LOW (ref >59)
Globulin, Total: 2.3 g/dL (ref 1.5–4.5)
Glucose: 86 mg/dL (ref 65–99)
Potassium: 3.9 mmol/L (ref 3.5–5.2)
Sodium: 141 mmol/L (ref 134–144)
Total Protein: 6.5 g/dL (ref 6.0–8.5)

## 2019-07-16 LAB — LIPID PANEL
Chol/HDL Ratio: 2.7 ratio (ref 0.0–4.4)
Cholesterol, Total: 115 mg/dL (ref 100–199)
HDL: 42 mg/dL (ref >39)
LDL Chol Calc (NIH): 55 mg/dL (ref 0–99)
Triglycerides: 91 mg/dL (ref 0–149)
VLDL Cholesterol Cal: 18 mg/dL (ref 5–40)

## 2019-08-06 IMAGING — CT CT ANGIO CHEST
2 of 8 series · 18 of 36 positions shown · IV contrast (iopamidol)
Comparison: 09/01/2017 chest x-ray.

CLINICAL DATA: [AGE] female with left-sided chest pain and
upper back pain for the past 3 days. Subsequent encounter.

EXAM:
CT ANGIOGRAPHY CHEST WITH CONTRAST
TECHNIQUE: Multidetector CT imaging of the chest was performed using the
standard protocol during bolus administration of intravenous
contrast. Multiplanar CT image reconstructions and MIPs were
obtained to evaluate the vascular anatomy.
CONTRAST:  100mL 7Q1LUL-FLR IOPAMIDOL (7Q1LUL-FLR) INJECTION 76%

[Series 6: pe thins · axial · 0.66mm/px · z∈[-234,+7]mm · 17 of 271 slices shown]
[im 15/271  lung]
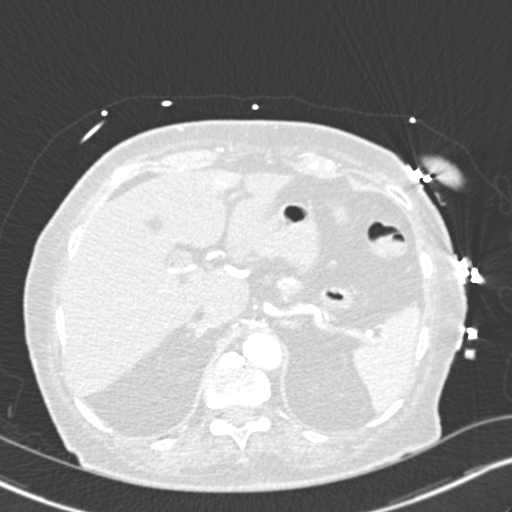
[im 29/271  mediastinal]
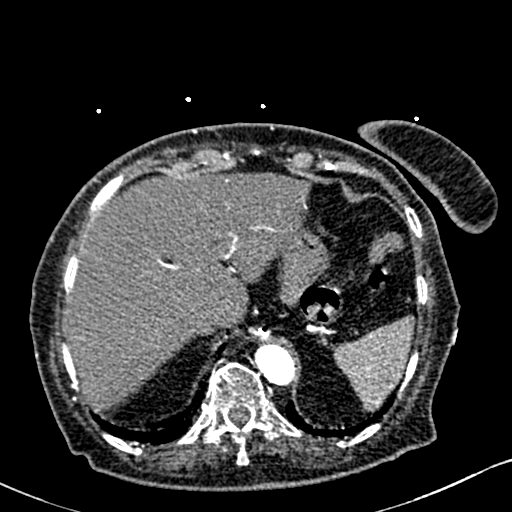
[im 43/271  lung]
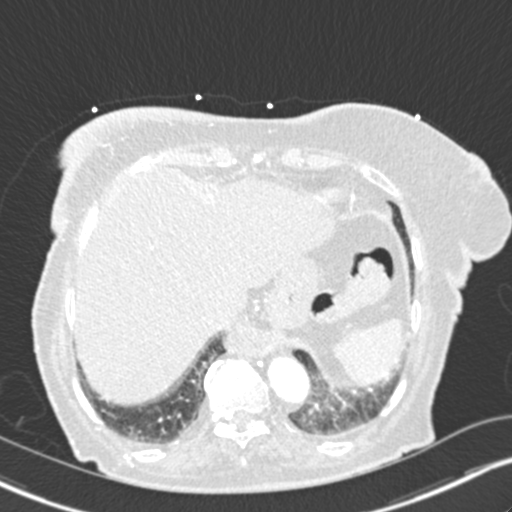
[im 57/271  mediastinal]
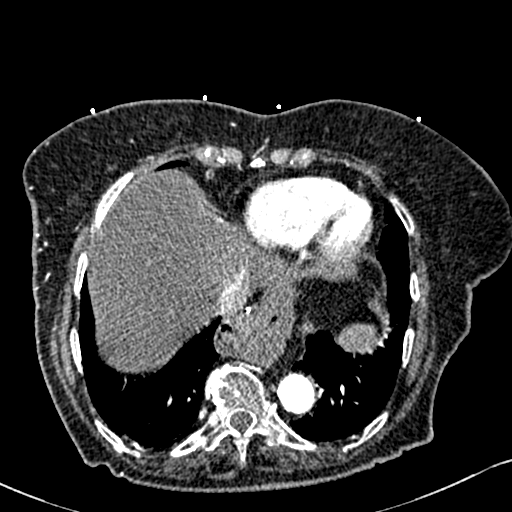
[im 72/271  lung]
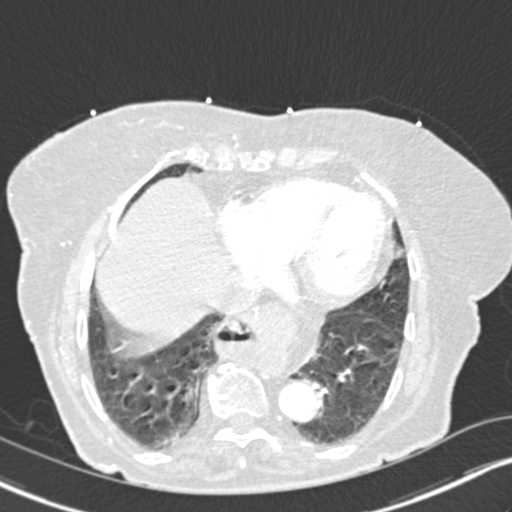
[im 86/271  mediastinal]
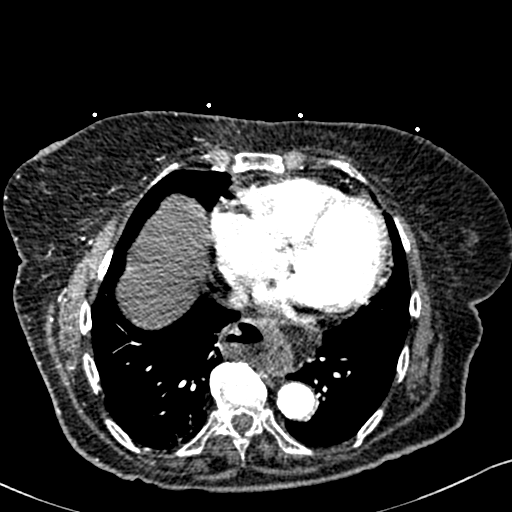
[im 100/271  lung]
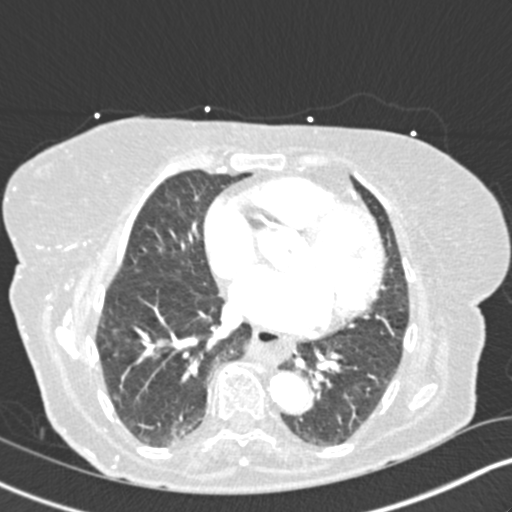
[im 114/271  mediastinal]
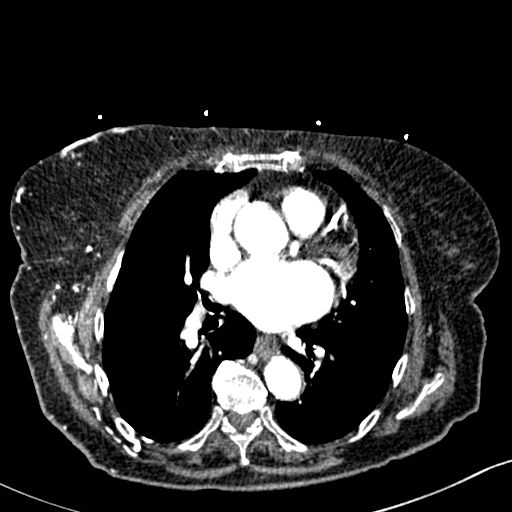
[im 143/271  lung]
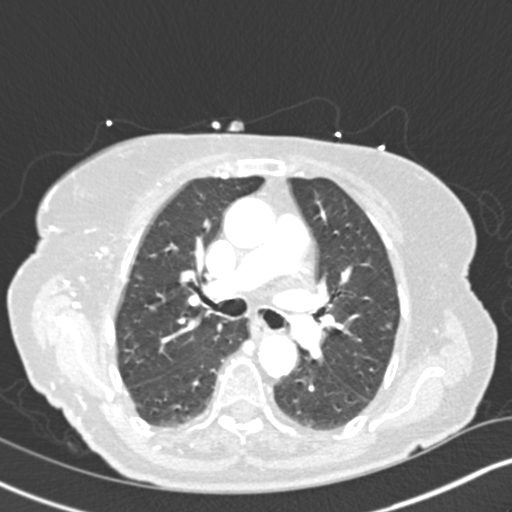
[im 157/271  mediastinal]
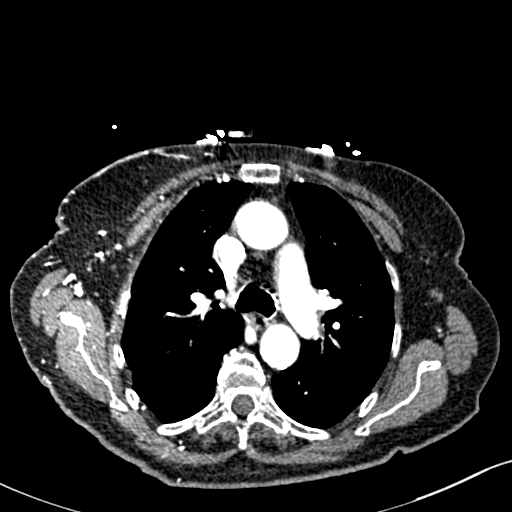
[im 171/271  lung]
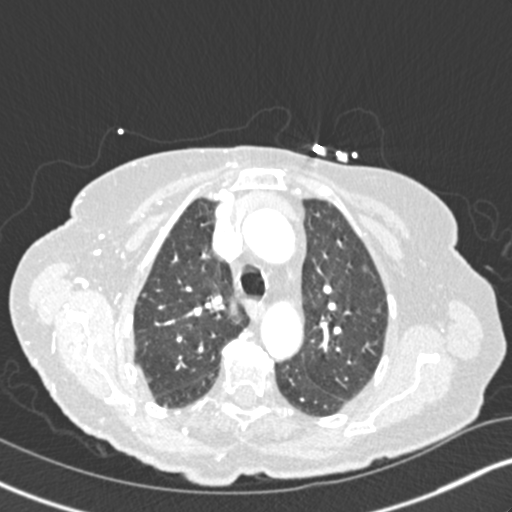
[im 185/271  mediastinal]
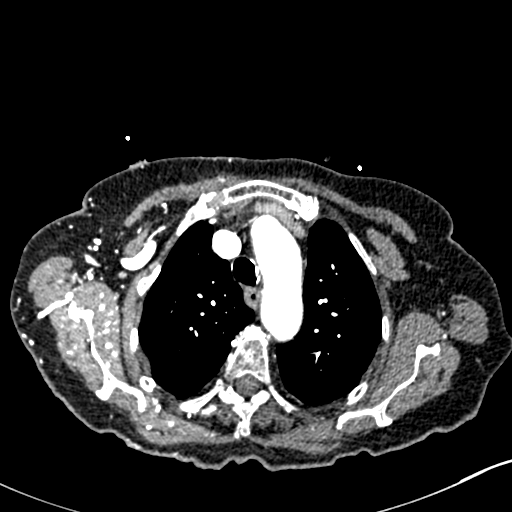
[im 199/271  lung]
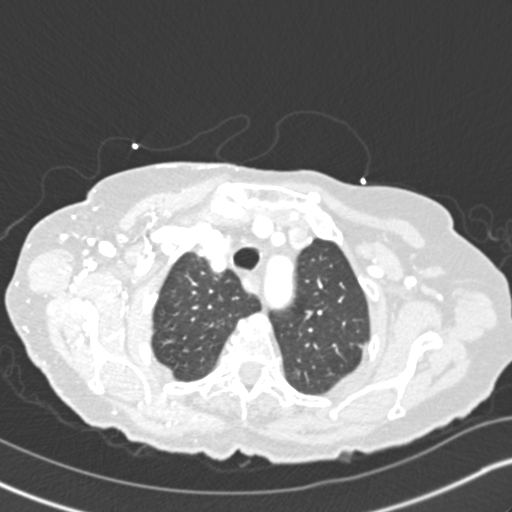
[im 214/271  mediastinal]
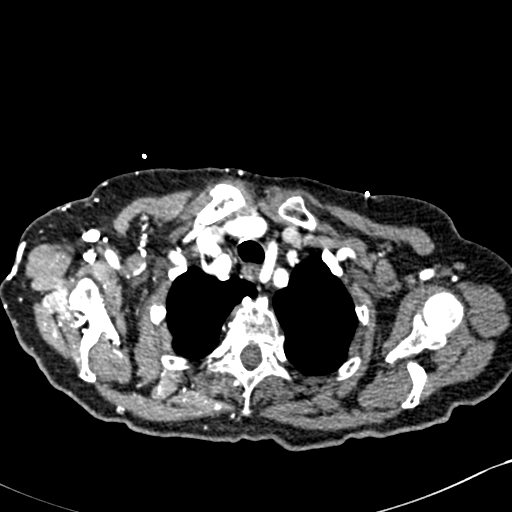
[im 228/271  lung]
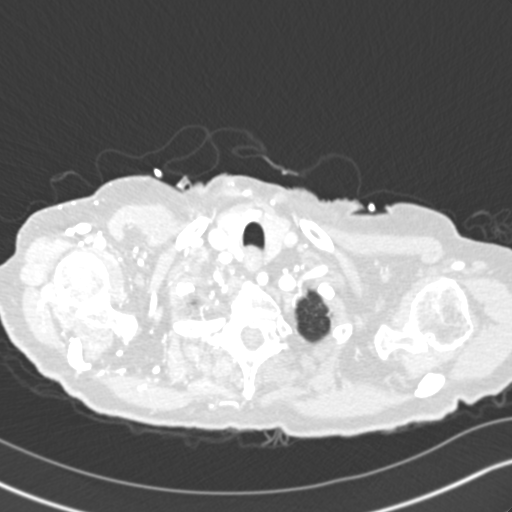
[im 242/271  mediastinal]
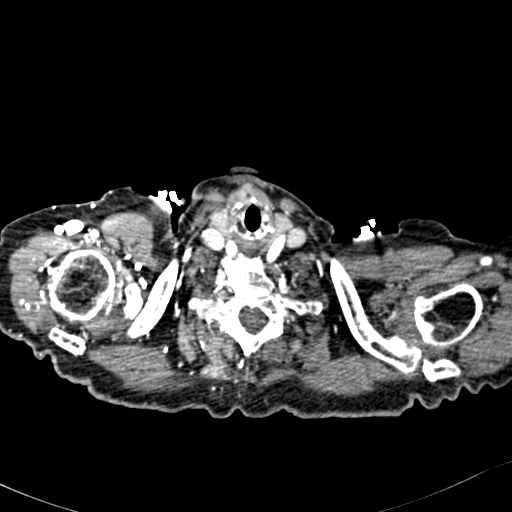
[im 256/271  lung]
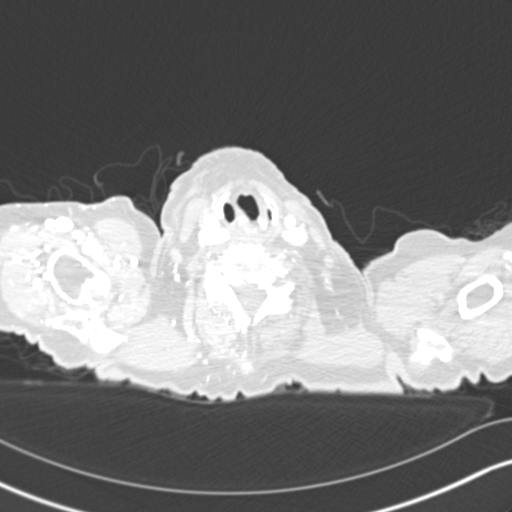

[Series 7: pe coronal mpr · coronal · 0.53mm/px · 1 of 122 slices shown]
[im 61/122  mediastinal]
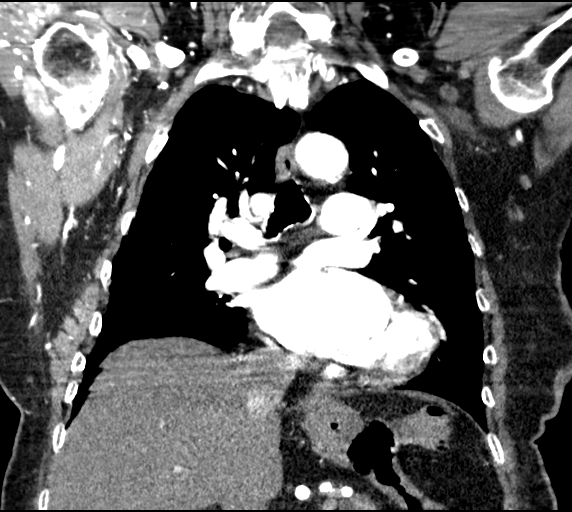

[18 of 36 positions shown; findings below may reference images not displayed]

FINDINGS: Cardiovascular: Exam is motion degraded. No pulmonary embolus or
aortic dissection. Atherosclerotic changes of the tortuous thoracic
aorta. Ascending thoracic aorta measures up to 3.2 cm.

Heart size top-normal.  Coronary artery calcifications.

Mediastinum/Nodes: Moderate size hiatal hernia. Nonspecific 3.2 x
2.3 x 1.2 cm hyperenhancement right lobe with thyroid gland.

No mediastinal hilar are or axillary adenopathy.

Lungs/Pleura: Scattered subcentimeter predominantly ground-glass
opacities along the peripheral aspect of the lungs bilaterally.
Trachea and mainstem bronchi are patent.

Upper Abdomen: Fatty liver.  No free air.

Musculoskeletal: Mild degenerative changes lower cervical and
throughout thoracic spine without compression fracture or
destructive lesion. Right sternoclavicular joint degenerative
changes.

Review of the MIP images confirms the above findings.
IMPRESSION: 1. Exam is motion degraded.  No pulmonary embolus noted.
2. Aortic Atherosclerosis ([id number]). Tortuous aorta. No evidence
of dissection or thoracic aortic aneurysm.
3. Coronary artery calcifications
4. Moderate-sized hiatal hernia.
5. Subcentimeter predominantly ground-glass nodular opacities along
the peripheral aspects of the lungs bilaterally. Non-contrast chest
CT at 3-6 months is recommended. If the nodules are stable at time
of repeat CT, then future CT at 18-24 months (from today's scan) is
considered optional for low-risk patients, but is recommended for
high-risk patients. This recommendation follows the consensus
statement: Guidelines for Management of Incidental Pulmonary Nodules
Detected on CT Images: From the [HOSPITAL] 0344; Radiology
0344; [DATE].
6. Nonspecific 3.2 x 2.3 x 1.2 cm hyperenhancement right lobe of
thyroid gland. Thyroid ultrasound can be obtained for further
delineation if clinically desired.

## 2019-09-01 ENCOUNTER — Encounter: Payer: Self-pay | Admitting: Physician Assistant

## 2019-09-14 ENCOUNTER — Ambulatory Visit: Payer: Medicare Other | Admitting: Diagnostic Neuroimaging

## 2019-09-15 ENCOUNTER — Other Ambulatory Visit: Payer: Self-pay | Admitting: Adult Health

## 2019-09-21 ENCOUNTER — Telehealth: Payer: Self-pay

## 2019-09-21 ENCOUNTER — Other Ambulatory Visit: Payer: Self-pay | Admitting: Physician Assistant

## 2019-09-21 NOTE — Telephone Encounter (Signed)
Please call pt to schedule appt for [facility].  No further refills until pt is seen.  Charyl Bigger, CMA

## 2019-09-29 ENCOUNTER — Other Ambulatory Visit: Payer: Self-pay | Admitting: Adult Health

## 2019-10-15 ENCOUNTER — Other Ambulatory Visit: Payer: Self-pay

## 2019-10-15 ENCOUNTER — Encounter: Payer: Self-pay | Admitting: Family

## 2019-10-15 ENCOUNTER — Ambulatory Visit (INDEPENDENT_AMBULATORY_CARE_PROVIDER_SITE_OTHER): Payer: Medicare Other | Admitting: Family

## 2019-10-15 VITALS — BP 118/70 | HR 67 | Temp 96.9°F | Resp 16 | Ht 61.0 in | Wt 130.4 lb

## 2019-10-15 DIAGNOSIS — G4700 Insomnia, unspecified: Secondary | ICD-10-CM | POA: Diagnosis not present

## 2019-10-15 DIAGNOSIS — R600 Localized edema: Secondary | ICD-10-CM | POA: Diagnosis not present

## 2019-10-15 DIAGNOSIS — H6121 Impacted cerumen, right ear: Secondary | ICD-10-CM

## 2019-10-15 DIAGNOSIS — R410 Disorientation, unspecified: Secondary | ICD-10-CM

## 2019-10-15 DIAGNOSIS — G250 Essential tremor: Secondary | ICD-10-CM

## 2019-10-15 DIAGNOSIS — E782 Mixed hyperlipidemia: Secondary | ICD-10-CM

## 2019-10-15 DIAGNOSIS — I10 Essential (primary) hypertension: Secondary | ICD-10-CM

## 2019-10-15 DIAGNOSIS — N183 Chronic kidney disease, stage 3 unspecified: Secondary | ICD-10-CM

## 2019-10-15 MED ORDER — DILTIAZEM HCL ER COATED BEADS 180 MG PO CP24: 180.0000 mg | ORAL_CAPSULE | Freq: Every day | ORAL | 1 refills | Status: DC

## 2019-10-15 MED ORDER — TRAZODONE HCL 50 MG PO TABS: 25.0000 mg | ORAL_TABLET | Freq: Every evening | ORAL | 3 refills | Status: DC | PRN

## 2019-10-15 MED ORDER — PRAVASTATIN SODIUM 40 MG PO TABS: 40.0000 mg | ORAL_TABLET | Freq: Every evening | ORAL | 1 refills | Status: DC

## 2019-10-15 NOTE — Patient Instructions (Signed)
-   urine specimen cup provided please collect a clean catch urine specimen on Monday morning and bring back to [facility] office will send to lab for urine analysis and culture and sensitivity. - Notify provider if running any fever,chills or worsening confusion

## 2019-10-15 NOTE — Progress Notes (Signed)
Provider: Rossanna Spitzley FNP-C   Lorrene Reid, PA-C  Patient Care Team: Lorrene Reid, PA-C as PCP - General (Physician Assistant) Magnus Sinning, MD as Consulting Physician (Physical Medicine and Rehabilitation) Bo Merino, MD as Consulting Physician (Rheumatology) Irene Shipper, MD as Consulting Physician (Gastroenterology) Webb Laws, [place] as Referring Physician (Optometry) Neva Seat (Dermatology)  Extended Emergency Contact Information Primary Emergency Contact: Hessie Dibble of [place] Phone: [phone] Mobile Phone: [phone] Relation: Daughter Secondary Emergency Contact: Sweeney,Cheryl Address: [place], [place] Phone: [phone] Mobile Phone: [phone] Relation: Daughter  Code Status: Full Code  Goals of care: Advanced Directive information Advanced Directives 10/15/2019  Does Patient Have a Medical Advance Directive? Yes  Type of Advance Directive Living will;Healthcare Power of Attorney  Does patient want to make changes to medical advance directive? No - Patient declined  Copy of [place] in Chart? Yes - validated most recent copy scanned in chart (See row information)  Would patient like information on creating a medical advance directive? -     Chief Complaint  Patient presents with  . Establish Care    New Patient.  . Complains    Patient complain of possible urinary tract infection, and hallucinations.    HPI:  Pt is a 86 y.o. female seen today establish care at [facility]  for medical management of chronic diseases.she has a medical history.Has had increased confusion in the past week. Not eating well .Drinks 3 doctor pepper most of the time per day.likes sweets.she lives by herself but daughters visit more often.she does not cook but warms food up in the microwave.     Hypertension- B/p 110/60 - 150's/60 HR 50's -60 on  diltiazem 180 mg 24 hr capsule.she denies any symptoms of hypotension.    Hyperlipidemia - on pravastatin 40 mg tablet every evening.Diet as discussed above.  Tremors - chronic.does not limit her ability for to perform her ADL's.   Insomnia - melatonin ineffective.Also Benadryl.discussed high risk for sedation and falls with use of benadryl. Up to date on vaccine except COVID-19 vaccine and Influenza vaccine.   Past Medical History:  Diagnosis Date  . Abnormal involuntary movements(781.0)   . Anemia   . Anxiety state, unspecified   . Bilateral swelling of feet   . Blood transfusion   . Cardiac dysrhythmia, unspecified   . Chronic constipation   . Dementia ([place])   . DJD (degenerative joint disease)   . Esophageal stricture   . Generalized osteoarthrosis, unspecified site   . GERD (gastroesophageal reflux disease)   . Hearing loss   . Hiatal hernia   . Hypertension   . Macular hole of left eye   . Osteoporosis   . Pelvic adhesions   . Postgastrectomy syndrome   . Pure hypercholesterolemia   . Tortuous colon   . Tremor   . Urinary tract infection, site not specified   . Venous insufficiency   . Vitamin B12 deficiency    Past Surgical History:  Procedure Laterality Date  . CHOLECYSTECTOMY    . COLONOSCOPY    . ESOPHAGUS SURGERY  2003  . mole biopsy- left neck area Left   . NISSEN FUNDOPLICATION    . TOTAL ABDOMINAL HYSTERECTOMY     DUB,   . UPPER GASTROINTESTINAL ENDOSCOPY      No Known Allergies  Allergies as of 10/15/2019   No Known Allergies  Medication List       Accurate as of October 15, 2019  2:27 PM. If you have any questions, ask your nurse or doctor.        STOP taking these medications   AZO TABS PO Stopped by: Sandrea Hughs, NP   MELATONIN PO Stopped by: Sandrea Hughs, NP     TAKE these medications   CRANBERRY PO Take 25,000 mg by mouth.   diltiazem 180 MG 24 hr capsule Commonly known as: CARDIZEM CD Take 180 mg by mouth  daily. What changed: Another medication with the same name was removed. Continue taking this medication, and follow the directions you see here. Changed by: Sandrea Hughs, NP   melatonin 5 MG Tabs Take 5 mg by mouth at bedtime.   metoprolol succinate 25 MG 24 hr tablet Commonly known as: TOPROL-XL Take 12.5 mg by mouth daily.   OCUVITE PO Take 1 tablet by mouth daily.   OMEPRAZOLE PO Take 20 mg by mouth daily.   pravastatin 40 MG tablet Commonly known as: PRAVACHOL TAKE 1 TABLET BY MOUTH  EVERY EVENING       Review of Systems  Constitutional: Positive for appetite change. Negative for chills, fatigue and fever.  HENT: Positive for hearing loss. Negative for congestion, ear pain, postnasal drip, sinus pressure, sinus pain, sneezing, sore throat and trouble swallowing.   Eyes: Positive for visual disturbance. Negative for discharge and redness.       Ophthalmology annual wears eye glasses   Respiratory: Negative for cough, chest tightness, shortness of breath and wheezing.   Cardiovascular: Positive for leg swelling. Negative for chest pain and palpitations.  Gastrointestinal: Positive for constipation. Negative for abdominal distention, abdominal pain, diarrhea, nausea and vomiting.       At time diarrhea   Endocrine: Negative for cold intolerance, heat intolerance, polydipsia, polyphagia and polyuria.  Genitourinary: Negative for difficulty urinating, dysuria, frequency and urgency.  Musculoskeletal: Positive for gait problem. Negative for arthralgias, back pain, joint swelling and myalgias.       Uses a walker and a cane   Skin: Negative for color change, pallor and rash.       Mole on the back   Neurological: Negative for dizziness, speech difficulty, weakness, light-headedness, numbness and headaches.  Hematological: Does not bruise/bleed easily.  Psychiatric/Behavioral: Positive for confusion and sleep disturbance. Negative for agitation and behavioral problems. The  patient is not nervous/anxious.     Immunization History  Administered Date(s) Administered  . Fluad Quad(high Dose 65+) 12/16/2018  . Influenza Split 12/04/2010, 11/12/2011  . Influenza Whole 11/13/2006, 11/12/2007, 11/17/2008, 11/15/2009  . Influenza, High Dose Seasonal PF 11/07/2014  . Influenza,inj,Quad PF,6+ Mos 11/11/2012  . Influenza-Unspecified 11/18/2013, 12/11/2015, 11/07/2016, 12/10/2017  . Pneumococcal Conjugate-13 05/03/2014  . Pneumococcal Polysaccharide-23 12/10/2002, 04/19/2014  . Tdap 03/13/2016   Pertinent  Health Maintenance Due  Topic Date Due  . INFLUENZA VACCINE  09/19/2019  . DEXA SCAN  Completed  . PNA vac Low Risk Adult  Completed   Fall Risk  10/15/2019 06/17/2018 10/03/2016 09/04/2016 05/11/2015  Falls in the past year? 0 0 No No No  Number falls in past yr: 0 - - - -  Injury with Fall? 0 - - - -  Risk Factor Category  - - - - -  Risk for fall due to : - - - - -  Follow up - Falls evaluation completed - - -     Vitals:   10/15/19 1354  BP: 118/70  Pulse: 67  Resp: 16  Temp: (!) 96.9 F (36.1 C)  SpO2: 96%  Weight: 130 lb 6.4 oz (59.1 kg)  Height: 5' 1"  (1.549 m)   Body mass index is 24.64 kg/m. Physical Exam Vitals reviewed.  Constitutional:      General: She is not in acute distress.    Appearance: She is not ill-appearing.  HENT:     Head: Normocephalic.     Right Ear: There is impacted cerumen.     Left Ear: Tympanic membrane, ear canal and external ear normal. There is no impacted cerumen.     Ears:     Comments: Ear lavaged with warm water and peroxide.No instrument used.No signs of infection tolerated procedure well.     Nose: Nose normal. No congestion or rhinorrhea.     Mouth/Throat:     Mouth: Mucous membranes are moist.     Pharynx: Oropharynx is clear. No oropharyngeal exudate or posterior oropharyngeal erythema.  Eyes:     General: No scleral icterus.       Right eye: No discharge.        Left eye: No discharge.      Extraocular Movements: Extraocular movements intact.     Conjunctiva/sclera: Conjunctivae normal.     Pupils: Pupils are equal, round, and reactive to light.     Comments: Corrective lens in place   Neck:     Vascular: No carotid bruit.  Cardiovascular:     Rate and Rhythm: Normal rate and regular rhythm.     Pulses: Normal pulses.     Heart sounds: Normal heart sounds. No murmur heard.  No friction rub. No gallop.   Pulmonary:     Effort: Pulmonary effort is normal. No respiratory distress.     Breath sounds: Normal breath sounds. No wheezing, rhonchi or rales.  Chest:     Chest wall: No tenderness.  Abdominal:     General: Bowel sounds are normal. There is no distension.     Palpations: Abdomen is soft. There is no mass.     Tenderness: There is no abdominal tenderness. There is no right CVA tenderness, left CVA tenderness, guarding or rebound.  Musculoskeletal:        General: No swelling or tenderness.     Cervical back: Normal range of motion. No rigidity or tenderness.     Comments: Unsteady gait holds onto wall when walking.No cane or walker during visit bilateral lower extremities trace -1+ edema  Lymphadenopathy:     Cervical: No cervical adenopathy.  Skin:    General: Skin is warm.     Coloration: Skin is not pale.     Findings: No bruising, erythema, lesion or rash.  Neurological:     Mental Status: She is alert.     Cranial Nerves: No cranial nerve deficit.     Sensory: Sensory deficit present.     Motor: No weakness.     Coordination: Coordination normal.     Gait: Gait abnormal.     Comments: Monofilament sensation decreased to both feet   Psychiatric:        Mood and Affect: Mood normal.        Speech: Speech normal.        Behavior: Behavior normal.        Thought Content: Thought content normal.        Cognition and Memory: Memory is impaired.        Judgment: Judgment normal.  Labs reviewed: Recent Labs    07/15/19 0853  NA 141  K 3.9  CL 105   CO2 22  GLUCOSE 86  BUN 13  CREATININE 0.97  CALCIUM 9.4   Recent Labs    07/15/19 0853  AST 18  ALT 10  ALKPHOS 89  BILITOT 0.9  PROT 6.5  ALBUMIN 4.2   Recent Labs    07/15/19 0853  WBC 5.7  NEUTROABS 3.2  HGB 12.0  HCT 35.8  MCV 92  PLT 243   Lab Results  Component Value Date   TSH 2.450 10/08/2018   Lab Results  Component Value Date   HGBA1C 5.3 10/08/2018   Lab Results  Component Value Date   CHOL 115 07/15/2019   HDL 42 07/15/2019   LDLCALC 55 07/15/2019   LDLDIRECT 157.8 05/18/2010   TRIG 91 07/15/2019   CHOLHDL 2.7 07/15/2019    Significant Diagnostic Results in last 30 days:  No results found.  Assessment/Plan 1. Essential hypertension, benign B/p at goal. Continue on diltiazem 180 mg 24 Hr capsule daily  - CBC with Differential/Platelet - CMP with eGFR([facility]) - TSH - diltiazem (CARDIZEM CD) 180 MG 24 hr capsule; Take 1 capsule (180 mg total) by mouth daily.  Dispense: 90 capsule; Refill: 1  2. Insomnia, unspecified type Start on Trazodone 25 - 50  mg tablet at bedtime. - advised to D/c Benadryl due to high risk for sedation and falls due to her advance age.  - traZODone (DESYREL) 50 MG tablet; Take 0.5-1 tablets (25-50 mg total) by mouth at bedtime as needed for sleep.  Dispense: 30 tablet; Refill: 3  3. Mixed hyperlipidemia Late  LDL on chart at goal.  - continue on pravastatin 40 mg tablet  - pravastatin (PRAVACHOL) 40 MG tablet; Take 1 tablet (40 mg total) by mouth every evening.  Dispense: 90 tablet; Refill: 1  4. Benign essential tremor Chronic.No effect on her ADL's. - continue to monitor.  5. Lower extremity edema Trace -1+ to both lower extremities.No signs of fluid overload. - encourage to keep legs elevated when seated.   6. CKD (chronic kidney disease), symptom management only, stage 3 (moderate) CR 0.97  Continue to avoid Nephrotoxins and dose all medication for renal clearance.   7. Right ear impacted cerumen Ear  lavaged with warm water and peroxide.No instrument used.No signs of infection tolerated procedure well.   8.confusion  Unable t provide urine specimen  - urine specimen cup provided please collect a clean catch urine specimen on Monday morning and bring back to [facility] [place] office will send to lab for urine analysis and culture and sensitivity. - Notify provider if running any fever,chills or worsening confusion  Family/ staff Communication: Reviewed plan of care with patient and daughter   Labs/tests ordered: urine culture   Next Appointment : 6 months for medical management of chronic issues   Sandrea Hughs, NP

## 2019-10-16 LAB — CBC WITH DIFFERENTIAL/PLATELET
Absolute Monocytes: 540 cells/uL (ref 200–950)
Basophils Absolute: 78 cells/uL (ref 0–200)
Basophils Relative: 1.2 %
Eosinophils Absolute: 260 cells/uL (ref 15–500)
Eosinophils Relative: 4 %
HCT: 36.9 % (ref 35.0–45.0)
Hemoglobin: 12.2 g/dL (ref 11.7–15.5)
Lymphs Abs: 2113 cells/uL (ref 850–3900)
MCH: 30.6 pg (ref 27.0–33.0)
MCHC: 33.1 g/dL (ref 32.0–36.0)
MCV: 92.5 fL (ref 80.0–100.0)
MPV: 10.8 fL (ref 7.5–12.5)
Monocytes Relative: 8.3 %
Neutro Abs: 3510 cells/uL (ref 1500–7800)
Neutrophils Relative %: 54 %
Platelets: 278 10*3/uL (ref 140–400)
RBC: 3.99 10*6/uL (ref 3.80–5.10)
RDW: 12.6 % (ref 11.0–15.0)
Total Lymphocyte: 32.5 %
WBC: 6.5 10*3/uL (ref 3.8–10.8)

## 2019-10-16 LAB — COMPLETE METABOLIC PANEL WITH GFR
AG Ratio: 1.5 (calc) (ref 1.0–2.5)
ALT: 11 U/L (ref 6–29)
AST: 19 U/L (ref 10–35)
Albumin: 3.8 g/dL (ref 3.6–5.1)
Alkaline phosphatase (APISO): 71 U/L (ref 37–153)
BUN/Creatinine Ratio: 13 (calc) (ref 6–22)
BUN: 13 mg/dL (ref 7–25)
CO2: 26 mmol/L (ref 20–32)
Calcium: 9 mg/dL (ref 8.6–10.4)
Chloride: 104 mmol/L (ref 98–110)
Creat: 1.04 mg/dL — ABNORMAL HIGH (ref 0.60–0.88)
GFR, Est African American: 54 mL/min/{1.73_m2} — ABNORMAL LOW (ref >=60)
GFR, Est Non African American: 46 mL/min/{1.73_m2} — ABNORMAL LOW (ref >=60)
Globulin: 2.6 g/dL (calc) (ref 1.9–3.7)
Glucose, Bld: 110 mg/dL (ref 65–139)
Potassium: 3.4 mmol/L — ABNORMAL LOW (ref 3.5–5.3)
Sodium: 138 mmol/L (ref 135–146)
Total Bilirubin: 1.1 mg/dL (ref 0.2–1.2)
Total Protein: 6.4 g/dL (ref 6.1–8.1)

## 2019-10-16 LAB — TSH: TSH: 1.48 mIU/L (ref 0.40–4.50)

## 2019-10-18 ENCOUNTER — Other Ambulatory Visit (INDEPENDENT_AMBULATORY_CARE_PROVIDER_SITE_OTHER): Payer: Medicare Other

## 2019-10-18 DIAGNOSIS — E876 Hypokalemia: Secondary | ICD-10-CM

## 2019-10-18 DIAGNOSIS — R410 Disorientation, unspecified: Secondary | ICD-10-CM

## 2019-10-18 LAB — POCT URINALYSIS DIPSTICK
Bilirubin, UA: NEGATIVE
Glucose, UA: NEGATIVE
Ketones, UA: NEGATIVE
Nitrite, UA: POSITIVE
Protein, UA: POSITIVE — AB
Spec Grav, UA: 1.01 (ref 1.010–1.025)
Urobilinogen, UA: 2 E.U./dL — AB
pH, UA: 6 (ref 5.0–8.0)

## 2019-10-18 NOTE — Addendum Note (Signed)
Addended by: Logan Bores on: 10/18/2019 02:37 PM   Modules accepted: Orders

## 2019-10-19 ENCOUNTER — Ambulatory Visit: Payer: Medicare Other | Admitting: Family

## 2019-10-21 ENCOUNTER — Other Ambulatory Visit: Payer: Self-pay

## 2019-10-21 DIAGNOSIS — B9689 Other specified bacterial agents as the cause of diseases classified elsewhere: Secondary | ICD-10-CM

## 2019-10-21 LAB — URINE CULTURE
MICRO NUMBER:: 10891404
SPECIMEN QUALITY:: ADEQUATE

## 2019-10-21 MED ORDER — SACCHAROMYCES BOULARDII 250 MG PO CAPS: 250.0000 mg | ORAL_CAPSULE | Freq: Two times a day (BID) | ORAL | 0 refills | Status: DC

## 2019-10-21 MED ORDER — CIPROFLOXACIN HCL 500 MG PO TABS: 500.0000 mg | ORAL_TABLET | Freq: Two times a day (BID) | ORAL | 0 refills | Status: DC

## 2019-10-21 NOTE — Progress Notes (Signed)
Cipro and florastor send to pharmacy

## 2019-10-24 ENCOUNTER — Emergency Department (HOSPITAL_COMMUNITY): Payer: Medicare Other

## 2019-10-24 ENCOUNTER — Encounter (HOSPITAL_COMMUNITY): Payer: Self-pay

## 2019-10-24 ENCOUNTER — Emergency Department (HOSPITAL_BASED_OUTPATIENT_CLINIC_OR_DEPARTMENT_OTHER): Payer: Medicare Other

## 2019-10-24 ENCOUNTER — Other Ambulatory Visit: Payer: Self-pay

## 2019-10-24 ENCOUNTER — Emergency Department (HOSPITAL_COMMUNITY)
Admission: EM | Admit: 2019-10-24 | Discharge: 2019-10-24 | Disposition: A | Discharge status: Home / Self Care | Payer: Medicare Other | Attending: Emergency Medicine | Admitting: Emergency Medicine

## 2019-10-24 ENCOUNTER — Telehealth: Payer: Self-pay | Admitting: Adult Health

## 2019-10-24 DIAGNOSIS — M25552 Pain in left hip: Secondary | ICD-10-CM | POA: Diagnosis not present

## 2019-10-24 DIAGNOSIS — I10 Essential (primary) hypertension: Secondary | ICD-10-CM | POA: Diagnosis not present

## 2019-10-24 DIAGNOSIS — R404 Transient alteration of awareness: Secondary | ICD-10-CM | POA: Diagnosis not present

## 2019-10-24 DIAGNOSIS — R410 Disorientation, unspecified: Secondary | ICD-10-CM | POA: Diagnosis not present

## 2019-10-24 DIAGNOSIS — N183 Chronic kidney disease, stage 3 unspecified: Secondary | ICD-10-CM | POA: Diagnosis not present

## 2019-10-24 DIAGNOSIS — M79609 Pain in unspecified limb: Secondary | ICD-10-CM | POA: Diagnosis not present

## 2019-10-24 DIAGNOSIS — I499 Cardiac arrhythmia, unspecified: Secondary | ICD-10-CM | POA: Diagnosis not present

## 2019-10-24 DIAGNOSIS — M7989 Other specified soft tissue disorders: Secondary | ICD-10-CM

## 2019-10-24 DIAGNOSIS — I129 Hypertensive chronic kidney disease with stage 1 through stage 4 chronic kidney disease, or unspecified chronic kidney disease: Secondary | ICD-10-CM | POA: Diagnosis not present

## 2019-10-24 DIAGNOSIS — Z743 Need for continuous supervision: Secondary | ICD-10-CM | POA: Diagnosis not present

## 2019-10-24 DIAGNOSIS — E039 Hypothyroidism, unspecified: Secondary | ICD-10-CM | POA: Insufficient documentation

## 2019-10-24 DIAGNOSIS — Z79899 Other long term (current) drug therapy: Secondary | ICD-10-CM | POA: Insufficient documentation

## 2019-10-24 DIAGNOSIS — M79605 Pain in left leg: Secondary | ICD-10-CM | POA: Insufficient documentation

## 2019-10-24 DIAGNOSIS — K439 Ventral hernia without obstruction or gangrene: Secondary | ICD-10-CM | POA: Diagnosis not present

## 2019-10-24 DIAGNOSIS — K469 Unspecified abdominal hernia without obstruction or gangrene: Secondary | ICD-10-CM | POA: Diagnosis not present

## 2019-10-24 LAB — BASIC METABOLIC PANEL
Anion gap: 11 (ref 5–15)
BUN: 10 mg/dL (ref 8–23)
CO2: 22 mmol/L (ref 22–32)
Calcium: 9.4 mg/dL (ref 8.9–10.3)
Chloride: 107 mmol/L (ref 98–111)
Creatinine, Ser: 0.94 mg/dL (ref 0.44–1.00)
GFR calc Af Amer: >60 mL/min (ref >60)
GFR calc non Af Amer: 52 mL/min — ABNORMAL LOW (ref >60)
Glucose, Bld: 107 mg/dL — ABNORMAL HIGH (ref 70–99)
Potassium: 3.6 mmol/L (ref 3.5–5.1)
Sodium: 140 mmol/L (ref 135–145)

## 2019-10-24 LAB — CBC
HCT: 38.2 % (ref 36.0–46.0)
Hemoglobin: 12.4 g/dL (ref 12.0–15.0)
MCH: 30.2 pg (ref 26.0–34.0)
MCHC: 32.5 g/dL (ref 30.0–36.0)
MCV: 92.9 fL (ref 80.0–100.0)
Platelets: 271 10*3/uL (ref 150–400)
RBC: 4.11 MIL/uL (ref 3.87–5.11)
RDW: 13 % (ref 11.5–15.5)
WBC: 7.2 10*3/uL (ref 4.0–10.5)
nRBC: 0 % (ref 0.0–0.2)

## 2019-10-24 LAB — CBG MONITORING, ED: Glucose-Capillary: 99 mg/dL (ref 70–99)

## 2019-10-24 MED ORDER — HYDROCODONE-ACETAMINOPHEN 5-325 MG PO TABS
1.0000 | ORAL_TABLET | ORAL | 0 refills | Status: DC | PRN
Start: 2019-10-24 — End: 2020-01-10

## 2019-10-24 NOTE — Progress Notes (Signed)
Lower extremity venous LT study completed  Preliminary results relayed to Sabra Heck, MD and [place], [place].   See CV Proc for preliminary results report.   [name]

## 2019-10-24 NOTE — ED Provider Notes (Signed)
[name] Provider Note   CSN: [id number] Arrival date & time: 10/24/19  0725     History No chief complaint on file.   [name] is a 86 y.o. female.  86 year old female brought in by EMS from home with left leg pain.  Patient's daughter is at bedside says that patient was sitting in her recliner today when she began complaining of pain in her left thigh moving towards her groin.  Family is concerned she has a DVT, has noticed swelling to both ankles.  No history of prior DVT, is not anticoagulated.  Denies falls.  Family states that patient is on Cipro for UTI, read the side effects were also concerned about this.  Patient states that she has some low back pain from sitting in the lobby today otherwise has no complaints at this time.  No other complaints or concerns today.        Past Medical History:  Diagnosis Date  . Abnormal involuntary movements(781.0)   . Anemia   . Anxiety state, unspecified   . Bilateral swelling of feet   . Blood transfusion   . Cardiac dysrhythmia, unspecified   . Chronic constipation   . Dementia ([name])   . DJD (degenerative joint disease)   . Esophageal stricture   . Generalized osteoarthrosis, unspecified site   . GERD (gastroesophageal reflux disease)   . Hearing loss   . Hiatal hernia   . Hypertension   . Macular hole of left eye   . Osteoporosis   . Pelvic adhesions   . Postgastrectomy syndrome   . Pure hypercholesterolemia   . Tortuous colon   . Tremor   . Urinary tract infection, site not specified   . Venous insufficiency   . Vitamin B12 deficiency     Patient Active Problem List   Diagnosis Date Noted  . UTI (urinary tract infection) 10/08/2018  . Encounter for Medicare annual wellness exam 06/17/2018  . Abdominal pain, acute, left lower quadrant 09/01/2017  . Other chest pain 09/01/2017  . CKD (chronic kidney disease), symptom management only, stage 3 (moderate) 07/03/2017  .  Cognitive changes 03/05/2017  . Healthcare maintenance 03/05/2017  . Hypertension 03/05/2017  . Cervicalgia 10/10/2016  . Lower extremity edema 10/03/2016  . Dyspnea on exertion 10/03/2016  . Right arm pain 09/04/2016  . Migraine 09/04/2016  . Numerous moles 09/04/2016  . Frequent headaches 09/04/2016  . Chronic insomnia 05/11/2015  . Vitamin D deficiency 05/17/2014  . Counseling regarding end of life decision making 05/03/2014  . Moderate dementia ([name]) 05/03/2014  . Lumbago 05/11/2012  . Vitamin B 12 deficiency 05/11/2012  . Dysphagia, pharyngoesophageal phase 12/17/2010  . Esophageal reflux 12/17/2010  . Abdominal hernia 12/17/2010  . Anemia, iron deficiency 12/06/2010  . HYPERCHOLESTEROLEMIA, MILD 11/19/2008  . Venous (peripheral) insufficiency 11/14/2007  . Essential hypertension, benign 05/14/2007  . Arrhythmia 05/14/2007  . CONSTIPATION, CHRONIC 05/14/2007  . Osteoporosis 05/14/2007  . Benign essential tremor 05/14/2007  . HIATAL HERNIA 12/18/2006  . DEGENERATIVE JOINT DISEASE, GENERALIZED 12/18/2006    Past Surgical History:  Procedure Laterality Date  . CHOLECYSTECTOMY    . COLONOSCOPY    . ESOPHAGUS SURGERY  2003  . mole biopsy- left neck area Left   . NISSEN FUNDOPLICATION    . TOTAL ABDOMINAL HYSTERECTOMY     DUB,   . UPPER GASTROINTESTINAL ENDOSCOPY       OB History   No obstetric history on file.  Family History  Problem Relation Age of Onset  . Cancer Mother 71       unknown type, most likely colon  . Heart disease Brother 44  . Stroke Sister 35  . High blood pressure Son 72  . Graves' disease Daughter   . High blood pressure Daughter 69  . High blood pressure Daughter 64  . Irritable bowel syndrome Daughter   . Heart failure Daughter 35    Social History   Tobacco Use  . Smoking status: Never Smoker  . Smokeless tobacco: Never Used  Substance Use Topics  . Alcohol use: Never    Alcohol/week: 0.0 standard drinks  . Drug use:  Never    Home Medications Prior to Admission medications   Medication Sig Start Date End Date Taking? Authorizing Provider  ciprofloxacin (CIPRO) 500 MG tablet Take 1 tablet (500 mg total) by mouth 2 (two) times daily. 10/21/19  Yes Ngetich, Dinah C, NP  diltiazem (CARDIZEM CD) 180 MG 24 hr capsule Take 1 capsule (180 mg total) by mouth daily. 10/15/19 01/13/20 Yes Ngetich, Dinah C, NP  diphenhydrAMINE (BENADRYL) 2 % cream Apply 1 application topically daily as needed for itching.   Yes [provider]  melatonin 5 MG TABS Take 5 mg by mouth at bedtime.   Yes [provider]  OMEPRAZOLE PO Take 20 mg by mouth daily.    Yes [provider]  pravastatin (PRAVACHOL) 40 MG tablet Take 1 tablet (40 mg total) by mouth every evening. 10/15/19  Yes Ngetich, Dinah C, NP  saccharomyces boulardii (FLORASTOR) 250 MG capsule Take 1 capsule (250 mg total) by mouth 2 (two) times daily. 10/21/19  Yes Ngetich, Dinah C, NP  traZODone (DESYREL) 50 MG tablet Take 0.5-1 tablets (25-50 mg total) by mouth at bedtime as needed for sleep. 10/15/19  Yes Ngetich, Dinah C, NP  HYDROcodone-acetaminophen (NORCO/VICODIN) 5-325 MG tablet Take 1 tablet by mouth every 4 (four) hours as needed. 10/24/19   Tacy Learn, PA-C  diltiazem (DILACOR XR) 180 MG 24 hr capsule Take 1 capsule (180 mg total) by mouth daily. 12/04/10 12/30/11  Noralee Space, MD    Allergies    Patient has no known allergies.  Review of Systems   Review of Systems  Unable to perform ROS: Dementia    Physical Exam Updated Vital Signs BP (!) 152/128 (BP Location: Right Arm)   Pulse 71   Temp 98.4 F (36.9 C) (Oral)   Resp 16   SpO2 95%   Physical Exam Vitals and nursing note reviewed.  Constitutional:      General: She is not in acute distress.    Appearance: She is well-developed. She is not diaphoretic.  HENT:     Head: Normocephalic and atraumatic.  Cardiovascular:     Pulses: Normal pulses.  Pulmonary:      Effort: Pulmonary effort is normal.  Abdominal:     Palpations: Abdomen is soft.     Tenderness: There is no abdominal tenderness.  Musculoskeletal:        General: Swelling present. No tenderness or deformity.     Thoracic back: No tenderness or bony tenderness.     Lumbar back: No tenderness or bony tenderness.     Comments: Mild swelling noted to bilateral ankles, no significant pitting edema, no calf swelling or tenderness, no pain with range of motion of knees or hips.  Skin:    General: Skin is warm and dry.     Coloration: Skin  is not jaundiced.     Findings: No bruising, erythema or rash.  Neurological:     Mental Status: She is alert. Mental status is at baseline.     Sensory: No sensory deficit.     Motor: No weakness.  Psychiatric:        Behavior: Behavior normal.     ED Results / Procedures / Treatments   Labs (all labs ordered are listed, but only abnormal results are displayed) Labs Reviewed  BASIC METABOLIC PANEL - Abnormal; Notable for the following components:      Result Value   Glucose, Bld 107 (*)    GFR calc non Af Amer 52 (*)    All other components within normal limits  CBC  CBG MONITORING, ED    EKG EKG Interpretation  Date/Time:  Sunday October 24 2019 07:39:39 EDT Ventricular Rate:  69 PR Interval:  192 QRS Duration: 82 QT Interval:  448 QTC Calculation: 480 R Axis:   -16 Text Interpretation: Normal sinus rhythm Nonspecific T wave abnormality Prolonged QT Abnormal ECG since last tracing no significant change Confirmed by Noemi Chapel [id number]) on 10/24/2019 12:20:04 PM   Radiology DG Hip Unilat With Pelvis 2-3 Views Left  Result Date: 10/24/2019 CLINICAL DATA:  Patient complaining of not being able to be still. States that this all started yesterday and states she is restless, also complains of left sided leg and hip pain, mild swelling noted to bilateral legs. EXAM: DG HIP (WITH OR WITHOUT PELVIS) 2-3V LEFT COMPARISON:  None. FINDINGS: No  fracture or bone lesion. Hip joints, SI joints and symphysis pubis are normally spaced and aligned. Disc degenerative changes are noted of the visualized lower lumbar spine. Skeletal structures are diffusely demineralized. Hernia mesh projects over the lower abdomen. Soft tissues are otherwise unremarkable. IMPRESSION: 1. No fracture, bone lesion or hip joint abnormality. Electronically Signed   By: Lajean Manes M.D.   On: 10/24/2019 08:24   VAS [place] LOWER EXTREMITY VENOUS (DVT) ([facility] and [facility] 7a-7p)  Result Date: 10/24/2019  Lower Venous DVTStudy Indications: Pain, and Swelling.  Comparison Study: No prior studies. Performing Technologist: Darlin Coco  Examination Guidelines: A complete evaluation includes B-mode imaging, spectral Doppler, color Doppler, and power Doppler as needed of all accessible portions of each vessel. Bilateral testing is considered an integral part of a complete examination. Limited examinations for reoccurring indications may be performed as noted. The reflux portion of the exam is performed with the patient in reverse Trendelenburg.  +-----+---------------+---------+-----------+----------+--------------+ RIGHTCompressibilityPhasicitySpontaneityPropertiesThrombus Aging +-----+---------------+---------+-----------+----------+--------------+ CFV  Full           Yes      Yes                                 +-----+---------------+---------+-----------+----------+--------------+   +---------+---------------+---------+-----------+----------+--------------+ LEFT     CompressibilityPhasicitySpontaneityPropertiesThrombus Aging +---------+---------------+---------+-----------+----------+--------------+ CFV      Full           Yes      Yes                                 +---------+---------------+---------+-----------+----------+--------------+ SFJ      Full                                                         +---------+---------------+---------+-----------+----------+--------------+  FV Prox  Full                                                        +---------+---------------+---------+-----------+----------+--------------+ FV Mid   Full                                                        +---------+---------------+---------+-----------+----------+--------------+ FV DistalFull                                                        +---------+---------------+---------+-----------+----------+--------------+ PFV      Full                                                        +---------+---------------+---------+-----------+----------+--------------+ POP      Full           Yes      Yes                                 +---------+---------------+---------+-----------+----------+--------------+ PTV      Full                                                        +---------+---------------+---------+-----------+----------+--------------+ PERO     Full                                                        +---------+---------------+---------+-----------+----------+--------------+     Summary: RIGHT: - No evidence of common femoral vein obstruction.  LEFT: - There is no evidence of deep vein thrombosis in the lower extremity.  - No cystic structure found in the popliteal fossa.  *See table(s) above for measurements and observations.    Preliminary     Procedures Procedures (including critical care time)  Medications Ordered in ED Medications - No data to display  ED Course  I have reviewed the triage vital signs and the nursing notes.  Pertinent labs & imaging results that were available during my care of the patient were reviewed by me and considered in my medical decision making (see chart for details).  Clinical Course as of Oct 24 1402  Sun Oct 24, 2019  1353 86yo female brought in by family for left leg pain today, concerned for DVT vs adverse effects  from Cipro. On exam, patient is alert, at baseline per family, pleasant, denies any complaints. Normal ROM hips and knees, no back tenderness although states her  back hurts from sitting in the lobby.   XR left hip unremarkable. Venous doppler LLE normal.  Labs reassuring. Patient remains pain-free and without complaint.  Discussed possible radicular pain.  Will give prescription for Norco for pain not controlled with Lidoderm patches and Tylenol at home and recommend follow-up with PCP. Case discussed with Dr. Sabra Heck, ER attending, agrees with plan of care.   [LM]    Clinical Course User Index [LM] Roque Lias   MDM Rules/Calculators/A&P                          Final Clinical Impression(s) / ED Diagnoses Final diagnoses:  Left leg pain    Rx / DC Orders ED Discharge Orders         Ordered    HYDROcodone-acetaminophen (NORCO/VICODIN) 5-325 MG tablet  Every 4 hours PRN        10/24/19 1351           Roque Lias 10/24/19 1405    Noemi Chapel, MD 10/25/19 2012

## 2019-10-24 NOTE — Discharge Instructions (Addendum)
Work-up today is reassuring.  There is no blood clot in the leg and the x-ray does not show a fracture.  Labs are also normal.  Pain may be coming from the back.  Patient can take Tylenol and apply OTC lidocaine patches to her back as needed as directed for pain.  If this is not controlling pain, may give Norco for pain as needed as prescribed.  This medication can cause constipation give Colace or other stool softener as needed as directed. Recommend recheck with your doctor this week, return to ER for worsening or concerning symptoms.

## 2019-10-24 NOTE — ED Triage Notes (Signed)
Patient arrived from home by [facility] complaining of not being able to be still. States that this all started yesterday and states she is restless, also complains of left sided leg and hip pain, mild swelling noted to bilateral legs.

## 2019-11-01 ENCOUNTER — Other Ambulatory Visit: Payer: Self-pay | Admitting: Physician Assistant

## 2019-11-01 DIAGNOSIS — I10 Essential (primary) hypertension: Secondary | ICD-10-CM

## 2019-11-08 ENCOUNTER — Other Ambulatory Visit: Payer: Self-pay

## 2019-11-08 ENCOUNTER — Other Ambulatory Visit: Payer: Medicare Other

## 2019-11-08 DIAGNOSIS — I10 Essential (primary) hypertension: Secondary | ICD-10-CM | POA: Diagnosis not present

## 2019-11-08 DIAGNOSIS — E876 Hypokalemia: Secondary | ICD-10-CM | POA: Diagnosis not present

## 2019-11-08 DIAGNOSIS — Z961 Presence of intraocular lens: Secondary | ICD-10-CM | POA: Diagnosis not present

## 2019-11-08 LAB — BASIC METABOLIC PANEL WITH GFR
BUN: 12 mg/dL (ref 7–25)
CO2: 26 mmol/L (ref 20–32)
Calcium: 9.3 mg/dL (ref 8.6–10.4)
Chloride: 107 mmol/L (ref 98–110)
Creat: 0.83 mg/dL (ref 0.60–0.88)
GFR, Est African American: 70 mL/min/{1.73_m2} (ref >=60)
GFR, Est Non African American: 61 mL/min/{1.73_m2} (ref >=60)
Glucose, Bld: 83 mg/dL (ref 65–99)
Potassium: 3.6 mmol/L (ref 3.5–5.3)
Sodium: 143 mmol/L (ref 135–146)

## 2019-12-16 ENCOUNTER — Ambulatory Visit: Payer: Medicare Other | Admitting: Internal Medicine

## 2020-01-10 ENCOUNTER — Telehealth: Payer: Self-pay

## 2020-01-10 ENCOUNTER — Ambulatory Visit (INDEPENDENT_AMBULATORY_CARE_PROVIDER_SITE_OTHER): Payer: Medicare Other | Admitting: Family

## 2020-01-10 ENCOUNTER — Ambulatory Visit
Admission: RE | Admit: 2020-01-10 | Discharge: 2020-01-10 | Disposition: A | Discharge status: Home / Self Care | Payer: Medicare Other | Source: Ambulatory Visit | Attending: Family | Admitting: Family

## 2020-01-10 ENCOUNTER — Emergency Department (HOSPITAL_COMMUNITY)
Admission: EM | Admit: 2020-01-10 | Discharge: 2020-01-10 | Disposition: A | Discharge status: Home / Self Care | Payer: Medicare Other | Attending: Emergency Medicine | Admitting: Emergency Medicine

## 2020-01-10 ENCOUNTER — Other Ambulatory Visit: Payer: Self-pay

## 2020-01-10 ENCOUNTER — Encounter: Payer: Self-pay | Admitting: Family

## 2020-01-10 VITALS — BP 130/90 | HR 73 | Temp 98.0°F | Resp 16 | Ht 61.0 in | Wt 130.0 lb

## 2020-01-10 DIAGNOSIS — W19XXXD Unspecified fall, subsequent encounter: Secondary | ICD-10-CM | POA: Diagnosis not present

## 2020-01-10 DIAGNOSIS — M533 Sacrococcygeal disorders, not elsewhere classified: Secondary | ICD-10-CM | POA: Diagnosis not present

## 2020-01-10 DIAGNOSIS — S32502A Unspecified fracture of left pubis, initial encounter for closed fracture: Secondary | ICD-10-CM | POA: Diagnosis not present

## 2020-01-10 DIAGNOSIS — M25552 Pain in left hip: Secondary | ICD-10-CM | POA: Diagnosis not present

## 2020-01-10 DIAGNOSIS — Z79899 Other long term (current) drug therapy: Secondary | ICD-10-CM | POA: Diagnosis not present

## 2020-01-10 DIAGNOSIS — S32592D Other specified fracture of left pubis, subsequent encounter for fracture with routine healing: Secondary | ICD-10-CM | POA: Insufficient documentation

## 2020-01-10 DIAGNOSIS — S32592A Other specified fracture of left pubis, initial encounter for closed fracture: Secondary | ICD-10-CM | POA: Diagnosis not present

## 2020-01-10 DIAGNOSIS — N183 Chronic kidney disease, stage 3 unspecified: Secondary | ICD-10-CM | POA: Insufficient documentation

## 2020-01-10 DIAGNOSIS — Z792 Long term (current) use of antibiotics: Secondary | ICD-10-CM | POA: Insufficient documentation

## 2020-01-10 DIAGNOSIS — S32512D Fracture of superior rim of left pubis, subsequent encounter for fracture with routine healing: Secondary | ICD-10-CM | POA: Diagnosis not present

## 2020-01-10 DIAGNOSIS — N390 Urinary tract infection, site not specified: Secondary | ICD-10-CM | POA: Diagnosis not present

## 2020-01-10 DIAGNOSIS — M545 Low back pain, unspecified: Secondary | ICD-10-CM

## 2020-01-10 DIAGNOSIS — R399 Unspecified symptoms and signs involving the genitourinary system: Secondary | ICD-10-CM | POA: Diagnosis not present

## 2020-01-10 DIAGNOSIS — S32512A Fracture of superior rim of left pubis, initial encounter for closed fracture: Secondary | ICD-10-CM | POA: Diagnosis not present

## 2020-01-10 DIAGNOSIS — W19XXXA Unspecified fall, initial encounter: Secondary | ICD-10-CM

## 2020-01-10 DIAGNOSIS — S3210XA Unspecified fracture of sacrum, initial encounter for closed fracture: Secondary | ICD-10-CM

## 2020-01-10 DIAGNOSIS — I129 Hypertensive chronic kidney disease with stage 1 through stage 4 chronic kidney disease, or unspecified chronic kidney disease: Secondary | ICD-10-CM | POA: Insufficient documentation

## 2020-01-10 DIAGNOSIS — Y92009 Unspecified place in unspecified non-institutional (private) residence as the place of occurrence of the external cause: Secondary | ICD-10-CM

## 2020-01-10 DIAGNOSIS — S79911D Unspecified injury of right hip, subsequent encounter: Secondary | ICD-10-CM | POA: Diagnosis present

## 2020-01-10 LAB — POCT URINALYSIS DIPSTICK
Bilirubin, UA: POSITIVE
Blood, UA: POSITIVE
Glucose, UA: NEGATIVE
Ketones, UA: POSITIVE
Nitrite, UA: POSITIVE
Protein, UA: POSITIVE — AB
Spec Grav, UA: 1.025 (ref 1.010–1.025)
Urobilinogen, UA: 1 E.U./dL
pH, UA: 6 (ref 5.0–8.0)

## 2020-01-10 MED ORDER — BIOFREEZE 4 % EX GEL: 3.0000 [oz_av] | Freq: Three times a day (TID) | CUTANEOUS | 3 refills | Status: DC | PRN

## 2020-01-10 MED ORDER — HYDROCODONE-ACETAMINOPHEN 5-325 MG PO TABS
1.0000 | ORAL_TABLET | Freq: Once | ORAL | Status: AC
  Administered 2020-01-10: 1 via ORAL
  Filled 2020-01-10: qty 1

## 2020-01-10 MED ORDER — KETOROLAC TROMETHAMINE 30 MG/ML IJ SOLN
30.0000 mg | Freq: Once | INTRAMUSCULAR | Status: AC
  Administered 2020-01-10: 30 mg via INTRAMUSCULAR

## 2020-01-10 MED ORDER — ACETAMINOPHEN 500 MG PO TABS: 1000.0000 mg | ORAL_TABLET | Freq: Two times a day (BID) | ORAL | 0 refills | Status: DC

## 2020-01-10 MED ORDER — HYDROCODONE-ACETAMINOPHEN 5-325 MG PO TABS: 1.0000 | ORAL_TABLET | Freq: Four times a day (QID) | ORAL | 0 refills | Status: DC | PRN

## 2020-01-10 MED ORDER — CEPHALEXIN 500 MG PO CAPS: 500.0000 mg | ORAL_CAPSULE | Freq: Three times a day (TID) | ORAL | 0 refills | Status: DC

## 2020-01-10 NOTE — Patient Instructions (Addendum)
-   Please get left hip and lower back X-ray done at [facility] over [address].Then will call you with results.  -  Take Extra Strength Tylenol 1000 mg tablet one by mouth twice daily and 500 mg tablet daily at 2 Pm for left hip and lower back pain.

## 2020-01-10 NOTE — Telephone Encounter (Signed)
I really recommend for her to be seen today due to the fractures.

## 2020-01-10 NOTE — ED Notes (Signed)
Daughter stated, my mother is in so much pain. Last pain shot is 200pm. She needs something for pain.

## 2020-01-10 NOTE — Discharge Instructions (Addendum)
Please take vicodin as needed for pain.  Ambulate as tolerated as you have a broken pelvis bone.  Take antibiotic as prescribed for your urinary tract infection.  Follow up with orthopedist as needed.

## 2020-01-10 NOTE — ED Triage Notes (Signed)
Reported was sent by OP imaging d/t left hip fracture; s/p fall 2 weeks ago.

## 2020-01-10 NOTE — Progress Notes (Signed)
Provider: Marlowe Sax FNP-C  Anaira Seay, Nelda Bucks, NP  Patient Care Team: Chalsea Darko, Nelda Bucks, NP as PCP - General (Family Medicine) Magnus Sinning, MD as Consulting Physician (Physical Medicine and Rehabilitation) Bo Merino, MD as Consulting Physician (Rheumatology) Irene Shipper, MD as Consulting Physician (Gastroenterology) Webb Laws, [place] as Referring Physician (Optometry) Silvestre Gunner, Alcide Goodness (Inactive) (Dermatology)  Extended Emergency Contact Information Primary Emergency Contact: Hessie Dibble of [place] Phone: [phone] Mobile Phone: [phone] Relation: Daughter Secondary Emergency Contact: Sweeney,Cheryl Address: [place], [place] Phone: [phone] Mobile Phone: [phone] Relation: Daughter  Code Status:  Full Code  Goals of care: Advanced Directive information Advanced Directives 01/10/2020  Does Patient Have a Medical Advance Directive? Yes  Type of Advance Directive Living will;Healthcare Power of Attorney  Does patient want to make changes to medical advance directive? No - Patient declined  Copy of [place] in Chart? Yes - validated most recent copy scanned in chart (See row information)  Would patient like information on creating a medical advance directive? -     Chief Complaint  Patient presents with   Acute Visit    Complains of falling a couple of weeks ago. Recently having difficulty walking.   Concern    Moderate Fall Risk.    HPI:  Pt is a 86 y.o. female seen today for an acute visit for evaluation of left hip,back pain post fall one week ago.POA states patient had unwitnessed fall out of the bed. She did not hit her head and had no bruises.she was not evaluated in the ED.she was able to get around with her walker but 4 days ago pain on her left hip worsen requiring two [erson assistance with transfer. She complains of  pain on left hip,lower back and bilateral groin area.Unable to bear weight on left leg.  She denies any fever,chills or upper respiratory infection symptoms.    Past Medical History:  Diagnosis Date   Abnormal involuntary movements(781.0)    Anemia    Anxiety state, unspecified    Bilateral swelling of feet    Blood transfusion    Cardiac dysrhythmia, unspecified    Chronic constipation    Dementia (HCC)    DJD (degenerative joint disease)    Esophageal stricture    Generalized osteoarthrosis, unspecified site    GERD (gastroesophageal reflux disease)    Hearing loss    Hiatal hernia    Hypertension    Macular hole of left eye    Osteoporosis    Pelvic adhesions    Postgastrectomy syndrome    Pure hypercholesterolemia    Tortuous colon    Tremor    Urinary tract infection, site not specified    Venous insufficiency    Vitamin B12 deficiency    Past Surgical History:  Procedure Laterality Date   CHOLECYSTECTOMY     COLONOSCOPY     ESOPHAGUS SURGERY  2003   mole biopsy- left neck area Left    NISSEN FUNDOPLICATION     TOTAL ABDOMINAL HYSTERECTOMY     DUB,    UPPER GASTROINTESTINAL ENDOSCOPY      No Known Allergies  Outpatient Encounter Medications as of 01/10/2020  Medication Sig   CARTIA XT 180 MG 24 hr capsule TAKE 1 CAPSULE BY MOUTH  DAILY   ciprofloxacin (CIPRO) 500 MG tablet Take 1 tablet (500 mg total) by mouth 2 (two) times daily.  diphenhydrAMINE (BENADRYL) 2 % cream Apply 1 application topically daily as needed for itching.   melatonin 5 MG TABS Take 5 mg by mouth at bedtime.   Multiple Vitamins-Minerals (OCUVITE PO) Take 1 capsule by mouth daily.   OMEPRAZOLE PO Take 20 mg by mouth daily.    Phenazopyridine HCl (AZO DINE PO) Take 1 capsule by mouth daily.   pravastatin (PRAVACHOL) 40 MG tablet Take 1 tablet (40 mg total) by mouth every evening.   traZODone (DESYREL) 50 MG tablet Take 0.5-1 tablets (25-50 mg  total) by mouth at bedtime as needed for sleep.   [DISCONTINUED] diltiazem (DILACOR XR) 180 MG 24 hr capsule Take 1 capsule (180 mg total) by mouth daily.   [DISCONTINUED] HYDROcodone-acetaminophen (NORCO/VICODIN) 5-325 MG tablet Take 1 tablet by mouth every 4 (four) hours as needed.   [DISCONTINUED] saccharomyces boulardii (FLORASTOR) 250 MG capsule Take 1 capsule (250 mg total) by mouth 2 (two) times daily.   No facility-administered encounter medications on file as of 01/10/2020.    Review of Systems  Constitutional: Negative for appetite change, chills and fatigue.  Respiratory: Negative for cough, chest tightness, shortness of breath and wheezing.   Cardiovascular: Negative for chest pain, palpitations and leg swelling.  Gastrointestinal: Negative for abdominal distention, abdominal pain, diarrhea, nausea and vomiting.  Musculoskeletal: Positive for arthralgias, back pain and gait problem. Negative for joint swelling and myalgias.  Skin: Negative for color change and pallor.  Neurological: Negative for dizziness, speech difficulty, light-headedness, numbness and headaches.  Psychiatric/Behavioral: Negative for agitation and confusion.    Immunization History  Administered Date(s) Administered   Fluad Quad(high Dose 65+) 12/16/2018   Influenza Split 12/04/2010, 11/12/2011   Influenza Whole 11/13/2006, 11/12/2007, 11/17/2008, 11/15/2009   Influenza, High Dose Seasonal PF 11/07/2014, 11/29/2019   Influenza,inj,Quad PF,6+ Mos 11/11/2012   Influenza-Unspecified 11/18/2013, 12/11/2015, 11/07/2016, 12/10/2017   Pneumococcal Conjugate-13 05/03/2014   Pneumococcal Polysaccharide-23 12/10/2002, 04/19/2014   Tdap 03/13/2016   Pertinent  Health Maintenance Due  Topic Date Due   INFLUENZA VACCINE  Completed   DEXA SCAN  Completed   PNA vac Low Risk Adult  Completed   Fall Risk  01/10/2020 10/15/2019 06/17/2018 10/03/2016 09/04/2016  Falls in the past year? 1 0 0 No No    Number falls in past yr: 0 0 - - -  Injury with Fall? 1 0 - - -  Risk Factor Category  - - - - -  Risk for fall due to : - - - - -  Follow up - - Falls evaluation completed - -   Functional Status Survey:    Vitals:   01/10/20 1320  BP: 130/90  Pulse: 73  Resp: 16  Temp: 98 F (36.7 C)  SpO2: 97%  Weight: 130 lb (59 kg)  Height: 5\' 1"  (1.549 m)   Body mass index is 24.56 kg/m. Physical Exam Vitals reviewed.  Constitutional:      General: She is not in acute distress.    Appearance: She is not ill-appearing.  HENT:     Head: Normocephalic.     Nose: Nose normal. No congestion or rhinorrhea.     Mouth/Throat:     Mouth: Mucous membranes are moist.     Pharynx: Oropharynx is clear. No oropharyngeal exudate or posterior oropharyngeal erythema.  Eyes:     General: No scleral icterus.       Right eye: No discharge.        Left eye: No discharge.     Conjunctiva/sclera:  Conjunctivae normal.     Pupils: Pupils are equal, round, and reactive to light.  Cardiovascular:     Rate and Rhythm: Normal rate and regular rhythm.     Pulses: Normal pulses.     Heart sounds: Normal heart sounds. No murmur heard.  No friction rub. No gallop.   Pulmonary:     Effort: Pulmonary effort is normal. No respiratory distress.     Breath sounds: Normal breath sounds. No wheezing, rhonchi or rales.  Chest:     Chest wall: No tenderness.  Abdominal:     General: Bowel sounds are normal. There is no distension.     Palpations: Abdomen is soft. There is no mass.     Tenderness: There is abdominal tenderness. There is no right CVA tenderness, left CVA tenderness, guarding or rebound.     Hernia: No hernia is present.     Comments: Bilateral groin tenderness   Musculoskeletal:     Cervical back: Normal range of motion. No rigidity or tenderness.     Lumbar back: Tenderness present. No swelling or edema. Normal range of motion. Negative right straight leg raise test and negative left straight  leg raise test.     Right hip: Normal.     Left hip: Tenderness present. No bony tenderness or crepitus. Normal range of motion. Normal strength.     Right lower leg: No edema.     Left lower leg: No edema.  Lymphadenopathy:     Cervical: No cervical adenopathy.  Skin:    General: Skin is warm and dry.     Findings: No bruising, erythema or rash.  Neurological:     Mental Status: She is alert. Mental status is at baseline.     Cranial Nerves: No cranial nerve deficit.     Motor: No weakness.     Coordination: Coordination normal.     Gait: Gait abnormal.  Psychiatric:        Mood and Affect: Mood is anxious.        Speech: Speech normal.     Comments: In pain      Labs reviewed: Recent Labs    10/15/19 1522 10/24/19 0738 11/08/19 0844  NA 138 140 143  K 3.4* 3.6 3.6  CL 104 107 107  CO2 26 22 26   GLUCOSE 110 107* 83  BUN 13 10 12   CREATININE 1.04* 0.94 0.83  CALCIUM 9.0 9.4 9.3   Recent Labs    07/15/19 0853 10/15/19 1522  AST 18 19  ALT 10 11  ALKPHOS 89  --   BILITOT 0.9 1.1  PROT 6.5 6.4  ALBUMIN 4.2  --    Recent Labs    07/15/19 0853 10/15/19 1522 10/24/19 0738  WBC 5.7 6.5 7.2  NEUTROABS 3.2 3,510  --   HGB 12.0 12.2 12.4  HCT 35.8 36.9 38.2  MCV 92 92.5 92.9  PLT 243 278 271   Lab Results  Component Value Date   TSH 1.48 10/15/2019   Lab Results  Component Value Date   HGBA1C 5.3 10/08/2018   Lab Results  Component Value Date   CHOL 115 07/15/2019   HDL 42 07/15/2019   LDLCALC 55 07/15/2019   LDLDIRECT 157.8 05/18/2010   TRIG 91 07/15/2019   CHOLHDL 2.7 07/15/2019    Significant Diagnostic Results in last 30 days:  No results found.  Assessment/Plan 1. Acute pain of left hip Status post fall one week ago but pain has worsen 4 days ago  unable to bear weight on left leg. - Advised to take Extra Strength Tylenol as below. - apply Biofreeze three times daily.  - Please get left hip and lower back X-ray done at [facility] over [address].Then will call you with results.Address provided to POA on AVS.  - DG Hip Unilat W OR W/O Pelvis 2-3 Views Left; Future - acetaminophen (TYLENOL) 500 MG tablet; Take 2 tablets (1,000 mg total) by mouth in the morning and at bedtime. And 500 mg tablet daily at 2 pm  Dispense: 30 tablet; Refill: 0 - Menthol, Topical Analgesic, (BIOFREEZE) 4 % GEL; Apply 3 oz topically 3 (three) times daily as needed.  Dispense: 74 mL; Refill: 3 - ketorolac (TORADOL) 30 MG/ML injection 30 mg  2. Acute bilateral low back pain without sciatica Status post fall one week ago observed by POA on her bottom.she was not evaluated in ED.No loss of bladder or bowel control.  - Extra strength Tylenol and Biofreeze as below.  - DG Lumbar Spine Complete; Future - acetaminophen (TYLENOL) 500 MG tablet; Take 2 tablets (1,000 mg total) by mouth in the morning and at bedtime. And 500 mg tablet daily at 2 pm  Dispense: 30 tablet; Refill: 0 - Menthol, Topical Analgesic, (BIOFREEZE) 4 % GEL; Apply 3 oz topically 3 (three) times daily as needed.  Dispense: 74 mL; Refill: 3 - POC Urinalysis Dipstick - ketorolac (TORADOL) 30 MG/ML injection 30 mg  3. Fall at home, initial encounter [facility] one week ago was not evaluated in urgent care or ED.No loss of consciousness or hitting head reported. Left hip and lower back pain worsen 4 days ago.  - DG Hip Unilat W OR W/O Pelvis 2-3 Views Left; Future - DG Lumbar Spine Complete; Future  Family/ staff Communication: Reviewed plan of care with patient and Daughter.   Labs/tests ordered:  - DG Hip Unilat W OR W/O Pelvis 2-3 Views Left; Future - DG Lumbar Spine Complete; Future - POC Urinalysis Dipstick  Next Appointment: Has appointment with Dr.Reed.  Sandrea Hughs, NP

## 2020-01-10 NOTE — ED Provider Notes (Signed)
[facility] EMERGENCY DEPARTMENT Provider Note   CSN: [id number] Arrival date & time: 01/10/20  1635     History Chief Complaint  Patient presents with  . Hip Pain    Left hip fracture    [name] is a 86 y.o. female.  The history is provided by the patient and a relative. No language interpreter was used.  Hip Pain     86 year old female significant history of dementia, DJD, anemia, CKD, recently diagnosed with hip fracture presents ED for evaluation of hip pain.  Patient is hard of hearing, history obtained through daughter who is at bedside.  Per daughter, patient may have fallen between her bed and her chair approximately 2 weeks ago.  However she did not complain much about the fall.  She has been using a walker to move about however within the past 3 days she has been complaining of pain to her left hip and lower back region and having more difficulty moving about.  They believe she may have another fall.  No report of any fever chills no cough no trouble breathing no change in her diet no change in medication and no urinary complaint.  She does live at home by herself but daughter visit her regularly.  She has not been vaccinated for COVID-19.  She is not on any blood thinner medication.  She was seen at her PCP office today and was told that her UA shows signs of urinary tract infection.  She was sent to an imaging center to get x-ray of her hip and the PCP called family member to bring patient to the ER for further evaluation as she has some evidence of broken bones.   Past Medical History:  Diagnosis Date  . Abnormal involuntary movements(781.0)   . Anemia   . Anxiety state, unspecified   . Bilateral swelling of feet   . Blood transfusion   . Cardiac dysrhythmia, unspecified   . Chronic constipation   . Dementia ([place])   . DJD (degenerative joint disease)   . Esophageal stricture   . Generalized osteoarthrosis, unspecified site   . GERD  (gastroesophageal reflux disease)   . Hearing loss   . Hiatal hernia   . Hypertension   . Macular hole of left eye   . Osteoporosis   . Pelvic adhesions   . Postgastrectomy syndrome   . Pure hypercholesterolemia   . Tortuous colon   . Tremor   . Urinary tract infection, site not specified   . Venous insufficiency   . Vitamin B12 deficiency     Patient Active Problem List   Diagnosis Date Noted  . UTI (urinary tract infection) 10/08/2018  . Encounter for Medicare annual wellness exam 06/17/2018  . Abdominal pain, acute, left lower quadrant 09/01/2017  . Other chest pain 09/01/2017  . CKD (chronic kidney disease), symptom management only, stage 3 (moderate) ([place]) 07/03/2017  . Cognitive changes 03/05/2017  . Healthcare maintenance 03/05/2017  . Hypertension 03/05/2017  . Cervicalgia 10/10/2016  . Lower extremity edema 10/03/2016  . Dyspnea on exertion 10/03/2016  . Right arm pain 09/04/2016  . Migraine 09/04/2016  . Numerous moles 09/04/2016  . Frequent headaches 09/04/2016  . Chronic insomnia 05/11/2015  . Vitamin D deficiency 05/17/2014  . Counseling regarding end of life decision making 05/03/2014  . Moderate dementia ([place]) 05/03/2014  . Lumbago 05/11/2012  . Vitamin B 12 deficiency 05/11/2012  . Dysphagia, pharyngoesophageal phase 12/17/2010  . Esophageal reflux 12/17/2010  .  Abdominal hernia 12/17/2010  . Anemia, iron deficiency 12/06/2010  . HYPERCHOLESTEROLEMIA, MILD 11/19/2008  . Venous (peripheral) insufficiency 11/14/2007  . Essential hypertension, benign 05/14/2007  . Arrhythmia 05/14/2007  . CONSTIPATION, CHRONIC 05/14/2007  . Osteoporosis 05/14/2007  . Benign essential tremor 05/14/2007  . HIATAL HERNIA 12/18/2006  . DEGENERATIVE JOINT DISEASE, GENERALIZED 12/18/2006    Past Surgical History:  Procedure Laterality Date  . CHOLECYSTECTOMY    . COLONOSCOPY    . ESOPHAGUS SURGERY  2003  . mole biopsy- left neck area Left   . NISSEN FUNDOPLICATION      . TOTAL ABDOMINAL HYSTERECTOMY     DUB,   . UPPER GASTROINTESTINAL ENDOSCOPY       OB History   No obstetric history on file.     Family History  Problem Relation Age of Onset  . Cancer Mother 74       unknown type, most likely colon  . Heart disease Brother 9  . Stroke Sister 110  . High blood pressure Son 72  . Graves' disease Daughter   . High blood pressure Daughter 69  . High blood pressure Daughter 64  . Irritable bowel syndrome Daughter   . Heart failure Daughter 77    Social History   Tobacco Use  . Smoking status: Never Smoker  . Smokeless tobacco: Never Used  Substance Use Topics  . Alcohol use: Never    Alcohol/week: 0.0 standard drinks  . Drug use: Never    Home Medications Prior to Admission medications   Medication Sig Start Date End Date Taking? Authorizing Provider  acetaminophen (TYLENOL) 500 MG tablet Take 2 tablets (1,000 mg total) by mouth in the morning and at bedtime. And 500 mg tablet daily at 2 pm 01/10/20   Ngetich, Dinah C, NP  CARTIA XT 180 MG 24 hr capsule TAKE 1 CAPSULE BY MOUTH  DAILY 11/01/19   Ngetich, Dinah C, NP  ciprofloxacin (CIPRO) 500 MG tablet Take 1 tablet (500 mg total) by mouth 2 (two) times daily. 10/21/19   Ngetich, Dinah C, NP  diphenhydrAMINE (BENADRYL) 2 % cream Apply 1 application topically daily as needed for itching.    [provider]  melatonin 5 MG TABS Take 5 mg by mouth at bedtime.    [provider]  Menthol, Topical Analgesic, (BIOFREEZE) 4 % GEL Apply 3 oz topically 3 (three) times daily as needed. 01/10/20   Ngetich, Dinah C, NP  Multiple Vitamins-Minerals (OCUVITE PO) Take 1 capsule by mouth daily.    [provider]  OMEPRAZOLE PO Take 20 mg by mouth daily.     [provider]  Phenazopyridine HCl (AZO DINE PO) Take 1 capsule by mouth daily.    [provider]  pravastatin (PRAVACHOL) 40 MG tablet Take 1 tablet (40 mg total) by mouth every evening. 10/15/19   Ngetich,  Dinah C, NP  traZODone (DESYREL) 50 MG tablet Take 0.5-1 tablets (25-50 mg total) by mouth at bedtime as needed for sleep. 10/15/19   Ngetich, Dinah C, NP  diltiazem (DILACOR XR) 180 MG 24 hr capsule Take 1 capsule (180 mg total) by mouth daily. 12/04/10 12/30/11  Noralee Space, MD    Allergies    Patient has no known allergies.  Review of Systems   Review of Systems  All other systems reviewed and are negative.   Physical Exam Updated Vital Signs BP (!) 149/70   Pulse 73   Temp 98 F (36.7 C) (Oral)   Resp 18  Ht 5\' 1"  (1.549 m)   Wt 59 kg   SpO2 95%   BMI 24.56 kg/m   Physical Exam Vitals and nursing note reviewed.  Constitutional:      General: She is not in acute distress.    Appearance: She is well-developed.     Comments: Patient resting comfortably in no acute discomfort.  Patient is hard of hearing.  HENT:     Head: Atraumatic.  Eyes:     Conjunctiva/sclera: Conjunctivae normal.  Cardiovascular:     Rate and Rhythm: Normal rate and regular rhythm.     Pulses: Normal pulses.     Heart sounds: Normal heart sounds.  Pulmonary:     Effort: Pulmonary effort is normal.     Breath sounds: Normal breath sounds.  Abdominal:     Palpations: Abdomen is soft.     Tenderness: There is no abdominal tenderness.  Musculoskeletal:        General: Tenderness (Mild tenderness to left hip and pelvic on palpation without any apparent skin changes.  No significant midline spine tenderness.  No bruising noted.) present.     Cervical back: Neck supple.  Skin:    Findings: No rash.  Neurological:     Mental Status: She is alert. Mental status is at baseline.     ED Results / Procedures / Treatments   Labs (all labs ordered are listed, but only abnormal results are displayed) Labs Reviewed - No data to display  EKG None  Radiology DG Lumbar Spine Complete  Result Date: 01/10/2020 CLINICAL DATA:  Low back pain.  Fall 2 weeks ago. EXAM: LUMBAR SPINE - COMPLETE 4+ VIEW  COMPARISON:  MRI lumbar spine dated January 18, 2010. FINDINGS: Five lumbar type vertebral bodies. Age-indeterminate mild to moderate L3 superior endplate compression fracture, new since 2011. Remaining vertebral body heights are preserved. Unchanged scoliosis and mild anterolisthesis at L5-S1. Diffuse disc height loss throughout the lumbar spine, moderate at L2-L3 and L4-L5. Left pubic rami and suspected sacral ala fractures as detailed on dedicated left hip x-rays. IMPRESSION: 1. Age-indeterminate mild to moderate L3 superior endplate compression fracture, new since 2011. Correlate with point tenderness. 2. Left pubic rami and suspected sacral ala fractures as detailed on dedicated left hip x-rays. Electronically Signed   By: Titus Dubin M.D.   On: 01/10/2020 16:04   DG Hip Unilat W OR W/O Pelvis 2-3 Views Left  Result Date: 01/10/2020 CLINICAL DATA:  Left hip and buttock pain.  Fall 2 weeks ago. EXAM: DG HIP (WITH OR WITHOUT PELVIS) 2-3V LEFT COMPARISON:  Left hip x-rays dated October 24, 2019. FINDINGS: New acute fractures of the left superior and inferior pubic rami. No femur fracture. New lucency through the cortical margin of the left sacrum along the sacroiliac joint. No dislocation. The hip joint spaces are preserved. Osteopenia. Soft tissues are unremarkable. IMPRESSION: 1. New acute fractures of the left superior and inferior pubic rami. 2. Suspected left sacral ala fracture. These results will be called to the ordering clinician or representative by the Radiologist Assistant, and communication documented in the PACS or [employer]. Electronically Signed   By: Titus Dubin M.D.   On: 01/10/2020 15:59    Procedures Procedures (including critical care time)  Medications Ordered in ED Medications  HYDROcodone-acetaminophen (NORCO/VICODIN) 5-325 MG per tablet 1 tablet (has no administration in time range)    ED Course  I have reviewed the triage vital signs and the nursing  notes.  Pertinent labs & imaging  results that were available during my care of the patient were reviewed by me and considered in my medical decision making (see chart for details).    MDM Rules/Calculators/A&P                          BP (!) 149/70   Pulse 73   Temp 98 F (36.7 C) (Oral)   Resp 18   Ht 5\' 1"  (1.549 m)   Wt 59 kg   SpO2 95%   BMI 24.56 kg/m   Final Clinical Impression(s) / ED Diagnoses Final diagnoses:  Fracture of multiple pubic rami, left, closed, initial encounter ([place])  Closed fracture of sacrum, unspecified portion of sacrum, initial encounter ([place])  Lower urinary tract infectious disease    Rx / DC Orders ED Discharge Orders         Ordered    HYDROcodone-acetaminophen (NORCO/VICODIN) 5-325 MG tablet  Every 6 hours PRN        01/10/20 1918    cephALEXin (KEFLEX) 500 MG capsule  3 times daily        01/10/20 1918         7:04 PM Patient with suspected fall several days prior causing hip and low back pain.  She normally use a walker to walk but have not been moving about much within the past few days secondary to pain.  X-ray obtained today demonstrates an age-indeterminate mild to moderate L3 superior endplate compression fracture which is new since 2011.  However she does not have any point tenderness on palpation at the affected area.  There is evidence of a left pubic rami and suspected sacral ala fracture.  She does have some mild tenderness there.  She did receive a Percocet prior to my evaluation. I discussed care with Dr. Karle Starch who recommend consulting ortho  7:10 PM I did discuss finding with orthopedist, Dr. Griffin Basil, who felt that injury can be weightbearing as tolerated as she does not need surgical intervention.  I discussed this plan with patient and with daughter who felt comfortable having her return back home with pain medication as well as antibiotic for her report UTI.  Orthopedic referral given for outpatient follow-up.  Return  precaution discussed.  Patient is anxious to go home.   Domenic Moras, PA-C 01/10/20 1929    Truddie Hidden, MD 01/10/20 [phone]

## 2020-01-10 NOTE — Telephone Encounter (Signed)
Patient daughter "Renita Papa" states that she's at the ED with her mother and states that it's gonna take 7hrs to be seen. She states that her mother cant sit there in pain for this long. She states can she just take her home and have a orthopedic look at her tomorrow. I advised her that she stay in the ED since they'll be able to give her medicine as needed. Instead of returning home with her mother in pain and no treatment. Daughter Wonda Amis insisted that I still send message to provider Ngetich, Nelda Bucks, NP. Message routed to PCP Ngetich, Nelda Bucks, NP . Please Advise.

## 2020-01-11 NOTE — Telephone Encounter (Signed)
Called patient daughter Wonda Amis" at 4:58pm and stated this. Daughter stated that she will stay and wait for treatment.

## 2020-01-11 NOTE — Telephone Encounter (Signed)
Thank You.

## 2020-01-12 ENCOUNTER — Telehealth: Payer: Self-pay | Admitting: *Deleted

## 2020-01-12 DIAGNOSIS — Z20822 Contact with and (suspected) exposure to covid-19: Secondary | ICD-10-CM | POA: Diagnosis not present

## 2020-01-12 NOTE — Telephone Encounter (Signed)
I recommend a skilled [facility] where she can work with Physical Therapy and Occupation therapy.Does [name] have a Rehab or Assisted Living ?
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
## 2020-01-12 NOTE — Telephone Encounter (Signed)
LMOM with Dinah's response.

## 2020-01-12 NOTE — Telephone Encounter (Signed)
Received FL2 Form from [facility] [phone] Pattie Mastin.   Filled out and placed in Dinah's folder to review, fill out and sign. To be faxed back to [facility] Fax: [phone] once completed.

## 2020-01-13 LAB — URINE CULTURE
MICRO NUMBER:: 11233816
SPECIMEN QUALITY:: ADEQUATE

## 2020-01-17 ENCOUNTER — Telehealth: Payer: Self-pay

## 2020-01-17 MED ORDER — SACCHAROMYCES BOULARDII 250 MG PO CAPS: 250.0000 mg | ORAL_CAPSULE | Freq: Two times a day (BID) | ORAL | 0 refills | Status: AC

## 2020-01-17 MED ORDER — NITROFURANTOIN MONOHYD MACRO 100 MG PO CAPS: 100.0000 mg | ORAL_CAPSULE | Freq: Two times a day (BID) | ORAL | 0 refills | Status: AC

## 2020-01-17 NOTE — Telephone Encounter (Signed)
-----   Message from Sandrea Hughs, NP sent at 01/17/2020  9:26 AM EST ----- Urine final culture positive for > 100,000 Staphylococcus not sensitivity to prescribed antibiotics from ED ( Keflex).discontinue Keflex then start on Nitrofurantoin 100 mg capsule one by mouth twice daily x 7 days.Take along with Probiotics Florastor 250 mg capsule one by mouth twice daily x 10 days.

## 2020-01-17 NOTE — Telephone Encounter (Signed)
Discussed results with patients daughter, patients daughter verbalized understanding of results.  RX's sent to pharmacy and Keflex was discontinued off medication list

## 2020-01-17 NOTE — Telephone Encounter (Signed)
Patient's daughter states she is really struggling with trying to care for patient at home. She states over Thanksgiving there was family to help her, but everyone has left. She states [facility] is supposed to start services next Tuesday, as they do not currently have available staff. I do not see that referral has been made for [facility] services. They state an FL2 for [facility] has been done (pending your signature) but that is not something that will happen soon.   I called [facility] and they do not have a record of the patient. Previously a referral had been made to [facility], but daughter prefers [facility].   FL2 was faxed to [facility] today.   Called daughter to let her know about FL2 and to ask about [facility].

## 2020-01-18 DIAGNOSIS — S32592A Other specified fracture of left pubis, initial encounter for closed fracture: Secondary | ICD-10-CM | POA: Diagnosis not present

## 2020-01-27 ENCOUNTER — Other Ambulatory Visit: Payer: Self-pay | Admitting: Adult Health

## 2020-01-27 DIAGNOSIS — E782 Mixed hyperlipidemia: Secondary | ICD-10-CM

## 2020-01-28 ENCOUNTER — Telehealth: Payer: Self-pay | Admitting: *Deleted

## 2020-01-28 NOTE — Telephone Encounter (Signed)
Daughter, Malachy Mood, called and stated that patient fell back in November and was seen 2 weeks later and then sent to the ER and then released with pain medication.   Daughter's could not handle patient and admitted her to [facility]. Daughter stated that [facility] is not taking care of patient. Stated that patient has to put herself in bed with no help and the daughters stated that when she was at home they had to help her get into bed due to so much pain.  Patient is still in a lot of pain and the daughter's do not know what to do or where to go from here.   Please Advise.

## 2020-01-28 NOTE — Telephone Encounter (Signed)
Patient daughter notified and agreed and will start looking into a different SNF and will let us know.

## 2020-01-28 NOTE — Telephone Encounter (Signed)
Ngetich, Kathleen C, NP  You 1 hour ago (1:23 PM)     She [name] need a higher level of care if unable to transfer herself.Recommend transfer to [place] facility.Will fill out the [facility] 2 once you identify a facility to transfer.

## 2020-01-31 DIAGNOSIS — L89151 Pressure ulcer of sacral region, stage 1: Secondary | ICD-10-CM | POA: Diagnosis not present

## 2020-01-31 DIAGNOSIS — S32502D Unspecified fracture of left pubis, subsequent encounter for fracture with routine healing: Secondary | ICD-10-CM | POA: Diagnosis not present

## 2020-01-31 DIAGNOSIS — N189 Chronic kidney disease, unspecified: Secondary | ICD-10-CM | POA: Diagnosis not present

## 2020-01-31 DIAGNOSIS — I129 Hypertensive chronic kidney disease with stage 1 through stage 4 chronic kidney disease, or unspecified chronic kidney disease: Secondary | ICD-10-CM | POA: Diagnosis not present

## 2020-01-31 DIAGNOSIS — Z9181 History of falling: Secondary | ICD-10-CM | POA: Diagnosis not present

## 2020-01-31 DIAGNOSIS — H9190 Unspecified hearing loss, unspecified ear: Secondary | ICD-10-CM | POA: Diagnosis not present

## 2020-01-31 DIAGNOSIS — K219 Gastro-esophageal reflux disease without esophagitis: Secondary | ICD-10-CM | POA: Diagnosis not present

## 2020-02-01 DIAGNOSIS — S32502D Unspecified fracture of left pubis, subsequent encounter for fracture with routine healing: Secondary | ICD-10-CM | POA: Diagnosis not present

## 2020-02-01 DIAGNOSIS — K219 Gastro-esophageal reflux disease without esophagitis: Secondary | ICD-10-CM | POA: Diagnosis not present

## 2020-02-01 DIAGNOSIS — I129 Hypertensive chronic kidney disease with stage 1 through stage 4 chronic kidney disease, or unspecified chronic kidney disease: Secondary | ICD-10-CM | POA: Diagnosis not present

## 2020-02-01 DIAGNOSIS — E782 Mixed hyperlipidemia: Secondary | ICD-10-CM | POA: Diagnosis not present

## 2020-02-01 DIAGNOSIS — M159 Polyosteoarthritis, unspecified: Secondary | ICD-10-CM | POA: Diagnosis not present

## 2020-02-01 DIAGNOSIS — N189 Chronic kidney disease, unspecified: Secondary | ICD-10-CM | POA: Diagnosis not present

## 2020-02-01 DIAGNOSIS — G4700 Insomnia, unspecified: Secondary | ICD-10-CM | POA: Diagnosis not present

## 2020-02-01 DIAGNOSIS — L89151 Pressure ulcer of sacral region, stage 1: Secondary | ICD-10-CM | POA: Diagnosis not present

## 2020-02-01 DIAGNOSIS — Z9181 History of falling: Secondary | ICD-10-CM | POA: Diagnosis not present

## 2020-02-01 DIAGNOSIS — H9190 Unspecified hearing loss, unspecified ear: Secondary | ICD-10-CM | POA: Diagnosis not present

## 2020-02-01 DIAGNOSIS — I10 Essential (primary) hypertension: Secondary | ICD-10-CM | POA: Diagnosis not present

## 2020-02-02 ENCOUNTER — Telehealth: Payer: Self-pay

## 2020-02-02 NOTE — Telephone Encounter (Signed)
Patient POA/Daughter "Renita Papa " states that nobody at the [facility] facility notified her about these concerns. She states that she can't bring her mother and she will call back after she speaks to the facility.

## 2020-02-02 NOTE — Telephone Encounter (Signed)
Noted. Waiting for daughter to call back.

## 2020-02-02 NOTE — Telephone Encounter (Signed)
If unable to bring patient.Mychart Video visit will be okay so that I can visualize the open area on the coccyx and the rash.

## 2020-02-02 NOTE — Telephone Encounter (Signed)
-----   Message from Sandrea Hughs, NP sent at 02/02/2020  1:44 PM EST ----- Regarding: scheduel for a visit Please call patient's POA to schedule for a visit.[facility] facility reported patient's has a open area on coccyx and a rash on left upper side of back. Thanks  [employer]

## 2020-02-03 DIAGNOSIS — N189 Chronic kidney disease, unspecified: Secondary | ICD-10-CM | POA: Diagnosis not present

## 2020-02-03 DIAGNOSIS — H9190 Unspecified hearing loss, unspecified ear: Secondary | ICD-10-CM | POA: Diagnosis not present

## 2020-02-03 DIAGNOSIS — L89151 Pressure ulcer of sacral region, stage 1: Secondary | ICD-10-CM | POA: Diagnosis not present

## 2020-02-03 DIAGNOSIS — S32502D Unspecified fracture of left pubis, subsequent encounter for fracture with routine healing: Secondary | ICD-10-CM | POA: Diagnosis not present

## 2020-02-03 DIAGNOSIS — I129 Hypertensive chronic kidney disease with stage 1 through stage 4 chronic kidney disease, or unspecified chronic kidney disease: Secondary | ICD-10-CM | POA: Diagnosis not present

## 2020-02-03 DIAGNOSIS — K219 Gastro-esophageal reflux disease without esophagitis: Secondary | ICD-10-CM | POA: Diagnosis not present

## 2020-02-03 DIAGNOSIS — Z9181 History of falling: Secondary | ICD-10-CM | POA: Diagnosis not present

## 2020-02-04 DIAGNOSIS — Z9181 History of falling: Secondary | ICD-10-CM | POA: Diagnosis not present

## 2020-02-04 DIAGNOSIS — I129 Hypertensive chronic kidney disease with stage 1 through stage 4 chronic kidney disease, or unspecified chronic kidney disease: Secondary | ICD-10-CM | POA: Diagnosis not present

## 2020-02-04 DIAGNOSIS — N189 Chronic kidney disease, unspecified: Secondary | ICD-10-CM | POA: Diagnosis not present

## 2020-02-04 DIAGNOSIS — S32502D Unspecified fracture of left pubis, subsequent encounter for fracture with routine healing: Secondary | ICD-10-CM | POA: Diagnosis not present

## 2020-02-04 DIAGNOSIS — K219 Gastro-esophageal reflux disease without esophagitis: Secondary | ICD-10-CM | POA: Diagnosis not present

## 2020-02-04 DIAGNOSIS — L89151 Pressure ulcer of sacral region, stage 1: Secondary | ICD-10-CM | POA: Diagnosis not present

## 2020-02-04 DIAGNOSIS — H9190 Unspecified hearing loss, unspecified ear: Secondary | ICD-10-CM | POA: Diagnosis not present

## 2020-02-07 DIAGNOSIS — K219 Gastro-esophageal reflux disease without esophagitis: Secondary | ICD-10-CM | POA: Diagnosis not present

## 2020-02-07 DIAGNOSIS — I129 Hypertensive chronic kidney disease with stage 1 through stage 4 chronic kidney disease, or unspecified chronic kidney disease: Secondary | ICD-10-CM | POA: Diagnosis not present

## 2020-02-07 DIAGNOSIS — S32502D Unspecified fracture of left pubis, subsequent encounter for fracture with routine healing: Secondary | ICD-10-CM | POA: Diagnosis not present

## 2020-02-07 DIAGNOSIS — Z9181 History of falling: Secondary | ICD-10-CM | POA: Diagnosis not present

## 2020-02-07 DIAGNOSIS — N189 Chronic kidney disease, unspecified: Secondary | ICD-10-CM | POA: Diagnosis not present

## 2020-02-07 DIAGNOSIS — H9190 Unspecified hearing loss, unspecified ear: Secondary | ICD-10-CM | POA: Diagnosis not present

## 2020-02-07 DIAGNOSIS — L89151 Pressure ulcer of sacral region, stage 1: Secondary | ICD-10-CM | POA: Diagnosis not present

## 2020-02-08 DIAGNOSIS — N189 Chronic kidney disease, unspecified: Secondary | ICD-10-CM | POA: Diagnosis not present

## 2020-02-08 DIAGNOSIS — E782 Mixed hyperlipidemia: Secondary | ICD-10-CM

## 2020-02-08 DIAGNOSIS — K219 Gastro-esophageal reflux disease without esophagitis: Secondary | ICD-10-CM | POA: Diagnosis not present

## 2020-02-08 DIAGNOSIS — L89151 Pressure ulcer of sacral region, stage 1: Secondary | ICD-10-CM | POA: Diagnosis not present

## 2020-02-08 DIAGNOSIS — H9190 Unspecified hearing loss, unspecified ear: Secondary | ICD-10-CM | POA: Diagnosis not present

## 2020-02-08 DIAGNOSIS — I129 Hypertensive chronic kidney disease with stage 1 through stage 4 chronic kidney disease, or unspecified chronic kidney disease: Secondary | ICD-10-CM | POA: Diagnosis not present

## 2020-02-08 DIAGNOSIS — S32502D Unspecified fracture of left pubis, subsequent encounter for fracture with routine healing: Secondary | ICD-10-CM | POA: Diagnosis not present

## 2020-02-08 DIAGNOSIS — Z9181 History of falling: Secondary | ICD-10-CM | POA: Diagnosis not present

## 2020-02-09 DIAGNOSIS — I129 Hypertensive chronic kidney disease with stage 1 through stage 4 chronic kidney disease, or unspecified chronic kidney disease: Secondary | ICD-10-CM | POA: Diagnosis not present

## 2020-02-09 DIAGNOSIS — K219 Gastro-esophageal reflux disease without esophagitis: Secondary | ICD-10-CM | POA: Diagnosis not present

## 2020-02-09 DIAGNOSIS — N189 Chronic kidney disease, unspecified: Secondary | ICD-10-CM | POA: Diagnosis not present

## 2020-02-09 DIAGNOSIS — S32502D Unspecified fracture of left pubis, subsequent encounter for fracture with routine healing: Secondary | ICD-10-CM | POA: Diagnosis not present

## 2020-02-09 DIAGNOSIS — H9190 Unspecified hearing loss, unspecified ear: Secondary | ICD-10-CM | POA: Diagnosis not present

## 2020-02-09 DIAGNOSIS — Z9181 History of falling: Secondary | ICD-10-CM | POA: Diagnosis not present

## 2020-02-09 DIAGNOSIS — L89151 Pressure ulcer of sacral region, stage 1: Secondary | ICD-10-CM | POA: Diagnosis not present

## 2020-02-09 MED ORDER — PRAVASTATIN SODIUM 40 MG PO TABS: 40.0000 mg | ORAL_TABLET | Freq: Every evening | ORAL | 1 refills | Status: DC

## 2020-02-10 DIAGNOSIS — L89151 Pressure ulcer of sacral region, stage 1: Secondary | ICD-10-CM | POA: Diagnosis not present

## 2020-02-10 DIAGNOSIS — H9190 Unspecified hearing loss, unspecified ear: Secondary | ICD-10-CM | POA: Diagnosis not present

## 2020-02-10 DIAGNOSIS — S32502D Unspecified fracture of left pubis, subsequent encounter for fracture with routine healing: Secondary | ICD-10-CM | POA: Diagnosis not present

## 2020-02-10 DIAGNOSIS — I129 Hypertensive chronic kidney disease with stage 1 through stage 4 chronic kidney disease, or unspecified chronic kidney disease: Secondary | ICD-10-CM | POA: Diagnosis not present

## 2020-02-10 DIAGNOSIS — N189 Chronic kidney disease, unspecified: Secondary | ICD-10-CM | POA: Diagnosis not present

## 2020-02-10 DIAGNOSIS — K219 Gastro-esophageal reflux disease without esophagitis: Secondary | ICD-10-CM | POA: Diagnosis not present

## 2020-02-10 DIAGNOSIS — Z9181 History of falling: Secondary | ICD-10-CM | POA: Diagnosis not present

## 2020-02-14 DIAGNOSIS — N189 Chronic kidney disease, unspecified: Secondary | ICD-10-CM | POA: Diagnosis not present

## 2020-02-14 DIAGNOSIS — Z9181 History of falling: Secondary | ICD-10-CM | POA: Diagnosis not present

## 2020-02-14 DIAGNOSIS — I129 Hypertensive chronic kidney disease with stage 1 through stage 4 chronic kidney disease, or unspecified chronic kidney disease: Secondary | ICD-10-CM | POA: Diagnosis not present

## 2020-02-14 DIAGNOSIS — S32502D Unspecified fracture of left pubis, subsequent encounter for fracture with routine healing: Secondary | ICD-10-CM | POA: Diagnosis not present

## 2020-02-14 DIAGNOSIS — L89151 Pressure ulcer of sacral region, stage 1: Secondary | ICD-10-CM | POA: Diagnosis not present

## 2020-02-14 DIAGNOSIS — K219 Gastro-esophageal reflux disease without esophagitis: Secondary | ICD-10-CM | POA: Diagnosis not present

## 2020-02-14 DIAGNOSIS — H9190 Unspecified hearing loss, unspecified ear: Secondary | ICD-10-CM | POA: Diagnosis not present

## 2020-02-15 DIAGNOSIS — L89302 Pressure ulcer of unspecified buttock, stage 2: Secondary | ICD-10-CM | POA: Diagnosis not present

## 2020-02-15 DIAGNOSIS — R600 Localized edema: Secondary | ICD-10-CM | POA: Diagnosis not present

## 2020-02-15 DIAGNOSIS — N189 Chronic kidney disease, unspecified: Secondary | ICD-10-CM | POA: Diagnosis not present

## 2020-02-15 DIAGNOSIS — K219 Gastro-esophageal reflux disease without esophagitis: Secondary | ICD-10-CM | POA: Diagnosis not present

## 2020-02-15 DIAGNOSIS — L89151 Pressure ulcer of sacral region, stage 1: Secondary | ICD-10-CM | POA: Diagnosis not present

## 2020-02-15 DIAGNOSIS — Z9181 History of falling: Secondary | ICD-10-CM | POA: Diagnosis not present

## 2020-02-15 DIAGNOSIS — S32502D Unspecified fracture of left pubis, subsequent encounter for fracture with routine healing: Secondary | ICD-10-CM | POA: Diagnosis not present

## 2020-02-15 DIAGNOSIS — H9190 Unspecified hearing loss, unspecified ear: Secondary | ICD-10-CM | POA: Diagnosis not present

## 2020-02-15 DIAGNOSIS — I129 Hypertensive chronic kidney disease with stage 1 through stage 4 chronic kidney disease, or unspecified chronic kidney disease: Secondary | ICD-10-CM | POA: Diagnosis not present

## 2020-02-15 DIAGNOSIS — L299 Pruritus, unspecified: Secondary | ICD-10-CM | POA: Diagnosis not present

## 2020-02-15 DIAGNOSIS — I10 Essential (primary) hypertension: Secondary | ICD-10-CM | POA: Diagnosis not present

## 2020-02-16 DIAGNOSIS — L89151 Pressure ulcer of sacral region, stage 1: Secondary | ICD-10-CM | POA: Diagnosis not present

## 2020-02-16 DIAGNOSIS — H9190 Unspecified hearing loss, unspecified ear: Secondary | ICD-10-CM | POA: Diagnosis not present

## 2020-02-16 DIAGNOSIS — Z9181 History of falling: Secondary | ICD-10-CM | POA: Diagnosis not present

## 2020-02-16 DIAGNOSIS — I129 Hypertensive chronic kidney disease with stage 1 through stage 4 chronic kidney disease, or unspecified chronic kidney disease: Secondary | ICD-10-CM | POA: Diagnosis not present

## 2020-02-16 DIAGNOSIS — S32502D Unspecified fracture of left pubis, subsequent encounter for fracture with routine healing: Secondary | ICD-10-CM | POA: Diagnosis not present

## 2020-02-16 DIAGNOSIS — K219 Gastro-esophageal reflux disease without esophagitis: Secondary | ICD-10-CM | POA: Diagnosis not present

## 2020-02-16 DIAGNOSIS — N189 Chronic kidney disease, unspecified: Secondary | ICD-10-CM | POA: Diagnosis not present

## 2020-02-17 DIAGNOSIS — S32502D Unspecified fracture of left pubis, subsequent encounter for fracture with routine healing: Secondary | ICD-10-CM | POA: Diagnosis not present

## 2020-02-17 DIAGNOSIS — Z9181 History of falling: Secondary | ICD-10-CM | POA: Diagnosis not present

## 2020-02-17 DIAGNOSIS — K219 Gastro-esophageal reflux disease without esophagitis: Secondary | ICD-10-CM | POA: Diagnosis not present

## 2020-02-17 DIAGNOSIS — I129 Hypertensive chronic kidney disease with stage 1 through stage 4 chronic kidney disease, or unspecified chronic kidney disease: Secondary | ICD-10-CM | POA: Diagnosis not present

## 2020-02-17 DIAGNOSIS — H9190 Unspecified hearing loss, unspecified ear: Secondary | ICD-10-CM | POA: Diagnosis not present

## 2020-02-17 DIAGNOSIS — L89151 Pressure ulcer of sacral region, stage 1: Secondary | ICD-10-CM | POA: Diagnosis not present

## 2020-02-17 DIAGNOSIS — N189 Chronic kidney disease, unspecified: Secondary | ICD-10-CM | POA: Diagnosis not present

## 2020-02-23 DIAGNOSIS — L89151 Pressure ulcer of sacral region, stage 1: Secondary | ICD-10-CM | POA: Diagnosis not present

## 2020-02-23 DIAGNOSIS — Z9181 History of falling: Secondary | ICD-10-CM | POA: Diagnosis not present

## 2020-02-23 DIAGNOSIS — H9190 Unspecified hearing loss, unspecified ear: Secondary | ICD-10-CM | POA: Diagnosis not present

## 2020-02-23 DIAGNOSIS — N189 Chronic kidney disease, unspecified: Secondary | ICD-10-CM | POA: Diagnosis not present

## 2020-02-23 DIAGNOSIS — K219 Gastro-esophageal reflux disease without esophagitis: Secondary | ICD-10-CM | POA: Diagnosis not present

## 2020-02-23 DIAGNOSIS — I129 Hypertensive chronic kidney disease with stage 1 through stage 4 chronic kidney disease, or unspecified chronic kidney disease: Secondary | ICD-10-CM | POA: Diagnosis not present

## 2020-02-23 DIAGNOSIS — S32502D Unspecified fracture of left pubis, subsequent encounter for fracture with routine healing: Secondary | ICD-10-CM | POA: Diagnosis not present

## 2020-02-24 DIAGNOSIS — I129 Hypertensive chronic kidney disease with stage 1 through stage 4 chronic kidney disease, or unspecified chronic kidney disease: Secondary | ICD-10-CM | POA: Diagnosis not present

## 2020-02-24 DIAGNOSIS — L89151 Pressure ulcer of sacral region, stage 1: Secondary | ICD-10-CM | POA: Diagnosis not present

## 2020-02-24 DIAGNOSIS — H9190 Unspecified hearing loss, unspecified ear: Secondary | ICD-10-CM | POA: Diagnosis not present

## 2020-02-24 DIAGNOSIS — Z9181 History of falling: Secondary | ICD-10-CM | POA: Diagnosis not present

## 2020-02-24 DIAGNOSIS — N189 Chronic kidney disease, unspecified: Secondary | ICD-10-CM | POA: Diagnosis not present

## 2020-02-24 DIAGNOSIS — K219 Gastro-esophageal reflux disease without esophagitis: Secondary | ICD-10-CM | POA: Diagnosis not present

## 2020-02-24 DIAGNOSIS — S32502D Unspecified fracture of left pubis, subsequent encounter for fracture with routine healing: Secondary | ICD-10-CM | POA: Diagnosis not present

## 2020-02-25 DIAGNOSIS — K219 Gastro-esophageal reflux disease without esophagitis: Secondary | ICD-10-CM | POA: Diagnosis not present

## 2020-02-25 DIAGNOSIS — H9190 Unspecified hearing loss, unspecified ear: Secondary | ICD-10-CM | POA: Diagnosis not present

## 2020-02-25 DIAGNOSIS — N189 Chronic kidney disease, unspecified: Secondary | ICD-10-CM | POA: Diagnosis not present

## 2020-02-25 DIAGNOSIS — L89151 Pressure ulcer of sacral region, stage 1: Secondary | ICD-10-CM | POA: Diagnosis not present

## 2020-02-25 DIAGNOSIS — Z9181 History of falling: Secondary | ICD-10-CM | POA: Diagnosis not present

## 2020-02-25 DIAGNOSIS — I129 Hypertensive chronic kidney disease with stage 1 through stage 4 chronic kidney disease, or unspecified chronic kidney disease: Secondary | ICD-10-CM | POA: Diagnosis not present

## 2020-02-25 DIAGNOSIS — S32502D Unspecified fracture of left pubis, subsequent encounter for fracture with routine healing: Secondary | ICD-10-CM | POA: Diagnosis not present

## 2020-02-28 DIAGNOSIS — Z9181 History of falling: Secondary | ICD-10-CM | POA: Diagnosis not present

## 2020-02-28 DIAGNOSIS — K219 Gastro-esophageal reflux disease without esophagitis: Secondary | ICD-10-CM | POA: Diagnosis not present

## 2020-02-28 DIAGNOSIS — H9190 Unspecified hearing loss, unspecified ear: Secondary | ICD-10-CM | POA: Diagnosis not present

## 2020-02-28 DIAGNOSIS — S32502D Unspecified fracture of left pubis, subsequent encounter for fracture with routine healing: Secondary | ICD-10-CM | POA: Diagnosis not present

## 2020-02-28 DIAGNOSIS — N189 Chronic kidney disease, unspecified: Secondary | ICD-10-CM | POA: Diagnosis not present

## 2020-02-28 DIAGNOSIS — L89151 Pressure ulcer of sacral region, stage 1: Secondary | ICD-10-CM | POA: Diagnosis not present

## 2020-02-28 DIAGNOSIS — I129 Hypertensive chronic kidney disease with stage 1 through stage 4 chronic kidney disease, or unspecified chronic kidney disease: Secondary | ICD-10-CM | POA: Diagnosis not present

## 2020-02-29 ENCOUNTER — Other Ambulatory Visit: Payer: Self-pay | Admitting: *Deleted

## 2020-02-29 DIAGNOSIS — Z9181 History of falling: Secondary | ICD-10-CM | POA: Diagnosis not present

## 2020-02-29 DIAGNOSIS — S32502D Unspecified fracture of left pubis, subsequent encounter for fracture with routine healing: Secondary | ICD-10-CM | POA: Diagnosis not present

## 2020-02-29 DIAGNOSIS — N189 Chronic kidney disease, unspecified: Secondary | ICD-10-CM | POA: Diagnosis not present

## 2020-02-29 DIAGNOSIS — K219 Gastro-esophageal reflux disease without esophagitis: Secondary | ICD-10-CM | POA: Diagnosis not present

## 2020-02-29 DIAGNOSIS — H9190 Unspecified hearing loss, unspecified ear: Secondary | ICD-10-CM | POA: Diagnosis not present

## 2020-02-29 DIAGNOSIS — I129 Hypertensive chronic kidney disease with stage 1 through stage 4 chronic kidney disease, or unspecified chronic kidney disease: Secondary | ICD-10-CM | POA: Diagnosis not present

## 2020-02-29 DIAGNOSIS — L89151 Pressure ulcer of sacral region, stage 1: Secondary | ICD-10-CM | POA: Diagnosis not present

## 2020-02-29 DIAGNOSIS — G4700 Insomnia, unspecified: Secondary | ICD-10-CM

## 2020-02-29 MED ORDER — TRAZODONE HCL 50 MG PO TABS
25.0000 mg | ORAL_TABLET | Freq: Every evening | ORAL | 1 refills | Status: DC | PRN
Start: 2020-02-29 — End: 2020-03-06

## 2020-02-29 NOTE — Telephone Encounter (Signed)
Received refill Request from Pharmacy.  

## 2020-03-01 DIAGNOSIS — H9190 Unspecified hearing loss, unspecified ear: Secondary | ICD-10-CM | POA: Diagnosis not present

## 2020-03-01 DIAGNOSIS — N189 Chronic kidney disease, unspecified: Secondary | ICD-10-CM | POA: Diagnosis not present

## 2020-03-01 DIAGNOSIS — L89151 Pressure ulcer of sacral region, stage 1: Secondary | ICD-10-CM | POA: Diagnosis not present

## 2020-03-01 DIAGNOSIS — I129 Hypertensive chronic kidney disease with stage 1 through stage 4 chronic kidney disease, or unspecified chronic kidney disease: Secondary | ICD-10-CM | POA: Diagnosis not present

## 2020-03-01 DIAGNOSIS — S32502D Unspecified fracture of left pubis, subsequent encounter for fracture with routine healing: Secondary | ICD-10-CM | POA: Diagnosis not present

## 2020-03-01 DIAGNOSIS — K219 Gastro-esophageal reflux disease without esophagitis: Secondary | ICD-10-CM | POA: Diagnosis not present

## 2020-03-01 DIAGNOSIS — Z9181 History of falling: Secondary | ICD-10-CM | POA: Diagnosis not present

## 2020-03-03 NOTE — Telephone Encounter (Signed)
Patient never called back or left any messages to be contacted. Patient daughter has contacted office for refills and nothing further.

## 2020-03-06 ENCOUNTER — Other Ambulatory Visit: Payer: Self-pay

## 2020-03-06 ENCOUNTER — Telehealth (INDEPENDENT_AMBULATORY_CARE_PROVIDER_SITE_OTHER): Payer: Medicare Other | Admitting: Internal Medicine

## 2020-03-06 ENCOUNTER — Encounter: Payer: Self-pay | Admitting: Internal Medicine

## 2020-03-06 ENCOUNTER — Telehealth: Payer: Self-pay

## 2020-03-06 ENCOUNTER — Telehealth: Payer: Self-pay | Admitting: Internal Medicine

## 2020-03-06 DIAGNOSIS — R410 Disorientation, unspecified: Secondary | ICD-10-CM

## 2020-03-06 DIAGNOSIS — F0391 Unspecified dementia with behavioral disturbance: Secondary | ICD-10-CM | POA: Diagnosis not present

## 2020-03-06 DIAGNOSIS — R350 Frequency of micturition: Secondary | ICD-10-CM

## 2020-03-06 MED ORDER — LORAZEPAM 0.5 MG PO TABS: 0.5000 mg | ORAL_TABLET | Freq: Every day | ORAL | 0 refills | Status: DC | PRN

## 2020-03-06 MED ORDER — SULFAMETHOXAZOLE-TRIMETHOPRIM 800-160 MG PO TABS: 1.0000 | ORAL_TABLET | Freq: Two times a day (BID) | ORAL | 0 refills | Status: DC

## 2020-03-06 NOTE — Telephone Encounter (Signed)
Ms. jaspreet, bodner are scheduled for a virtual visit with your provider today.    Just as we do with appointments in the office, we must obtain your consent to participate.  Your consent will be active for this visit and any virtual visit you may have with one of our providers in the next 365 days.    If you have a MyChart account, I can also send a copy of this consent to you electronically.  All virtual visits are billed to your insurance company just like a traditional visit in the office.  As this is a virtual visit, video technology does not allow for your provider to perform a traditional examination.  This may limit your provider's ability to fully assess your condition.  If your provider identifies any concerns that need to be evaluated in person or the need to arrange testing such as labs, EKG, etc, we will make arrangements to do so.    Although advances in technology are sophisticated, we cannot ensure that it will always work on either your end or our end.  If the connection with a video visit is poor, we may have to switch to a telephone visit.  With either a video or telephone visit, we are not always able to ensure that we have a secure connection.   I need to obtain your verbal consent now.   Are you willing to proceed with your visit today?   HODA HON has provided verbal consent on 03/06/2020 for a virtual visit (video or telephone).   [place], [place] 03/06/2020  2:15 PM
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
## 2020-03-06 NOTE — Telephone Encounter (Signed)
Pt's daughter called and concerned her mother may have another UTI.  She's been up the past 2 nights trying to leave the house and talking "out of her head".  Ideally, we'd get a UA c+s but our office is closed and daughter has been up two nights straight with this and is tearful and exhausted.  I opted to send in abx today.  Recommended pushing fluids and giving her yogurt while on the abx.  Call back if not improving thurs or fri.  May need in person visit and labs at [facility] if not better (could be something other than a UTI).  I could hear pt yelling in the background, help me.

## 2020-03-07 DIAGNOSIS — H9190 Unspecified hearing loss, unspecified ear: Secondary | ICD-10-CM | POA: Diagnosis not present

## 2020-03-07 DIAGNOSIS — K219 Gastro-esophageal reflux disease without esophagitis: Secondary | ICD-10-CM | POA: Diagnosis not present

## 2020-03-07 DIAGNOSIS — L89151 Pressure ulcer of sacral region, stage 1: Secondary | ICD-10-CM | POA: Diagnosis not present

## 2020-03-07 DIAGNOSIS — S32502D Unspecified fracture of left pubis, subsequent encounter for fracture with routine healing: Secondary | ICD-10-CM | POA: Diagnosis not present

## 2020-03-07 DIAGNOSIS — I129 Hypertensive chronic kidney disease with stage 1 through stage 4 chronic kidney disease, or unspecified chronic kidney disease: Secondary | ICD-10-CM | POA: Diagnosis not present

## 2020-03-07 DIAGNOSIS — Z9181 History of falling: Secondary | ICD-10-CM | POA: Diagnosis not present

## 2020-03-07 DIAGNOSIS — N189 Chronic kidney disease, unspecified: Secondary | ICD-10-CM | POA: Diagnosis not present

## 2020-03-08 DIAGNOSIS — L89151 Pressure ulcer of sacral region, stage 1: Secondary | ICD-10-CM | POA: Diagnosis not present

## 2020-03-08 DIAGNOSIS — N189 Chronic kidney disease, unspecified: Secondary | ICD-10-CM | POA: Diagnosis not present

## 2020-03-08 DIAGNOSIS — I129 Hypertensive chronic kidney disease with stage 1 through stage 4 chronic kidney disease, or unspecified chronic kidney disease: Secondary | ICD-10-CM | POA: Diagnosis not present

## 2020-03-08 DIAGNOSIS — H9190 Unspecified hearing loss, unspecified ear: Secondary | ICD-10-CM | POA: Diagnosis not present

## 2020-03-08 DIAGNOSIS — K219 Gastro-esophageal reflux disease without esophagitis: Secondary | ICD-10-CM | POA: Diagnosis not present

## 2020-03-08 DIAGNOSIS — S32502D Unspecified fracture of left pubis, subsequent encounter for fracture with routine healing: Secondary | ICD-10-CM | POA: Diagnosis not present

## 2020-03-08 DIAGNOSIS — Z9181 History of falling: Secondary | ICD-10-CM | POA: Diagnosis not present

## 2020-03-10 DIAGNOSIS — S32502D Unspecified fracture of left pubis, subsequent encounter for fracture with routine healing: Secondary | ICD-10-CM | POA: Diagnosis not present

## 2020-03-10 DIAGNOSIS — N189 Chronic kidney disease, unspecified: Secondary | ICD-10-CM | POA: Diagnosis not present

## 2020-03-10 DIAGNOSIS — H9190 Unspecified hearing loss, unspecified ear: Secondary | ICD-10-CM | POA: Diagnosis not present

## 2020-03-10 DIAGNOSIS — I129 Hypertensive chronic kidney disease with stage 1 through stage 4 chronic kidney disease, or unspecified chronic kidney disease: Secondary | ICD-10-CM | POA: Diagnosis not present

## 2020-03-10 DIAGNOSIS — L89151 Pressure ulcer of sacral region, stage 1: Secondary | ICD-10-CM | POA: Diagnosis not present

## 2020-03-10 DIAGNOSIS — Z9181 History of falling: Secondary | ICD-10-CM | POA: Diagnosis not present

## 2020-03-10 DIAGNOSIS — K219 Gastro-esophageal reflux disease without esophagitis: Secondary | ICD-10-CM | POA: Diagnosis not present

## 2020-03-13 DIAGNOSIS — L89151 Pressure ulcer of sacral region, stage 1: Secondary | ICD-10-CM | POA: Diagnosis not present

## 2020-03-13 DIAGNOSIS — Z9181 History of falling: Secondary | ICD-10-CM | POA: Diagnosis not present

## 2020-03-13 DIAGNOSIS — K219 Gastro-esophageal reflux disease without esophagitis: Secondary | ICD-10-CM | POA: Diagnosis not present

## 2020-03-13 DIAGNOSIS — N189 Chronic kidney disease, unspecified: Secondary | ICD-10-CM | POA: Diagnosis not present

## 2020-03-13 DIAGNOSIS — H9190 Unspecified hearing loss, unspecified ear: Secondary | ICD-10-CM | POA: Diagnosis not present

## 2020-03-13 DIAGNOSIS — S32502D Unspecified fracture of left pubis, subsequent encounter for fracture with routine healing: Secondary | ICD-10-CM | POA: Diagnosis not present

## 2020-03-13 DIAGNOSIS — I129 Hypertensive chronic kidney disease with stage 1 through stage 4 chronic kidney disease, or unspecified chronic kidney disease: Secondary | ICD-10-CM | POA: Diagnosis not present

## 2020-03-16 DIAGNOSIS — Z9181 History of falling: Secondary | ICD-10-CM | POA: Diagnosis not present

## 2020-03-16 DIAGNOSIS — N189 Chronic kidney disease, unspecified: Secondary | ICD-10-CM | POA: Diagnosis not present

## 2020-03-16 DIAGNOSIS — L89151 Pressure ulcer of sacral region, stage 1: Secondary | ICD-10-CM | POA: Diagnosis not present

## 2020-03-16 DIAGNOSIS — K219 Gastro-esophageal reflux disease without esophagitis: Secondary | ICD-10-CM | POA: Diagnosis not present

## 2020-03-16 DIAGNOSIS — S32502D Unspecified fracture of left pubis, subsequent encounter for fracture with routine healing: Secondary | ICD-10-CM | POA: Diagnosis not present

## 2020-03-16 DIAGNOSIS — I129 Hypertensive chronic kidney disease with stage 1 through stage 4 chronic kidney disease, or unspecified chronic kidney disease: Secondary | ICD-10-CM | POA: Diagnosis not present

## 2020-03-16 DIAGNOSIS — H9190 Unspecified hearing loss, unspecified ear: Secondary | ICD-10-CM | POA: Diagnosis not present

## 2020-03-17 DIAGNOSIS — S32502D Unspecified fracture of left pubis, subsequent encounter for fracture with routine healing: Secondary | ICD-10-CM | POA: Diagnosis not present

## 2020-03-17 DIAGNOSIS — K219 Gastro-esophageal reflux disease without esophagitis: Secondary | ICD-10-CM | POA: Diagnosis not present

## 2020-03-17 DIAGNOSIS — L89151 Pressure ulcer of sacral region, stage 1: Secondary | ICD-10-CM | POA: Diagnosis not present

## 2020-03-17 DIAGNOSIS — Z9181 History of falling: Secondary | ICD-10-CM | POA: Diagnosis not present

## 2020-03-17 DIAGNOSIS — I129 Hypertensive chronic kidney disease with stage 1 through stage 4 chronic kidney disease, or unspecified chronic kidney disease: Secondary | ICD-10-CM | POA: Diagnosis not present

## 2020-03-17 DIAGNOSIS — H9190 Unspecified hearing loss, unspecified ear: Secondary | ICD-10-CM | POA: Diagnosis not present

## 2020-03-17 DIAGNOSIS — N189 Chronic kidney disease, unspecified: Secondary | ICD-10-CM | POA: Diagnosis not present

## 2020-03-20 DIAGNOSIS — K219 Gastro-esophageal reflux disease without esophagitis: Secondary | ICD-10-CM | POA: Diagnosis not present

## 2020-03-20 DIAGNOSIS — Z9181 History of falling: Secondary | ICD-10-CM | POA: Diagnosis not present

## 2020-03-20 DIAGNOSIS — S32502D Unspecified fracture of left pubis, subsequent encounter for fracture with routine healing: Secondary | ICD-10-CM | POA: Diagnosis not present

## 2020-03-20 DIAGNOSIS — N189 Chronic kidney disease, unspecified: Secondary | ICD-10-CM | POA: Diagnosis not present

## 2020-03-20 DIAGNOSIS — H9190 Unspecified hearing loss, unspecified ear: Secondary | ICD-10-CM | POA: Diagnosis not present

## 2020-03-20 DIAGNOSIS — L89151 Pressure ulcer of sacral region, stage 1: Secondary | ICD-10-CM | POA: Diagnosis not present

## 2020-03-20 DIAGNOSIS — I129 Hypertensive chronic kidney disease with stage 1 through stage 4 chronic kidney disease, or unspecified chronic kidney disease: Secondary | ICD-10-CM | POA: Diagnosis not present

## 2020-03-21 DIAGNOSIS — N189 Chronic kidney disease, unspecified: Secondary | ICD-10-CM | POA: Diagnosis not present

## 2020-03-21 DIAGNOSIS — S32502D Unspecified fracture of left pubis, subsequent encounter for fracture with routine healing: Secondary | ICD-10-CM | POA: Diagnosis not present

## 2020-03-21 DIAGNOSIS — H9190 Unspecified hearing loss, unspecified ear: Secondary | ICD-10-CM | POA: Diagnosis not present

## 2020-03-21 DIAGNOSIS — Z9181 History of falling: Secondary | ICD-10-CM | POA: Diagnosis not present

## 2020-03-21 DIAGNOSIS — K219 Gastro-esophageal reflux disease without esophagitis: Secondary | ICD-10-CM | POA: Diagnosis not present

## 2020-03-21 DIAGNOSIS — L89151 Pressure ulcer of sacral region, stage 1: Secondary | ICD-10-CM | POA: Diagnosis not present

## 2020-03-21 DIAGNOSIS — I129 Hypertensive chronic kidney disease with stage 1 through stage 4 chronic kidney disease, or unspecified chronic kidney disease: Secondary | ICD-10-CM | POA: Diagnosis not present

## 2020-03-22 ENCOUNTER — Encounter: Payer: Self-pay | Admitting: Family

## 2020-03-22 ENCOUNTER — Other Ambulatory Visit: Payer: Self-pay

## 2020-03-22 ENCOUNTER — Ambulatory Visit (INDEPENDENT_AMBULATORY_CARE_PROVIDER_SITE_OTHER): Payer: Medicare Other | Admitting: Family

## 2020-03-22 DIAGNOSIS — L89151 Pressure ulcer of sacral region, stage 1: Secondary | ICD-10-CM | POA: Diagnosis not present

## 2020-03-22 DIAGNOSIS — Z0000 Encounter for general adult medical examination without abnormal findings: Secondary | ICD-10-CM | POA: Diagnosis not present

## 2020-03-22 DIAGNOSIS — Z9181 History of falling: Secondary | ICD-10-CM | POA: Diagnosis not present

## 2020-03-22 DIAGNOSIS — K219 Gastro-esophageal reflux disease without esophagitis: Secondary | ICD-10-CM | POA: Diagnosis not present

## 2020-03-22 DIAGNOSIS — I129 Hypertensive chronic kidney disease with stage 1 through stage 4 chronic kidney disease, or unspecified chronic kidney disease: Secondary | ICD-10-CM | POA: Diagnosis not present

## 2020-03-22 DIAGNOSIS — H9190 Unspecified hearing loss, unspecified ear: Secondary | ICD-10-CM | POA: Diagnosis not present

## 2020-03-22 DIAGNOSIS — N189 Chronic kidney disease, unspecified: Secondary | ICD-10-CM | POA: Diagnosis not present

## 2020-03-22 DIAGNOSIS — S32502D Unspecified fracture of left pubis, subsequent encounter for fracture with routine healing: Secondary | ICD-10-CM | POA: Diagnosis not present

## 2020-03-22 NOTE — Progress Notes (Signed)
This service is provided via telemedicine  No vital signs collected/recorded due to the encounter was a telemedicine visit.   Location of patient (ex: home, work): Home  Patient consents to a telephone visit: Yes.  Location of the provider (ex: office, home): [facility].  Name of any referring provider: Minor Iden, Nelda Bucks, NP   Names of all persons participating in the telemedicine service and their role in the encounter: Patient, [place] Rush/Daughter, Heriberto Antigua, RMA, Faron Tudisco, [place], NP.    Time spent on call: 8 minutes spent on the phone with Medical Assistant.  Patient daughter Joaquina Nissen states that she cannot do 6CIT.   Subjective:   [name] is a 87 y.o. female who presents for Medicare Annual (Subsequent) preventive examination.  Review of Systems     Cardiac Risk Factors include: advanced age (>15men, >30 women);hypertension;dyslipidemia     Objective:    There were no vitals filed for this visit. There is no height or weight on file to calculate BMI.  Advanced Directives 03/22/2020 03/06/2020 01/10/2020 10/24/2019 10/15/2019 09/01/2017 03/13/2016  Does Patient Have a Medical Advance Directive? Yes Yes Yes Yes Yes No;Yes Yes  Type of Paramedic of [place];Living will [place];Living will Living will;Healthcare Power of Attorney Living will;Healthcare Power of Attorney Living will;Healthcare Power of Attorney Living will;Healthcare Power of Attorney Living will;Healthcare Power of Attorney  Does patient want to make changes to medical advance directive? No - Patient declined No - Patient declined No - Patient declined - No - Patient declined No - Patient declined -  Copy of University in Chart? Yes - validated most recent copy scanned in chart (See row information) Yes - validated most recent copy scanned in chart (See row information) Yes - validated most recent copy scanned in chart (See row  information) - Yes - validated most recent copy scanned in chart (See row information) - -  Would patient like information on creating a medical advance directive? - - - - - No - Patient declined -    Current Medications (verified) Outpatient Encounter Medications as of 03/22/2020  Medication Sig  . acetaminophen (TYLENOL) 500 MG tablet Take 500 mg by mouth daily at 12 noon.  Marland Kitchen LORazepam (ATIVAN) 0.5 MG tablet Take 1 tablet (0.5 mg total) by mouth daily as needed for anxiety.  . metoprolol succinate (TOPROL-XL) 25 MG 24 hr tablet Take 12.5 mg by mouth daily.  . pravastatin (PRAVACHOL) 40 MG tablet Take 1 tablet (40 mg total) by mouth every evening.  . SULFAMETHOXAZOLE-TRIMETHOPRIM PO Take 1 tablet by mouth in the morning and at bedtime. For 7 days.  Marland Kitchen HYDROcodone-acetaminophen (NORCO/VICODIN) 5-325 MG tablet Take 1 tablet by mouth every 6 (six) hours as needed for moderate pain.  . [DISCONTINUED] diltiazem (DILACOR XR) 180 MG 24 hr capsule Take 1 capsule (180 mg total) by mouth daily.   No facility-administered encounter medications on file as of 03/22/2020.    Allergies (verified) Patient has no known allergies.   History: Past Medical History:  Diagnosis Date  . Abnormal involuntary movements(781.0)   . Anemia   . Anxiety state, unspecified   . Bilateral swelling of feet   . Blood transfusion   . Cardiac dysrhythmia, unspecified   . Chronic constipation   . Dementia ([place])   . DJD (degenerative joint disease)   . Esophageal stricture   . Generalized osteoarthrosis, unspecified site   . GERD (gastroesophageal reflux disease)   .  Hearing loss   . Hiatal hernia   . Hypertension   . Macular hole of left eye   . Osteoporosis   . Pelvic adhesions   . Postgastrectomy syndrome   . Pure hypercholesterolemia   . Tortuous colon   . Tremor   . Urinary tract infection, site not specified   . Venous insufficiency   . Vitamin B12 deficiency    Past Surgical History:  Procedure  Laterality Date  . CHOLECYSTECTOMY    . COLONOSCOPY    . ESOPHAGUS SURGERY  2003  . mole biopsy- left neck area Left   . NISSEN FUNDOPLICATION    . TOTAL ABDOMINAL HYSTERECTOMY     DUB,   . UPPER GASTROINTESTINAL ENDOSCOPY     Family History  Problem Relation Age of Onset  . Cancer Mother 24       unknown type, most likely colon  . Heart disease Brother 68  . Stroke Sister 73  . High blood pressure Son 72  . Graves' disease Daughter   . High blood pressure Daughter 69  . High blood pressure Daughter 64  . Irritable bowel syndrome Daughter   . Heart failure Daughter 55   Social History   Socioeconomic History  . Marital status: Widowed    Spouse name: Not on file  . Number of children: 3  . Years of education: Not on file  . Highest education level: Not on file  Occupational History  . Occupation: Retired  Tobacco Use  . Smoking status: Never Smoker  . Smokeless tobacco: Never Used  Substance and Sexual Activity  . Alcohol use: Never    Alcohol/week: 0.0 standard drinks  . Drug use: Never  . Sexual activity: Not Currently  Other Topics Concern  . Not on file  Social History Narrative   Widowed,  4 children, lives close by.    Daughters [place], [place]) come to OV with her.      Diet:       Do you drink/ eat things with caffeine? Yes      Marital status:  Widowed                            What year were you married ? 1948      Do you live in a house, apartment,assistred living, condo, trailer, etc.)? Condo      Is it one or more stories? Two Stories      How many persons live in your home ? Self      Do you have any pets in your home ?(please list)  No      Highest Level of education completed: 12 th      Current or past profession:  [employer]      Do you exercise?   No                           Type & how often       ADVANCED DIRECTIVES (Please bring copies)      Do you have a living will?  Yes      Do you have a DNR form?    Yes                    If not, do you want to discuss one?       Do you have signed POA?HPOA forms?  Yes               If so, please bring to your appointment      FUNCTIONAL STATUS- To be completed by Spouse / child / Staff       Do you have difficulty bathing or dressing yourself ?        Do you have difficulty preparing food or eating ?      Do you have difficulty managing your mediation ? Yes      Do you have difficulty managing your finances ? Yes      Do you have difficulty affording your medication ? No      Social Determinants of Radio broadcast assistant Strain: Not on file  Food Insecurity: Not on file  Transportation Needs: Not on file  Physical Activity: Not on file  Stress: Not on file  Social Connections: Not on file    Tobacco Counseling Counseling given: Not Answered   Clinical Intake:  Pre-visit preparation completed: No  Pain : No/denies pain     BMI - recorded: 24.56  How often do you need to have someone help you when you read instructions, pamphlets, or other written materials from your doctor or pharmacy?: 3 - Sometimes (cognitive impairment) What is the last grade level you completed in school?: 12 Grade  Diabetic?No          Activities of Daily Living In your present state of health, do you have any difficulty performing the following activities: 03/22/2020  Hearing? Y  Comment wears hearing aids  Vision? N  Difficulty concentrating or making decisions? Y  Comment remembering  Walking or climbing stairs? Y  Comment uses a walker  Dressing or bathing? Y  Comment assist  Doing errands, shopping? Y  Comment daughter [place] and eating ? N  Using the Toilet? N  In the past six months, have you accidently leaked urine? Y  Do you have problems with loss of bowel control? N  Managing your Medications? Y  Comment daughter assist  Managing your Finances? Y  Housekeeping or managing your Housekeeping? Y  Some recent  data might be hidden    Patient Care Team: Eryck Negron, Nelda Bucks, NP as PCP - General (Family Medicine) Magnus Sinning, MD as Consulting Physician (Physical Medicine and Rehabilitation) Bo Merino, MD as Consulting Physician (Rheumatology) Irene Shipper, MD as Consulting Physician (Gastroenterology) Webb Laws, [place] as Referring Physician (Optometry) Clark-Burning, Anderson Malta, PA-C (Inactive) (Dermatology)  Indicate any recent Medical Services you may have received from other than [facility] providers in the past year (date may be approximate).     Assessment:   This is a routine wellness examination for [place].  Hearing/Vision screen  Hearing Screening   125Hz  250Hz  500Hz  1000Hz  2000Hz  3000Hz  4000Hz  6000Hz  8000Hz   Right ear:           Left ear:           Comments: No Hearing Concerns. Patient wears hearing aids and has lost 1.  Vision Screening Comments: No Vision Concerns. Last eye exam was 2021.  Dietary issues and exercise activities discussed: Current Exercise Habits: The patient does not participate in regular exercise at present, Exercise limited by: Other - see comments (cognitive impairment)  Goals   None    Depression Screen PHQ 2/9 Scores 03/22/2020 12/16/2018 06/17/2018 03/05/2018 09/01/2017 07/03/2017 07/03/2017  PHQ - 2 Score 0 0 0 0 0 0 0  PHQ- 9 Score - 0 1 1  0 0 0    Fall Risk Fall Risk  03/22/2020 03/06/2020 01/10/2020 10/15/2019 06/17/2018  Falls in the past year? 0 1 1 0 0  Number falls in past yr: 0 0 0 0 -  Injury with Fall? 0 1 1 0 -  Risk Factor Category  - - - - -  Risk for fall due to : - - - - -  Follow up - - - - Falls evaluation completed    FALL RISK PREVENTION PERTAINING TO THE HOME:  Any stairs in or around the home? Yes  If so, are there any without handrails? No  Home free of loose throw rugs in walkways, pet beds, electrical cords, etc? No  Adequate lighting in your home to reduce risk of falls? Yes   ASSISTIVE DEVICES UTILIZED TO  PREVENT FALLS:  Life alert? Yes  Use of a cane, walker or w/c? Yes  Grab bars in the bathroom? Yes  Shower chair or bench in shower? Yes  Elevated toilet seat or a handicapped toilet? Yes   TIMED UP AND GO:  Was the test performed? No .  Length of time to ambulate 10 feet: N/A  sec.   Gait slow and steady with assistive device  Cognitive Function: MMSE - Mini Mental State Exam 10/08/2018 07/03/2017 03/05/2017 11/21/2014  Orientation to time 0 5 2 3   Orientation to Place 2 4 3 4   Registration 3 3 3 3   Attention/ Calculation 0 5 1 2   Recall 0 0 0 0  Language- name 2 objects 2 2 2 2   Language- repeat 0 1 1 1   Language- follow 3 step command 2 - 3 3  Language- read & follow direction 1 1 1 1   Write a sentence 1 1 0 1  Copy design 1 0 1 0  Total score 12 - 17 20     6CIT Screen 03/22/2020 06/17/2018  What Year? (No Data) 4 points  What month? - 3 points  What time? - 0 points  Count back from 20 - 4 points  Months in reverse - 4 points  Repeat phrase - 10 points  Total Score - 25    Immunizations Immunization History  Administered Date(s) Administered  . Fluad Quad(high Dose 65+) 12/16/2018  . Influenza Split 12/04/2010, 11/12/2011  . Influenza Whole 11/13/2006, 11/12/2007, 11/17/2008, 11/15/2009  . Influenza, High Dose Seasonal PF 11/07/2014, 11/29/2019  . Influenza,inj,Quad PF,6+ Mos 11/11/2012  . Influenza-Unspecified 11/18/2013, 12/11/2015, 11/07/2016, 12/10/2017  . PFIZER(Purple Top)SARS-COV-2 Vaccination 02/20/2020  . Pneumococcal Conjugate-13 05/03/2014  . Pneumococcal Polysaccharide-23 12/10/2002, 04/19/2014  . Tdap 03/13/2016    TDAP status: Up to date  Flu Vaccine status: Up to date  Pneumococcal vaccine status: Up to date  Covid-19 vaccine status: Completed vaccines  Qualifies for Shingles Vaccine? Yes   Zostavax completed Yes   Shingrix Completed?: Yes  Screening Tests Health Maintenance  Topic Date Due  . COVID-19 Vaccine (2 - Pfizer risk 4-dose  series) 03/12/2020  . TETANUS/TDAP  03/13/2026  . INFLUENZA VACCINE  Completed  . DEXA SCAN  Completed  . PNA vac Low Risk Adult  Completed    Health Maintenance  Health Maintenance Due  Topic Date Due  . COVID-19 Vaccine (2 - Pfizer risk 4-dose series) 03/12/2020    Colorectal cancer screening: No longer required.   Mammogram status: No longer required due to advance age and cognitive .  Bone Density status: Completed 05/16/2015. Results reflect: Bone density results: OSTEOPOROSIS. Repeat every 0 daughter  declines to advance age and pain during procedure years.  Lung Cancer Screening: (Low Dose CT Chest recommended if Age 73-80 years, 30 pack-year currently smoking OR have quit w/in 15years.) does not qualify.   Lung Cancer Screening Referral: No   Additional Screening:  Hepatitis C Screening: does not qualify; Completed No   Vision Screening: Recommended annual ophthalmology exams for early detection of glaucoma and other disorders of the eye. Is the patient up to date with their annual eye exam?  Yes  Who is the provider or what is the name of the office in which the patient attends annual eye exams? [facility]  If pt is not established with a provider, would they like to be referred to a provider to establish care? No .   Dental Screening: Recommended annual dental exams for proper oral hygiene  Community Resource Referral / Chronic Care Management: CRR required this visit?  No   CCM required this visit?  No     Plan:    - COVID-19 boost   I have personally reviewed and noted the following in the patient's chart:   . Medical and social history . Use of alcohol, tobacco or illicit drugs  . Current medications and supplements . Functional ability and status . Nutritional status . Physical activity . Advanced directives . List of other physicians . Hospitalizations, surgeries, and ER visits in previous 12 months . Vitals . Screenings to include cognitive,  depression, and falls . Referrals and appointments  In addition, I have reviewed and discussed with patient certain preventive protocols, quality metrics, and best practice recommendations. A written personalized care plan for preventive services as well as general preventive health recommendations were provided to patient.     Nelda Bucks Liesa Tsan, NP   03/22/2020   Nurse Notes: Due for Bone density but POA has declined states had lots of back pain trying to lie on the tablet on previous exam.

## 2020-03-22 NOTE — Patient Instructions (Signed)
[name] , Thank you for taking time to come for your Medicare Wellness Visit. I appreciate your ongoing commitment to your health goals. Please review the following plan we discussed and let me know if I can assist you in the future.   Screening recommendations/referrals: Colonoscopy: N/A  Mammogram: N/A  Bone Density : Declined  Recommended yearly ophthalmology/optometry visit for glaucoma screening and checkup Recommended yearly dental visit for hygiene and checkup  Vaccinations: Influenza vaccine: Up to date  Pneumococcal vaccine : Up to date  Tdap vaccine : Up to date  Shingles vaccine: Please verify dates and notify provider's office    Advanced directives: Yes   Conditions/risks identified: Advance age female > 85 yrs,Hypertension,dyslipidemia   Next appointment: 1 year    Preventive Care 85 Years and Older, Female Preventive care refers to lifestyle choices and visits with your health care provider that can promote health and wellness. What does preventive care include?  A yearly physical exam. This is also called an annual well check.  Dental exams once or twice a year.  Routine eye exams. Ask your health care provider how often you should have your eyes checked.  Personal lifestyle choices, including:  Daily care of your teeth and gums.  Regular physical activity.  Eating a healthy diet.  Avoiding tobacco and drug use.  Limiting alcohol use.  Practicing safe sex.  Taking low-dose aspirin every day.  Taking vitamin and mineral supplements as recommended by your health care provider. What happens during an annual well check? The services and screenings done by your health care provider during your annual well check will depend on your age, overall health, lifestyle risk factors, and family history of disease. Counseling  Your health care provider may ask you questions about your:  Alcohol use.  Tobacco use.  Drug use.  Emotional  well-being.  Home and relationship well-being.  Sexual activity.  Eating habits.  History of falls.  Memory and ability to understand (cognition).  Work and work Statistician.  Reproductive health. Screening  You may have the following tests or measurements:  Height, weight, and BMI.  Blood pressure.  Lipid and cholesterol levels. These may be checked every 5 years, or more frequently if you are over 44 years old.  Skin check.  Lung cancer screening. You may have this screening every year starting at age 36 if you have a 30-pack-year history of smoking and currently smoke or have quit within the past 15 years.  Fecal occult blood test (FOBT) of the stool. You may have this test every year starting at age 39.  Flexible sigmoidoscopy or colonoscopy. You may have a sigmoidoscopy every 5 years or a colonoscopy every 10 years starting at age 24.  Hepatitis C blood test.  Hepatitis B blood test.  Sexually transmitted disease (STD) testing.  Diabetes screening. This is done by checking your blood sugar (glucose) after you have not eaten for a while (fasting). You may have this done every 1-3 years.  Bone density scan. This is done to screen for osteoporosis. You may have this done starting at age 5.  Mammogram. This may be done every 1-2 years. Talk to your health care provider about how often you should have regular mammograms. Talk with your health care provider about your test results, treatment options, and if necessary, the need for more tests. Vaccines  Your health care provider may recommend certain vaccines, such as:  Influenza vaccine. This is recommended every year.  Tetanus, diphtheria, and acellular  pertussis (Tdap, Td) vaccine. You may need a Td booster every 10 years.  Zoster vaccine. You may need this after age 18.  Pneumococcal 13-valent conjugate (PCV13) vaccine. One dose is recommended after age 31.  Pneumococcal polysaccharide (PPSV23) vaccine. One  dose is recommended after age 58. Talk to your health care provider about which screenings and vaccines you need and how often you need them. This information is not intended to replace advice given to you by your health care provider. Make sure you discuss any questions you have with your health care provider. Document Released: 03/03/2015 Document Revised: 10/25/2015 Document Reviewed: 12/06/2014 Elsevier Interactive Patient Education  2017 [place] Prevention in the Home Falls can cause injuries. They can happen to people of all ages. There are many things you can do to make your home safe and to help prevent falls. What can I do on the outside of my home?  Regularly fix the edges of walkways and driveways and fix any cracks.  Remove anything that might make you trip as you walk through a door, such as a raised step or threshold.  Trim any bushes or trees on the path to your home.  Use bright outdoor lighting.  Clear any walking paths of anything that might make someone trip, such as rocks or tools.  Regularly check to see if handrails are loose or broken. Make sure that both sides of any steps have handrails.  Any raised decks and porches should have guardrails on the edges.  Have any leaves, snow, or ice cleared regularly.  Use sand or salt on walking paths during winter.  Clean up any spills in your garage right away. This includes oil or grease spills. What can I do in the bathroom?  Use night lights.  Install grab bars by the toilet and in the tub and shower. Do not use towel bars as grab bars.  Use non-skid mats or decals in the tub or shower.  If you need to sit down in the shower, use a plastic, non-slip stool.  Keep the floor dry. Clean up any water that spills on the floor as soon as it happens.  Remove soap buildup in the tub or shower regularly.  Attach bath mats securely with double-sided non-slip rug tape.  Do not have throw rugs and other  things on the floor that can make you trip. What can I do in the bedroom?  Use night lights.  Make sure that you have a light by your bed that is easy to reach.  Do not use any sheets or blankets that are too big for your bed. They should not hang down onto the floor.  Have a firm chair that has side arms. You can use this for support while you get dressed.  Do not have throw rugs and other things on the floor that can make you trip. What can I do in the kitchen?  Clean up any spills right away.  Avoid walking on wet floors.  Keep items that you use a lot in easy-to-reach places.  If you need to reach something above you, use a strong step stool that has a grab bar.  Keep electrical cords out of the way.  Do not use floor polish or wax that makes floors slippery. If you must use wax, use non-skid floor wax.  Do not have throw rugs and other things on the floor that can make you trip. What can I do with my stairs?  Do not leave any items on the stairs.  Make sure that there are handrails on both sides of the stairs and use them. Fix handrails that are broken or loose. Make sure that handrails are as long as the stairways.  Check any carpeting to make sure that it is firmly attached to the stairs. Fix any carpet that is loose or worn.  Avoid having throw rugs at the top or bottom of the stairs. If you do have throw rugs, attach them to the floor with carpet tape.  Make sure that you have a light switch at the top of the stairs and the bottom of the stairs. If you do not have them, ask someone to add them for you. What else can I do to help prevent falls?  Wear shoes that:  Do not have high heels.  Have rubber bottoms.  Are comfortable and fit you well.  Are closed at the toe. Do not wear sandals.  If you use a stepladder:  Make sure that it is fully opened. Do not climb a closed stepladder.  Make sure that both sides of the stepladder are locked into place.  Ask  someone to hold it for you, if possible.  Clearly mark and make sure that you can see:  Any grab bars or handrails.  First and last steps.  Where the edge of each step is.  Use tools that help you move around (mobility aids) if they are needed. These include:  Canes.  Walkers.  Scooters.  Crutches.  Turn on the lights when you go into a dark area. Replace any light bulbs as soon as they burn out.  Set up your furniture so you have a clear path. Avoid moving your furniture around.  If any of your floors are uneven, fix them.  If there are any pets around you, be aware of where they are.  Review your medicines with your doctor. Some medicines can make you feel dizzy. This can increase your chance of falling. Ask your doctor what other things that you can do to help prevent falls. This information is not intended to replace advice given to you by your health care provider. Make sure you discuss any questions you have with your health care provider. Document Released: 12/01/2008 Document Revised: 07/13/2015 Document Reviewed: 03/11/2014 Elsevier Interactive Patient Education  2017 [employer].

## 2020-03-28 DIAGNOSIS — S32502D Unspecified fracture of left pubis, subsequent encounter for fracture with routine healing: Secondary | ICD-10-CM | POA: Diagnosis not present

## 2020-03-28 DIAGNOSIS — I129 Hypertensive chronic kidney disease with stage 1 through stage 4 chronic kidney disease, or unspecified chronic kidney disease: Secondary | ICD-10-CM | POA: Diagnosis not present

## 2020-03-28 DIAGNOSIS — K219 Gastro-esophageal reflux disease without esophagitis: Secondary | ICD-10-CM | POA: Diagnosis not present

## 2020-03-28 DIAGNOSIS — N189 Chronic kidney disease, unspecified: Secondary | ICD-10-CM | POA: Diagnosis not present

## 2020-03-28 DIAGNOSIS — Z9181 History of falling: Secondary | ICD-10-CM | POA: Diagnosis not present

## 2020-03-28 DIAGNOSIS — H9190 Unspecified hearing loss, unspecified ear: Secondary | ICD-10-CM | POA: Diagnosis not present

## 2020-03-28 DIAGNOSIS — L89151 Pressure ulcer of sacral region, stage 1: Secondary | ICD-10-CM | POA: Diagnosis not present

## 2020-03-29 ENCOUNTER — Telehealth: Payer: Self-pay | Admitting: *Deleted

## 2020-03-29 DIAGNOSIS — I129 Hypertensive chronic kidney disease with stage 1 through stage 4 chronic kidney disease, or unspecified chronic kidney disease: Secondary | ICD-10-CM | POA: Diagnosis not present

## 2020-03-29 DIAGNOSIS — L89151 Pressure ulcer of sacral region, stage 1: Secondary | ICD-10-CM | POA: Diagnosis not present

## 2020-03-29 DIAGNOSIS — Z9181 History of falling: Secondary | ICD-10-CM | POA: Diagnosis not present

## 2020-03-29 DIAGNOSIS — S32502D Unspecified fracture of left pubis, subsequent encounter for fracture with routine healing: Secondary | ICD-10-CM | POA: Diagnosis not present

## 2020-03-29 DIAGNOSIS — H9190 Unspecified hearing loss, unspecified ear: Secondary | ICD-10-CM | POA: Diagnosis not present

## 2020-03-29 DIAGNOSIS — N189 Chronic kidney disease, unspecified: Secondary | ICD-10-CM | POA: Diagnosis not present

## 2020-03-29 DIAGNOSIS — K219 Gastro-esophageal reflux disease without esophagitis: Secondary | ICD-10-CM | POA: Diagnosis not present

## 2020-03-29 NOTE — Telephone Encounter (Signed)
[name] called requesting verbal orders for Speech Therapy 1x4wks Verbal orders given.

## 2020-03-29 NOTE — Telephone Encounter (Signed)
Cara with [facility] called and stated that patient blood pressure was 196/98 and retake was 186/100.  Patient is only taking Metoprolol and had already taken it before nurse got to home. (had been more than an hour)  Patient was taking Cardizem and Metoprolol but the Cardizem was stopped about 3 weeks ago.   They are wondering if it should be added back to her medications. Daughter is ok with this.   Please Advise.    Verbal orders were also given at the time for Nursing 1x4wks.

## 2020-03-29 NOTE — Telephone Encounter (Signed)
May restart Cardizem and check blood pressure daily and notify provider if still above 150/90

## 2020-03-30 DIAGNOSIS — S32502D Unspecified fracture of left pubis, subsequent encounter for fracture with routine healing: Secondary | ICD-10-CM | POA: Diagnosis not present

## 2020-03-30 DIAGNOSIS — H9190 Unspecified hearing loss, unspecified ear: Secondary | ICD-10-CM | POA: Diagnosis not present

## 2020-03-30 DIAGNOSIS — I129 Hypertensive chronic kidney disease with stage 1 through stage 4 chronic kidney disease, or unspecified chronic kidney disease: Secondary | ICD-10-CM | POA: Diagnosis not present

## 2020-03-30 DIAGNOSIS — K219 Gastro-esophageal reflux disease without esophagitis: Secondary | ICD-10-CM | POA: Diagnosis not present

## 2020-03-30 DIAGNOSIS — L89151 Pressure ulcer of sacral region, stage 1: Secondary | ICD-10-CM | POA: Diagnosis not present

## 2020-03-30 DIAGNOSIS — N189 Chronic kidney disease, unspecified: Secondary | ICD-10-CM | POA: Diagnosis not present

## 2020-03-30 DIAGNOSIS — Z9181 History of falling: Secondary | ICD-10-CM | POA: Diagnosis not present

## 2020-03-30 NOTE — Telephone Encounter (Signed)
Spoke with patients daughter and she verbalized understanding of [name]'s reply. Per [name] patient does not need a new script for Cardizem as she has enough on hand. Cardizem was added back to medication list.

## 2020-04-07 DIAGNOSIS — I129 Hypertensive chronic kidney disease with stage 1 through stage 4 chronic kidney disease, or unspecified chronic kidney disease: Secondary | ICD-10-CM | POA: Diagnosis not present

## 2020-04-07 DIAGNOSIS — H9190 Unspecified hearing loss, unspecified ear: Secondary | ICD-10-CM | POA: Diagnosis not present

## 2020-04-07 DIAGNOSIS — K626 Ulcer of anus and rectum: Secondary | ICD-10-CM | POA: Diagnosis not present

## 2020-04-07 DIAGNOSIS — Z9181 History of falling: Secondary | ICD-10-CM | POA: Diagnosis not present

## 2020-04-07 DIAGNOSIS — S32502D Unspecified fracture of left pubis, subsequent encounter for fracture with routine healing: Secondary | ICD-10-CM | POA: Diagnosis not present

## 2020-04-07 DIAGNOSIS — L89151 Pressure ulcer of sacral region, stage 1: Secondary | ICD-10-CM | POA: Diagnosis not present

## 2020-04-07 DIAGNOSIS — N189 Chronic kidney disease, unspecified: Secondary | ICD-10-CM | POA: Diagnosis not present

## 2020-04-07 DIAGNOSIS — K219 Gastro-esophageal reflux disease without esophagitis: Secondary | ICD-10-CM | POA: Diagnosis not present

## 2020-04-10 DIAGNOSIS — S32592A Other specified fracture of left pubis, initial encounter for closed fracture: Secondary | ICD-10-CM | POA: Diagnosis not present

## 2020-04-15 DIAGNOSIS — L89151 Pressure ulcer of sacral region, stage 1: Secondary | ICD-10-CM | POA: Diagnosis not present

## 2020-04-15 DIAGNOSIS — Z9181 History of falling: Secondary | ICD-10-CM | POA: Diagnosis not present

## 2020-04-15 DIAGNOSIS — N189 Chronic kidney disease, unspecified: Secondary | ICD-10-CM | POA: Diagnosis not present

## 2020-04-15 DIAGNOSIS — K626 Ulcer of anus and rectum: Secondary | ICD-10-CM | POA: Diagnosis not present

## 2020-04-15 DIAGNOSIS — S32502D Unspecified fracture of left pubis, subsequent encounter for fracture with routine healing: Secondary | ICD-10-CM | POA: Diagnosis not present

## 2020-04-15 DIAGNOSIS — H9190 Unspecified hearing loss, unspecified ear: Secondary | ICD-10-CM | POA: Diagnosis not present

## 2020-04-15 DIAGNOSIS — K219 Gastro-esophageal reflux disease without esophagitis: Secondary | ICD-10-CM | POA: Diagnosis not present

## 2020-04-15 DIAGNOSIS — I129 Hypertensive chronic kidney disease with stage 1 through stage 4 chronic kidney disease, or unspecified chronic kidney disease: Secondary | ICD-10-CM | POA: Diagnosis not present

## 2020-04-17 ENCOUNTER — Other Ambulatory Visit: Payer: Self-pay

## 2020-04-17 ENCOUNTER — Encounter: Payer: Self-pay | Admitting: Internal Medicine

## 2020-04-17 ENCOUNTER — Ambulatory Visit: Payer: Medicare Other | Admitting: Internal Medicine

## 2020-04-17 VITALS — BP 130/70 | HR 51 | Temp 96.6°F | Ht 61.0 in | Wt 122.4 lb

## 2020-04-17 DIAGNOSIS — Z87440 Personal history of urinary (tract) infections: Secondary | ICD-10-CM

## 2020-04-17 DIAGNOSIS — F0391 Unspecified dementia with behavioral disturbance: Secondary | ICD-10-CM | POA: Diagnosis not present

## 2020-04-17 DIAGNOSIS — H6121 Impacted cerumen, right ear: Secondary | ICD-10-CM

## 2020-04-17 DIAGNOSIS — F419 Anxiety disorder, unspecified: Secondary | ICD-10-CM | POA: Diagnosis not present

## 2020-04-17 MED ORDER — SULFAMETHOXAZOLE-TRIMETHOPRIM 400-80 MG PO TABS: 1.0000 | ORAL_TABLET | Freq: Two times a day (BID) | ORAL | 0 refills | Status: DC

## 2020-04-17 NOTE — Patient Instructions (Signed)
[place]

## 2020-04-17 NOTE — Progress Notes (Signed)
Location:  [facility] clinic Provider:  Sadaf Przybysz L. Mariea Clonts, D.O., C.M.D.  Goals of Care:  Advanced Directives 04/17/2020  Does Patient Have a Medical Advance Directive? Yes  Type of Paramedic of [place];Living will  Does patient want to make changes to medical advance directive? No - Patient declined  Copy of [place] in Chart? Yes - validated most recent copy scanned in chart (See row information)  Would patient like information on creating a medical advance directive? -     Chief Complaint  Patient presents with  . Medical Management of Chronic Issues    6 month follow up visit.  Trazadone seemed to work backwards the last time given to her so patient is not taking at the moment.Patient here with daughter, Gertie Baron.    HPI: Patient is a 87 y.o. female seen today for medical management of chronic diseases.  PCP is Dinah.  Here with her daughter.  Trazodone seemed to make her more awake instead of rest at night.    She's been doing better after recovering from UTI.  Has not needed lorazepam since then--two uses at that time--1/2 pill  She goes to bed at 6pm and then gets up at midnight.  Getting days and nights mixed up.    Still living alone.  They each go daily to help her get her meals twice a day.  They have cameras to watch her.    No hallucinations.  She does not wander.  Will go to front door, look out, then go to back door, open it.    Has forgotten what refrigerator is and they have to put snacks out and water and other drink out on her table.  Since they brought her home from [place], she's not known the fridge.  ST came and had her figure out the routine--did it twice after that and then not again.    10/08/18, MMSE 12/30.  Discussed that it's not safe for her to be alone with this reading.  She has refused to do anything else.  They had a poor experience at [facility].  She has a life alert button that does not have to be mashed  if she did fall.  Ander Gaster is turned off.  Phone has their pictures on it so she can push buttons for whichever of them, but she calls her brother instead.  She's been resistant to caregiver in home, but may not have been the best person.  She went into the bathroom and she wouldn't come out.    Discussed option of an adult day program--suggested [facility].  Pamphlet provided.  Past Medical History:  Diagnosis Date  . Abnormal involuntary movements(781.0)   . Anemia   . Anxiety state, unspecified   . Bilateral swelling of feet   . Blood transfusion   . Cardiac dysrhythmia, unspecified   . Chronic constipation   . Dementia ([place])   . DJD (degenerative joint disease)   . Esophageal stricture   . Generalized osteoarthrosis, unspecified site   . GERD (gastroesophageal reflux disease)   . Hearing loss   . Hiatal hernia   . Hypertension   . Macular hole of left eye   . Osteoporosis   . Pelvic adhesions   . Postgastrectomy syndrome   . Pure hypercholesterolemia   . Tortuous colon   . Tremor   . Urinary tract infection, site not specified   . Venous insufficiency   . Vitamin B12 deficiency  Past Surgical History:  Procedure Laterality Date  . CHOLECYSTECTOMY    . COLONOSCOPY    . ESOPHAGUS SURGERY  2003  . mole biopsy- left neck area Left   . NISSEN FUNDOPLICATION    . TOTAL ABDOMINAL HYSTERECTOMY     DUB,   . UPPER GASTROINTESTINAL ENDOSCOPY      No Known Allergies  Outpatient Encounter Medications as of 04/17/2020  Medication Sig  . diltiazem (CARDIZEM CD) 180 MG 24 hr capsule Take 1 capsule (180 mg total) by mouth daily.  Marland Kitchen HYDROcodone-acetaminophen (NORCO/VICODIN) 5-325 MG tablet Take 1 tablet by mouth every 6 (six) hours as needed for moderate pain.  Marland Kitchen LORazepam (ATIVAN) 0.5 MG tablet Take 1 tablet (0.5 mg total) by mouth daily as needed for anxiety.  . metoprolol succinate (TOPROL-XL) 25 MG 24 hr tablet Take 12.5 mg by mouth daily.  .  pravastatin (PRAVACHOL) 40 MG tablet Take 1 tablet (40 mg total) by mouth every evening.  . traZODone (DESYREL) 50 MG tablet Take 50 mg by mouth at bedtime.  Marland Kitchen acetaminophen (TYLENOL) 500 MG tablet Take 500 mg by mouth daily at 12 noon. As needed (Patient not taking: Reported on 04/17/2020)  . SULFAMETHOXAZOLE-TRIMETHOPRIM PO Take 1 tablet by mouth in the morning and at bedtime. For 7 days.  . [DISCONTINUED] diltiazem (DILACOR XR) 180 MG 24 hr capsule Take 1 capsule (180 mg total) by mouth daily.   No facility-administered encounter medications on file as of 04/17/2020.    Review of Systems:  Review of Systems  Constitutional: Negative for chills and fever.  HENT: Positive for hearing loss.        Right cerumen impaction  Eyes: Negative for blurred vision.  Respiratory: Negative for cough and shortness of breath.   Cardiovascular: Negative for chest pain, palpitations and leg swelling.  Gastrointestinal: Negative for abdominal pain, blood in stool, constipation, diarrhea and melena.  Genitourinary: Negative for dysuria.  Musculoskeletal: Negative for falls and joint pain.  Skin: Negative for itching and rash.  Neurological: Negative for dizziness and loss of consciousness.  Psychiatric/Behavioral: Positive for memory loss. Negative for depression. The patient is nervous/anxious.     Health Maintenance  Topic Date Due  . COVID-19 Vaccine (3 - Pfizer risk 4-dose series) 04/09/2020  . TETANUS/TDAP  03/13/2026  . INFLUENZA VACCINE  Completed  . DEXA SCAN  Completed  . PNA vac Low Risk Adult  Completed    Physical Exam: Vitals:   04/17/20 0940  BP: 130/70  Pulse: (!) 51  Temp: (!) 96.6 F (35.9 C)  SpO2: 97%  Weight: 122 lb 6.4 oz (55.5 kg)  Height: 5\' 1"  (1.549 m)   Body mass index is 23.13 kg/m. Physical Exam Vitals reviewed.  Constitutional:      Appearance: Normal appearance.  HENT:     Head: Normocephalic and atraumatic.  Eyes:     Comments: Glasses, has hole in  macula on right per daughter  Cardiovascular:     Rate and Rhythm: Normal rate and regular rhythm.  Pulmonary:     Effort: Pulmonary effort is normal.     Breath sounds: Normal breath sounds. No wheezing, rhonchi or rales.  Abdominal:     General: Bowel sounds are normal.  Musculoskeletal:        General: Normal range of motion.     Right lower leg: No edema.     Left lower leg: No edema.  Skin:    General: Skin is warm and dry.  Neurological:  General: No focal deficit present.     Mental Status: She is alert.     Gait: Gait abnormal.     Comments: Uses walker  Psychiatric:        Mood and Affect: Mood normal.     Labs reviewed: Basic Metabolic Panel: Recent Labs    10/15/19 1522 10/24/19 0738 11/08/19 0844  NA 138 140 143  K 3.4* 3.6 3.6  CL 104 107 107  CO2 26 22 26   GLUCOSE 110 107* 83  BUN 13 10 12   CREATININE 1.04* 0.94 0.83  CALCIUM 9.0 9.4 9.3  TSH 1.48  --   --    Liver Function Tests: Recent Labs    07/15/19 0853 10/15/19 1522  AST 18 19  ALT 10 11  ALKPHOS 89  --   BILITOT 0.9 1.1  PROT 6.5 6.4  ALBUMIN 4.2  --    No results for input(s): LIPASE, AMYLASE in the last 8760 hours. No results for input(s): AMMONIA in the last 8760 hours. CBC: Recent Labs    07/15/19 0853 10/15/19 1522 10/24/19 0738  WBC 5.7 6.5 7.2  NEUTROABS 3.2 3,510  --   HGB 12.0 12.2 12.4  HCT 35.8 36.9 38.2  MCV 92 92.5 92.9  PLT 243 278 271   Lipid Panel: Recent Labs    07/15/19 0853  CHOL 115  HDL 42  LDLCALC 55  TRIG 91  CHOLHDL 2.7   Lab Results  Component Value Date   HGBA1C 5.3 10/08/2018    Procedures since last visit: No results found.  Assessment/Plan 1. Dementia with behavioral disturbance, unspecified dementia type ([place]) -progressing, last mmse in 2020 was 12/30  -lives alone with cameras and daughters coming in to bring meals -discussed my concerns about her safety despite this and life alert b/c she does not know what to do in event  of emergency--would call son probably who would then call daughters who are local -discussed adult day program  2. History of UTI -asked to have one Rx for bactrim on hand in case of emergency--sent to pharmacy to keep Rx available  3. Anxiety -has not needed lorazepam for agitation/combativeness since she recovered from her UTI  4. Hearing loss of right ear due to cerumen impaction -right ear flushed with warm water and peroxide by CMA  Labs/tests ordered:   Lab Orders  No laboratory test(s) ordered today   Next appt:  4 mos med mgt   Kamry Faraci L. Worthy Boschert, D.O. [facility] [address], [place] [address] Cell Phone (Mon-Fri 8am-5pm):  [phone] On Call:  [phone] & follow prompts after 5pm & weekends Office Phone:  [phone] Office Fax:  [phone]

## 2020-04-21 ENCOUNTER — Ambulatory Visit: Payer: Medicare Other | Admitting: Family

## 2020-04-26 ENCOUNTER — Telehealth: Payer: Self-pay

## 2020-04-26 DIAGNOSIS — K626 Ulcer of anus and rectum: Secondary | ICD-10-CM | POA: Diagnosis not present

## 2020-04-26 DIAGNOSIS — I129 Hypertensive chronic kidney disease with stage 1 through stage 4 chronic kidney disease, or unspecified chronic kidney disease: Secondary | ICD-10-CM | POA: Diagnosis not present

## 2020-04-26 DIAGNOSIS — N189 Chronic kidney disease, unspecified: Secondary | ICD-10-CM | POA: Diagnosis not present

## 2020-04-26 DIAGNOSIS — K219 Gastro-esophageal reflux disease without esophagitis: Secondary | ICD-10-CM | POA: Diagnosis not present

## 2020-04-26 DIAGNOSIS — L89151 Pressure ulcer of sacral region, stage 1: Secondary | ICD-10-CM | POA: Diagnosis not present

## 2020-04-26 DIAGNOSIS — Z9181 History of falling: Secondary | ICD-10-CM | POA: Diagnosis not present

## 2020-04-26 DIAGNOSIS — S32502D Unspecified fracture of left pubis, subsequent encounter for fracture with routine healing: Secondary | ICD-10-CM | POA: Diagnosis not present

## 2020-04-26 DIAGNOSIS — H9190 Unspecified hearing loss, unspecified ear: Secondary | ICD-10-CM | POA: Diagnosis not present

## 2020-04-26 NOTE — Telephone Encounter (Signed)
Matt from [facility] called for orders for patient to be admitted to [facility] he said he would fax over the request which the patient has signed off on but he needs signed order from Dr. Mariea Clonts or verbal order

## 2020-06-22 ENCOUNTER — Encounter: Payer: Self-pay | Admitting: Orthopedic Surgery

## 2020-06-22 ENCOUNTER — Other Ambulatory Visit: Payer: Self-pay

## 2020-06-22 ENCOUNTER — Ambulatory Visit (INDEPENDENT_AMBULATORY_CARE_PROVIDER_SITE_OTHER): Payer: Medicare Other | Admitting: Orthopedic Surgery

## 2020-06-22 VITALS — BP 132/74 | HR 56 | Temp 98.6°F | Ht 61.0 in | Wt 130.4 lb

## 2020-06-22 DIAGNOSIS — Z7189 Other specified counseling: Secondary | ICD-10-CM

## 2020-06-22 DIAGNOSIS — F0391 Unspecified dementia with behavioral disturbance: Secondary | ICD-10-CM

## 2020-06-22 DIAGNOSIS — I10 Essential (primary) hypertension: Secondary | ICD-10-CM

## 2020-06-22 DIAGNOSIS — E782 Mixed hyperlipidemia: Secondary | ICD-10-CM

## 2020-06-22 DIAGNOSIS — R600 Localized edema: Secondary | ICD-10-CM

## 2020-06-22 DIAGNOSIS — N183 Chronic kidney disease, stage 3 unspecified: Secondary | ICD-10-CM | POA: Diagnosis not present

## 2020-06-22 DIAGNOSIS — H6123 Impacted cerumen, bilateral: Secondary | ICD-10-CM

## 2020-06-22 NOTE — Progress Notes (Signed)
Careteam: Patient Care Team: Yvonna Alanis, NP as PCP - General (Adult Health Nurse Practitioner) Magnus Sinning, MD as Consulting Physician (Physical Medicine and Rehabilitation) Bo Merino, MD as Consulting Physician (Rheumatology) Irene Shipper, MD as Consulting Physician (Gastroenterology) Webb Laws, [place] as Referring Physician (Optometry) Clark-Burning, Anderson [place], PA-C (Inactive) (Dermatology)  Seen by: Windell Moulding, AGNP-C  PLACE OF SERVICE:  [place] Directive information Does Patient Have a Medical Advance Directive?: Yes, Type of Advance Directive: Living will;Healthcare Power of Attorney, Does patient want to make changes to medical advance directive?: No - Patient declined  No Known Allergies  Chief Complaint  Patient presents with  . Follow-up    Discuss home health and FL2. Patients daughter would like to discuss leg swelling for the past month.     HPI: Patient is a 87 y.o. female seen today for medical management of chronic conditions.   Daughters present for encounter.   Daughters concerned about her dementia and care. At this time, she continues to live alone with help. Daughters check in on mom with camaras placed in home. They also provide prepared meals and administer daily medications. She has become more confused with recognizing day and night times. This morning, she thought it was bedtime. She is also repeating short phrases like " I am confused." No recent behavioral outbursts. No recent falls or wondering reported. Daughters are concerned she is needing more help, requesting home health and FL2 to be completed today.   Continues to use bilateral hearing aids. Using debrox for wax buildup. Requesting ears be checked today.  Swelling of bilateral ankles persists. Follows low sodium diet. She has a recliner at home, but forgets to elevate legs. Daughters will have her sit on couch and elevate legs with pillows. They have also  purchased compression stockings.   Home health PT stopped in March. They would like to restart PT, but she will not open the door when she is alone. Daughters do not think they can schedule home PT visits if she will not open door.   Care options discussed with daughters. Advised to contact insurance and understand coverage. Advised to call local care agencies for sitter/aide information. Nursing facility also discussed.    Review of Systems:  Review of Systems  Unable to perform ROS: Dementia    Past Medical History:  Diagnosis Date  . Abnormal involuntary movements(781.0)   . Anemia   . Anxiety state, unspecified   . Bilateral swelling of feet   . Blood transfusion   . Cardiac dysrhythmia, unspecified   . Chronic constipation   . Dementia ([place])   . DJD (degenerative joint disease)   . Esophageal stricture   . Generalized osteoarthrosis, unspecified site   . GERD (gastroesophageal reflux disease)   . Hearing loss   . Hiatal hernia   . Hypertension   . Macular hole of left eye   . Osteoporosis   . Pelvic adhesions   . Postgastrectomy syndrome   . Pure hypercholesterolemia   . Tortuous colon   . Tremor   . Urinary tract infection, site not specified   . Venous insufficiency   . Vitamin B12 deficiency    Past Surgical History:  Procedure Laterality Date  . CHOLECYSTECTOMY    . COLONOSCOPY    . ESOPHAGUS SURGERY  2003  . mole biopsy- left neck area Left   . NISSEN FUNDOPLICATION    . TOTAL ABDOMINAL HYSTERECTOMY     DUB,   .  UPPER GASTROINTESTINAL ENDOSCOPY     Social History:   reports that she has never smoked. She has never used smokeless tobacco. She reports that she does not drink alcohol and does not use drugs.  Family History  Problem Relation Age of Onset  . Cancer Mother 8       unknown type, most likely colon  . Heart disease Brother 17  . Stroke Sister 48  . High blood pressure Son 72  . Graves' disease Daughter   . High blood pressure Daughter  69  . High blood pressure Daughter 64  . Irritable bowel syndrome Daughter   . Heart failure Daughter 20    Medications: Patient's Medications  New Prescriptions   No medications on file  Previous Medications   ACETAMINOPHEN (TYLENOL) 500 MG TABLET    Take 500 mg by mouth daily at 12 noon. As needed   DILTIAZEM (CARDIZEM CD) 180 MG 24 HR CAPSULE    Take 1 capsule (180 mg total) by mouth daily.   HYDROCODONE-ACETAMINOPHEN (NORCO/VICODIN) 5-325 MG TABLET    Take 1 tablet by mouth every 6 (six) hours as needed for moderate pain.   METOPROLOL SUCCINATE (TOPROL-XL) 25 MG 24 HR TABLET    Take 12.5 mg by mouth daily.   TRAZODONE (DESYREL) 50 MG TABLET    Take 50 mg by mouth as needed.  Modified Medications   No medications on file  Discontinued Medications   LORAZEPAM (ATIVAN) 0.5 MG TABLET    Take 1 tablet (0.5 mg total) by mouth daily as needed for anxiety.   PRAVASTATIN (PRAVACHOL) 40 MG TABLET    Take 1 tablet (40 mg total) by mouth every evening.   SULFAMETHOXAZOLE-TRIMETHOPRIM (BACTRIM) 400-80 MG TABLET    Take 1 tablet by mouth 2 (two) times daily.    Physical Exam:  Vitals:   06/22/20 0957  BP: 132/74  Pulse: (!) 56  Temp: 98.6 F (37 C)  TempSrc: Temporal  SpO2: 96%  Weight: 130 lb 6.4 oz (59.1 kg)  Height: 5\' 1"  (1.549 m)   Body mass index is 24.64 kg/m. Wt Readings from Last 3 Encounters:  06/22/20 130 lb 6.4 oz (59.1 kg)  04/17/20 122 lb 6.4 oz (55.5 kg)  01/10/20 130 lb (59 kg)    Physical Exam Vitals reviewed.  Constitutional:      General: She is not in acute distress. HENT:     Head: Normocephalic.     Comments: Bilateral hearing aids    Right Ear: There is impacted cerumen.     Left Ear: There is impacted cerumen.     Nose: Nose normal.     Mouth/Throat:     Mouth: Mucous membranes are moist.     Pharynx: No posterior oropharyngeal erythema.  Eyes:     General:        Right eye: No discharge.        Left eye: No discharge.  Cardiovascular:      Rate and Rhythm: Normal rate and regular rhythm.     Pulses: Normal pulses.     Heart sounds: Normal heart sounds.  Pulmonary:     Effort: Pulmonary effort is normal. No respiratory distress.     Breath sounds: Normal breath sounds. No wheezing.  Abdominal:     General: Bowel sounds are normal. There is no distension.     Palpations: Abdomen is soft.     Tenderness: There is no abdominal tenderness.  Musculoskeletal:     Cervical back: Normal  range of motion.     Right lower leg: Edema present.     Left lower leg: Edema present.     Comments: Non-pitting  Lymphadenopathy:     Cervical: No cervical adenopathy.  Skin:    General: Skin is warm and dry.     Capillary Refill: Capillary refill takes less than 2 seconds.  Neurological:     General: No focal deficit present.     Mental Status: She is alert. Mental status is at baseline.     Motor: Weakness present.     Gait: Gait abnormal.     Comments: Rollator  Psychiatric:        Mood and Affect: Mood normal.        Behavior: Behavior normal.        Cognition and Memory: Memory is impaired.     Comments: Mainly nonverbal during encounter. She said a few short phrases.      Labs reviewed: Basic Metabolic Panel: Recent Labs    10/15/19 1522 10/24/19 0738 11/08/19 0844  NA 138 140 143  K 3.4* 3.6 3.6  CL 104 107 107  CO2 26 22 26   GLUCOSE 110 107* 83  BUN 13 10 12   CREATININE 1.04* 0.94 0.83  CALCIUM 9.0 9.4 9.3  TSH 1.48  --   --    Liver Function Tests: Recent Labs    07/15/19 0853 10/15/19 1522  AST 18 19  ALT 10 11  ALKPHOS 89  --   BILITOT 0.9 1.1  PROT 6.5 6.4  ALBUMIN 4.2  --    No results for input(s): LIPASE, AMYLASE in the last 8760 hours. No results for input(s): AMMONIA in the last 8760 hours. CBC: Recent Labs    07/15/19 0853 10/15/19 1522 10/24/19 0738  WBC 5.7 6.5 7.2  NEUTROABS 3.2 3,510  --   HGB 12.0 12.2 12.4  HCT 35.8 36.9 38.2  MCV 92 92.5 92.9  PLT 243 278 271   Lipid  Panel: Recent Labs    07/15/19 0853  CHOL 115  HDL 42  LDLCALC 55  TRIG 91  CHOLHDL 2.7   TSH: Recent Labs    10/15/19 1522  TSH 1.48   A1C: Lab Results  Component Value Date   HGBA1C 5.3 10/08/2018     Assessment/Plan 1. Bilateral hearing loss due to cerumen impaction - cannot visualize TM - cont use of debrox drops to both ears  - Ear Lavage  2. Dementia with behavioral disturbance, unspecified dementia type ([place]) - appears to mix up day/night time, repeating simple phrases - continues to ambulate and feed self - dependent on daughters for care - no recent behavioral outbursts or wandering  3. Lower extremity edema - non- pitting - she forgets to use recliner part of chair at home - cont leg elevation on couch - recommend trying compression stockings - cont low sodium diet  4. CKD (chronic kidney disease), symptom management only, stage 3 (moderate) (HCC) - continue to avoid nephrotoxic drugs like NSAIDS and dose adjust medications to be renally excreted - encourage hydration  5. Mixed hyperlipidemia - not on statin due to advanced age - cont low fat diet  6. Essential hypertension, benign - at goal < 150/90 - cont metoprolol  7. Counseling regarding advance care planning and goals of care - daughters HPOA - appears her dementia is beginning to require more care - options discussed with daughters   Total time: 38 minutes. Greater than 50% of total time spent  doing patient education and care coordination regarding goals of care, care setting options and health maintenance interventions.   Next appt: Visit date not found [place], [facility] [phone]

## 2020-06-22 NOTE — Patient Instructions (Signed)
Edema  Edema is when you have too much fluid in your body or under your skin. Edema may make your legs, feet, and ankles swell up. Swelling is also common in looser tissues, like around your eyes. This is a common condition. It gets more common as you get older. There are many possible causes of edema. Eating too much salt (sodium) and being on your feet or sitting for a long time can cause edema in your legs, feet, and ankles. Hot weather may make edema worse. Edema is usually painless. Your skin may look swollen or shiny. Follow these instructions at home:  Keep the swollen body part raised (elevated) above the level of your heart when you are sitting or lying down.  Do not sit still or stand for a long time.  Do not wear tight clothes. Do not wear garters on your upper legs.  Exercise your legs. This can help the swelling go down.  Wear elastic bandages or support stockings as told by your doctor.  Eat a low-salt (low-sodium) diet to reduce fluid as told by your doctor.  Depending on the cause of your swelling, you may need to limit how much fluid you drink (fluid restriction).  Take over-the-counter and prescription medicines only as told by your doctor. Contact a doctor if:  Treatment is not working.  You have heart, liver, or kidney disease and have symptoms of edema.  You have sudden and unexplained weight gain. Get help right away if:  You have shortness of breath or chest pain.  You cannot breathe when you lie down.  You have pain, redness, or warmth in the swollen areas.  You have heart, liver, or kidney disease and get edema all of a sudden.  You have a fever and your symptoms get worse all of a sudden. Summary  Edema is when you have too much fluid in your body or under your skin.  Edema may make your legs, feet, and ankles swell up. Swelling is also common in looser tissues, like around your eyes.  Raise (elevate) the swollen body part above the level of your  heart when you are sitting or lying down.  Follow your doctor's instructions about diet and how much fluid you can drink (fluid restriction). This information is not intended to replace advice given to you by your health care provider. Make sure you discuss any questions you have with your health care provider. Document Revised: 12/01/2019 Document Reviewed: 12/01/2019 Elsevier Patient Education  2021 Elsevier Inc.  

## 2020-07-25 ENCOUNTER — Telehealth: Payer: Self-pay

## 2020-07-25 NOTE — Telephone Encounter (Signed)
Returned call to patient's daughter. Daughter requested to schedule visit with Dr. Mariea Clonts.Visit scheduled for 6/30 @ 11:30am

## 2020-08-28 ENCOUNTER — Encounter: Payer: Self-pay | Admitting: Orthopedic Surgery

## 2020-08-28 MED ORDER — METOPROLOL SUCCINATE ER 25 MG PO TB24: 12.5000 mg | ORAL_TABLET | Freq: Every day | ORAL | 1 refills | Status: AC

## 2020-09-20 ENCOUNTER — Telehealth: Payer: Medicare Other | Admitting: *Deleted

## 2020-09-20 NOTE — Telephone Encounter (Signed)
Received fax from [place], daughter, for a [place] to Assisted Living at [facility].   Placed in Amy's folder to review and fill out.   To be Faxed to [phone] or mail to: Ilda Basset [address] [place] [address]

## 2020-09-21 ENCOUNTER — Encounter: Payer: Self-pay | Admitting: Orthopedic Surgery

## 2020-09-21 DIAGNOSIS — Z029 Encounter for administrative examinations, unspecified: Secondary | ICD-10-CM

## 2020-09-21 NOTE — Telephone Encounter (Signed)
Forms completed. [name] has contacted patient's daughter, [name] that they are ready for pick up.  Attached copy of letter and X-Ray.   Sent copy for scanning.  Paperwork left up front for pick up.

## 2020-10-30 ENCOUNTER — Other Ambulatory Visit: Payer: Self-pay | Admitting: Family

## 2020-10-30 ENCOUNTER — Telehealth: Payer: Self-pay

## 2020-10-30 DIAGNOSIS — M542 Cervicalgia: Secondary | ICD-10-CM

## 2020-10-30 MED ORDER — HYDROCODONE-ACETAMINOPHEN 5-325 MG PO TABS: 1.0000 | ORAL_TABLET | Freq: Four times a day (QID) | ORAL | 0 refills | Status: AC | PRN

## 2020-10-30 NOTE — Telephone Encounter (Signed)
I called patient daughter "Renita Papa" and she states that mother isn't taking Hydrocodone '5mg'$ -'325mg'$ . She states that mother does need medication refilled because she's having pain in her neck again. Patient daughter states that medication does help mother with pain. Message pend and sent to Marlowe Sax, NP due to PCP Yvonna Alanis, NP being out of office.

## 2020-10-30 NOTE — Telephone Encounter (Signed)
Will refill Hydrocodone as requested.

## 2020-11-02 DIAGNOSIS — R451 Restlessness and agitation: Secondary | ICD-10-CM | POA: Diagnosis not present

## 2020-11-02 DIAGNOSIS — I10 Essential (primary) hypertension: Secondary | ICD-10-CM | POA: Diagnosis not present

## 2020-11-02 DIAGNOSIS — M159 Polyosteoarthritis, unspecified: Secondary | ICD-10-CM | POA: Diagnosis not present

## 2020-11-02 DIAGNOSIS — G309 Alzheimer's disease, unspecified: Secondary | ICD-10-CM | POA: Diagnosis not present

## 2020-11-02 DIAGNOSIS — N183 Chronic kidney disease, stage 3 unspecified: Secondary | ICD-10-CM | POA: Diagnosis not present

## 2020-11-02 DIAGNOSIS — R2681 Unsteadiness on feet: Secondary | ICD-10-CM | POA: Diagnosis not present

## 2020-11-07 DIAGNOSIS — E782 Mixed hyperlipidemia: Secondary | ICD-10-CM | POA: Diagnosis not present

## 2020-11-07 DIAGNOSIS — G309 Alzheimer's disease, unspecified: Secondary | ICD-10-CM | POA: Diagnosis not present

## 2020-11-07 DIAGNOSIS — M159 Polyosteoarthritis, unspecified: Secondary | ICD-10-CM | POA: Diagnosis not present

## 2020-11-07 DIAGNOSIS — N183 Chronic kidney disease, stage 3 unspecified: Secondary | ICD-10-CM | POA: Diagnosis not present

## 2020-11-08 DIAGNOSIS — B372 Candidiasis of skin and nail: Secondary | ICD-10-CM | POA: Diagnosis not present

## 2020-11-08 DIAGNOSIS — I10 Essential (primary) hypertension: Secondary | ICD-10-CM | POA: Diagnosis not present

## 2020-11-08 DIAGNOSIS — G309 Alzheimer's disease, unspecified: Secondary | ICD-10-CM | POA: Diagnosis not present

## 2020-11-10 ENCOUNTER — Encounter: Payer: Self-pay | Admitting: Orthopedic Surgery

## 2020-11-16 DIAGNOSIS — B372 Candidiasis of skin and nail: Secondary | ICD-10-CM | POA: Diagnosis not present

## 2020-11-16 DIAGNOSIS — G309 Alzheimer's disease, unspecified: Secondary | ICD-10-CM | POA: Diagnosis not present

## 2020-11-28 DIAGNOSIS — I10 Essential (primary) hypertension: Secondary | ICD-10-CM | POA: Diagnosis not present

## 2020-11-28 DIAGNOSIS — E538 Deficiency of other specified B group vitamins: Secondary | ICD-10-CM | POA: Diagnosis not present

## 2020-11-28 DIAGNOSIS — R2681 Unsteadiness on feet: Secondary | ICD-10-CM | POA: Diagnosis not present

## 2020-11-28 DIAGNOSIS — G309 Alzheimer's disease, unspecified: Secondary | ICD-10-CM | POA: Diagnosis not present

## 2020-12-08 DIAGNOSIS — I129 Hypertensive chronic kidney disease with stage 1 through stage 4 chronic kidney disease, or unspecified chronic kidney disease: Secondary | ICD-10-CM | POA: Diagnosis not present

## 2020-12-08 DIAGNOSIS — K5909 Other constipation: Secondary | ICD-10-CM | POA: Diagnosis not present

## 2020-12-08 DIAGNOSIS — K449 Diaphragmatic hernia without obstruction or gangrene: Secondary | ICD-10-CM | POA: Diagnosis not present

## 2020-12-08 DIAGNOSIS — N183 Chronic kidney disease, stage 3 unspecified: Secondary | ICD-10-CM | POA: Diagnosis not present

## 2020-12-08 DIAGNOSIS — I499 Cardiac arrhythmia, unspecified: Secondary | ICD-10-CM | POA: Diagnosis not present

## 2020-12-08 DIAGNOSIS — K219 Gastro-esophageal reflux disease without esophagitis: Secondary | ICD-10-CM | POA: Diagnosis not present

## 2020-12-08 DIAGNOSIS — M150 Primary generalized (osteo)arthritis: Secondary | ICD-10-CM | POA: Diagnosis not present

## 2020-12-08 DIAGNOSIS — F0394 Unspecified dementia, unspecified severity, with anxiety: Secondary | ICD-10-CM | POA: Diagnosis not present

## 2020-12-08 DIAGNOSIS — H9193 Unspecified hearing loss, bilateral: Secondary | ICD-10-CM | POA: Diagnosis not present

## 2020-12-08 DIAGNOSIS — K222 Esophageal obstruction: Secondary | ICD-10-CM | POA: Diagnosis not present

## 2020-12-08 DIAGNOSIS — E538 Deficiency of other specified B group vitamins: Secondary | ICD-10-CM | POA: Diagnosis not present

## 2020-12-08 DIAGNOSIS — E782 Mixed hyperlipidemia: Secondary | ICD-10-CM | POA: Diagnosis not present

## 2020-12-08 DIAGNOSIS — M810 Age-related osteoporosis without current pathological fracture: Secondary | ICD-10-CM | POA: Diagnosis not present

## 2020-12-08 DIAGNOSIS — D649 Anemia, unspecified: Secondary | ICD-10-CM | POA: Diagnosis not present

## 2020-12-08 DIAGNOSIS — Z87440 Personal history of urinary (tract) infections: Secondary | ICD-10-CM | POA: Diagnosis not present

## 2020-12-12 DIAGNOSIS — K219 Gastro-esophageal reflux disease without esophagitis: Secondary | ICD-10-CM | POA: Diagnosis not present

## 2020-12-12 DIAGNOSIS — Z87440 Personal history of urinary (tract) infections: Secondary | ICD-10-CM | POA: Diagnosis not present

## 2020-12-12 DIAGNOSIS — K222 Esophageal obstruction: Secondary | ICD-10-CM | POA: Diagnosis not present

## 2020-12-12 DIAGNOSIS — N183 Chronic kidney disease, stage 3 unspecified: Secondary | ICD-10-CM | POA: Diagnosis not present

## 2020-12-12 DIAGNOSIS — D649 Anemia, unspecified: Secondary | ICD-10-CM | POA: Diagnosis not present

## 2020-12-12 DIAGNOSIS — K5909 Other constipation: Secondary | ICD-10-CM | POA: Diagnosis not present

## 2020-12-12 DIAGNOSIS — E538 Deficiency of other specified B group vitamins: Secondary | ICD-10-CM | POA: Diagnosis not present

## 2020-12-12 DIAGNOSIS — K449 Diaphragmatic hernia without obstruction or gangrene: Secondary | ICD-10-CM | POA: Diagnosis not present

## 2020-12-12 DIAGNOSIS — I129 Hypertensive chronic kidney disease with stage 1 through stage 4 chronic kidney disease, or unspecified chronic kidney disease: Secondary | ICD-10-CM | POA: Diagnosis not present

## 2020-12-12 DIAGNOSIS — F0394 Unspecified dementia, unspecified severity, with anxiety: Secondary | ICD-10-CM | POA: Diagnosis not present

## 2020-12-12 DIAGNOSIS — I499 Cardiac arrhythmia, unspecified: Secondary | ICD-10-CM | POA: Diagnosis not present

## 2020-12-12 DIAGNOSIS — M150 Primary generalized (osteo)arthritis: Secondary | ICD-10-CM | POA: Diagnosis not present

## 2020-12-12 DIAGNOSIS — M810 Age-related osteoporosis without current pathological fracture: Secondary | ICD-10-CM | POA: Diagnosis not present

## 2020-12-12 DIAGNOSIS — E782 Mixed hyperlipidemia: Secondary | ICD-10-CM | POA: Diagnosis not present

## 2020-12-12 DIAGNOSIS — H9193 Unspecified hearing loss, bilateral: Secondary | ICD-10-CM | POA: Diagnosis not present

## 2020-12-19 DIAGNOSIS — I499 Cardiac arrhythmia, unspecified: Secondary | ICD-10-CM | POA: Diagnosis not present

## 2020-12-19 DIAGNOSIS — K219 Gastro-esophageal reflux disease without esophagitis: Secondary | ICD-10-CM | POA: Diagnosis not present

## 2020-12-19 DIAGNOSIS — E538 Deficiency of other specified B group vitamins: Secondary | ICD-10-CM | POA: Diagnosis not present

## 2020-12-19 DIAGNOSIS — E782 Mixed hyperlipidemia: Secondary | ICD-10-CM | POA: Diagnosis not present

## 2020-12-19 DIAGNOSIS — K5909 Other constipation: Secondary | ICD-10-CM | POA: Diagnosis not present

## 2020-12-19 DIAGNOSIS — D649 Anemia, unspecified: Secondary | ICD-10-CM | POA: Diagnosis not present

## 2020-12-19 DIAGNOSIS — H9193 Unspecified hearing loss, bilateral: Secondary | ICD-10-CM | POA: Diagnosis not present

## 2020-12-19 DIAGNOSIS — M810 Age-related osteoporosis without current pathological fracture: Secondary | ICD-10-CM | POA: Diagnosis not present

## 2020-12-19 DIAGNOSIS — K449 Diaphragmatic hernia without obstruction or gangrene: Secondary | ICD-10-CM | POA: Diagnosis not present

## 2020-12-19 DIAGNOSIS — F0394 Unspecified dementia, unspecified severity, with anxiety: Secondary | ICD-10-CM | POA: Diagnosis not present

## 2020-12-19 DIAGNOSIS — K222 Esophageal obstruction: Secondary | ICD-10-CM | POA: Diagnosis not present

## 2020-12-19 DIAGNOSIS — N183 Chronic kidney disease, stage 3 unspecified: Secondary | ICD-10-CM | POA: Diagnosis not present

## 2020-12-19 DIAGNOSIS — Z87440 Personal history of urinary (tract) infections: Secondary | ICD-10-CM | POA: Diagnosis not present

## 2020-12-19 DIAGNOSIS — M150 Primary generalized (osteo)arthritis: Secondary | ICD-10-CM | POA: Diagnosis not present

## 2020-12-19 DIAGNOSIS — I129 Hypertensive chronic kidney disease with stage 1 through stage 4 chronic kidney disease, or unspecified chronic kidney disease: Secondary | ICD-10-CM | POA: Diagnosis not present

## 2020-12-22 DIAGNOSIS — E538 Deficiency of other specified B group vitamins: Secondary | ICD-10-CM | POA: Diagnosis not present

## 2020-12-22 DIAGNOSIS — K219 Gastro-esophageal reflux disease without esophagitis: Secondary | ICD-10-CM | POA: Diagnosis not present

## 2020-12-22 DIAGNOSIS — D649 Anemia, unspecified: Secondary | ICD-10-CM | POA: Diagnosis not present

## 2020-12-22 DIAGNOSIS — N183 Chronic kidney disease, stage 3 unspecified: Secondary | ICD-10-CM | POA: Diagnosis not present

## 2020-12-22 DIAGNOSIS — K5909 Other constipation: Secondary | ICD-10-CM | POA: Diagnosis not present

## 2020-12-22 DIAGNOSIS — M810 Age-related osteoporosis without current pathological fracture: Secondary | ICD-10-CM | POA: Diagnosis not present

## 2020-12-22 DIAGNOSIS — K449 Diaphragmatic hernia without obstruction or gangrene: Secondary | ICD-10-CM | POA: Diagnosis not present

## 2020-12-22 DIAGNOSIS — M150 Primary generalized (osteo)arthritis: Secondary | ICD-10-CM | POA: Diagnosis not present

## 2020-12-22 DIAGNOSIS — H9193 Unspecified hearing loss, bilateral: Secondary | ICD-10-CM | POA: Diagnosis not present

## 2020-12-22 DIAGNOSIS — F0394 Unspecified dementia, unspecified severity, with anxiety: Secondary | ICD-10-CM | POA: Diagnosis not present

## 2020-12-22 DIAGNOSIS — I499 Cardiac arrhythmia, unspecified: Secondary | ICD-10-CM | POA: Diagnosis not present

## 2020-12-22 DIAGNOSIS — K222 Esophageal obstruction: Secondary | ICD-10-CM | POA: Diagnosis not present

## 2020-12-22 DIAGNOSIS — I129 Hypertensive chronic kidney disease with stage 1 through stage 4 chronic kidney disease, or unspecified chronic kidney disease: Secondary | ICD-10-CM | POA: Diagnosis not present

## 2020-12-22 DIAGNOSIS — E782 Mixed hyperlipidemia: Secondary | ICD-10-CM | POA: Diagnosis not present

## 2020-12-22 DIAGNOSIS — Z87440 Personal history of urinary (tract) infections: Secondary | ICD-10-CM | POA: Diagnosis not present

## 2020-12-26 DIAGNOSIS — K222 Esophageal obstruction: Secondary | ICD-10-CM | POA: Diagnosis not present

## 2020-12-26 DIAGNOSIS — K449 Diaphragmatic hernia without obstruction or gangrene: Secondary | ICD-10-CM | POA: Diagnosis not present

## 2020-12-26 DIAGNOSIS — M150 Primary generalized (osteo)arthritis: Secondary | ICD-10-CM | POA: Diagnosis not present

## 2020-12-26 DIAGNOSIS — I129 Hypertensive chronic kidney disease with stage 1 through stage 4 chronic kidney disease, or unspecified chronic kidney disease: Secondary | ICD-10-CM | POA: Diagnosis not present

## 2020-12-26 DIAGNOSIS — K5909 Other constipation: Secondary | ICD-10-CM | POA: Diagnosis not present

## 2020-12-26 DIAGNOSIS — K219 Gastro-esophageal reflux disease without esophagitis: Secondary | ICD-10-CM | POA: Diagnosis not present

## 2020-12-26 DIAGNOSIS — D649 Anemia, unspecified: Secondary | ICD-10-CM | POA: Diagnosis not present

## 2020-12-26 DIAGNOSIS — N183 Chronic kidney disease, stage 3 unspecified: Secondary | ICD-10-CM | POA: Diagnosis not present

## 2020-12-26 DIAGNOSIS — Z87440 Personal history of urinary (tract) infections: Secondary | ICD-10-CM | POA: Diagnosis not present

## 2020-12-26 DIAGNOSIS — E782 Mixed hyperlipidemia: Secondary | ICD-10-CM | POA: Diagnosis not present

## 2020-12-26 DIAGNOSIS — E538 Deficiency of other specified B group vitamins: Secondary | ICD-10-CM | POA: Diagnosis not present

## 2020-12-26 DIAGNOSIS — F0394 Unspecified dementia, unspecified severity, with anxiety: Secondary | ICD-10-CM | POA: Diagnosis not present

## 2020-12-26 DIAGNOSIS — H9193 Unspecified hearing loss, bilateral: Secondary | ICD-10-CM | POA: Diagnosis not present

## 2020-12-26 DIAGNOSIS — I499 Cardiac arrhythmia, unspecified: Secondary | ICD-10-CM | POA: Diagnosis not present

## 2020-12-26 DIAGNOSIS — M810 Age-related osteoporosis without current pathological fracture: Secondary | ICD-10-CM | POA: Diagnosis not present

## 2020-12-27 DIAGNOSIS — M810 Age-related osteoporosis without current pathological fracture: Secondary | ICD-10-CM | POA: Diagnosis not present

## 2020-12-27 DIAGNOSIS — E782 Mixed hyperlipidemia: Secondary | ICD-10-CM | POA: Diagnosis not present

## 2020-12-27 DIAGNOSIS — K5909 Other constipation: Secondary | ICD-10-CM | POA: Diagnosis not present

## 2020-12-27 DIAGNOSIS — I129 Hypertensive chronic kidney disease with stage 1 through stage 4 chronic kidney disease, or unspecified chronic kidney disease: Secondary | ICD-10-CM | POA: Diagnosis not present

## 2020-12-27 DIAGNOSIS — K449 Diaphragmatic hernia without obstruction or gangrene: Secondary | ICD-10-CM | POA: Diagnosis not present

## 2020-12-27 DIAGNOSIS — K219 Gastro-esophageal reflux disease without esophagitis: Secondary | ICD-10-CM | POA: Diagnosis not present

## 2020-12-27 DIAGNOSIS — Z87440 Personal history of urinary (tract) infections: Secondary | ICD-10-CM | POA: Diagnosis not present

## 2020-12-27 DIAGNOSIS — F0394 Unspecified dementia, unspecified severity, with anxiety: Secondary | ICD-10-CM | POA: Diagnosis not present

## 2020-12-27 DIAGNOSIS — N183 Chronic kidney disease, stage 3 unspecified: Secondary | ICD-10-CM | POA: Diagnosis not present

## 2020-12-27 DIAGNOSIS — M150 Primary generalized (osteo)arthritis: Secondary | ICD-10-CM | POA: Diagnosis not present

## 2020-12-27 DIAGNOSIS — D649 Anemia, unspecified: Secondary | ICD-10-CM | POA: Diagnosis not present

## 2020-12-27 DIAGNOSIS — E538 Deficiency of other specified B group vitamins: Secondary | ICD-10-CM | POA: Diagnosis not present

## 2020-12-27 DIAGNOSIS — K222 Esophageal obstruction: Secondary | ICD-10-CM | POA: Diagnosis not present

## 2020-12-27 DIAGNOSIS — H9193 Unspecified hearing loss, bilateral: Secondary | ICD-10-CM | POA: Diagnosis not present

## 2020-12-27 DIAGNOSIS — I499 Cardiac arrhythmia, unspecified: Secondary | ICD-10-CM | POA: Diagnosis not present

## 2021-01-02 DIAGNOSIS — K222 Esophageal obstruction: Secondary | ICD-10-CM | POA: Diagnosis not present

## 2021-01-02 DIAGNOSIS — I499 Cardiac arrhythmia, unspecified: Secondary | ICD-10-CM | POA: Diagnosis not present

## 2021-01-02 DIAGNOSIS — F0394 Unspecified dementia, unspecified severity, with anxiety: Secondary | ICD-10-CM | POA: Diagnosis not present

## 2021-01-02 DIAGNOSIS — R2689 Other abnormalities of gait and mobility: Secondary | ICD-10-CM | POA: Diagnosis not present

## 2021-01-02 DIAGNOSIS — D649 Anemia, unspecified: Secondary | ICD-10-CM | POA: Diagnosis not present

## 2021-01-02 DIAGNOSIS — K219 Gastro-esophageal reflux disease without esophagitis: Secondary | ICD-10-CM | POA: Diagnosis not present

## 2021-01-02 DIAGNOSIS — E782 Mixed hyperlipidemia: Secondary | ICD-10-CM | POA: Diagnosis not present

## 2021-01-02 DIAGNOSIS — K5909 Other constipation: Secondary | ICD-10-CM | POA: Diagnosis not present

## 2021-01-02 DIAGNOSIS — I129 Hypertensive chronic kidney disease with stage 1 through stage 4 chronic kidney disease, or unspecified chronic kidney disease: Secondary | ICD-10-CM | POA: Diagnosis not present

## 2021-01-02 DIAGNOSIS — N183 Chronic kidney disease, stage 3 unspecified: Secondary | ICD-10-CM | POA: Diagnosis not present

## 2021-01-02 DIAGNOSIS — I498 Other specified cardiac arrhythmias: Secondary | ICD-10-CM | POA: Diagnosis not present

## 2021-01-02 DIAGNOSIS — E538 Deficiency of other specified B group vitamins: Secondary | ICD-10-CM | POA: Diagnosis not present

## 2021-01-02 DIAGNOSIS — M150 Primary generalized (osteo)arthritis: Secondary | ICD-10-CM | POA: Diagnosis not present

## 2021-01-02 DIAGNOSIS — G309 Alzheimer's disease, unspecified: Secondary | ICD-10-CM | POA: Diagnosis not present

## 2021-01-02 DIAGNOSIS — Z87440 Personal history of urinary (tract) infections: Secondary | ICD-10-CM | POA: Diagnosis not present

## 2021-01-02 DIAGNOSIS — K449 Diaphragmatic hernia without obstruction or gangrene: Secondary | ICD-10-CM | POA: Diagnosis not present

## 2021-01-02 DIAGNOSIS — M810 Age-related osteoporosis without current pathological fracture: Secondary | ICD-10-CM | POA: Diagnosis not present

## 2021-01-02 DIAGNOSIS — I10 Essential (primary) hypertension: Secondary | ICD-10-CM | POA: Diagnosis not present

## 2021-01-02 DIAGNOSIS — F03B4 Unspecified dementia, moderate, with anxiety: Secondary | ICD-10-CM | POA: Diagnosis not present

## 2021-01-02 DIAGNOSIS — H9193 Unspecified hearing loss, bilateral: Secondary | ICD-10-CM | POA: Diagnosis not present

## 2021-01-03 DIAGNOSIS — N183 Chronic kidney disease, stage 3 unspecified: Secondary | ICD-10-CM | POA: Diagnosis not present

## 2021-01-03 DIAGNOSIS — H9193 Unspecified hearing loss, bilateral: Secondary | ICD-10-CM | POA: Diagnosis not present

## 2021-01-03 DIAGNOSIS — I129 Hypertensive chronic kidney disease with stage 1 through stage 4 chronic kidney disease, or unspecified chronic kidney disease: Secondary | ICD-10-CM | POA: Diagnosis not present

## 2021-01-10 DIAGNOSIS — R1312 Dysphagia, oropharyngeal phase: Secondary | ICD-10-CM | POA: Diagnosis not present

## 2021-01-10 DIAGNOSIS — F03B4 Unspecified dementia, moderate, with anxiety: Secondary | ICD-10-CM | POA: Diagnosis not present

## 2021-01-10 DIAGNOSIS — M170 Bilateral primary osteoarthritis of knee: Secondary | ICD-10-CM | POA: Diagnosis not present

## 2021-01-10 DIAGNOSIS — N183 Chronic kidney disease, stage 3 unspecified: Secondary | ICD-10-CM | POA: Diagnosis not present

## 2021-01-10 DIAGNOSIS — I129 Hypertensive chronic kidney disease with stage 1 through stage 4 chronic kidney disease, or unspecified chronic kidney disease: Secondary | ICD-10-CM | POA: Diagnosis not present

## 2021-03-27 ENCOUNTER — Encounter: Payer: Medicare Other | Admitting: Family

## 2021-03-27 DIAGNOSIS — R1312 Dysphagia, oropharyngeal phase: Secondary | ICD-10-CM | POA: Diagnosis not present

## 2021-03-27 DIAGNOSIS — R4701 Aphasia: Secondary | ICD-10-CM | POA: Diagnosis not present

## 2021-03-29 ENCOUNTER — Encounter: Payer: Medicare Other | Admitting: Orthopedic Surgery

## 2021-03-29 DIAGNOSIS — Z8719 Personal history of other diseases of the digestive system: Secondary | ICD-10-CM | POA: Diagnosis not present

## 2021-03-29 DIAGNOSIS — F32A Depression, unspecified: Secondary | ICD-10-CM | POA: Diagnosis not present

## 2021-03-29 DIAGNOSIS — Z0189 Encounter for other specified special examinations: Secondary | ICD-10-CM | POA: Diagnosis not present

## 2021-03-29 DIAGNOSIS — E785 Hyperlipidemia, unspecified: Secondary | ICD-10-CM | POA: Diagnosis not present

## 2021-03-29 DIAGNOSIS — N183 Chronic kidney disease, stage 3 unspecified: Secondary | ICD-10-CM | POA: Diagnosis not present

## 2021-03-29 DIAGNOSIS — G4700 Insomnia, unspecified: Secondary | ICD-10-CM | POA: Diagnosis not present

## 2021-03-29 DIAGNOSIS — I4891 Unspecified atrial fibrillation: Secondary | ICD-10-CM | POA: Diagnosis not present

## 2021-03-29 DIAGNOSIS — M1990 Unspecified osteoarthritis, unspecified site: Secondary | ICD-10-CM | POA: Diagnosis not present

## 2021-03-29 DIAGNOSIS — I10 Essential (primary) hypertension: Secondary | ICD-10-CM | POA: Diagnosis not present

## 2021-03-30 DIAGNOSIS — R4701 Aphasia: Secondary | ICD-10-CM | POA: Diagnosis not present

## 2021-03-30 DIAGNOSIS — R1312 Dysphagia, oropharyngeal phase: Secondary | ICD-10-CM | POA: Diagnosis not present

## 2021-04-02 DIAGNOSIS — R1312 Dysphagia, oropharyngeal phase: Secondary | ICD-10-CM | POA: Diagnosis not present

## 2021-04-02 DIAGNOSIS — R4701 Aphasia: Secondary | ICD-10-CM | POA: Diagnosis not present

## 2021-04-03 DIAGNOSIS — Z9183 Wandering in diseases classified elsewhere: Secondary | ICD-10-CM | POA: Diagnosis not present

## 2021-04-03 DIAGNOSIS — I129 Hypertensive chronic kidney disease with stage 1 through stage 4 chronic kidney disease, or unspecified chronic kidney disease: Secondary | ICD-10-CM | POA: Diagnosis not present

## 2021-04-03 DIAGNOSIS — F32A Depression, unspecified: Secondary | ICD-10-CM | POA: Diagnosis not present

## 2021-04-03 DIAGNOSIS — Z9181 History of falling: Secondary | ICD-10-CM | POA: Diagnosis not present

## 2021-04-03 DIAGNOSIS — I872 Venous insufficiency (chronic) (peripheral): Secondary | ICD-10-CM | POA: Diagnosis not present

## 2021-04-03 DIAGNOSIS — I4891 Unspecified atrial fibrillation: Secondary | ICD-10-CM | POA: Diagnosis not present

## 2021-04-03 DIAGNOSIS — M19011 Primary osteoarthritis, right shoulder: Secondary | ICD-10-CM | POA: Diagnosis not present

## 2021-04-03 DIAGNOSIS — F02818 Dementia in other diseases classified elsewhere, unspecified severity, with other behavioral disturbance: Secondary | ICD-10-CM | POA: Diagnosis not present

## 2021-04-03 DIAGNOSIS — K449 Diaphragmatic hernia without obstruction or gangrene: Secondary | ICD-10-CM | POA: Diagnosis not present

## 2021-04-03 DIAGNOSIS — H9190 Unspecified hearing loss, unspecified ear: Secondary | ICD-10-CM | POA: Diagnosis not present

## 2021-04-03 DIAGNOSIS — M19012 Primary osteoarthritis, left shoulder: Secondary | ICD-10-CM | POA: Diagnosis not present

## 2021-04-03 DIAGNOSIS — M810 Age-related osteoporosis without current pathological fracture: Secondary | ICD-10-CM | POA: Diagnosis not present

## 2021-04-03 DIAGNOSIS — G4700 Insomnia, unspecified: Secondary | ICD-10-CM | POA: Diagnosis not present

## 2021-04-03 DIAGNOSIS — E782 Mixed hyperlipidemia: Secondary | ICD-10-CM | POA: Diagnosis not present

## 2021-04-03 DIAGNOSIS — N183 Chronic kidney disease, stage 3 unspecified: Secondary | ICD-10-CM | POA: Diagnosis not present

## 2021-04-03 DIAGNOSIS — K222 Esophageal obstruction: Secondary | ICD-10-CM | POA: Diagnosis not present

## 2021-04-03 DIAGNOSIS — E538 Deficiency of other specified B group vitamins: Secondary | ICD-10-CM | POA: Diagnosis not present

## 2021-04-06 DIAGNOSIS — M19012 Primary osteoarthritis, left shoulder: Secondary | ICD-10-CM | POA: Diagnosis not present

## 2021-04-06 DIAGNOSIS — H9190 Unspecified hearing loss, unspecified ear: Secondary | ICD-10-CM | POA: Diagnosis not present

## 2021-04-06 DIAGNOSIS — M810 Age-related osteoporosis without current pathological fracture: Secondary | ICD-10-CM | POA: Diagnosis not present

## 2021-04-06 DIAGNOSIS — Z9181 History of falling: Secondary | ICD-10-CM | POA: Diagnosis not present

## 2021-04-06 DIAGNOSIS — Z9183 Wandering in diseases classified elsewhere: Secondary | ICD-10-CM | POA: Diagnosis not present

## 2021-04-06 DIAGNOSIS — F32A Depression, unspecified: Secondary | ICD-10-CM | POA: Diagnosis not present

## 2021-04-06 DIAGNOSIS — K449 Diaphragmatic hernia without obstruction or gangrene: Secondary | ICD-10-CM | POA: Diagnosis not present

## 2021-04-06 DIAGNOSIS — E782 Mixed hyperlipidemia: Secondary | ICD-10-CM | POA: Diagnosis not present

## 2021-04-06 DIAGNOSIS — N183 Chronic kidney disease, stage 3 unspecified: Secondary | ICD-10-CM | POA: Diagnosis not present

## 2021-04-06 DIAGNOSIS — K222 Esophageal obstruction: Secondary | ICD-10-CM | POA: Diagnosis not present

## 2021-04-06 DIAGNOSIS — I4891 Unspecified atrial fibrillation: Secondary | ICD-10-CM | POA: Diagnosis not present

## 2021-04-06 DIAGNOSIS — E538 Deficiency of other specified B group vitamins: Secondary | ICD-10-CM | POA: Diagnosis not present

## 2021-04-06 DIAGNOSIS — F02818 Dementia in other diseases classified elsewhere, unspecified severity, with other behavioral disturbance: Secondary | ICD-10-CM | POA: Diagnosis not present

## 2021-04-06 DIAGNOSIS — I129 Hypertensive chronic kidney disease with stage 1 through stage 4 chronic kidney disease, or unspecified chronic kidney disease: Secondary | ICD-10-CM | POA: Diagnosis not present

## 2021-04-06 DIAGNOSIS — I872 Venous insufficiency (chronic) (peripheral): Secondary | ICD-10-CM | POA: Diagnosis not present

## 2021-04-06 DIAGNOSIS — M19011 Primary osteoarthritis, right shoulder: Secondary | ICD-10-CM | POA: Diagnosis not present

## 2021-04-06 DIAGNOSIS — G4700 Insomnia, unspecified: Secondary | ICD-10-CM | POA: Diagnosis not present

## 2021-04-10 DIAGNOSIS — K222 Esophageal obstruction: Secondary | ICD-10-CM | POA: Diagnosis not present

## 2021-04-10 DIAGNOSIS — I872 Venous insufficiency (chronic) (peripheral): Secondary | ICD-10-CM | POA: Diagnosis not present

## 2021-04-10 DIAGNOSIS — Z9183 Wandering in diseases classified elsewhere: Secondary | ICD-10-CM | POA: Diagnosis not present

## 2021-04-10 DIAGNOSIS — M19012 Primary osteoarthritis, left shoulder: Secondary | ICD-10-CM | POA: Diagnosis not present

## 2021-04-10 DIAGNOSIS — G4700 Insomnia, unspecified: Secondary | ICD-10-CM | POA: Diagnosis not present

## 2021-04-10 DIAGNOSIS — F02818 Dementia in other diseases classified elsewhere, unspecified severity, with other behavioral disturbance: Secondary | ICD-10-CM | POA: Diagnosis not present

## 2021-04-10 DIAGNOSIS — I4891 Unspecified atrial fibrillation: Secondary | ICD-10-CM | POA: Diagnosis not present

## 2021-04-10 DIAGNOSIS — K449 Diaphragmatic hernia without obstruction or gangrene: Secondary | ICD-10-CM | POA: Diagnosis not present

## 2021-04-10 DIAGNOSIS — E538 Deficiency of other specified B group vitamins: Secondary | ICD-10-CM | POA: Diagnosis not present

## 2021-04-10 DIAGNOSIS — M19011 Primary osteoarthritis, right shoulder: Secondary | ICD-10-CM | POA: Diagnosis not present

## 2021-04-10 DIAGNOSIS — I129 Hypertensive chronic kidney disease with stage 1 through stage 4 chronic kidney disease, or unspecified chronic kidney disease: Secondary | ICD-10-CM | POA: Diagnosis not present

## 2021-04-10 DIAGNOSIS — E782 Mixed hyperlipidemia: Secondary | ICD-10-CM | POA: Diagnosis not present

## 2021-04-10 DIAGNOSIS — F32A Depression, unspecified: Secondary | ICD-10-CM | POA: Diagnosis not present

## 2021-04-10 DIAGNOSIS — N183 Chronic kidney disease, stage 3 unspecified: Secondary | ICD-10-CM | POA: Diagnosis not present

## 2021-04-10 DIAGNOSIS — M810 Age-related osteoporosis without current pathological fracture: Secondary | ICD-10-CM | POA: Diagnosis not present

## 2021-04-10 DIAGNOSIS — H9190 Unspecified hearing loss, unspecified ear: Secondary | ICD-10-CM | POA: Diagnosis not present

## 2021-04-10 DIAGNOSIS — Z9181 History of falling: Secondary | ICD-10-CM | POA: Diagnosis not present

## 2021-04-12 DIAGNOSIS — Z9183 Wandering in diseases classified elsewhere: Secondary | ICD-10-CM | POA: Diagnosis not present

## 2021-04-12 DIAGNOSIS — Z9181 History of falling: Secondary | ICD-10-CM | POA: Diagnosis not present

## 2021-04-12 DIAGNOSIS — K449 Diaphragmatic hernia without obstruction or gangrene: Secondary | ICD-10-CM | POA: Diagnosis not present

## 2021-04-12 DIAGNOSIS — F32A Depression, unspecified: Secondary | ICD-10-CM | POA: Diagnosis not present

## 2021-04-12 DIAGNOSIS — E782 Mixed hyperlipidemia: Secondary | ICD-10-CM | POA: Diagnosis not present

## 2021-04-12 DIAGNOSIS — I4891 Unspecified atrial fibrillation: Secondary | ICD-10-CM | POA: Diagnosis not present

## 2021-04-12 DIAGNOSIS — M19011 Primary osteoarthritis, right shoulder: Secondary | ICD-10-CM | POA: Diagnosis not present

## 2021-04-12 DIAGNOSIS — F02818 Dementia in other diseases classified elsewhere, unspecified severity, with other behavioral disturbance: Secondary | ICD-10-CM | POA: Diagnosis not present

## 2021-04-12 DIAGNOSIS — I872 Venous insufficiency (chronic) (peripheral): Secondary | ICD-10-CM | POA: Diagnosis not present

## 2021-04-12 DIAGNOSIS — K222 Esophageal obstruction: Secondary | ICD-10-CM | POA: Diagnosis not present

## 2021-04-12 DIAGNOSIS — N183 Chronic kidney disease, stage 3 unspecified: Secondary | ICD-10-CM | POA: Diagnosis not present

## 2021-04-12 DIAGNOSIS — H9190 Unspecified hearing loss, unspecified ear: Secondary | ICD-10-CM | POA: Diagnosis not present

## 2021-04-12 DIAGNOSIS — M19012 Primary osteoarthritis, left shoulder: Secondary | ICD-10-CM | POA: Diagnosis not present

## 2021-04-12 DIAGNOSIS — I129 Hypertensive chronic kidney disease with stage 1 through stage 4 chronic kidney disease, or unspecified chronic kidney disease: Secondary | ICD-10-CM | POA: Diagnosis not present

## 2021-04-12 DIAGNOSIS — M810 Age-related osteoporosis without current pathological fracture: Secondary | ICD-10-CM | POA: Diagnosis not present

## 2021-04-12 DIAGNOSIS — G4700 Insomnia, unspecified: Secondary | ICD-10-CM | POA: Diagnosis not present

## 2021-04-12 DIAGNOSIS — E538 Deficiency of other specified B group vitamins: Secondary | ICD-10-CM | POA: Diagnosis not present

## 2021-04-16 DIAGNOSIS — M810 Age-related osteoporosis without current pathological fracture: Secondary | ICD-10-CM | POA: Diagnosis not present

## 2021-04-16 DIAGNOSIS — I872 Venous insufficiency (chronic) (peripheral): Secondary | ICD-10-CM | POA: Diagnosis not present

## 2021-04-16 DIAGNOSIS — E538 Deficiency of other specified B group vitamins: Secondary | ICD-10-CM | POA: Diagnosis not present

## 2021-04-16 DIAGNOSIS — H9190 Unspecified hearing loss, unspecified ear: Secondary | ICD-10-CM | POA: Diagnosis not present

## 2021-04-16 DIAGNOSIS — E782 Mixed hyperlipidemia: Secondary | ICD-10-CM | POA: Diagnosis not present

## 2021-04-16 DIAGNOSIS — F32A Depression, unspecified: Secondary | ICD-10-CM | POA: Diagnosis not present

## 2021-04-16 DIAGNOSIS — M19011 Primary osteoarthritis, right shoulder: Secondary | ICD-10-CM | POA: Diagnosis not present

## 2021-04-16 DIAGNOSIS — K449 Diaphragmatic hernia without obstruction or gangrene: Secondary | ICD-10-CM | POA: Diagnosis not present

## 2021-04-16 DIAGNOSIS — G4700 Insomnia, unspecified: Secondary | ICD-10-CM | POA: Diagnosis not present

## 2021-04-16 DIAGNOSIS — I129 Hypertensive chronic kidney disease with stage 1 through stage 4 chronic kidney disease, or unspecified chronic kidney disease: Secondary | ICD-10-CM | POA: Diagnosis not present

## 2021-04-16 DIAGNOSIS — N183 Chronic kidney disease, stage 3 unspecified: Secondary | ICD-10-CM | POA: Diagnosis not present

## 2021-04-16 DIAGNOSIS — Z9181 History of falling: Secondary | ICD-10-CM | POA: Diagnosis not present

## 2021-04-16 DIAGNOSIS — M19012 Primary osteoarthritis, left shoulder: Secondary | ICD-10-CM | POA: Diagnosis not present

## 2021-04-16 DIAGNOSIS — Z9183 Wandering in diseases classified elsewhere: Secondary | ICD-10-CM | POA: Diagnosis not present

## 2021-04-16 DIAGNOSIS — I4891 Unspecified atrial fibrillation: Secondary | ICD-10-CM | POA: Diagnosis not present

## 2021-04-16 DIAGNOSIS — K222 Esophageal obstruction: Secondary | ICD-10-CM | POA: Diagnosis not present

## 2021-04-16 DIAGNOSIS — F02818 Dementia in other diseases classified elsewhere, unspecified severity, with other behavioral disturbance: Secondary | ICD-10-CM | POA: Diagnosis not present

## 2021-04-17 DIAGNOSIS — N183 Chronic kidney disease, stage 3 unspecified: Secondary | ICD-10-CM | POA: Diagnosis not present

## 2021-04-17 DIAGNOSIS — F32A Depression, unspecified: Secondary | ICD-10-CM | POA: Diagnosis not present

## 2021-04-17 DIAGNOSIS — E538 Deficiency of other specified B group vitamins: Secondary | ICD-10-CM | POA: Diagnosis not present

## 2021-04-17 DIAGNOSIS — M810 Age-related osteoporosis without current pathological fracture: Secondary | ICD-10-CM | POA: Diagnosis not present

## 2021-04-17 DIAGNOSIS — H9190 Unspecified hearing loss, unspecified ear: Secondary | ICD-10-CM | POA: Diagnosis not present

## 2021-04-17 DIAGNOSIS — M19012 Primary osteoarthritis, left shoulder: Secondary | ICD-10-CM | POA: Diagnosis not present

## 2021-04-17 DIAGNOSIS — Z9181 History of falling: Secondary | ICD-10-CM | POA: Diagnosis not present

## 2021-04-17 DIAGNOSIS — M19011 Primary osteoarthritis, right shoulder: Secondary | ICD-10-CM | POA: Diagnosis not present

## 2021-04-17 DIAGNOSIS — K222 Esophageal obstruction: Secondary | ICD-10-CM | POA: Diagnosis not present

## 2021-04-17 DIAGNOSIS — Z9183 Wandering in diseases classified elsewhere: Secondary | ICD-10-CM | POA: Diagnosis not present

## 2021-04-17 DIAGNOSIS — K449 Diaphragmatic hernia without obstruction or gangrene: Secondary | ICD-10-CM | POA: Diagnosis not present

## 2021-04-17 DIAGNOSIS — I872 Venous insufficiency (chronic) (peripheral): Secondary | ICD-10-CM | POA: Diagnosis not present

## 2021-04-17 DIAGNOSIS — E782 Mixed hyperlipidemia: Secondary | ICD-10-CM | POA: Diagnosis not present

## 2021-04-17 DIAGNOSIS — I4891 Unspecified atrial fibrillation: Secondary | ICD-10-CM | POA: Diagnosis not present

## 2021-04-17 DIAGNOSIS — F02818 Dementia in other diseases classified elsewhere, unspecified severity, with other behavioral disturbance: Secondary | ICD-10-CM | POA: Diagnosis not present

## 2021-04-17 DIAGNOSIS — I129 Hypertensive chronic kidney disease with stage 1 through stage 4 chronic kidney disease, or unspecified chronic kidney disease: Secondary | ICD-10-CM | POA: Diagnosis not present

## 2021-04-17 DIAGNOSIS — G4700 Insomnia, unspecified: Secondary | ICD-10-CM | POA: Diagnosis not present

## 2021-04-19 DIAGNOSIS — E039 Hypothyroidism, unspecified: Secondary | ICD-10-CM | POA: Diagnosis not present

## 2021-04-19 DIAGNOSIS — Z9183 Wandering in diseases classified elsewhere: Secondary | ICD-10-CM | POA: Diagnosis not present

## 2021-04-19 DIAGNOSIS — E538 Deficiency of other specified B group vitamins: Secondary | ICD-10-CM | POA: Diagnosis not present

## 2021-04-19 DIAGNOSIS — I4891 Unspecified atrial fibrillation: Secondary | ICD-10-CM | POA: Diagnosis not present

## 2021-04-19 DIAGNOSIS — G4700 Insomnia, unspecified: Secondary | ICD-10-CM | POA: Diagnosis not present

## 2021-04-19 DIAGNOSIS — E559 Vitamin D deficiency, unspecified: Secondary | ICD-10-CM | POA: Diagnosis not present

## 2021-04-19 DIAGNOSIS — K449 Diaphragmatic hernia without obstruction or gangrene: Secondary | ICD-10-CM | POA: Diagnosis not present

## 2021-04-19 DIAGNOSIS — M810 Age-related osteoporosis without current pathological fracture: Secondary | ICD-10-CM | POA: Diagnosis not present

## 2021-04-19 DIAGNOSIS — F02818 Dementia in other diseases classified elsewhere, unspecified severity, with other behavioral disturbance: Secondary | ICD-10-CM | POA: Diagnosis not present

## 2021-04-19 DIAGNOSIS — D519 Vitamin B12 deficiency anemia, unspecified: Secondary | ICD-10-CM | POA: Diagnosis not present

## 2021-04-19 DIAGNOSIS — Z9181 History of falling: Secondary | ICD-10-CM | POA: Diagnosis not present

## 2021-04-19 DIAGNOSIS — E782 Mixed hyperlipidemia: Secondary | ICD-10-CM | POA: Diagnosis not present

## 2021-04-19 DIAGNOSIS — F32A Depression, unspecified: Secondary | ICD-10-CM | POA: Diagnosis not present

## 2021-04-19 DIAGNOSIS — K222 Esophageal obstruction: Secondary | ICD-10-CM | POA: Diagnosis not present

## 2021-04-19 DIAGNOSIS — I129 Hypertensive chronic kidney disease with stage 1 through stage 4 chronic kidney disease, or unspecified chronic kidney disease: Secondary | ICD-10-CM | POA: Diagnosis not present

## 2021-04-19 DIAGNOSIS — D508 Other iron deficiency anemias: Secondary | ICD-10-CM | POA: Diagnosis not present

## 2021-04-19 DIAGNOSIS — M19012 Primary osteoarthritis, left shoulder: Secondary | ICD-10-CM | POA: Diagnosis not present

## 2021-04-19 DIAGNOSIS — I872 Venous insufficiency (chronic) (peripheral): Secondary | ICD-10-CM | POA: Diagnosis not present

## 2021-04-19 DIAGNOSIS — N183 Chronic kidney disease, stage 3 unspecified: Secondary | ICD-10-CM | POA: Diagnosis not present

## 2021-04-19 DIAGNOSIS — E08311 Diabetes mellitus due to underlying condition with unspecified diabetic retinopathy with macular edema: Secondary | ICD-10-CM | POA: Diagnosis not present

## 2021-04-19 DIAGNOSIS — M19011 Primary osteoarthritis, right shoulder: Secondary | ICD-10-CM | POA: Diagnosis not present

## 2021-04-19 DIAGNOSIS — H9190 Unspecified hearing loss, unspecified ear: Secondary | ICD-10-CM | POA: Diagnosis not present

## 2021-04-23 DIAGNOSIS — N183 Chronic kidney disease, stage 3 unspecified: Secondary | ICD-10-CM | POA: Diagnosis not present

## 2021-04-23 DIAGNOSIS — F02818 Dementia in other diseases classified elsewhere, unspecified severity, with other behavioral disturbance: Secondary | ICD-10-CM | POA: Diagnosis not present

## 2021-04-23 DIAGNOSIS — Z9183 Wandering in diseases classified elsewhere: Secondary | ICD-10-CM | POA: Diagnosis not present

## 2021-04-23 DIAGNOSIS — I129 Hypertensive chronic kidney disease with stage 1 through stage 4 chronic kidney disease, or unspecified chronic kidney disease: Secondary | ICD-10-CM | POA: Diagnosis not present

## 2021-04-23 DIAGNOSIS — E782 Mixed hyperlipidemia: Secondary | ICD-10-CM | POA: Diagnosis not present

## 2021-04-23 DIAGNOSIS — K449 Diaphragmatic hernia without obstruction or gangrene: Secondary | ICD-10-CM | POA: Diagnosis not present

## 2021-04-23 DIAGNOSIS — Z9181 History of falling: Secondary | ICD-10-CM | POA: Diagnosis not present

## 2021-04-23 DIAGNOSIS — M19011 Primary osteoarthritis, right shoulder: Secondary | ICD-10-CM | POA: Diagnosis not present

## 2021-04-23 DIAGNOSIS — H9190 Unspecified hearing loss, unspecified ear: Secondary | ICD-10-CM | POA: Diagnosis not present

## 2021-04-23 DIAGNOSIS — M19012 Primary osteoarthritis, left shoulder: Secondary | ICD-10-CM | POA: Diagnosis not present

## 2021-04-23 DIAGNOSIS — M810 Age-related osteoporosis without current pathological fracture: Secondary | ICD-10-CM | POA: Diagnosis not present

## 2021-04-23 DIAGNOSIS — E538 Deficiency of other specified B group vitamins: Secondary | ICD-10-CM | POA: Diagnosis not present

## 2021-04-23 DIAGNOSIS — I4891 Unspecified atrial fibrillation: Secondary | ICD-10-CM | POA: Diagnosis not present

## 2021-04-23 DIAGNOSIS — F32A Depression, unspecified: Secondary | ICD-10-CM | POA: Diagnosis not present

## 2021-04-23 DIAGNOSIS — G4700 Insomnia, unspecified: Secondary | ICD-10-CM | POA: Diagnosis not present

## 2021-04-23 DIAGNOSIS — K222 Esophageal obstruction: Secondary | ICD-10-CM | POA: Diagnosis not present

## 2021-04-23 DIAGNOSIS — I872 Venous insufficiency (chronic) (peripheral): Secondary | ICD-10-CM | POA: Diagnosis not present

## 2021-04-24 DIAGNOSIS — F32A Depression, unspecified: Secondary | ICD-10-CM | POA: Diagnosis not present

## 2021-04-24 DIAGNOSIS — K222 Esophageal obstruction: Secondary | ICD-10-CM | POA: Diagnosis not present

## 2021-04-24 DIAGNOSIS — Z9181 History of falling: Secondary | ICD-10-CM | POA: Diagnosis not present

## 2021-04-24 DIAGNOSIS — E538 Deficiency of other specified B group vitamins: Secondary | ICD-10-CM | POA: Diagnosis not present

## 2021-04-24 DIAGNOSIS — I129 Hypertensive chronic kidney disease with stage 1 through stage 4 chronic kidney disease, or unspecified chronic kidney disease: Secondary | ICD-10-CM | POA: Diagnosis not present

## 2021-04-24 DIAGNOSIS — K449 Diaphragmatic hernia without obstruction or gangrene: Secondary | ICD-10-CM | POA: Diagnosis not present

## 2021-04-24 DIAGNOSIS — I4891 Unspecified atrial fibrillation: Secondary | ICD-10-CM | POA: Diagnosis not present

## 2021-04-24 DIAGNOSIS — I872 Venous insufficiency (chronic) (peripheral): Secondary | ICD-10-CM | POA: Diagnosis not present

## 2021-04-24 DIAGNOSIS — M19012 Primary osteoarthritis, left shoulder: Secondary | ICD-10-CM | POA: Diagnosis not present

## 2021-04-24 DIAGNOSIS — G4700 Insomnia, unspecified: Secondary | ICD-10-CM | POA: Diagnosis not present

## 2021-04-24 DIAGNOSIS — Z9183 Wandering in diseases classified elsewhere: Secondary | ICD-10-CM | POA: Diagnosis not present

## 2021-04-24 DIAGNOSIS — M810 Age-related osteoporosis without current pathological fracture: Secondary | ICD-10-CM | POA: Diagnosis not present

## 2021-04-24 DIAGNOSIS — N183 Chronic kidney disease, stage 3 unspecified: Secondary | ICD-10-CM | POA: Diagnosis not present

## 2021-04-24 DIAGNOSIS — H9190 Unspecified hearing loss, unspecified ear: Secondary | ICD-10-CM | POA: Diagnosis not present

## 2021-04-24 DIAGNOSIS — F02818 Dementia in other diseases classified elsewhere, unspecified severity, with other behavioral disturbance: Secondary | ICD-10-CM | POA: Diagnosis not present

## 2021-04-24 DIAGNOSIS — M19011 Primary osteoarthritis, right shoulder: Secondary | ICD-10-CM | POA: Diagnosis not present

## 2021-04-24 DIAGNOSIS — E782 Mixed hyperlipidemia: Secondary | ICD-10-CM | POA: Diagnosis not present

## 2021-04-26 DIAGNOSIS — Z9181 History of falling: Secondary | ICD-10-CM | POA: Diagnosis not present

## 2021-04-26 DIAGNOSIS — E782 Mixed hyperlipidemia: Secondary | ICD-10-CM | POA: Diagnosis not present

## 2021-04-26 DIAGNOSIS — E538 Deficiency of other specified B group vitamins: Secondary | ICD-10-CM | POA: Diagnosis not present

## 2021-04-26 DIAGNOSIS — F32A Depression, unspecified: Secondary | ICD-10-CM | POA: Diagnosis not present

## 2021-04-26 DIAGNOSIS — I872 Venous insufficiency (chronic) (peripheral): Secondary | ICD-10-CM | POA: Diagnosis not present

## 2021-04-26 DIAGNOSIS — N183 Chronic kidney disease, stage 3 unspecified: Secondary | ICD-10-CM | POA: Diagnosis not present

## 2021-04-26 DIAGNOSIS — F02818 Dementia in other diseases classified elsewhere, unspecified severity, with other behavioral disturbance: Secondary | ICD-10-CM | POA: Diagnosis not present

## 2021-04-26 DIAGNOSIS — M810 Age-related osteoporosis without current pathological fracture: Secondary | ICD-10-CM | POA: Diagnosis not present

## 2021-04-26 DIAGNOSIS — M19012 Primary osteoarthritis, left shoulder: Secondary | ICD-10-CM | POA: Diagnosis not present

## 2021-04-26 DIAGNOSIS — H9190 Unspecified hearing loss, unspecified ear: Secondary | ICD-10-CM | POA: Diagnosis not present

## 2021-04-26 DIAGNOSIS — M19011 Primary osteoarthritis, right shoulder: Secondary | ICD-10-CM | POA: Diagnosis not present

## 2021-04-26 DIAGNOSIS — I4891 Unspecified atrial fibrillation: Secondary | ICD-10-CM | POA: Diagnosis not present

## 2021-04-26 DIAGNOSIS — G4700 Insomnia, unspecified: Secondary | ICD-10-CM | POA: Diagnosis not present

## 2021-04-26 DIAGNOSIS — I129 Hypertensive chronic kidney disease with stage 1 through stage 4 chronic kidney disease, or unspecified chronic kidney disease: Secondary | ICD-10-CM | POA: Diagnosis not present

## 2021-04-26 DIAGNOSIS — K222 Esophageal obstruction: Secondary | ICD-10-CM | POA: Diagnosis not present

## 2021-04-26 DIAGNOSIS — Z9183 Wandering in diseases classified elsewhere: Secondary | ICD-10-CM | POA: Diagnosis not present

## 2021-04-26 DIAGNOSIS — K449 Diaphragmatic hernia without obstruction or gangrene: Secondary | ICD-10-CM | POA: Diagnosis not present

## 2021-04-30 DIAGNOSIS — E538 Deficiency of other specified B group vitamins: Secondary | ICD-10-CM | POA: Diagnosis not present

## 2021-04-30 DIAGNOSIS — G4700 Insomnia, unspecified: Secondary | ICD-10-CM | POA: Diagnosis not present

## 2021-04-30 DIAGNOSIS — Z9183 Wandering in diseases classified elsewhere: Secondary | ICD-10-CM | POA: Diagnosis not present

## 2021-04-30 DIAGNOSIS — K449 Diaphragmatic hernia without obstruction or gangrene: Secondary | ICD-10-CM | POA: Diagnosis not present

## 2021-04-30 DIAGNOSIS — K222 Esophageal obstruction: Secondary | ICD-10-CM | POA: Diagnosis not present

## 2021-04-30 DIAGNOSIS — I4891 Unspecified atrial fibrillation: Secondary | ICD-10-CM | POA: Diagnosis not present

## 2021-04-30 DIAGNOSIS — H9190 Unspecified hearing loss, unspecified ear: Secondary | ICD-10-CM | POA: Diagnosis not present

## 2021-04-30 DIAGNOSIS — I872 Venous insufficiency (chronic) (peripheral): Secondary | ICD-10-CM | POA: Diagnosis not present

## 2021-04-30 DIAGNOSIS — N183 Chronic kidney disease, stage 3 unspecified: Secondary | ICD-10-CM | POA: Diagnosis not present

## 2021-04-30 DIAGNOSIS — M19011 Primary osteoarthritis, right shoulder: Secondary | ICD-10-CM | POA: Diagnosis not present

## 2021-04-30 DIAGNOSIS — F32A Depression, unspecified: Secondary | ICD-10-CM | POA: Diagnosis not present

## 2021-04-30 DIAGNOSIS — M19012 Primary osteoarthritis, left shoulder: Secondary | ICD-10-CM | POA: Diagnosis not present

## 2021-04-30 DIAGNOSIS — I129 Hypertensive chronic kidney disease with stage 1 through stage 4 chronic kidney disease, or unspecified chronic kidney disease: Secondary | ICD-10-CM | POA: Diagnosis not present

## 2021-04-30 DIAGNOSIS — F02818 Dementia in other diseases classified elsewhere, unspecified severity, with other behavioral disturbance: Secondary | ICD-10-CM | POA: Diagnosis not present

## 2021-04-30 DIAGNOSIS — M810 Age-related osteoporosis without current pathological fracture: Secondary | ICD-10-CM | POA: Diagnosis not present

## 2021-04-30 DIAGNOSIS — Z9181 History of falling: Secondary | ICD-10-CM | POA: Diagnosis not present

## 2021-04-30 DIAGNOSIS — E782 Mixed hyperlipidemia: Secondary | ICD-10-CM | POA: Diagnosis not present

## 2021-05-04 DIAGNOSIS — M19011 Primary osteoarthritis, right shoulder: Secondary | ICD-10-CM | POA: Diagnosis not present

## 2021-05-04 DIAGNOSIS — M810 Age-related osteoporosis without current pathological fracture: Secondary | ICD-10-CM | POA: Diagnosis not present

## 2021-05-04 DIAGNOSIS — I4891 Unspecified atrial fibrillation: Secondary | ICD-10-CM | POA: Diagnosis not present

## 2021-05-04 DIAGNOSIS — E538 Deficiency of other specified B group vitamins: Secondary | ICD-10-CM | POA: Diagnosis not present

## 2021-05-04 DIAGNOSIS — I129 Hypertensive chronic kidney disease with stage 1 through stage 4 chronic kidney disease, or unspecified chronic kidney disease: Secondary | ICD-10-CM | POA: Diagnosis not present

## 2021-05-04 DIAGNOSIS — M19012 Primary osteoarthritis, left shoulder: Secondary | ICD-10-CM | POA: Diagnosis not present

## 2021-05-04 DIAGNOSIS — Z9183 Wandering in diseases classified elsewhere: Secondary | ICD-10-CM | POA: Diagnosis not present

## 2021-05-04 DIAGNOSIS — F02818 Dementia in other diseases classified elsewhere, unspecified severity, with other behavioral disturbance: Secondary | ICD-10-CM | POA: Diagnosis not present

## 2021-05-04 DIAGNOSIS — N183 Chronic kidney disease, stage 3 unspecified: Secondary | ICD-10-CM | POA: Diagnosis not present

## 2021-05-04 DIAGNOSIS — I872 Venous insufficiency (chronic) (peripheral): Secondary | ICD-10-CM | POA: Diagnosis not present

## 2021-05-04 DIAGNOSIS — K222 Esophageal obstruction: Secondary | ICD-10-CM | POA: Diagnosis not present

## 2021-05-04 DIAGNOSIS — K449 Diaphragmatic hernia without obstruction or gangrene: Secondary | ICD-10-CM | POA: Diagnosis not present

## 2021-05-04 DIAGNOSIS — G4700 Insomnia, unspecified: Secondary | ICD-10-CM | POA: Diagnosis not present

## 2021-05-04 DIAGNOSIS — Z9181 History of falling: Secondary | ICD-10-CM | POA: Diagnosis not present

## 2021-05-04 DIAGNOSIS — F32A Depression, unspecified: Secondary | ICD-10-CM | POA: Diagnosis not present

## 2021-05-04 DIAGNOSIS — H9190 Unspecified hearing loss, unspecified ear: Secondary | ICD-10-CM | POA: Diagnosis not present

## 2021-05-04 DIAGNOSIS — E782 Mixed hyperlipidemia: Secondary | ICD-10-CM | POA: Diagnosis not present

## 2021-05-07 DIAGNOSIS — F32A Depression, unspecified: Secondary | ICD-10-CM | POA: Diagnosis not present

## 2021-05-07 DIAGNOSIS — Z9183 Wandering in diseases classified elsewhere: Secondary | ICD-10-CM | POA: Diagnosis not present

## 2021-05-07 DIAGNOSIS — M19011 Primary osteoarthritis, right shoulder: Secondary | ICD-10-CM | POA: Diagnosis not present

## 2021-05-07 DIAGNOSIS — H9190 Unspecified hearing loss, unspecified ear: Secondary | ICD-10-CM | POA: Diagnosis not present

## 2021-05-07 DIAGNOSIS — E538 Deficiency of other specified B group vitamins: Secondary | ICD-10-CM | POA: Diagnosis not present

## 2021-05-07 DIAGNOSIS — K222 Esophageal obstruction: Secondary | ICD-10-CM | POA: Diagnosis not present

## 2021-05-07 DIAGNOSIS — I129 Hypertensive chronic kidney disease with stage 1 through stage 4 chronic kidney disease, or unspecified chronic kidney disease: Secondary | ICD-10-CM | POA: Diagnosis not present

## 2021-05-07 DIAGNOSIS — G4700 Insomnia, unspecified: Secondary | ICD-10-CM | POA: Diagnosis not present

## 2021-05-07 DIAGNOSIS — M19012 Primary osteoarthritis, left shoulder: Secondary | ICD-10-CM | POA: Diagnosis not present

## 2021-05-07 DIAGNOSIS — Z9181 History of falling: Secondary | ICD-10-CM | POA: Diagnosis not present

## 2021-05-07 DIAGNOSIS — I4891 Unspecified atrial fibrillation: Secondary | ICD-10-CM | POA: Diagnosis not present

## 2021-05-07 DIAGNOSIS — I872 Venous insufficiency (chronic) (peripheral): Secondary | ICD-10-CM | POA: Diagnosis not present

## 2021-05-07 DIAGNOSIS — M810 Age-related osteoporosis without current pathological fracture: Secondary | ICD-10-CM | POA: Diagnosis not present

## 2021-05-07 DIAGNOSIS — K449 Diaphragmatic hernia without obstruction or gangrene: Secondary | ICD-10-CM | POA: Diagnosis not present

## 2021-05-07 DIAGNOSIS — N183 Chronic kidney disease, stage 3 unspecified: Secondary | ICD-10-CM | POA: Diagnosis not present

## 2021-05-07 DIAGNOSIS — F02818 Dementia in other diseases classified elsewhere, unspecified severity, with other behavioral disturbance: Secondary | ICD-10-CM | POA: Diagnosis not present

## 2021-05-07 DIAGNOSIS — E782 Mixed hyperlipidemia: Secondary | ICD-10-CM | POA: Diagnosis not present

## 2021-05-14 DIAGNOSIS — K222 Esophageal obstruction: Secondary | ICD-10-CM | POA: Diagnosis not present

## 2021-05-14 DIAGNOSIS — N183 Chronic kidney disease, stage 3 unspecified: Secondary | ICD-10-CM | POA: Diagnosis not present

## 2021-05-14 DIAGNOSIS — M19011 Primary osteoarthritis, right shoulder: Secondary | ICD-10-CM | POA: Diagnosis not present

## 2021-05-14 DIAGNOSIS — G4700 Insomnia, unspecified: Secondary | ICD-10-CM | POA: Diagnosis not present

## 2021-05-14 DIAGNOSIS — E538 Deficiency of other specified B group vitamins: Secondary | ICD-10-CM | POA: Diagnosis not present

## 2021-05-14 DIAGNOSIS — H9190 Unspecified hearing loss, unspecified ear: Secondary | ICD-10-CM | POA: Diagnosis not present

## 2021-05-14 DIAGNOSIS — M19012 Primary osteoarthritis, left shoulder: Secondary | ICD-10-CM | POA: Diagnosis not present

## 2021-05-14 DIAGNOSIS — E782 Mixed hyperlipidemia: Secondary | ICD-10-CM | POA: Diagnosis not present

## 2021-05-14 DIAGNOSIS — I4891 Unspecified atrial fibrillation: Secondary | ICD-10-CM | POA: Diagnosis not present

## 2021-05-14 DIAGNOSIS — F32A Depression, unspecified: Secondary | ICD-10-CM | POA: Diagnosis not present

## 2021-05-14 DIAGNOSIS — I872 Venous insufficiency (chronic) (peripheral): Secondary | ICD-10-CM | POA: Diagnosis not present

## 2021-05-14 DIAGNOSIS — Z9181 History of falling: Secondary | ICD-10-CM | POA: Diagnosis not present

## 2021-05-14 DIAGNOSIS — K449 Diaphragmatic hernia without obstruction or gangrene: Secondary | ICD-10-CM | POA: Diagnosis not present

## 2021-05-14 DIAGNOSIS — F02818 Dementia in other diseases classified elsewhere, unspecified severity, with other behavioral disturbance: Secondary | ICD-10-CM | POA: Diagnosis not present

## 2021-05-14 DIAGNOSIS — I129 Hypertensive chronic kidney disease with stage 1 through stage 4 chronic kidney disease, or unspecified chronic kidney disease: Secondary | ICD-10-CM | POA: Diagnosis not present

## 2021-05-14 DIAGNOSIS — M810 Age-related osteoporosis without current pathological fracture: Secondary | ICD-10-CM | POA: Diagnosis not present

## 2021-05-14 DIAGNOSIS — Z9183 Wandering in diseases classified elsewhere: Secondary | ICD-10-CM | POA: Diagnosis not present

## 2021-05-21 DIAGNOSIS — I872 Venous insufficiency (chronic) (peripheral): Secondary | ICD-10-CM | POA: Diagnosis not present

## 2021-05-21 DIAGNOSIS — M19011 Primary osteoarthritis, right shoulder: Secondary | ICD-10-CM | POA: Diagnosis not present

## 2021-05-21 DIAGNOSIS — G4700 Insomnia, unspecified: Secondary | ICD-10-CM | POA: Diagnosis not present

## 2021-05-21 DIAGNOSIS — F02818 Dementia in other diseases classified elsewhere, unspecified severity, with other behavioral disturbance: Secondary | ICD-10-CM | POA: Diagnosis not present

## 2021-05-21 DIAGNOSIS — E538 Deficiency of other specified B group vitamins: Secondary | ICD-10-CM | POA: Diagnosis not present

## 2021-05-21 DIAGNOSIS — M19012 Primary osteoarthritis, left shoulder: Secondary | ICD-10-CM | POA: Diagnosis not present

## 2021-05-21 DIAGNOSIS — I4891 Unspecified atrial fibrillation: Secondary | ICD-10-CM | POA: Diagnosis not present

## 2021-05-21 DIAGNOSIS — I129 Hypertensive chronic kidney disease with stage 1 through stage 4 chronic kidney disease, or unspecified chronic kidney disease: Secondary | ICD-10-CM | POA: Diagnosis not present

## 2021-05-21 DIAGNOSIS — H9190 Unspecified hearing loss, unspecified ear: Secondary | ICD-10-CM | POA: Diagnosis not present

## 2021-05-21 DIAGNOSIS — M810 Age-related osteoporosis without current pathological fracture: Secondary | ICD-10-CM | POA: Diagnosis not present

## 2021-05-21 DIAGNOSIS — N183 Chronic kidney disease, stage 3 unspecified: Secondary | ICD-10-CM | POA: Diagnosis not present

## 2021-05-21 DIAGNOSIS — K449 Diaphragmatic hernia without obstruction or gangrene: Secondary | ICD-10-CM | POA: Diagnosis not present

## 2021-05-21 DIAGNOSIS — Z9183 Wandering in diseases classified elsewhere: Secondary | ICD-10-CM | POA: Diagnosis not present

## 2021-05-21 DIAGNOSIS — Z9181 History of falling: Secondary | ICD-10-CM | POA: Diagnosis not present

## 2021-05-21 DIAGNOSIS — F32A Depression, unspecified: Secondary | ICD-10-CM | POA: Diagnosis not present

## 2021-05-21 DIAGNOSIS — E782 Mixed hyperlipidemia: Secondary | ICD-10-CM | POA: Diagnosis not present

## 2021-05-21 DIAGNOSIS — K222 Esophageal obstruction: Secondary | ICD-10-CM | POA: Diagnosis not present

## 2021-05-29 DIAGNOSIS — M19012 Primary osteoarthritis, left shoulder: Secondary | ICD-10-CM | POA: Diagnosis not present

## 2021-05-29 DIAGNOSIS — M19011 Primary osteoarthritis, right shoulder: Secondary | ICD-10-CM | POA: Diagnosis not present

## 2021-05-29 DIAGNOSIS — F32A Depression, unspecified: Secondary | ICD-10-CM | POA: Diagnosis not present

## 2021-05-29 DIAGNOSIS — E782 Mixed hyperlipidemia: Secondary | ICD-10-CM | POA: Diagnosis not present

## 2021-05-29 DIAGNOSIS — E538 Deficiency of other specified B group vitamins: Secondary | ICD-10-CM | POA: Diagnosis not present

## 2021-05-29 DIAGNOSIS — K222 Esophageal obstruction: Secondary | ICD-10-CM | POA: Diagnosis not present

## 2021-05-29 DIAGNOSIS — H9190 Unspecified hearing loss, unspecified ear: Secondary | ICD-10-CM | POA: Diagnosis not present

## 2021-05-29 DIAGNOSIS — Z9183 Wandering in diseases classified elsewhere: Secondary | ICD-10-CM | POA: Diagnosis not present

## 2021-05-29 DIAGNOSIS — F02818 Dementia in other diseases classified elsewhere, unspecified severity, with other behavioral disturbance: Secondary | ICD-10-CM | POA: Diagnosis not present

## 2021-05-29 DIAGNOSIS — I872 Venous insufficiency (chronic) (peripheral): Secondary | ICD-10-CM | POA: Diagnosis not present

## 2021-05-29 DIAGNOSIS — N183 Chronic kidney disease, stage 3 unspecified: Secondary | ICD-10-CM | POA: Diagnosis not present

## 2021-05-29 DIAGNOSIS — I129 Hypertensive chronic kidney disease with stage 1 through stage 4 chronic kidney disease, or unspecified chronic kidney disease: Secondary | ICD-10-CM | POA: Diagnosis not present

## 2021-05-29 DIAGNOSIS — M810 Age-related osteoporosis without current pathological fracture: Secondary | ICD-10-CM | POA: Diagnosis not present

## 2021-05-29 DIAGNOSIS — K449 Diaphragmatic hernia without obstruction or gangrene: Secondary | ICD-10-CM | POA: Diagnosis not present

## 2021-05-29 DIAGNOSIS — G4700 Insomnia, unspecified: Secondary | ICD-10-CM | POA: Diagnosis not present

## 2021-05-29 DIAGNOSIS — I4891 Unspecified atrial fibrillation: Secondary | ICD-10-CM | POA: Diagnosis not present

## 2021-05-29 DIAGNOSIS — Z9181 History of falling: Secondary | ICD-10-CM | POA: Diagnosis not present

## 2021-07-13 DIAGNOSIS — I4891 Unspecified atrial fibrillation: Secondary | ICD-10-CM | POA: Diagnosis not present

## 2021-07-13 DIAGNOSIS — Z8719 Personal history of other diseases of the digestive system: Secondary | ICD-10-CM | POA: Diagnosis not present

## 2021-07-13 DIAGNOSIS — E559 Vitamin D deficiency, unspecified: Secondary | ICD-10-CM | POA: Diagnosis not present

## 2021-07-13 DIAGNOSIS — N183 Chronic kidney disease, stage 3 unspecified: Secondary | ICD-10-CM | POA: Diagnosis not present

## 2021-07-13 DIAGNOSIS — E785 Hyperlipidemia, unspecified: Secondary | ICD-10-CM | POA: Diagnosis not present

## 2021-07-13 DIAGNOSIS — G4700 Insomnia, unspecified: Secondary | ICD-10-CM | POA: Diagnosis not present

## 2021-07-13 DIAGNOSIS — F32A Depression, unspecified: Secondary | ICD-10-CM | POA: Diagnosis not present

## 2021-07-20 ENCOUNTER — Other Ambulatory Visit: Payer: Self-pay

## 2021-07-20 ENCOUNTER — Emergency Department (HOSPITAL_BASED_OUTPATIENT_CLINIC_OR_DEPARTMENT_OTHER): Payer: Medicare Other | Admitting: Radiology

## 2021-07-20 ENCOUNTER — Emergency Department (HOSPITAL_BASED_OUTPATIENT_CLINIC_OR_DEPARTMENT_OTHER)
Admission: EM | Admit: 2021-07-20 | Discharge: 2021-07-20 | Disposition: A | Discharge status: Skilled Nursing Facility | Payer: Medicare Other | Attending: Emergency Medicine | Admitting: Emergency Medicine

## 2021-07-20 DIAGNOSIS — R0689 Other abnormalities of breathing: Secondary | ICD-10-CM | POA: Diagnosis not present

## 2021-07-20 DIAGNOSIS — Z79899 Other long term (current) drug therapy: Secondary | ICD-10-CM | POA: Diagnosis not present

## 2021-07-20 DIAGNOSIS — R509 Fever, unspecified: Secondary | ICD-10-CM

## 2021-07-20 DIAGNOSIS — I10 Essential (primary) hypertension: Secondary | ICD-10-CM | POA: Insufficient documentation

## 2021-07-20 DIAGNOSIS — R051 Acute cough: Secondary | ICD-10-CM

## 2021-07-20 DIAGNOSIS — Z743 Need for continuous supervision: Secondary | ICD-10-CM | POA: Diagnosis not present

## 2021-07-20 DIAGNOSIS — H1033 Unspecified acute conjunctivitis, bilateral: Secondary | ICD-10-CM | POA: Insufficient documentation

## 2021-07-20 DIAGNOSIS — R6889 Other general symptoms and signs: Secondary | ICD-10-CM | POA: Diagnosis not present

## 2021-07-20 DIAGNOSIS — R059 Cough, unspecified: Secondary | ICD-10-CM | POA: Diagnosis not present

## 2021-07-20 DIAGNOSIS — F0390 Unspecified dementia without behavioral disturbance: Secondary | ICD-10-CM | POA: Insufficient documentation

## 2021-07-20 DIAGNOSIS — J189 Pneumonia, unspecified organism: Secondary | ICD-10-CM | POA: Insufficient documentation

## 2021-07-20 DIAGNOSIS — R9431 Abnormal electrocardiogram [ECG] [EKG]: Secondary | ICD-10-CM | POA: Diagnosis not present

## 2021-07-20 LAB — BASIC METABOLIC PANEL
Anion gap: 12 (ref 5–15)
BUN: 17 mg/dL (ref 8–23)
CO2: 25 mmol/L (ref 22–32)
Calcium: 9.7 mg/dL (ref 8.9–10.3)
Chloride: 102 mmol/L (ref 98–111)
Creatinine, Ser: 0.75 mg/dL (ref 0.44–1.00)
GFR, Estimated: >60 mL/min (ref >60)
Glucose, Bld: 96 mg/dL (ref 70–99)
Potassium: 3.9 mmol/L (ref 3.5–5.1)
Sodium: 139 mmol/L (ref 135–145)

## 2021-07-20 LAB — CBC WITH DIFFERENTIAL/PLATELET
Abs Immature Granulocytes: 0.06 10*3/uL (ref 0.00–0.07)
Basophils Absolute: 0.1 10*3/uL (ref 0.0–0.1)
Basophils Relative: 1 %
Eosinophils Absolute: 0 10*3/uL (ref 0.0–0.5)
Eosinophils Relative: 0 %
HCT: 35.8 % — ABNORMAL LOW (ref 36.0–46.0)
Hemoglobin: 11.6 g/dL — ABNORMAL LOW (ref 12.0–15.0)
Immature Granulocytes: 1 %
Lymphocytes Relative: 15 %
Lymphs Abs: 1.9 10*3/uL (ref 0.7–4.0)
MCH: 31.2 pg (ref 26.0–34.0)
MCHC: 32.4 g/dL (ref 30.0–36.0)
MCV: 96.2 fL (ref 80.0–100.0)
Monocytes Absolute: 1.6 10*3/uL — ABNORMAL HIGH (ref 0.1–1.0)
Monocytes Relative: 13 %
Neutro Abs: 9 10*3/uL — ABNORMAL HIGH (ref 1.7–7.7)
Neutrophils Relative %: 70 %
Platelets: 291 10*3/uL (ref 150–400)
RBC: 3.72 MIL/uL — ABNORMAL LOW (ref 3.87–5.11)
RDW: 12.6 % (ref 11.5–15.5)
WBC: 12.7 10*3/uL — ABNORMAL HIGH (ref 4.0–10.5)
nRBC: 0 % (ref 0.0–0.2)

## 2021-07-20 LAB — URINALYSIS, ROUTINE W REFLEX MICROSCOPIC
Bilirubin Urine: NEGATIVE
Glucose, UA: NEGATIVE mg/dL
Nitrite: NEGATIVE
Protein, ur: 30 mg/dL — AB
Specific Gravity, Urine: 1.014 (ref 1.005–1.030)
WBC, UA: >50 WBC/hpf — ABNORMAL HIGH (ref 0–5)
pH: 6 (ref 5.0–8.0)

## 2021-07-20 MED ORDER — TOBRAMYCIN-DEXAMETHASONE 0.3-0.1 % OP SUSP
1.0000 [drp] | OPHTHALMIC | 0 refills | Status: DC
Start: 2021-07-20 — End: 2022-07-13

## 2021-07-20 MED ORDER — AMOXICILLIN-POT CLAVULANATE 875-125 MG PO TABS: 1.0000 | ORAL_TABLET | Freq: Two times a day (BID) | ORAL | 0 refills | Status: AC

## 2021-07-20 MED ORDER — AZITHROMYCIN 250 MG PO TABS: ORAL_TABLET | ORAL | 0 refills | Status: DC

## 2021-07-20 NOTE — ED Provider Notes (Signed)
[place] EMERGENCY DEPT Provider Note   CSN: [id number] Arrival date & time: 07/20/21  1144     History  Chief Complaint  Patient presents with   Fever    [name] is a 88 y.o. female.  Patient presents to the hospital via EMS for evaluation of a possible fever and pneumonia rule out.  Patient has been reported as "feverish" since Sunday.  Patient does have a cough.  Patient states she has no complaints and wants to go home.  Patient has history of dementia.  Patient does not complain of headache, chest pain, shortness of breath, abdominal pain, urinary symptoms.  Past medical history significant for hypertension, venous insufficiency, history of UTI, dementia, hearing loss, anemia  HPI     Home Medications Prior to Admission medications   Medication Sig Start Date End Date Taking? Authorizing Provider  amoxicillin-clavulanate (AUGMENTIN) 875-125 MG tablet Take 1 tablet by mouth every 12 (twelve) hours for 5 days. 07/20/21 07/25/21 Yes Dorothyann Peng, PA-C  azithromycin (ZITHROMAX) 250 MG tablet Take 2 tablets on day 1, followed by 1 tablet daily for 4 days 07/20/21  Yes Dorothyann Peng, PA-C  tobramycin-dexamethasone [facility]) ophthalmic solution Place 1 drop into both eyes every 4 (four) hours while awake. 07/20/21  Yes Dorothyann Peng, PA-C  acetaminophen (TYLENOL) 500 MG tablet Take 500 mg by mouth daily at 12 noon. As needed    [provider]  diltiazem (CARDIZEM CD) 180 MG 24 hr capsule Take 1 capsule (180 mg total) by mouth daily. 03/30/20   Ngetich, Dinah C, NP  metoprolol succinate (TOPROL-XL) 25 MG 24 hr tablet Take 0.5 tablets (12.5 mg total) by mouth daily. 08/28/20   Fargo, Amy E, NP  traZODone (DESYREL) 50 MG tablet Take 50 mg by mouth as needed.    [provider]  diltiazem (DILACOR XR) 180 MG 24 hr capsule Take 1 capsule (180 mg total) by mouth daily. 12/04/10 12/30/11  Noralee Space, MD      Allergies    Patient has no  known allergies.    Review of Systems   Review of Systems  Constitutional:  Positive for fever.  Eyes:  Positive for discharge, redness and itching.  Respiratory:  Positive for cough. Negative for shortness of breath.   Cardiovascular:  Negative for chest pain.  Gastrointestinal:  Negative for abdominal pain, nausea and vomiting.  Genitourinary:  Negative for dysuria.  Skin:  Negative for pallor.  Neurological:  Negative for syncope.   Physical Exam Updated Vital Signs BP (!) 151/122   Pulse (!) 59   Temp 98.2 F (36.8 C)   Resp 20   SpO2 94%  Physical Exam Vitals and nursing note reviewed.  Constitutional:      General: She is not in acute distress. HENT:     Head: Normocephalic and atraumatic.     Nose: Nose normal.     Mouth/Throat:     Mouth: Mucous membranes are moist.  Eyes:     General:        Right eye: Discharge present.        Left eye: Discharge present.    Conjunctiva/sclera:     Right eye: Right conjunctiva is injected.     Left eye: Left conjunctiva is injected.  Cardiovascular:     Rate and Rhythm: Normal rate and regular rhythm.     Pulses: Normal pulses.     Heart sounds: Normal heart sounds.  Pulmonary:  Effort: Pulmonary effort is normal.     Breath sounds: Rhonchi present.     Comments: Scattered rhonchi throughout lung fields.  No diminished fields Abdominal:     Palpations: Abdomen is soft.     Tenderness: There is no abdominal tenderness.  Musculoskeletal:     Cervical back: Normal range of motion and neck supple.  Skin:    General: Skin is warm and dry.  Neurological:     Mental Status: She is alert. Mental status is at baseline.    ED Results / Procedures / Treatments   Labs (all labs ordered are listed, but only abnormal results are displayed) Labs Reviewed  CBC WITH DIFFERENTIAL/PLATELET - Abnormal; Notable for the following components:      Result Value   WBC 12.7 (*)    RBC 3.72 (*)    Hemoglobin 11.6 (*)    HCT 35.8 (*)     Neutro Abs 9.0 (*)    Monocytes Absolute 1.6 (*)    All other components within normal limits  URINALYSIS, ROUTINE W REFLEX MICROSCOPIC - Abnormal; Notable for the following components:   APPearance HAZY (*)    Hgb urine dipstick SMALL (*)    Ketones, ur TRACE (*)    Protein, ur 30 (*)    Leukocytes,Ua LARGE (*)    WBC, UA >50 (*)    Bacteria, UA MANY (*)    Non Squamous Epithelial 0-5 (*)    All other components within normal limits  BASIC METABOLIC PANEL    EKG None  Radiology DG Chest 2 View  Result Date: 07/20/2021 CLINICAL DATA:  Cough EXAM: CHEST - 2 VIEW COMPARISON:  09/01/2017 FINDINGS: Transverse diameter of heart is in the upper limits of normal. There is soft tissue fullness in the retrocardiac region suggesting fixed hiatal hernia. Patient's chin is partly obscuring the apices. There are no signs of pulmonary edema or focal consolidation in the visualized lung fields. Small linear densities seen in the medial left lower lung fields. There is no pleural effusion or pneumothorax. IMPRESSION: There are no signs of pulmonary edema or focal pulmonary consolidation. Linear densities in the medial left lower lung fields may suggest subsegmental atelectasis. Electronically Signed   By: Elmer Picker M.D.   On: 07/20/2021 12:19    Procedures Procedures    Medications Ordered in ED Medications - No data to display  ED Course/ Medical Decision Making/ A&P                           Medical Decision Making Amount and/or Complexity of Data Reviewed Labs: ordered. Radiology: ordered. ECG/medicine tests: ordered.  Risk Prescription drug management.   This patient presents to the ED for concern of cough, this involves an extensive number of treatment options, and is a complaint that carries with it a high risk of complications and morbidity.  The differential diagnosis includes pneumonia, COPD, viral illness, and others   Co morbidities that complicate the patient  evaluation  History of heart disease   Additional history obtained:  Additional history obtained from patient's daughter External records from outside source obtained and reviewed including notes documenting aphasia   Lab Tests:  I Ordered, and personally interpreted labs.  The pertinent results include:  WBC 12.7, hemoglobin 11.6, possibly contaminated urine with hazy appearance, small hemoglobin, trace ketones, 30 protein, large leukocytes, many bacteria, and non-squamous epithelial present   Imaging Studies ordered:  I ordered imaging studies including chest  x-ray I independently visualized and interpreted imaging which showed no signs of pulmonary edema or focal pulmonary consolidation.  Linear densities in the medial left lower lung fields may suggest subsegmental atelectasis I agree with the radiologist interpretation   Cardiac Monitoring: / EKG:  The patient was maintained on a cardiac monitor.  I personally viewed and interpreted the cardiac monitored which showed an underlying rhythm of: Sinus rhythm   Social Determinants of Health:  Patient with dementia, living in assisted living   Test / Admission - Considered:  The patient's chest x-ray was unequivocal for pneumonia but her symptoms are consistent.  I discussed the case with my attending, Dr. Pearline Cables, who recommended starting the patient on empiric antibiotics for community-acquired pneumonia.  Discharged home with azithromycin and Augmentin.  The patient also complained upon reassessment of eye discharge and scleral injection.  This seems consistent with conjunctivitis.  Prescribed antibiotic eyedrops for bacterial coverage.  The patient should follow-up with her facility physician  Final Clinical Impression(s) / ED Diagnoses Final diagnoses:  Community acquired pneumonia, unspecified laterality  Acute cough  Fever, unspecified fever cause  Acute conjunctivitis of both eyes, unspecified acute conjunctivitis type     Rx / DC Orders ED Discharge Orders          Ordered    azithromycin (ZITHROMAX) 250 MG tablet        07/20/21 1615    amoxicillin-clavulanate (AUGMENTIN) 875-125 MG tablet  Every 12 hours        07/20/21 1615    tobramycin-dexamethasone (TOBRADEX) ophthalmic solution  Every 4 hours while awake        07/20/21 1615              Dorothyann Peng, PA-C 07/20/21 Gould, [place], DO 07/23/21 2021

## 2021-07-20 NOTE — ED Notes (Signed)
RT note: Pt. was seen upon arrival by EMS at [facility] and was noted to have n/c at 2 lpm due to initial room air saturations of 92% which per [facility] Protocol was placed on Oxygen at 2lpm. This RT obtained full Oxygen tank which was placed in portable carrier and pt. was placed on 2 lpm n/c while in lobby after helping [facility] move pt. from their stretcher into W/C with initial Oxygen saturations of 95% while on 2 lpm n/c and HR of 67.

## 2021-07-20 NOTE — Discharge Instructions (Signed)
You were diagnosed today with a possible developing pneumonia.  You were also seen for conjunctivitis.  I have prescribed antibiotics to cover for both.  Please take the entirety of the prescriptions.  I recommend following up with primary care or the facility physician as needed.

## 2021-07-20 NOTE — ED Triage Notes (Signed)
Patient reports to the ER for fever-ish since Sunday. Patient has a cough. Patient has a hx of dementia and feels find. Patient wanted her sent here for pneumonia rule out.   CBG 134 O2 92% on RA 100/52 BP 57 HR

## 2021-08-06 DIAGNOSIS — H900 Conductive hearing loss, bilateral: Secondary | ICD-10-CM | POA: Diagnosis not present

## 2021-08-06 DIAGNOSIS — R1312 Dysphagia, oropharyngeal phase: Secondary | ICD-10-CM | POA: Diagnosis not present

## 2021-08-06 DIAGNOSIS — R4701 Aphasia: Secondary | ICD-10-CM | POA: Diagnosis not present

## 2021-08-07 DIAGNOSIS — R1312 Dysphagia, oropharyngeal phase: Secondary | ICD-10-CM | POA: Diagnosis not present

## 2021-08-07 DIAGNOSIS — R4701 Aphasia: Secondary | ICD-10-CM | POA: Diagnosis not present

## 2021-08-07 DIAGNOSIS — H900 Conductive hearing loss, bilateral: Secondary | ICD-10-CM | POA: Diagnosis not present

## 2021-08-08 DIAGNOSIS — R4701 Aphasia: Secondary | ICD-10-CM | POA: Diagnosis not present

## 2021-08-08 DIAGNOSIS — R1312 Dysphagia, oropharyngeal phase: Secondary | ICD-10-CM | POA: Diagnosis not present

## 2021-08-08 DIAGNOSIS — H900 Conductive hearing loss, bilateral: Secondary | ICD-10-CM | POA: Diagnosis not present

## 2021-08-09 DIAGNOSIS — R1312 Dysphagia, oropharyngeal phase: Secondary | ICD-10-CM | POA: Diagnosis not present

## 2021-08-09 DIAGNOSIS — H900 Conductive hearing loss, bilateral: Secondary | ICD-10-CM | POA: Diagnosis not present

## 2021-08-09 DIAGNOSIS — R4701 Aphasia: Secondary | ICD-10-CM | POA: Diagnosis not present

## 2021-08-10 DIAGNOSIS — R1312 Dysphagia, oropharyngeal phase: Secondary | ICD-10-CM | POA: Diagnosis not present

## 2021-08-10 DIAGNOSIS — R4701 Aphasia: Secondary | ICD-10-CM | POA: Diagnosis not present

## 2021-08-10 DIAGNOSIS — H900 Conductive hearing loss, bilateral: Secondary | ICD-10-CM | POA: Diagnosis not present

## 2021-08-13 DIAGNOSIS — H900 Conductive hearing loss, bilateral: Secondary | ICD-10-CM | POA: Diagnosis not present

## 2021-08-13 DIAGNOSIS — R1312 Dysphagia, oropharyngeal phase: Secondary | ICD-10-CM | POA: Diagnosis not present

## 2021-08-13 DIAGNOSIS — R4701 Aphasia: Secondary | ICD-10-CM | POA: Diagnosis not present

## 2021-08-15 DIAGNOSIS — R1312 Dysphagia, oropharyngeal phase: Secondary | ICD-10-CM | POA: Diagnosis not present

## 2021-08-15 DIAGNOSIS — R4701 Aphasia: Secondary | ICD-10-CM | POA: Diagnosis not present

## 2021-08-15 DIAGNOSIS — H900 Conductive hearing loss, bilateral: Secondary | ICD-10-CM | POA: Diagnosis not present

## 2021-08-17 DIAGNOSIS — R1312 Dysphagia, oropharyngeal phase: Secondary | ICD-10-CM | POA: Diagnosis not present

## 2021-08-17 DIAGNOSIS — R4701 Aphasia: Secondary | ICD-10-CM | POA: Diagnosis not present

## 2021-08-17 DIAGNOSIS — H900 Conductive hearing loss, bilateral: Secondary | ICD-10-CM | POA: Diagnosis not present

## 2021-08-21 DIAGNOSIS — R1312 Dysphagia, oropharyngeal phase: Secondary | ICD-10-CM | POA: Diagnosis not present

## 2021-08-21 DIAGNOSIS — R4701 Aphasia: Secondary | ICD-10-CM | POA: Diagnosis not present

## 2021-08-21 DIAGNOSIS — H900 Conductive hearing loss, bilateral: Secondary | ICD-10-CM | POA: Diagnosis not present

## 2021-08-22 DIAGNOSIS — H900 Conductive hearing loss, bilateral: Secondary | ICD-10-CM | POA: Diagnosis not present

## 2021-08-22 DIAGNOSIS — R4701 Aphasia: Secondary | ICD-10-CM | POA: Diagnosis not present

## 2021-08-22 DIAGNOSIS — R1312 Dysphagia, oropharyngeal phase: Secondary | ICD-10-CM | POA: Diagnosis not present

## 2021-08-24 DIAGNOSIS — H900 Conductive hearing loss, bilateral: Secondary | ICD-10-CM | POA: Diagnosis not present

## 2021-08-24 DIAGNOSIS — R1312 Dysphagia, oropharyngeal phase: Secondary | ICD-10-CM | POA: Diagnosis not present

## 2021-08-24 DIAGNOSIS — R4701 Aphasia: Secondary | ICD-10-CM | POA: Diagnosis not present

## 2021-08-27 DIAGNOSIS — R4701 Aphasia: Secondary | ICD-10-CM | POA: Diagnosis not present

## 2021-08-27 DIAGNOSIS — R1312 Dysphagia, oropharyngeal phase: Secondary | ICD-10-CM | POA: Diagnosis not present

## 2021-08-27 DIAGNOSIS — H900 Conductive hearing loss, bilateral: Secondary | ICD-10-CM | POA: Diagnosis not present

## 2021-08-29 DIAGNOSIS — H900 Conductive hearing loss, bilateral: Secondary | ICD-10-CM | POA: Diagnosis not present

## 2021-08-29 DIAGNOSIS — R4701 Aphasia: Secondary | ICD-10-CM | POA: Diagnosis not present

## 2021-08-29 DIAGNOSIS — R1312 Dysphagia, oropharyngeal phase: Secondary | ICD-10-CM | POA: Diagnosis not present

## 2021-08-30 DIAGNOSIS — H6123 Impacted cerumen, bilateral: Secondary | ICD-10-CM | POA: Diagnosis not present

## 2021-08-30 DIAGNOSIS — H900 Conductive hearing loss, bilateral: Secondary | ICD-10-CM | POA: Diagnosis not present

## 2021-08-30 DIAGNOSIS — R1312 Dysphagia, oropharyngeal phase: Secondary | ICD-10-CM | POA: Diagnosis not present

## 2021-08-30 DIAGNOSIS — R4701 Aphasia: Secondary | ICD-10-CM | POA: Diagnosis not present

## 2021-08-30 DIAGNOSIS — H6122 Impacted cerumen, left ear: Secondary | ICD-10-CM | POA: Diagnosis not present

## 2021-08-30 DIAGNOSIS — H6121 Impacted cerumen, right ear: Secondary | ICD-10-CM | POA: Diagnosis not present

## 2021-08-31 DIAGNOSIS — R1312 Dysphagia, oropharyngeal phase: Secondary | ICD-10-CM | POA: Diagnosis not present

## 2021-08-31 DIAGNOSIS — H900 Conductive hearing loss, bilateral: Secondary | ICD-10-CM | POA: Diagnosis not present

## 2021-08-31 DIAGNOSIS — R4701 Aphasia: Secondary | ICD-10-CM | POA: Diagnosis not present

## 2021-09-03 DIAGNOSIS — R1312 Dysphagia, oropharyngeal phase: Secondary | ICD-10-CM | POA: Diagnosis not present

## 2021-09-03 DIAGNOSIS — H900 Conductive hearing loss, bilateral: Secondary | ICD-10-CM | POA: Diagnosis not present

## 2021-09-03 DIAGNOSIS — R4701 Aphasia: Secondary | ICD-10-CM | POA: Diagnosis not present

## 2021-09-04 DIAGNOSIS — H900 Conductive hearing loss, bilateral: Secondary | ICD-10-CM | POA: Diagnosis not present

## 2021-09-04 DIAGNOSIS — R4701 Aphasia: Secondary | ICD-10-CM | POA: Diagnosis not present

## 2021-09-04 DIAGNOSIS — R1312 Dysphagia, oropharyngeal phase: Secondary | ICD-10-CM | POA: Diagnosis not present

## 2021-09-05 DIAGNOSIS — H900 Conductive hearing loss, bilateral: Secondary | ICD-10-CM | POA: Diagnosis not present

## 2021-09-05 DIAGNOSIS — R4701 Aphasia: Secondary | ICD-10-CM | POA: Diagnosis not present

## 2021-09-05 DIAGNOSIS — R1312 Dysphagia, oropharyngeal phase: Secondary | ICD-10-CM | POA: Diagnosis not present

## 2021-09-06 DIAGNOSIS — R1312 Dysphagia, oropharyngeal phase: Secondary | ICD-10-CM | POA: Diagnosis not present

## 2021-09-06 DIAGNOSIS — H900 Conductive hearing loss, bilateral: Secondary | ICD-10-CM | POA: Diagnosis not present

## 2021-09-06 DIAGNOSIS — R4701 Aphasia: Secondary | ICD-10-CM | POA: Diagnosis not present

## 2021-09-07 DIAGNOSIS — H900 Conductive hearing loss, bilateral: Secondary | ICD-10-CM | POA: Diagnosis not present

## 2021-09-07 DIAGNOSIS — R4701 Aphasia: Secondary | ICD-10-CM | POA: Diagnosis not present

## 2021-09-07 DIAGNOSIS — R1312 Dysphagia, oropharyngeal phase: Secondary | ICD-10-CM | POA: Diagnosis not present

## 2021-09-10 DIAGNOSIS — H900 Conductive hearing loss, bilateral: Secondary | ICD-10-CM | POA: Diagnosis not present

## 2021-09-10 DIAGNOSIS — M2041 Other hammer toe(s) (acquired), right foot: Secondary | ICD-10-CM | POA: Diagnosis not present

## 2021-09-10 DIAGNOSIS — R1312 Dysphagia, oropharyngeal phase: Secondary | ICD-10-CM | POA: Diagnosis not present

## 2021-09-10 DIAGNOSIS — R4701 Aphasia: Secondary | ICD-10-CM | POA: Diagnosis not present

## 2021-09-10 DIAGNOSIS — B351 Tinea unguium: Secondary | ICD-10-CM | POA: Diagnosis not present

## 2021-09-12 DIAGNOSIS — H900 Conductive hearing loss, bilateral: Secondary | ICD-10-CM | POA: Diagnosis not present

## 2021-09-12 DIAGNOSIS — R1312 Dysphagia, oropharyngeal phase: Secondary | ICD-10-CM | POA: Diagnosis not present

## 2021-09-12 DIAGNOSIS — R4701 Aphasia: Secondary | ICD-10-CM | POA: Diagnosis not present

## 2021-09-13 DIAGNOSIS — R4701 Aphasia: Secondary | ICD-10-CM | POA: Diagnosis not present

## 2021-09-13 DIAGNOSIS — H900 Conductive hearing loss, bilateral: Secondary | ICD-10-CM | POA: Diagnosis not present

## 2021-09-13 DIAGNOSIS — R1312 Dysphagia, oropharyngeal phase: Secondary | ICD-10-CM | POA: Diagnosis not present

## 2021-09-14 DIAGNOSIS — R1312 Dysphagia, oropharyngeal phase: Secondary | ICD-10-CM | POA: Diagnosis not present

## 2021-09-14 DIAGNOSIS — H900 Conductive hearing loss, bilateral: Secondary | ICD-10-CM | POA: Diagnosis not present

## 2021-09-14 DIAGNOSIS — R4701 Aphasia: Secondary | ICD-10-CM | POA: Diagnosis not present

## 2021-09-17 DIAGNOSIS — R4701 Aphasia: Secondary | ICD-10-CM | POA: Diagnosis not present

## 2021-09-17 DIAGNOSIS — H905 Unspecified sensorineural hearing loss: Secondary | ICD-10-CM | POA: Diagnosis not present

## 2021-09-17 DIAGNOSIS — H900 Conductive hearing loss, bilateral: Secondary | ICD-10-CM | POA: Diagnosis not present

## 2021-09-17 DIAGNOSIS — R1312 Dysphagia, oropharyngeal phase: Secondary | ICD-10-CM | POA: Diagnosis not present

## 2021-09-18 DIAGNOSIS — E559 Vitamin D deficiency, unspecified: Secondary | ICD-10-CM | POA: Diagnosis not present

## 2021-09-19 DIAGNOSIS — R4701 Aphasia: Secondary | ICD-10-CM | POA: Diagnosis not present

## 2021-09-19 DIAGNOSIS — R1312 Dysphagia, oropharyngeal phase: Secondary | ICD-10-CM | POA: Diagnosis not present

## 2021-09-19 DIAGNOSIS — H900 Conductive hearing loss, bilateral: Secondary | ICD-10-CM | POA: Diagnosis not present

## 2021-09-20 DIAGNOSIS — R1312 Dysphagia, oropharyngeal phase: Secondary | ICD-10-CM | POA: Diagnosis not present

## 2021-09-20 DIAGNOSIS — R4701 Aphasia: Secondary | ICD-10-CM | POA: Diagnosis not present

## 2021-09-20 DIAGNOSIS — H900 Conductive hearing loss, bilateral: Secondary | ICD-10-CM | POA: Diagnosis not present

## 2021-09-21 DIAGNOSIS — R4701 Aphasia: Secondary | ICD-10-CM | POA: Diagnosis not present

## 2021-09-21 DIAGNOSIS — H900 Conductive hearing loss, bilateral: Secondary | ICD-10-CM | POA: Diagnosis not present

## 2021-09-21 DIAGNOSIS — R1312 Dysphagia, oropharyngeal phase: Secondary | ICD-10-CM | POA: Diagnosis not present

## 2021-09-24 DIAGNOSIS — H900 Conductive hearing loss, bilateral: Secondary | ICD-10-CM | POA: Diagnosis not present

## 2021-09-24 DIAGNOSIS — R1312 Dysphagia, oropharyngeal phase: Secondary | ICD-10-CM | POA: Diagnosis not present

## 2021-09-24 DIAGNOSIS — R4701 Aphasia: Secondary | ICD-10-CM | POA: Diagnosis not present

## 2021-09-25 DIAGNOSIS — H900 Conductive hearing loss, bilateral: Secondary | ICD-10-CM | POA: Diagnosis not present

## 2021-09-25 DIAGNOSIS — R1312 Dysphagia, oropharyngeal phase: Secondary | ICD-10-CM | POA: Diagnosis not present

## 2021-09-25 DIAGNOSIS — R4701 Aphasia: Secondary | ICD-10-CM | POA: Diagnosis not present

## 2021-09-27 DIAGNOSIS — E559 Vitamin D deficiency, unspecified: Secondary | ICD-10-CM | POA: Diagnosis not present

## 2021-09-28 DIAGNOSIS — R1312 Dysphagia, oropharyngeal phase: Secondary | ICD-10-CM | POA: Diagnosis not present

## 2021-09-28 DIAGNOSIS — H900 Conductive hearing loss, bilateral: Secondary | ICD-10-CM | POA: Diagnosis not present

## 2021-09-28 DIAGNOSIS — R4701 Aphasia: Secondary | ICD-10-CM | POA: Diagnosis not present

## 2021-10-01 DIAGNOSIS — H900 Conductive hearing loss, bilateral: Secondary | ICD-10-CM | POA: Diagnosis not present

## 2021-10-01 DIAGNOSIS — R4701 Aphasia: Secondary | ICD-10-CM | POA: Diagnosis not present

## 2021-10-01 DIAGNOSIS — R1312 Dysphagia, oropharyngeal phase: Secondary | ICD-10-CM | POA: Diagnosis not present

## 2021-10-02 DIAGNOSIS — R4701 Aphasia: Secondary | ICD-10-CM | POA: Diagnosis not present

## 2021-10-02 DIAGNOSIS — H900 Conductive hearing loss, bilateral: Secondary | ICD-10-CM | POA: Diagnosis not present

## 2021-10-02 DIAGNOSIS — R1312 Dysphagia, oropharyngeal phase: Secondary | ICD-10-CM | POA: Diagnosis not present

## 2021-10-03 DIAGNOSIS — R1312 Dysphagia, oropharyngeal phase: Secondary | ICD-10-CM | POA: Diagnosis not present

## 2021-10-03 DIAGNOSIS — R4701 Aphasia: Secondary | ICD-10-CM | POA: Diagnosis not present

## 2021-10-03 DIAGNOSIS — H900 Conductive hearing loss, bilateral: Secondary | ICD-10-CM | POA: Diagnosis not present

## 2021-10-05 DIAGNOSIS — H900 Conductive hearing loss, bilateral: Secondary | ICD-10-CM | POA: Diagnosis not present

## 2021-10-05 DIAGNOSIS — R4701 Aphasia: Secondary | ICD-10-CM | POA: Diagnosis not present

## 2021-10-05 DIAGNOSIS — R1312 Dysphagia, oropharyngeal phase: Secondary | ICD-10-CM | POA: Diagnosis not present

## 2021-10-10 DIAGNOSIS — H900 Conductive hearing loss, bilateral: Secondary | ICD-10-CM | POA: Diagnosis not present

## 2021-10-10 DIAGNOSIS — R4701 Aphasia: Secondary | ICD-10-CM | POA: Diagnosis not present

## 2021-10-10 DIAGNOSIS — R1312 Dysphagia, oropharyngeal phase: Secondary | ICD-10-CM | POA: Diagnosis not present

## 2021-10-12 DIAGNOSIS — R4701 Aphasia: Secondary | ICD-10-CM | POA: Diagnosis not present

## 2021-10-12 DIAGNOSIS — R1312 Dysphagia, oropharyngeal phase: Secondary | ICD-10-CM | POA: Diagnosis not present

## 2021-10-12 DIAGNOSIS — H900 Conductive hearing loss, bilateral: Secondary | ICD-10-CM | POA: Diagnosis not present

## 2021-10-15 DIAGNOSIS — R1312 Dysphagia, oropharyngeal phase: Secondary | ICD-10-CM | POA: Diagnosis not present

## 2021-10-15 DIAGNOSIS — R4701 Aphasia: Secondary | ICD-10-CM | POA: Diagnosis not present

## 2021-10-15 DIAGNOSIS — H900 Conductive hearing loss, bilateral: Secondary | ICD-10-CM | POA: Diagnosis not present

## 2021-10-16 DIAGNOSIS — R1312 Dysphagia, oropharyngeal phase: Secondary | ICD-10-CM | POA: Diagnosis not present

## 2021-10-16 DIAGNOSIS — R4701 Aphasia: Secondary | ICD-10-CM | POA: Diagnosis not present

## 2021-10-16 DIAGNOSIS — H900 Conductive hearing loss, bilateral: Secondary | ICD-10-CM | POA: Diagnosis not present

## 2021-10-18 DIAGNOSIS — R4701 Aphasia: Secondary | ICD-10-CM | POA: Diagnosis not present

## 2021-10-18 DIAGNOSIS — H900 Conductive hearing loss, bilateral: Secondary | ICD-10-CM | POA: Diagnosis not present

## 2021-10-18 DIAGNOSIS — R1312 Dysphagia, oropharyngeal phase: Secondary | ICD-10-CM | POA: Diagnosis not present

## 2021-10-19 DIAGNOSIS — R4701 Aphasia: Secondary | ICD-10-CM | POA: Diagnosis not present

## 2021-10-19 DIAGNOSIS — H900 Conductive hearing loss, bilateral: Secondary | ICD-10-CM | POA: Diagnosis not present

## 2021-10-19 DIAGNOSIS — R1312 Dysphagia, oropharyngeal phase: Secondary | ICD-10-CM | POA: Diagnosis not present

## 2021-10-22 DIAGNOSIS — R1312 Dysphagia, oropharyngeal phase: Secondary | ICD-10-CM | POA: Diagnosis not present

## 2021-10-22 DIAGNOSIS — R4701 Aphasia: Secondary | ICD-10-CM | POA: Diagnosis not present

## 2021-10-22 DIAGNOSIS — H900 Conductive hearing loss, bilateral: Secondary | ICD-10-CM | POA: Diagnosis not present

## 2021-10-23 DIAGNOSIS — R1312 Dysphagia, oropharyngeal phase: Secondary | ICD-10-CM | POA: Diagnosis not present

## 2021-10-23 DIAGNOSIS — H900 Conductive hearing loss, bilateral: Secondary | ICD-10-CM | POA: Diagnosis not present

## 2021-10-23 DIAGNOSIS — R4701 Aphasia: Secondary | ICD-10-CM | POA: Diagnosis not present

## 2021-10-24 DIAGNOSIS — R1312 Dysphagia, oropharyngeal phase: Secondary | ICD-10-CM | POA: Diagnosis not present

## 2021-10-24 DIAGNOSIS — H900 Conductive hearing loss, bilateral: Secondary | ICD-10-CM | POA: Diagnosis not present

## 2021-10-24 DIAGNOSIS — R4701 Aphasia: Secondary | ICD-10-CM | POA: Diagnosis not present

## 2021-10-25 DIAGNOSIS — R4701 Aphasia: Secondary | ICD-10-CM | POA: Diagnosis not present

## 2021-10-25 DIAGNOSIS — R1312 Dysphagia, oropharyngeal phase: Secondary | ICD-10-CM | POA: Diagnosis not present

## 2021-10-25 DIAGNOSIS — H900 Conductive hearing loss, bilateral: Secondary | ICD-10-CM | POA: Diagnosis not present

## 2021-10-29 DIAGNOSIS — R4701 Aphasia: Secondary | ICD-10-CM | POA: Diagnosis not present

## 2021-10-29 DIAGNOSIS — R1312 Dysphagia, oropharyngeal phase: Secondary | ICD-10-CM | POA: Diagnosis not present

## 2021-10-29 DIAGNOSIS — H900 Conductive hearing loss, bilateral: Secondary | ICD-10-CM | POA: Diagnosis not present

## 2021-10-30 DIAGNOSIS — R1312 Dysphagia, oropharyngeal phase: Secondary | ICD-10-CM | POA: Diagnosis not present

## 2021-10-30 DIAGNOSIS — H900 Conductive hearing loss, bilateral: Secondary | ICD-10-CM | POA: Diagnosis not present

## 2021-10-30 DIAGNOSIS — R4701 Aphasia: Secondary | ICD-10-CM | POA: Diagnosis not present

## 2021-10-31 DIAGNOSIS — R4701 Aphasia: Secondary | ICD-10-CM | POA: Diagnosis not present

## 2021-10-31 DIAGNOSIS — R1312 Dysphagia, oropharyngeal phase: Secondary | ICD-10-CM | POA: Diagnosis not present

## 2021-10-31 DIAGNOSIS — H900 Conductive hearing loss, bilateral: Secondary | ICD-10-CM | POA: Diagnosis not present

## 2021-11-01 DIAGNOSIS — H900 Conductive hearing loss, bilateral: Secondary | ICD-10-CM | POA: Diagnosis not present

## 2021-11-01 DIAGNOSIS — R4701 Aphasia: Secondary | ICD-10-CM | POA: Diagnosis not present

## 2021-11-01 DIAGNOSIS — R1312 Dysphagia, oropharyngeal phase: Secondary | ICD-10-CM | POA: Diagnosis not present

## 2021-11-05 DIAGNOSIS — R1312 Dysphagia, oropharyngeal phase: Secondary | ICD-10-CM | POA: Diagnosis not present

## 2021-11-05 DIAGNOSIS — R4701 Aphasia: Secondary | ICD-10-CM | POA: Diagnosis not present

## 2021-11-05 DIAGNOSIS — H900 Conductive hearing loss, bilateral: Secondary | ICD-10-CM | POA: Diagnosis not present

## 2021-11-07 DIAGNOSIS — H900 Conductive hearing loss, bilateral: Secondary | ICD-10-CM | POA: Diagnosis not present

## 2021-11-07 DIAGNOSIS — R1312 Dysphagia, oropharyngeal phase: Secondary | ICD-10-CM | POA: Diagnosis not present

## 2021-11-07 DIAGNOSIS — R4701 Aphasia: Secondary | ICD-10-CM | POA: Diagnosis not present

## 2021-11-08 DIAGNOSIS — R1312 Dysphagia, oropharyngeal phase: Secondary | ICD-10-CM | POA: Diagnosis not present

## 2021-11-08 DIAGNOSIS — R4701 Aphasia: Secondary | ICD-10-CM | POA: Diagnosis not present

## 2021-11-08 DIAGNOSIS — H900 Conductive hearing loss, bilateral: Secondary | ICD-10-CM | POA: Diagnosis not present

## 2021-11-12 DIAGNOSIS — H900 Conductive hearing loss, bilateral: Secondary | ICD-10-CM | POA: Diagnosis not present

## 2021-11-12 DIAGNOSIS — R1312 Dysphagia, oropharyngeal phase: Secondary | ICD-10-CM | POA: Diagnosis not present

## 2021-11-12 DIAGNOSIS — R4701 Aphasia: Secondary | ICD-10-CM | POA: Diagnosis not present

## 2021-11-13 DIAGNOSIS — R4701 Aphasia: Secondary | ICD-10-CM | POA: Diagnosis not present

## 2021-11-13 DIAGNOSIS — R1312 Dysphagia, oropharyngeal phase: Secondary | ICD-10-CM | POA: Diagnosis not present

## 2021-11-13 DIAGNOSIS — H900 Conductive hearing loss, bilateral: Secondary | ICD-10-CM | POA: Diagnosis not present

## 2021-11-14 DIAGNOSIS — H900 Conductive hearing loss, bilateral: Secondary | ICD-10-CM | POA: Diagnosis not present

## 2021-11-14 DIAGNOSIS — R1312 Dysphagia, oropharyngeal phase: Secondary | ICD-10-CM | POA: Diagnosis not present

## 2021-11-14 DIAGNOSIS — R4701 Aphasia: Secondary | ICD-10-CM | POA: Diagnosis not present

## 2021-11-15 DIAGNOSIS — R1312 Dysphagia, oropharyngeal phase: Secondary | ICD-10-CM | POA: Diagnosis not present

## 2021-11-15 DIAGNOSIS — H900 Conductive hearing loss, bilateral: Secondary | ICD-10-CM | POA: Diagnosis not present

## 2021-11-15 DIAGNOSIS — R4701 Aphasia: Secondary | ICD-10-CM | POA: Diagnosis not present

## 2021-11-19 DIAGNOSIS — R4701 Aphasia: Secondary | ICD-10-CM | POA: Diagnosis not present

## 2021-11-19 DIAGNOSIS — H900 Conductive hearing loss, bilateral: Secondary | ICD-10-CM | POA: Diagnosis not present

## 2021-11-19 DIAGNOSIS — R1312 Dysphagia, oropharyngeal phase: Secondary | ICD-10-CM | POA: Diagnosis not present

## 2021-11-20 DIAGNOSIS — H900 Conductive hearing loss, bilateral: Secondary | ICD-10-CM | POA: Diagnosis not present

## 2021-11-20 DIAGNOSIS — R4701 Aphasia: Secondary | ICD-10-CM | POA: Diagnosis not present

## 2021-11-20 DIAGNOSIS — R1312 Dysphagia, oropharyngeal phase: Secondary | ICD-10-CM | POA: Diagnosis not present

## 2021-11-21 DIAGNOSIS — H900 Conductive hearing loss, bilateral: Secondary | ICD-10-CM | POA: Diagnosis not present

## 2021-11-21 DIAGNOSIS — R1312 Dysphagia, oropharyngeal phase: Secondary | ICD-10-CM | POA: Diagnosis not present

## 2021-11-21 DIAGNOSIS — R4701 Aphasia: Secondary | ICD-10-CM | POA: Diagnosis not present

## 2021-11-22 DIAGNOSIS — H900 Conductive hearing loss, bilateral: Secondary | ICD-10-CM | POA: Diagnosis not present

## 2021-11-22 DIAGNOSIS — R1312 Dysphagia, oropharyngeal phase: Secondary | ICD-10-CM | POA: Diagnosis not present

## 2021-11-22 DIAGNOSIS — R4701 Aphasia: Secondary | ICD-10-CM | POA: Diagnosis not present

## 2021-11-26 DIAGNOSIS — R1312 Dysphagia, oropharyngeal phase: Secondary | ICD-10-CM | POA: Diagnosis not present

## 2021-11-26 DIAGNOSIS — H900 Conductive hearing loss, bilateral: Secondary | ICD-10-CM | POA: Diagnosis not present

## 2021-11-26 DIAGNOSIS — R4701 Aphasia: Secondary | ICD-10-CM | POA: Diagnosis not present

## 2021-11-27 DIAGNOSIS — R1312 Dysphagia, oropharyngeal phase: Secondary | ICD-10-CM | POA: Diagnosis not present

## 2021-11-27 DIAGNOSIS — R4701 Aphasia: Secondary | ICD-10-CM | POA: Diagnosis not present

## 2021-11-27 DIAGNOSIS — H900 Conductive hearing loss, bilateral: Secondary | ICD-10-CM | POA: Diagnosis not present

## 2021-11-29 DIAGNOSIS — H900 Conductive hearing loss, bilateral: Secondary | ICD-10-CM | POA: Diagnosis not present

## 2021-11-29 DIAGNOSIS — R4701 Aphasia: Secondary | ICD-10-CM | POA: Diagnosis not present

## 2021-11-29 DIAGNOSIS — R1312 Dysphagia, oropharyngeal phase: Secondary | ICD-10-CM | POA: Diagnosis not present

## 2021-12-03 DIAGNOSIS — H900 Conductive hearing loss, bilateral: Secondary | ICD-10-CM | POA: Diagnosis not present

## 2021-12-03 DIAGNOSIS — R4701 Aphasia: Secondary | ICD-10-CM | POA: Diagnosis not present

## 2021-12-03 DIAGNOSIS — R1312 Dysphagia, oropharyngeal phase: Secondary | ICD-10-CM | POA: Diagnosis not present

## 2021-12-04 DIAGNOSIS — R4701 Aphasia: Secondary | ICD-10-CM | POA: Diagnosis not present

## 2021-12-04 DIAGNOSIS — H900 Conductive hearing loss, bilateral: Secondary | ICD-10-CM | POA: Diagnosis not present

## 2021-12-04 DIAGNOSIS — R1312 Dysphagia, oropharyngeal phase: Secondary | ICD-10-CM | POA: Diagnosis not present

## 2021-12-05 DIAGNOSIS — R4701 Aphasia: Secondary | ICD-10-CM | POA: Diagnosis not present

## 2021-12-05 DIAGNOSIS — R1312 Dysphagia, oropharyngeal phase: Secondary | ICD-10-CM | POA: Diagnosis not present

## 2021-12-05 DIAGNOSIS — H900 Conductive hearing loss, bilateral: Secondary | ICD-10-CM | POA: Diagnosis not present

## 2021-12-06 DIAGNOSIS — R1312 Dysphagia, oropharyngeal phase: Secondary | ICD-10-CM | POA: Diagnosis not present

## 2021-12-06 DIAGNOSIS — R4701 Aphasia: Secondary | ICD-10-CM | POA: Diagnosis not present

## 2021-12-06 DIAGNOSIS — H900 Conductive hearing loss, bilateral: Secondary | ICD-10-CM | POA: Diagnosis not present

## 2021-12-10 DIAGNOSIS — R1312 Dysphagia, oropharyngeal phase: Secondary | ICD-10-CM | POA: Diagnosis not present

## 2021-12-10 DIAGNOSIS — H900 Conductive hearing loss, bilateral: Secondary | ICD-10-CM | POA: Diagnosis not present

## 2021-12-10 DIAGNOSIS — R4701 Aphasia: Secondary | ICD-10-CM | POA: Diagnosis not present

## 2021-12-11 DIAGNOSIS — H900 Conductive hearing loss, bilateral: Secondary | ICD-10-CM | POA: Diagnosis not present

## 2021-12-11 DIAGNOSIS — R4701 Aphasia: Secondary | ICD-10-CM | POA: Diagnosis not present

## 2021-12-11 DIAGNOSIS — R1312 Dysphagia, oropharyngeal phase: Secondary | ICD-10-CM | POA: Diagnosis not present

## 2021-12-12 DIAGNOSIS — R1312 Dysphagia, oropharyngeal phase: Secondary | ICD-10-CM | POA: Diagnosis not present

## 2021-12-12 DIAGNOSIS — H900 Conductive hearing loss, bilateral: Secondary | ICD-10-CM | POA: Diagnosis not present

## 2021-12-12 DIAGNOSIS — R4701 Aphasia: Secondary | ICD-10-CM | POA: Diagnosis not present

## 2021-12-13 DIAGNOSIS — R1312 Dysphagia, oropharyngeal phase: Secondary | ICD-10-CM | POA: Diagnosis not present

## 2021-12-13 DIAGNOSIS — R4701 Aphasia: Secondary | ICD-10-CM | POA: Diagnosis not present

## 2021-12-13 DIAGNOSIS — H900 Conductive hearing loss, bilateral: Secondary | ICD-10-CM | POA: Diagnosis not present

## 2021-12-17 DIAGNOSIS — B351 Tinea unguium: Secondary | ICD-10-CM | POA: Diagnosis not present

## 2021-12-17 DIAGNOSIS — R4701 Aphasia: Secondary | ICD-10-CM | POA: Diagnosis not present

## 2021-12-17 DIAGNOSIS — R1312 Dysphagia, oropharyngeal phase: Secondary | ICD-10-CM | POA: Diagnosis not present

## 2021-12-17 DIAGNOSIS — H900 Conductive hearing loss, bilateral: Secondary | ICD-10-CM | POA: Diagnosis not present

## 2021-12-18 DIAGNOSIS — H900 Conductive hearing loss, bilateral: Secondary | ICD-10-CM | POA: Diagnosis not present

## 2021-12-18 DIAGNOSIS — R4701 Aphasia: Secondary | ICD-10-CM | POA: Diagnosis not present

## 2021-12-18 DIAGNOSIS — R1312 Dysphagia, oropharyngeal phase: Secondary | ICD-10-CM | POA: Diagnosis not present

## 2021-12-19 DIAGNOSIS — H900 Conductive hearing loss, bilateral: Secondary | ICD-10-CM | POA: Diagnosis not present

## 2021-12-19 DIAGNOSIS — R1312 Dysphagia, oropharyngeal phase: Secondary | ICD-10-CM | POA: Diagnosis not present

## 2021-12-19 DIAGNOSIS — R4701 Aphasia: Secondary | ICD-10-CM | POA: Diagnosis not present

## 2021-12-20 DIAGNOSIS — H900 Conductive hearing loss, bilateral: Secondary | ICD-10-CM | POA: Diagnosis not present

## 2021-12-20 DIAGNOSIS — R4701 Aphasia: Secondary | ICD-10-CM | POA: Diagnosis not present

## 2021-12-20 DIAGNOSIS — R1312 Dysphagia, oropharyngeal phase: Secondary | ICD-10-CM | POA: Diagnosis not present

## 2021-12-22 DIAGNOSIS — M79642 Pain in left hand: Secondary | ICD-10-CM | POA: Diagnosis not present

## 2021-12-22 DIAGNOSIS — M19042 Primary osteoarthritis, left hand: Secondary | ICD-10-CM | POA: Diagnosis not present

## 2021-12-22 DIAGNOSIS — R2232 Localized swelling, mass and lump, left upper limb: Secondary | ICD-10-CM | POA: Diagnosis not present

## 2021-12-24 DIAGNOSIS — R4701 Aphasia: Secondary | ICD-10-CM | POA: Diagnosis not present

## 2021-12-24 DIAGNOSIS — H900 Conductive hearing loss, bilateral: Secondary | ICD-10-CM | POA: Diagnosis not present

## 2021-12-24 DIAGNOSIS — R1312 Dysphagia, oropharyngeal phase: Secondary | ICD-10-CM | POA: Diagnosis not present

## 2021-12-25 DIAGNOSIS — H900 Conductive hearing loss, bilateral: Secondary | ICD-10-CM | POA: Diagnosis not present

## 2021-12-25 DIAGNOSIS — R4701 Aphasia: Secondary | ICD-10-CM | POA: Diagnosis not present

## 2021-12-25 DIAGNOSIS — R1312 Dysphagia, oropharyngeal phase: Secondary | ICD-10-CM | POA: Diagnosis not present

## 2021-12-26 DIAGNOSIS — R4701 Aphasia: Secondary | ICD-10-CM | POA: Diagnosis not present

## 2021-12-26 DIAGNOSIS — R1312 Dysphagia, oropharyngeal phase: Secondary | ICD-10-CM | POA: Diagnosis not present

## 2021-12-26 DIAGNOSIS — H900 Conductive hearing loss, bilateral: Secondary | ICD-10-CM | POA: Diagnosis not present

## 2021-12-27 DIAGNOSIS — F32A Depression, unspecified: Secondary | ICD-10-CM | POA: Diagnosis not present

## 2021-12-27 DIAGNOSIS — E559 Vitamin D deficiency, unspecified: Secondary | ICD-10-CM | POA: Diagnosis not present

## 2021-12-27 DIAGNOSIS — R1312 Dysphagia, oropharyngeal phase: Secondary | ICD-10-CM | POA: Diagnosis not present

## 2021-12-27 DIAGNOSIS — F03918 Unspecified dementia, unspecified severity, with other behavioral disturbance: Secondary | ICD-10-CM | POA: Diagnosis not present

## 2021-12-27 DIAGNOSIS — I4891 Unspecified atrial fibrillation: Secondary | ICD-10-CM | POA: Diagnosis not present

## 2021-12-27 DIAGNOSIS — H900 Conductive hearing loss, bilateral: Secondary | ICD-10-CM | POA: Diagnosis not present

## 2021-12-27 DIAGNOSIS — G4700 Insomnia, unspecified: Secondary | ICD-10-CM | POA: Diagnosis not present

## 2021-12-27 DIAGNOSIS — S6992XD Unspecified injury of left wrist, hand and finger(s), subsequent encounter: Secondary | ICD-10-CM | POA: Diagnosis not present

## 2021-12-27 DIAGNOSIS — R4701 Aphasia: Secondary | ICD-10-CM | POA: Diagnosis not present

## 2021-12-27 DIAGNOSIS — N183 Chronic kidney disease, stage 3 unspecified: Secondary | ICD-10-CM | POA: Diagnosis not present

## 2021-12-27 DIAGNOSIS — F0393 Unspecified dementia, unspecified severity, with mood disturbance: Secondary | ICD-10-CM | POA: Diagnosis not present

## 2021-12-31 DIAGNOSIS — R1312 Dysphagia, oropharyngeal phase: Secondary | ICD-10-CM | POA: Diagnosis not present

## 2021-12-31 DIAGNOSIS — H900 Conductive hearing loss, bilateral: Secondary | ICD-10-CM | POA: Diagnosis not present

## 2021-12-31 DIAGNOSIS — R4701 Aphasia: Secondary | ICD-10-CM | POA: Diagnosis not present

## 2022-01-01 DIAGNOSIS — R4701 Aphasia: Secondary | ICD-10-CM | POA: Diagnosis not present

## 2022-01-01 DIAGNOSIS — H900 Conductive hearing loss, bilateral: Secondary | ICD-10-CM | POA: Diagnosis not present

## 2022-01-01 DIAGNOSIS — R1312 Dysphagia, oropharyngeal phase: Secondary | ICD-10-CM | POA: Diagnosis not present

## 2022-01-02 DIAGNOSIS — H900 Conductive hearing loss, bilateral: Secondary | ICD-10-CM | POA: Diagnosis not present

## 2022-01-02 DIAGNOSIS — R1312 Dysphagia, oropharyngeal phase: Secondary | ICD-10-CM | POA: Diagnosis not present

## 2022-01-02 DIAGNOSIS — R4701 Aphasia: Secondary | ICD-10-CM | POA: Diagnosis not present

## 2022-01-03 DIAGNOSIS — H900 Conductive hearing loss, bilateral: Secondary | ICD-10-CM | POA: Diagnosis not present

## 2022-01-03 DIAGNOSIS — R1312 Dysphagia, oropharyngeal phase: Secondary | ICD-10-CM | POA: Diagnosis not present

## 2022-01-03 DIAGNOSIS — R4701 Aphasia: Secondary | ICD-10-CM | POA: Diagnosis not present

## 2022-01-04 DIAGNOSIS — H900 Conductive hearing loss, bilateral: Secondary | ICD-10-CM | POA: Diagnosis not present

## 2022-01-04 DIAGNOSIS — R4701 Aphasia: Secondary | ICD-10-CM | POA: Diagnosis not present

## 2022-01-04 DIAGNOSIS — R1312 Dysphagia, oropharyngeal phase: Secondary | ICD-10-CM | POA: Diagnosis not present

## 2022-01-07 DIAGNOSIS — R1312 Dysphagia, oropharyngeal phase: Secondary | ICD-10-CM | POA: Diagnosis not present

## 2022-01-07 DIAGNOSIS — R4701 Aphasia: Secondary | ICD-10-CM | POA: Diagnosis not present

## 2022-01-07 DIAGNOSIS — H900 Conductive hearing loss, bilateral: Secondary | ICD-10-CM | POA: Diagnosis not present

## 2022-01-08 DIAGNOSIS — H900 Conductive hearing loss, bilateral: Secondary | ICD-10-CM | POA: Diagnosis not present

## 2022-01-08 DIAGNOSIS — R4701 Aphasia: Secondary | ICD-10-CM | POA: Diagnosis not present

## 2022-01-08 DIAGNOSIS — R1312 Dysphagia, oropharyngeal phase: Secondary | ICD-10-CM | POA: Diagnosis not present

## 2022-01-09 DIAGNOSIS — R1312 Dysphagia, oropharyngeal phase: Secondary | ICD-10-CM | POA: Diagnosis not present

## 2022-01-09 DIAGNOSIS — R4701 Aphasia: Secondary | ICD-10-CM | POA: Diagnosis not present

## 2022-01-09 DIAGNOSIS — H900 Conductive hearing loss, bilateral: Secondary | ICD-10-CM | POA: Diagnosis not present

## 2022-01-10 DIAGNOSIS — R1312 Dysphagia, oropharyngeal phase: Secondary | ICD-10-CM | POA: Diagnosis not present

## 2022-01-10 DIAGNOSIS — H900 Conductive hearing loss, bilateral: Secondary | ICD-10-CM | POA: Diagnosis not present

## 2022-01-10 DIAGNOSIS — R4701 Aphasia: Secondary | ICD-10-CM | POA: Diagnosis not present

## 2022-01-11 DIAGNOSIS — R1312 Dysphagia, oropharyngeal phase: Secondary | ICD-10-CM | POA: Diagnosis not present

## 2022-01-11 DIAGNOSIS — H900 Conductive hearing loss, bilateral: Secondary | ICD-10-CM | POA: Diagnosis not present

## 2022-01-11 DIAGNOSIS — R4701 Aphasia: Secondary | ICD-10-CM | POA: Diagnosis not present

## 2022-01-14 DIAGNOSIS — R1312 Dysphagia, oropharyngeal phase: Secondary | ICD-10-CM | POA: Diagnosis not present

## 2022-01-14 DIAGNOSIS — H900 Conductive hearing loss, bilateral: Secondary | ICD-10-CM | POA: Diagnosis not present

## 2022-01-14 DIAGNOSIS — R4701 Aphasia: Secondary | ICD-10-CM | POA: Diagnosis not present

## 2022-01-15 DIAGNOSIS — H900 Conductive hearing loss, bilateral: Secondary | ICD-10-CM | POA: Diagnosis not present

## 2022-01-15 DIAGNOSIS — R4701 Aphasia: Secondary | ICD-10-CM | POA: Diagnosis not present

## 2022-01-15 DIAGNOSIS — R1312 Dysphagia, oropharyngeal phase: Secondary | ICD-10-CM | POA: Diagnosis not present

## 2022-01-16 DIAGNOSIS — H900 Conductive hearing loss, bilateral: Secondary | ICD-10-CM | POA: Diagnosis not present

## 2022-01-16 DIAGNOSIS — R1312 Dysphagia, oropharyngeal phase: Secondary | ICD-10-CM | POA: Diagnosis not present

## 2022-01-16 DIAGNOSIS — R4701 Aphasia: Secondary | ICD-10-CM | POA: Diagnosis not present

## 2022-01-17 DIAGNOSIS — R1312 Dysphagia, oropharyngeal phase: Secondary | ICD-10-CM | POA: Diagnosis not present

## 2022-01-17 DIAGNOSIS — R4701 Aphasia: Secondary | ICD-10-CM | POA: Diagnosis not present

## 2022-01-17 DIAGNOSIS — H900 Conductive hearing loss, bilateral: Secondary | ICD-10-CM | POA: Diagnosis not present

## 2022-01-18 DIAGNOSIS — H900 Conductive hearing loss, bilateral: Secondary | ICD-10-CM | POA: Diagnosis not present

## 2022-01-18 DIAGNOSIS — R1312 Dysphagia, oropharyngeal phase: Secondary | ICD-10-CM | POA: Diagnosis not present

## 2022-01-18 DIAGNOSIS — R4701 Aphasia: Secondary | ICD-10-CM | POA: Diagnosis not present

## 2022-01-21 DIAGNOSIS — R4701 Aphasia: Secondary | ICD-10-CM | POA: Diagnosis not present

## 2022-01-21 DIAGNOSIS — R1312 Dysphagia, oropharyngeal phase: Secondary | ICD-10-CM | POA: Diagnosis not present

## 2022-01-21 DIAGNOSIS — H900 Conductive hearing loss, bilateral: Secondary | ICD-10-CM | POA: Diagnosis not present

## 2022-01-22 DIAGNOSIS — H900 Conductive hearing loss, bilateral: Secondary | ICD-10-CM | POA: Diagnosis not present

## 2022-01-22 DIAGNOSIS — R1312 Dysphagia, oropharyngeal phase: Secondary | ICD-10-CM | POA: Diagnosis not present

## 2022-01-22 DIAGNOSIS — R4701 Aphasia: Secondary | ICD-10-CM | POA: Diagnosis not present

## 2022-01-23 DIAGNOSIS — R4701 Aphasia: Secondary | ICD-10-CM | POA: Diagnosis not present

## 2022-01-23 DIAGNOSIS — H900 Conductive hearing loss, bilateral: Secondary | ICD-10-CM | POA: Diagnosis not present

## 2022-01-23 DIAGNOSIS — R1312 Dysphagia, oropharyngeal phase: Secondary | ICD-10-CM | POA: Diagnosis not present

## 2022-01-24 DIAGNOSIS — H900 Conductive hearing loss, bilateral: Secondary | ICD-10-CM | POA: Diagnosis not present

## 2022-01-24 DIAGNOSIS — R4701 Aphasia: Secondary | ICD-10-CM | POA: Diagnosis not present

## 2022-01-24 DIAGNOSIS — R1312 Dysphagia, oropharyngeal phase: Secondary | ICD-10-CM | POA: Diagnosis not present

## 2022-01-25 DIAGNOSIS — H900 Conductive hearing loss, bilateral: Secondary | ICD-10-CM | POA: Diagnosis not present

## 2022-01-25 DIAGNOSIS — R1312 Dysphagia, oropharyngeal phase: Secondary | ICD-10-CM | POA: Diagnosis not present

## 2022-01-25 DIAGNOSIS — R4701 Aphasia: Secondary | ICD-10-CM | POA: Diagnosis not present

## 2022-01-28 DIAGNOSIS — R4701 Aphasia: Secondary | ICD-10-CM | POA: Diagnosis not present

## 2022-01-28 DIAGNOSIS — R1312 Dysphagia, oropharyngeal phase: Secondary | ICD-10-CM | POA: Diagnosis not present

## 2022-01-28 DIAGNOSIS — H900 Conductive hearing loss, bilateral: Secondary | ICD-10-CM | POA: Diagnosis not present

## 2022-01-29 DIAGNOSIS — H900 Conductive hearing loss, bilateral: Secondary | ICD-10-CM | POA: Diagnosis not present

## 2022-01-29 DIAGNOSIS — R1312 Dysphagia, oropharyngeal phase: Secondary | ICD-10-CM | POA: Diagnosis not present

## 2022-01-29 DIAGNOSIS — R4701 Aphasia: Secondary | ICD-10-CM | POA: Diagnosis not present

## 2022-01-30 DIAGNOSIS — R4701 Aphasia: Secondary | ICD-10-CM | POA: Diagnosis not present

## 2022-01-30 DIAGNOSIS — H900 Conductive hearing loss, bilateral: Secondary | ICD-10-CM | POA: Diagnosis not present

## 2022-01-30 DIAGNOSIS — R1312 Dysphagia, oropharyngeal phase: Secondary | ICD-10-CM | POA: Diagnosis not present

## 2022-01-31 DIAGNOSIS — F32A Depression, unspecified: Secondary | ICD-10-CM | POA: Diagnosis not present

## 2022-01-31 DIAGNOSIS — I4891 Unspecified atrial fibrillation: Secondary | ICD-10-CM | POA: Diagnosis not present

## 2022-01-31 DIAGNOSIS — N183 Chronic kidney disease, stage 3 unspecified: Secondary | ICD-10-CM | POA: Diagnosis not present

## 2022-01-31 DIAGNOSIS — R1312 Dysphagia, oropharyngeal phase: Secondary | ICD-10-CM | POA: Diagnosis not present

## 2022-01-31 DIAGNOSIS — G4700 Insomnia, unspecified: Secondary | ICD-10-CM | POA: Diagnosis not present

## 2022-01-31 DIAGNOSIS — R4701 Aphasia: Secondary | ICD-10-CM | POA: Diagnosis not present

## 2022-01-31 DIAGNOSIS — F0393 Unspecified dementia, unspecified severity, with mood disturbance: Secondary | ICD-10-CM | POA: Diagnosis not present

## 2022-01-31 DIAGNOSIS — H900 Conductive hearing loss, bilateral: Secondary | ICD-10-CM | POA: Diagnosis not present

## 2022-01-31 DIAGNOSIS — R0981 Nasal congestion: Secondary | ICD-10-CM | POA: Diagnosis not present

## 2022-01-31 DIAGNOSIS — J069 Acute upper respiratory infection, unspecified: Secondary | ICD-10-CM | POA: Diagnosis not present

## 2022-01-31 DIAGNOSIS — F03918 Unspecified dementia, unspecified severity, with other behavioral disturbance: Secondary | ICD-10-CM | POA: Diagnosis not present

## 2022-02-01 DIAGNOSIS — H900 Conductive hearing loss, bilateral: Secondary | ICD-10-CM | POA: Diagnosis not present

## 2022-02-01 DIAGNOSIS — R4701 Aphasia: Secondary | ICD-10-CM | POA: Diagnosis not present

## 2022-02-01 DIAGNOSIS — R1312 Dysphagia, oropharyngeal phase: Secondary | ICD-10-CM | POA: Diagnosis not present

## 2022-02-04 DIAGNOSIS — R4701 Aphasia: Secondary | ICD-10-CM | POA: Diagnosis not present

## 2022-02-04 DIAGNOSIS — H900 Conductive hearing loss, bilateral: Secondary | ICD-10-CM | POA: Diagnosis not present

## 2022-02-04 DIAGNOSIS — R1312 Dysphagia, oropharyngeal phase: Secondary | ICD-10-CM | POA: Diagnosis not present

## 2022-02-05 DIAGNOSIS — H900 Conductive hearing loss, bilateral: Secondary | ICD-10-CM | POA: Diagnosis not present

## 2022-02-05 DIAGNOSIS — R4701 Aphasia: Secondary | ICD-10-CM | POA: Diagnosis not present

## 2022-02-05 DIAGNOSIS — R1312 Dysphagia, oropharyngeal phase: Secondary | ICD-10-CM | POA: Diagnosis not present

## 2022-02-06 DIAGNOSIS — R4701 Aphasia: Secondary | ICD-10-CM | POA: Diagnosis not present

## 2022-02-06 DIAGNOSIS — H900 Conductive hearing loss, bilateral: Secondary | ICD-10-CM | POA: Diagnosis not present

## 2022-02-06 DIAGNOSIS — R1312 Dysphagia, oropharyngeal phase: Secondary | ICD-10-CM | POA: Diagnosis not present

## 2022-02-07 DIAGNOSIS — H900 Conductive hearing loss, bilateral: Secondary | ICD-10-CM | POA: Diagnosis not present

## 2022-02-07 DIAGNOSIS — R4701 Aphasia: Secondary | ICD-10-CM | POA: Diagnosis not present

## 2022-02-07 DIAGNOSIS — R1312 Dysphagia, oropharyngeal phase: Secondary | ICD-10-CM | POA: Diagnosis not present

## 2022-02-08 DIAGNOSIS — R1312 Dysphagia, oropharyngeal phase: Secondary | ICD-10-CM | POA: Diagnosis not present

## 2022-02-08 DIAGNOSIS — H900 Conductive hearing loss, bilateral: Secondary | ICD-10-CM | POA: Diagnosis not present

## 2022-02-08 DIAGNOSIS — R4701 Aphasia: Secondary | ICD-10-CM | POA: Diagnosis not present

## 2022-02-11 DIAGNOSIS — H900 Conductive hearing loss, bilateral: Secondary | ICD-10-CM | POA: Diagnosis not present

## 2022-02-11 DIAGNOSIS — R1312 Dysphagia, oropharyngeal phase: Secondary | ICD-10-CM | POA: Diagnosis not present

## 2022-02-11 DIAGNOSIS — R4701 Aphasia: Secondary | ICD-10-CM | POA: Diagnosis not present

## 2022-02-12 DIAGNOSIS — R4701 Aphasia: Secondary | ICD-10-CM | POA: Diagnosis not present

## 2022-02-12 DIAGNOSIS — R1312 Dysphagia, oropharyngeal phase: Secondary | ICD-10-CM | POA: Diagnosis not present

## 2022-02-12 DIAGNOSIS — H900 Conductive hearing loss, bilateral: Secondary | ICD-10-CM | POA: Diagnosis not present

## 2022-02-13 DIAGNOSIS — R1312 Dysphagia, oropharyngeal phase: Secondary | ICD-10-CM | POA: Diagnosis not present

## 2022-02-13 DIAGNOSIS — H900 Conductive hearing loss, bilateral: Secondary | ICD-10-CM | POA: Diagnosis not present

## 2022-02-13 DIAGNOSIS — R4701 Aphasia: Secondary | ICD-10-CM | POA: Diagnosis not present

## 2022-02-14 DIAGNOSIS — H900 Conductive hearing loss, bilateral: Secondary | ICD-10-CM | POA: Diagnosis not present

## 2022-02-14 DIAGNOSIS — R1312 Dysphagia, oropharyngeal phase: Secondary | ICD-10-CM | POA: Diagnosis not present

## 2022-02-14 DIAGNOSIS — R4701 Aphasia: Secondary | ICD-10-CM | POA: Diagnosis not present

## 2022-02-18 DIAGNOSIS — H900 Conductive hearing loss, bilateral: Secondary | ICD-10-CM | POA: Diagnosis not present

## 2022-02-18 DIAGNOSIS — R1312 Dysphagia, oropharyngeal phase: Secondary | ICD-10-CM | POA: Diagnosis not present

## 2022-02-18 DIAGNOSIS — R4701 Aphasia: Secondary | ICD-10-CM | POA: Diagnosis not present

## 2022-02-19 DIAGNOSIS — H900 Conductive hearing loss, bilateral: Secondary | ICD-10-CM | POA: Diagnosis not present

## 2022-02-19 DIAGNOSIS — R4701 Aphasia: Secondary | ICD-10-CM | POA: Diagnosis not present

## 2022-02-19 DIAGNOSIS — R1312 Dysphagia, oropharyngeal phase: Secondary | ICD-10-CM | POA: Diagnosis not present

## 2022-02-20 DIAGNOSIS — R4701 Aphasia: Secondary | ICD-10-CM | POA: Diagnosis not present

## 2022-02-20 DIAGNOSIS — H900 Conductive hearing loss, bilateral: Secondary | ICD-10-CM | POA: Diagnosis not present

## 2022-02-20 DIAGNOSIS — R1312 Dysphagia, oropharyngeal phase: Secondary | ICD-10-CM | POA: Diagnosis not present

## 2022-02-21 DIAGNOSIS — R1312 Dysphagia, oropharyngeal phase: Secondary | ICD-10-CM | POA: Diagnosis not present

## 2022-02-21 DIAGNOSIS — H900 Conductive hearing loss, bilateral: Secondary | ICD-10-CM | POA: Diagnosis not present

## 2022-02-21 DIAGNOSIS — R4701 Aphasia: Secondary | ICD-10-CM | POA: Diagnosis not present

## 2022-02-25 DIAGNOSIS — R4701 Aphasia: Secondary | ICD-10-CM | POA: Diagnosis not present

## 2022-02-25 DIAGNOSIS — H900 Conductive hearing loss, bilateral: Secondary | ICD-10-CM | POA: Diagnosis not present

## 2022-02-25 DIAGNOSIS — R1312 Dysphagia, oropharyngeal phase: Secondary | ICD-10-CM | POA: Diagnosis not present

## 2022-02-26 DIAGNOSIS — R4701 Aphasia: Secondary | ICD-10-CM | POA: Diagnosis not present

## 2022-02-26 DIAGNOSIS — R1312 Dysphagia, oropharyngeal phase: Secondary | ICD-10-CM | POA: Diagnosis not present

## 2022-02-26 DIAGNOSIS — H900 Conductive hearing loss, bilateral: Secondary | ICD-10-CM | POA: Diagnosis not present

## 2022-02-28 DIAGNOSIS — H900 Conductive hearing loss, bilateral: Secondary | ICD-10-CM | POA: Diagnosis not present

## 2022-02-28 DIAGNOSIS — R1312 Dysphagia, oropharyngeal phase: Secondary | ICD-10-CM | POA: Diagnosis not present

## 2022-02-28 DIAGNOSIS — R4701 Aphasia: Secondary | ICD-10-CM | POA: Diagnosis not present

## 2022-03-01 DIAGNOSIS — H900 Conductive hearing loss, bilateral: Secondary | ICD-10-CM | POA: Diagnosis not present

## 2022-03-01 DIAGNOSIS — R1312 Dysphagia, oropharyngeal phase: Secondary | ICD-10-CM | POA: Diagnosis not present

## 2022-03-01 DIAGNOSIS — R4701 Aphasia: Secondary | ICD-10-CM | POA: Diagnosis not present

## 2022-03-04 DIAGNOSIS — R4701 Aphasia: Secondary | ICD-10-CM | POA: Diagnosis not present

## 2022-03-04 DIAGNOSIS — R1312 Dysphagia, oropharyngeal phase: Secondary | ICD-10-CM | POA: Diagnosis not present

## 2022-03-04 DIAGNOSIS — H900 Conductive hearing loss, bilateral: Secondary | ICD-10-CM | POA: Diagnosis not present

## 2022-03-05 DIAGNOSIS — H900 Conductive hearing loss, bilateral: Secondary | ICD-10-CM | POA: Diagnosis not present

## 2022-03-05 DIAGNOSIS — R4701 Aphasia: Secondary | ICD-10-CM | POA: Diagnosis not present

## 2022-03-05 DIAGNOSIS — R1312 Dysphagia, oropharyngeal phase: Secondary | ICD-10-CM | POA: Diagnosis not present

## 2022-03-06 DIAGNOSIS — R4701 Aphasia: Secondary | ICD-10-CM | POA: Diagnosis not present

## 2022-03-06 DIAGNOSIS — R1312 Dysphagia, oropharyngeal phase: Secondary | ICD-10-CM | POA: Diagnosis not present

## 2022-03-06 DIAGNOSIS — H900 Conductive hearing loss, bilateral: Secondary | ICD-10-CM | POA: Diagnosis not present

## 2022-03-07 DIAGNOSIS — R1312 Dysphagia, oropharyngeal phase: Secondary | ICD-10-CM | POA: Diagnosis not present

## 2022-03-07 DIAGNOSIS — H900 Conductive hearing loss, bilateral: Secondary | ICD-10-CM | POA: Diagnosis not present

## 2022-03-07 DIAGNOSIS — R4701 Aphasia: Secondary | ICD-10-CM | POA: Diagnosis not present

## 2022-03-11 DIAGNOSIS — R1312 Dysphagia, oropharyngeal phase: Secondary | ICD-10-CM | POA: Diagnosis not present

## 2022-03-11 DIAGNOSIS — R4701 Aphasia: Secondary | ICD-10-CM | POA: Diagnosis not present

## 2022-03-11 DIAGNOSIS — H900 Conductive hearing loss, bilateral: Secondary | ICD-10-CM | POA: Diagnosis not present

## 2022-03-12 DIAGNOSIS — G4700 Insomnia, unspecified: Secondary | ICD-10-CM | POA: Diagnosis not present

## 2022-03-12 DIAGNOSIS — Z0189 Encounter for other specified special examinations: Secondary | ICD-10-CM | POA: Diagnosis not present

## 2022-03-12 DIAGNOSIS — R1312 Dysphagia, oropharyngeal phase: Secondary | ICD-10-CM | POA: Diagnosis not present

## 2022-03-12 DIAGNOSIS — R4701 Aphasia: Secondary | ICD-10-CM | POA: Diagnosis not present

## 2022-03-12 DIAGNOSIS — J069 Acute upper respiratory infection, unspecified: Secondary | ICD-10-CM | POA: Diagnosis not present

## 2022-03-12 DIAGNOSIS — I4891 Unspecified atrial fibrillation: Secondary | ICD-10-CM | POA: Diagnosis not present

## 2022-03-12 DIAGNOSIS — E785 Hyperlipidemia, unspecified: Secondary | ICD-10-CM | POA: Diagnosis not present

## 2022-03-12 DIAGNOSIS — Z8719 Personal history of other diseases of the digestive system: Secondary | ICD-10-CM | POA: Diagnosis not present

## 2022-03-12 DIAGNOSIS — H900 Conductive hearing loss, bilateral: Secondary | ICD-10-CM | POA: Diagnosis not present

## 2022-03-12 DIAGNOSIS — I129 Hypertensive chronic kidney disease with stage 1 through stage 4 chronic kidney disease, or unspecified chronic kidney disease: Secondary | ICD-10-CM | POA: Diagnosis not present

## 2022-03-12 DIAGNOSIS — E559 Vitamin D deficiency, unspecified: Secondary | ICD-10-CM | POA: Diagnosis not present

## 2022-03-12 DIAGNOSIS — F0393 Unspecified dementia, unspecified severity, with mood disturbance: Secondary | ICD-10-CM | POA: Diagnosis not present

## 2022-03-12 DIAGNOSIS — N183 Chronic kidney disease, stage 3 unspecified: Secondary | ICD-10-CM | POA: Diagnosis not present

## 2022-03-13 DIAGNOSIS — R1312 Dysphagia, oropharyngeal phase: Secondary | ICD-10-CM | POA: Diagnosis not present

## 2022-03-13 DIAGNOSIS — R4701 Aphasia: Secondary | ICD-10-CM | POA: Diagnosis not present

## 2022-03-13 DIAGNOSIS — H900 Conductive hearing loss, bilateral: Secondary | ICD-10-CM | POA: Diagnosis not present

## 2022-03-14 DIAGNOSIS — H900 Conductive hearing loss, bilateral: Secondary | ICD-10-CM | POA: Diagnosis not present

## 2022-03-14 DIAGNOSIS — R1312 Dysphagia, oropharyngeal phase: Secondary | ICD-10-CM | POA: Diagnosis not present

## 2022-03-14 DIAGNOSIS — R4701 Aphasia: Secondary | ICD-10-CM | POA: Diagnosis not present

## 2022-03-18 DIAGNOSIS — R1312 Dysphagia, oropharyngeal phase: Secondary | ICD-10-CM | POA: Diagnosis not present

## 2022-03-18 DIAGNOSIS — R4701 Aphasia: Secondary | ICD-10-CM | POA: Diagnosis not present

## 2022-03-18 DIAGNOSIS — H900 Conductive hearing loss, bilateral: Secondary | ICD-10-CM | POA: Diagnosis not present

## 2022-03-19 DIAGNOSIS — F0393 Unspecified dementia, unspecified severity, with mood disturbance: Secondary | ICD-10-CM | POA: Diagnosis not present

## 2022-03-19 DIAGNOSIS — H900 Conductive hearing loss, bilateral: Secondary | ICD-10-CM | POA: Diagnosis not present

## 2022-03-19 DIAGNOSIS — E559 Vitamin D deficiency, unspecified: Secondary | ICD-10-CM | POA: Diagnosis not present

## 2022-03-19 DIAGNOSIS — D508 Other iron deficiency anemias: Secondary | ICD-10-CM | POA: Diagnosis not present

## 2022-03-19 DIAGNOSIS — R4701 Aphasia: Secondary | ICD-10-CM | POA: Diagnosis not present

## 2022-03-19 DIAGNOSIS — E039 Hypothyroidism, unspecified: Secondary | ICD-10-CM | POA: Diagnosis not present

## 2022-03-19 DIAGNOSIS — R040 Epistaxis: Secondary | ICD-10-CM | POA: Diagnosis not present

## 2022-03-19 DIAGNOSIS — R1312 Dysphagia, oropharyngeal phase: Secondary | ICD-10-CM | POA: Diagnosis not present

## 2022-03-20 DIAGNOSIS — H900 Conductive hearing loss, bilateral: Secondary | ICD-10-CM | POA: Diagnosis not present

## 2022-03-20 DIAGNOSIS — R1312 Dysphagia, oropharyngeal phase: Secondary | ICD-10-CM | POA: Diagnosis not present

## 2022-03-20 DIAGNOSIS — H905 Unspecified sensorineural hearing loss: Secondary | ICD-10-CM | POA: Diagnosis not present

## 2022-03-20 DIAGNOSIS — R4701 Aphasia: Secondary | ICD-10-CM | POA: Diagnosis not present

## 2022-03-21 DIAGNOSIS — R1312 Dysphagia, oropharyngeal phase: Secondary | ICD-10-CM | POA: Diagnosis not present

## 2022-03-21 DIAGNOSIS — H900 Conductive hearing loss, bilateral: Secondary | ICD-10-CM | POA: Diagnosis not present

## 2022-03-21 DIAGNOSIS — R4701 Aphasia: Secondary | ICD-10-CM | POA: Diagnosis not present

## 2022-03-22 DIAGNOSIS — H900 Conductive hearing loss, bilateral: Secondary | ICD-10-CM | POA: Diagnosis not present

## 2022-03-22 DIAGNOSIS — R4701 Aphasia: Secondary | ICD-10-CM | POA: Diagnosis not present

## 2022-03-22 DIAGNOSIS — R1312 Dysphagia, oropharyngeal phase: Secondary | ICD-10-CM | POA: Diagnosis not present

## 2022-03-25 DIAGNOSIS — R1312 Dysphagia, oropharyngeal phase: Secondary | ICD-10-CM | POA: Diagnosis not present

## 2022-03-25 DIAGNOSIS — H900 Conductive hearing loss, bilateral: Secondary | ICD-10-CM | POA: Diagnosis not present

## 2022-03-25 DIAGNOSIS — R4701 Aphasia: Secondary | ICD-10-CM | POA: Diagnosis not present

## 2022-03-26 DIAGNOSIS — R1312 Dysphagia, oropharyngeal phase: Secondary | ICD-10-CM | POA: Diagnosis not present

## 2022-03-26 DIAGNOSIS — H900 Conductive hearing loss, bilateral: Secondary | ICD-10-CM | POA: Diagnosis not present

## 2022-03-26 DIAGNOSIS — R4701 Aphasia: Secondary | ICD-10-CM | POA: Diagnosis not present

## 2022-03-27 DIAGNOSIS — R4701 Aphasia: Secondary | ICD-10-CM | POA: Diagnosis not present

## 2022-03-27 DIAGNOSIS — R1312 Dysphagia, oropharyngeal phase: Secondary | ICD-10-CM | POA: Diagnosis not present

## 2022-03-27 DIAGNOSIS — H900 Conductive hearing loss, bilateral: Secondary | ICD-10-CM | POA: Diagnosis not present

## 2022-03-28 DIAGNOSIS — H900 Conductive hearing loss, bilateral: Secondary | ICD-10-CM | POA: Diagnosis not present

## 2022-03-28 DIAGNOSIS — R1312 Dysphagia, oropharyngeal phase: Secondary | ICD-10-CM | POA: Diagnosis not present

## 2022-03-28 DIAGNOSIS — R4701 Aphasia: Secondary | ICD-10-CM | POA: Diagnosis not present

## 2022-03-29 DIAGNOSIS — R4701 Aphasia: Secondary | ICD-10-CM | POA: Diagnosis not present

## 2022-03-29 DIAGNOSIS — H900 Conductive hearing loss, bilateral: Secondary | ICD-10-CM | POA: Diagnosis not present

## 2022-03-29 DIAGNOSIS — R1312 Dysphagia, oropharyngeal phase: Secondary | ICD-10-CM | POA: Diagnosis not present

## 2022-04-01 DIAGNOSIS — R4701 Aphasia: Secondary | ICD-10-CM | POA: Diagnosis not present

## 2022-04-01 DIAGNOSIS — R1312 Dysphagia, oropharyngeal phase: Secondary | ICD-10-CM | POA: Diagnosis not present

## 2022-04-01 DIAGNOSIS — H900 Conductive hearing loss, bilateral: Secondary | ICD-10-CM | POA: Diagnosis not present

## 2022-04-02 DIAGNOSIS — R1312 Dysphagia, oropharyngeal phase: Secondary | ICD-10-CM | POA: Diagnosis not present

## 2022-04-02 DIAGNOSIS — R4701 Aphasia: Secondary | ICD-10-CM | POA: Diagnosis not present

## 2022-04-02 DIAGNOSIS — H900 Conductive hearing loss, bilateral: Secondary | ICD-10-CM | POA: Diagnosis not present

## 2022-04-03 DIAGNOSIS — H900 Conductive hearing loss, bilateral: Secondary | ICD-10-CM | POA: Diagnosis not present

## 2022-04-03 DIAGNOSIS — R1312 Dysphagia, oropharyngeal phase: Secondary | ICD-10-CM | POA: Diagnosis not present

## 2022-04-03 DIAGNOSIS — R4701 Aphasia: Secondary | ICD-10-CM | POA: Diagnosis not present

## 2022-04-04 DIAGNOSIS — H900 Conductive hearing loss, bilateral: Secondary | ICD-10-CM | POA: Diagnosis not present

## 2022-04-04 DIAGNOSIS — R4701 Aphasia: Secondary | ICD-10-CM | POA: Diagnosis not present

## 2022-04-04 DIAGNOSIS — R1312 Dysphagia, oropharyngeal phase: Secondary | ICD-10-CM | POA: Diagnosis not present

## 2022-04-05 DIAGNOSIS — H900 Conductive hearing loss, bilateral: Secondary | ICD-10-CM | POA: Diagnosis not present

## 2022-04-05 DIAGNOSIS — R4701 Aphasia: Secondary | ICD-10-CM | POA: Diagnosis not present

## 2022-04-05 DIAGNOSIS — R1312 Dysphagia, oropharyngeal phase: Secondary | ICD-10-CM | POA: Diagnosis not present

## 2022-04-08 DIAGNOSIS — R1312 Dysphagia, oropharyngeal phase: Secondary | ICD-10-CM | POA: Diagnosis not present

## 2022-04-08 DIAGNOSIS — R4701 Aphasia: Secondary | ICD-10-CM | POA: Diagnosis not present

## 2022-04-08 DIAGNOSIS — H900 Conductive hearing loss, bilateral: Secondary | ICD-10-CM | POA: Diagnosis not present

## 2022-04-09 DIAGNOSIS — N189 Chronic kidney disease, unspecified: Secondary | ICD-10-CM | POA: Diagnosis not present

## 2022-04-09 DIAGNOSIS — B351 Tinea unguium: Secondary | ICD-10-CM | POA: Diagnosis not present

## 2022-04-09 DIAGNOSIS — H900 Conductive hearing loss, bilateral: Secondary | ICD-10-CM | POA: Diagnosis not present

## 2022-04-09 DIAGNOSIS — R1312 Dysphagia, oropharyngeal phase: Secondary | ICD-10-CM | POA: Diagnosis not present

## 2022-04-09 DIAGNOSIS — R4701 Aphasia: Secondary | ICD-10-CM | POA: Diagnosis not present

## 2022-04-09 DIAGNOSIS — L84 Corns and callosities: Secondary | ICD-10-CM | POA: Diagnosis not present

## 2022-04-10 DIAGNOSIS — R1312 Dysphagia, oropharyngeal phase: Secondary | ICD-10-CM | POA: Diagnosis not present

## 2022-04-10 DIAGNOSIS — H900 Conductive hearing loss, bilateral: Secondary | ICD-10-CM | POA: Diagnosis not present

## 2022-04-10 DIAGNOSIS — R4701 Aphasia: Secondary | ICD-10-CM | POA: Diagnosis not present

## 2022-04-11 DIAGNOSIS — H900 Conductive hearing loss, bilateral: Secondary | ICD-10-CM | POA: Diagnosis not present

## 2022-04-11 DIAGNOSIS — R1312 Dysphagia, oropharyngeal phase: Secondary | ICD-10-CM | POA: Diagnosis not present

## 2022-04-11 DIAGNOSIS — R4701 Aphasia: Secondary | ICD-10-CM | POA: Diagnosis not present

## 2022-04-12 DIAGNOSIS — H900 Conductive hearing loss, bilateral: Secondary | ICD-10-CM | POA: Diagnosis not present

## 2022-04-12 DIAGNOSIS — R4701 Aphasia: Secondary | ICD-10-CM | POA: Diagnosis not present

## 2022-04-12 DIAGNOSIS — R1312 Dysphagia, oropharyngeal phase: Secondary | ICD-10-CM | POA: Diagnosis not present

## 2022-04-15 DIAGNOSIS — R4701 Aphasia: Secondary | ICD-10-CM | POA: Diagnosis not present

## 2022-04-15 DIAGNOSIS — H900 Conductive hearing loss, bilateral: Secondary | ICD-10-CM | POA: Diagnosis not present

## 2022-04-15 DIAGNOSIS — R1312 Dysphagia, oropharyngeal phase: Secondary | ICD-10-CM | POA: Diagnosis not present

## 2022-04-16 DIAGNOSIS — R4701 Aphasia: Secondary | ICD-10-CM | POA: Diagnosis not present

## 2022-04-16 DIAGNOSIS — R1312 Dysphagia, oropharyngeal phase: Secondary | ICD-10-CM | POA: Diagnosis not present

## 2022-04-16 DIAGNOSIS — Z7189 Other specified counseling: Secondary | ICD-10-CM | POA: Diagnosis not present

## 2022-04-16 DIAGNOSIS — L988 Other specified disorders of the skin and subcutaneous tissue: Secondary | ICD-10-CM | POA: Diagnosis not present

## 2022-04-16 DIAGNOSIS — H900 Conductive hearing loss, bilateral: Secondary | ICD-10-CM | POA: Diagnosis not present

## 2022-04-16 DIAGNOSIS — F32A Depression, unspecified: Secondary | ICD-10-CM | POA: Diagnosis not present

## 2022-04-16 DIAGNOSIS — Z66 Do not resuscitate: Secondary | ICD-10-CM | POA: Diagnosis not present

## 2022-04-16 DIAGNOSIS — E559 Vitamin D deficiency, unspecified: Secondary | ICD-10-CM | POA: Diagnosis not present

## 2022-04-16 DIAGNOSIS — Z8719 Personal history of other diseases of the digestive system: Secondary | ICD-10-CM | POA: Diagnosis not present

## 2022-04-16 DIAGNOSIS — R040 Epistaxis: Secondary | ICD-10-CM | POA: Diagnosis not present

## 2022-04-16 DIAGNOSIS — F0393 Unspecified dementia, unspecified severity, with mood disturbance: Secondary | ICD-10-CM | POA: Diagnosis not present

## 2022-04-17 DIAGNOSIS — H900 Conductive hearing loss, bilateral: Secondary | ICD-10-CM | POA: Diagnosis not present

## 2022-04-17 DIAGNOSIS — R4701 Aphasia: Secondary | ICD-10-CM | POA: Diagnosis not present

## 2022-04-17 DIAGNOSIS — R1312 Dysphagia, oropharyngeal phase: Secondary | ICD-10-CM | POA: Diagnosis not present

## 2022-04-18 DIAGNOSIS — H900 Conductive hearing loss, bilateral: Secondary | ICD-10-CM | POA: Diagnosis not present

## 2022-04-18 DIAGNOSIS — R4701 Aphasia: Secondary | ICD-10-CM | POA: Diagnosis not present

## 2022-04-18 DIAGNOSIS — R1312 Dysphagia, oropharyngeal phase: Secondary | ICD-10-CM | POA: Diagnosis not present

## 2022-04-19 DIAGNOSIS — R4701 Aphasia: Secondary | ICD-10-CM | POA: Diagnosis not present

## 2022-04-19 DIAGNOSIS — R1312 Dysphagia, oropharyngeal phase: Secondary | ICD-10-CM | POA: Diagnosis not present

## 2022-04-19 DIAGNOSIS — H900 Conductive hearing loss, bilateral: Secondary | ICD-10-CM | POA: Diagnosis not present

## 2022-04-22 DIAGNOSIS — R4701 Aphasia: Secondary | ICD-10-CM | POA: Diagnosis not present

## 2022-04-22 DIAGNOSIS — R1312 Dysphagia, oropharyngeal phase: Secondary | ICD-10-CM | POA: Diagnosis not present

## 2022-04-22 DIAGNOSIS — H900 Conductive hearing loss, bilateral: Secondary | ICD-10-CM | POA: Diagnosis not present

## 2022-04-23 DIAGNOSIS — H900 Conductive hearing loss, bilateral: Secondary | ICD-10-CM | POA: Diagnosis not present

## 2022-04-23 DIAGNOSIS — R4701 Aphasia: Secondary | ICD-10-CM | POA: Diagnosis not present

## 2022-04-23 DIAGNOSIS — R1312 Dysphagia, oropharyngeal phase: Secondary | ICD-10-CM | POA: Diagnosis not present

## 2022-04-24 DIAGNOSIS — R4701 Aphasia: Secondary | ICD-10-CM | POA: Diagnosis not present

## 2022-04-24 DIAGNOSIS — R1312 Dysphagia, oropharyngeal phase: Secondary | ICD-10-CM | POA: Diagnosis not present

## 2022-04-24 DIAGNOSIS — H900 Conductive hearing loss, bilateral: Secondary | ICD-10-CM | POA: Diagnosis not present

## 2022-04-25 DIAGNOSIS — R4701 Aphasia: Secondary | ICD-10-CM | POA: Diagnosis not present

## 2022-04-25 DIAGNOSIS — H900 Conductive hearing loss, bilateral: Secondary | ICD-10-CM | POA: Diagnosis not present

## 2022-04-25 DIAGNOSIS — R1312 Dysphagia, oropharyngeal phase: Secondary | ICD-10-CM | POA: Diagnosis not present

## 2022-04-26 DIAGNOSIS — R1312 Dysphagia, oropharyngeal phase: Secondary | ICD-10-CM | POA: Diagnosis not present

## 2022-04-26 DIAGNOSIS — R4701 Aphasia: Secondary | ICD-10-CM | POA: Diagnosis not present

## 2022-04-26 DIAGNOSIS — H900 Conductive hearing loss, bilateral: Secondary | ICD-10-CM | POA: Diagnosis not present

## 2022-04-29 DIAGNOSIS — R4701 Aphasia: Secondary | ICD-10-CM | POA: Diagnosis not present

## 2022-04-29 DIAGNOSIS — R1312 Dysphagia, oropharyngeal phase: Secondary | ICD-10-CM | POA: Diagnosis not present

## 2022-04-29 DIAGNOSIS — H900 Conductive hearing loss, bilateral: Secondary | ICD-10-CM | POA: Diagnosis not present

## 2022-04-30 DIAGNOSIS — H900 Conductive hearing loss, bilateral: Secondary | ICD-10-CM | POA: Diagnosis not present

## 2022-04-30 DIAGNOSIS — R4701 Aphasia: Secondary | ICD-10-CM | POA: Diagnosis not present

## 2022-04-30 DIAGNOSIS — R1312 Dysphagia, oropharyngeal phase: Secondary | ICD-10-CM | POA: Diagnosis not present

## 2022-05-01 DIAGNOSIS — R4701 Aphasia: Secondary | ICD-10-CM | POA: Diagnosis not present

## 2022-05-01 DIAGNOSIS — H900 Conductive hearing loss, bilateral: Secondary | ICD-10-CM | POA: Diagnosis not present

## 2022-05-01 DIAGNOSIS — R1312 Dysphagia, oropharyngeal phase: Secondary | ICD-10-CM | POA: Diagnosis not present

## 2022-05-02 DIAGNOSIS — R1312 Dysphagia, oropharyngeal phase: Secondary | ICD-10-CM | POA: Diagnosis not present

## 2022-05-02 DIAGNOSIS — R4701 Aphasia: Secondary | ICD-10-CM | POA: Diagnosis not present

## 2022-05-02 DIAGNOSIS — H900 Conductive hearing loss, bilateral: Secondary | ICD-10-CM | POA: Diagnosis not present

## 2022-05-06 DIAGNOSIS — R1312 Dysphagia, oropharyngeal phase: Secondary | ICD-10-CM | POA: Diagnosis not present

## 2022-05-06 DIAGNOSIS — R4701 Aphasia: Secondary | ICD-10-CM | POA: Diagnosis not present

## 2022-05-06 DIAGNOSIS — H900 Conductive hearing loss, bilateral: Secondary | ICD-10-CM | POA: Diagnosis not present

## 2022-05-08 DIAGNOSIS — R4701 Aphasia: Secondary | ICD-10-CM | POA: Diagnosis not present

## 2022-05-08 DIAGNOSIS — H900 Conductive hearing loss, bilateral: Secondary | ICD-10-CM | POA: Diagnosis not present

## 2022-05-08 DIAGNOSIS — R1312 Dysphagia, oropharyngeal phase: Secondary | ICD-10-CM | POA: Diagnosis not present

## 2022-05-10 DIAGNOSIS — R4701 Aphasia: Secondary | ICD-10-CM | POA: Diagnosis not present

## 2022-05-10 DIAGNOSIS — H900 Conductive hearing loss, bilateral: Secondary | ICD-10-CM | POA: Diagnosis not present

## 2022-05-10 DIAGNOSIS — R1312 Dysphagia, oropharyngeal phase: Secondary | ICD-10-CM | POA: Diagnosis not present

## 2022-05-13 DIAGNOSIS — H900 Conductive hearing loss, bilateral: Secondary | ICD-10-CM | POA: Diagnosis not present

## 2022-05-13 DIAGNOSIS — R4701 Aphasia: Secondary | ICD-10-CM | POA: Diagnosis not present

## 2022-05-13 DIAGNOSIS — R1312 Dysphagia, oropharyngeal phase: Secondary | ICD-10-CM | POA: Diagnosis not present

## 2022-05-15 DIAGNOSIS — R4701 Aphasia: Secondary | ICD-10-CM | POA: Diagnosis not present

## 2022-05-15 DIAGNOSIS — H900 Conductive hearing loss, bilateral: Secondary | ICD-10-CM | POA: Diagnosis not present

## 2022-05-15 DIAGNOSIS — R1312 Dysphagia, oropharyngeal phase: Secondary | ICD-10-CM | POA: Diagnosis not present

## 2022-05-17 DIAGNOSIS — R1312 Dysphagia, oropharyngeal phase: Secondary | ICD-10-CM | POA: Diagnosis not present

## 2022-05-17 DIAGNOSIS — H900 Conductive hearing loss, bilateral: Secondary | ICD-10-CM | POA: Diagnosis not present

## 2022-05-17 DIAGNOSIS — R4701 Aphasia: Secondary | ICD-10-CM | POA: Diagnosis not present

## 2022-05-20 DIAGNOSIS — R1312 Dysphagia, oropharyngeal phase: Secondary | ICD-10-CM | POA: Diagnosis not present

## 2022-05-20 DIAGNOSIS — H900 Conductive hearing loss, bilateral: Secondary | ICD-10-CM | POA: Diagnosis not present

## 2022-05-20 DIAGNOSIS — R4701 Aphasia: Secondary | ICD-10-CM | POA: Diagnosis not present

## 2022-05-22 DIAGNOSIS — R4701 Aphasia: Secondary | ICD-10-CM | POA: Diagnosis not present

## 2022-05-22 DIAGNOSIS — H900 Conductive hearing loss, bilateral: Secondary | ICD-10-CM | POA: Diagnosis not present

## 2022-05-22 DIAGNOSIS — R1312 Dysphagia, oropharyngeal phase: Secondary | ICD-10-CM | POA: Diagnosis not present

## 2022-05-24 DIAGNOSIS — R1312 Dysphagia, oropharyngeal phase: Secondary | ICD-10-CM | POA: Diagnosis not present

## 2022-05-24 DIAGNOSIS — R4701 Aphasia: Secondary | ICD-10-CM | POA: Diagnosis not present

## 2022-05-24 DIAGNOSIS — H900 Conductive hearing loss, bilateral: Secondary | ICD-10-CM | POA: Diagnosis not present

## 2022-05-27 DIAGNOSIS — H900 Conductive hearing loss, bilateral: Secondary | ICD-10-CM | POA: Diagnosis not present

## 2022-05-27 DIAGNOSIS — R4701 Aphasia: Secondary | ICD-10-CM | POA: Diagnosis not present

## 2022-05-27 DIAGNOSIS — R1312 Dysphagia, oropharyngeal phase: Secondary | ICD-10-CM | POA: Diagnosis not present

## 2022-05-28 DIAGNOSIS — F32A Depression, unspecified: Secondary | ICD-10-CM | POA: Diagnosis not present

## 2022-05-28 DIAGNOSIS — M1990 Unspecified osteoarthritis, unspecified site: Secondary | ICD-10-CM | POA: Diagnosis not present

## 2022-05-28 DIAGNOSIS — E559 Vitamin D deficiency, unspecified: Secondary | ICD-10-CM | POA: Diagnosis not present

## 2022-05-28 DIAGNOSIS — Z6820 Body mass index (BMI) 20.0-20.9, adult: Secondary | ICD-10-CM | POA: Diagnosis not present

## 2022-05-28 DIAGNOSIS — Z79899 Other long term (current) drug therapy: Secondary | ICD-10-CM | POA: Diagnosis not present

## 2022-05-28 DIAGNOSIS — I10 Essential (primary) hypertension: Secondary | ICD-10-CM | POA: Diagnosis not present

## 2022-05-28 DIAGNOSIS — L988 Other specified disorders of the skin and subcutaneous tissue: Secondary | ICD-10-CM | POA: Diagnosis not present

## 2022-05-28 DIAGNOSIS — Z8719 Personal history of other diseases of the digestive system: Secondary | ICD-10-CM | POA: Diagnosis not present

## 2022-05-28 DIAGNOSIS — R2689 Other abnormalities of gait and mobility: Secondary | ICD-10-CM | POA: Diagnosis not present

## 2022-05-29 DIAGNOSIS — H900 Conductive hearing loss, bilateral: Secondary | ICD-10-CM | POA: Diagnosis not present

## 2022-05-29 DIAGNOSIS — R4701 Aphasia: Secondary | ICD-10-CM | POA: Diagnosis not present

## 2022-05-29 DIAGNOSIS — R1312 Dysphagia, oropharyngeal phase: Secondary | ICD-10-CM | POA: Diagnosis not present

## 2022-05-31 DIAGNOSIS — R4701 Aphasia: Secondary | ICD-10-CM | POA: Diagnosis not present

## 2022-05-31 DIAGNOSIS — R1312 Dysphagia, oropharyngeal phase: Secondary | ICD-10-CM | POA: Diagnosis not present

## 2022-05-31 DIAGNOSIS — H900 Conductive hearing loss, bilateral: Secondary | ICD-10-CM | POA: Diagnosis not present

## 2022-06-03 DIAGNOSIS — H900 Conductive hearing loss, bilateral: Secondary | ICD-10-CM | POA: Diagnosis not present

## 2022-06-03 DIAGNOSIS — R1312 Dysphagia, oropharyngeal phase: Secondary | ICD-10-CM | POA: Diagnosis not present

## 2022-06-03 DIAGNOSIS — R4701 Aphasia: Secondary | ICD-10-CM | POA: Diagnosis not present

## 2022-06-05 DIAGNOSIS — H900 Conductive hearing loss, bilateral: Secondary | ICD-10-CM | POA: Diagnosis not present

## 2022-06-05 DIAGNOSIS — R1312 Dysphagia, oropharyngeal phase: Secondary | ICD-10-CM | POA: Diagnosis not present

## 2022-06-05 DIAGNOSIS — R4701 Aphasia: Secondary | ICD-10-CM | POA: Diagnosis not present

## 2022-06-07 DIAGNOSIS — R4701 Aphasia: Secondary | ICD-10-CM | POA: Diagnosis not present

## 2022-06-07 DIAGNOSIS — R1312 Dysphagia, oropharyngeal phase: Secondary | ICD-10-CM | POA: Diagnosis not present

## 2022-06-07 DIAGNOSIS — H900 Conductive hearing loss, bilateral: Secondary | ICD-10-CM | POA: Diagnosis not present

## 2022-06-10 DIAGNOSIS — H900 Conductive hearing loss, bilateral: Secondary | ICD-10-CM | POA: Diagnosis not present

## 2022-06-10 DIAGNOSIS — R1312 Dysphagia, oropharyngeal phase: Secondary | ICD-10-CM | POA: Diagnosis not present

## 2022-06-10 DIAGNOSIS — R4701 Aphasia: Secondary | ICD-10-CM | POA: Diagnosis not present

## 2022-06-11 DIAGNOSIS — E559 Vitamin D deficiency, unspecified: Secondary | ICD-10-CM | POA: Diagnosis not present

## 2022-06-11 DIAGNOSIS — D519 Vitamin B12 deficiency anemia, unspecified: Secondary | ICD-10-CM | POA: Diagnosis not present

## 2022-06-12 DIAGNOSIS — R1312 Dysphagia, oropharyngeal phase: Secondary | ICD-10-CM | POA: Diagnosis not present

## 2022-06-12 DIAGNOSIS — H900 Conductive hearing loss, bilateral: Secondary | ICD-10-CM | POA: Diagnosis not present

## 2022-06-12 DIAGNOSIS — R4701 Aphasia: Secondary | ICD-10-CM | POA: Diagnosis not present

## 2022-06-14 DIAGNOSIS — R1312 Dysphagia, oropharyngeal phase: Secondary | ICD-10-CM | POA: Diagnosis not present

## 2022-06-14 DIAGNOSIS — R4701 Aphasia: Secondary | ICD-10-CM | POA: Diagnosis not present

## 2022-06-14 DIAGNOSIS — H900 Conductive hearing loss, bilateral: Secondary | ICD-10-CM | POA: Diagnosis not present

## 2022-06-17 DIAGNOSIS — R1312 Dysphagia, oropharyngeal phase: Secondary | ICD-10-CM | POA: Diagnosis not present

## 2022-06-17 DIAGNOSIS — R4701 Aphasia: Secondary | ICD-10-CM | POA: Diagnosis not present

## 2022-06-17 DIAGNOSIS — H900 Conductive hearing loss, bilateral: Secondary | ICD-10-CM | POA: Diagnosis not present

## 2022-06-20 DIAGNOSIS — R1312 Dysphagia, oropharyngeal phase: Secondary | ICD-10-CM | POA: Diagnosis not present

## 2022-06-20 DIAGNOSIS — R4701 Aphasia: Secondary | ICD-10-CM | POA: Diagnosis not present

## 2022-06-20 DIAGNOSIS — H900 Conductive hearing loss, bilateral: Secondary | ICD-10-CM | POA: Diagnosis not present

## 2022-06-21 DIAGNOSIS — R1312 Dysphagia, oropharyngeal phase: Secondary | ICD-10-CM | POA: Diagnosis not present

## 2022-06-21 DIAGNOSIS — H900 Conductive hearing loss, bilateral: Secondary | ICD-10-CM | POA: Diagnosis not present

## 2022-06-21 DIAGNOSIS — R4701 Aphasia: Secondary | ICD-10-CM | POA: Diagnosis not present

## 2022-06-24 DIAGNOSIS — R1312 Dysphagia, oropharyngeal phase: Secondary | ICD-10-CM | POA: Diagnosis not present

## 2022-06-24 DIAGNOSIS — H900 Conductive hearing loss, bilateral: Secondary | ICD-10-CM | POA: Diagnosis not present

## 2022-06-24 DIAGNOSIS — R4701 Aphasia: Secondary | ICD-10-CM | POA: Diagnosis not present

## 2022-06-25 DIAGNOSIS — H900 Conductive hearing loss, bilateral: Secondary | ICD-10-CM | POA: Diagnosis not present

## 2022-06-25 DIAGNOSIS — R1312 Dysphagia, oropharyngeal phase: Secondary | ICD-10-CM | POA: Diagnosis not present

## 2022-06-25 DIAGNOSIS — R4701 Aphasia: Secondary | ICD-10-CM | POA: Diagnosis not present

## 2022-06-27 DIAGNOSIS — R4701 Aphasia: Secondary | ICD-10-CM | POA: Diagnosis not present

## 2022-06-27 DIAGNOSIS — R1312 Dysphagia, oropharyngeal phase: Secondary | ICD-10-CM | POA: Diagnosis not present

## 2022-06-27 DIAGNOSIS — H900 Conductive hearing loss, bilateral: Secondary | ICD-10-CM | POA: Diagnosis not present

## 2022-07-01 DIAGNOSIS — R1312 Dysphagia, oropharyngeal phase: Secondary | ICD-10-CM | POA: Diagnosis not present

## 2022-07-01 DIAGNOSIS — H900 Conductive hearing loss, bilateral: Secondary | ICD-10-CM | POA: Diagnosis not present

## 2022-07-01 DIAGNOSIS — R4701 Aphasia: Secondary | ICD-10-CM | POA: Diagnosis not present

## 2022-07-03 DIAGNOSIS — H900 Conductive hearing loss, bilateral: Secondary | ICD-10-CM | POA: Diagnosis not present

## 2022-07-03 DIAGNOSIS — R1312 Dysphagia, oropharyngeal phase: Secondary | ICD-10-CM | POA: Diagnosis not present

## 2022-07-03 DIAGNOSIS — R4701 Aphasia: Secondary | ICD-10-CM | POA: Diagnosis not present

## 2022-07-05 DIAGNOSIS — H900 Conductive hearing loss, bilateral: Secondary | ICD-10-CM | POA: Diagnosis not present

## 2022-07-05 DIAGNOSIS — R4701 Aphasia: Secondary | ICD-10-CM | POA: Diagnosis not present

## 2022-07-05 DIAGNOSIS — R1312 Dysphagia, oropharyngeal phase: Secondary | ICD-10-CM | POA: Diagnosis not present

## 2022-07-08 DIAGNOSIS — R4701 Aphasia: Secondary | ICD-10-CM | POA: Diagnosis not present

## 2022-07-08 DIAGNOSIS — H900 Conductive hearing loss, bilateral: Secondary | ICD-10-CM | POA: Diagnosis not present

## 2022-07-08 DIAGNOSIS — R1312 Dysphagia, oropharyngeal phase: Secondary | ICD-10-CM | POA: Diagnosis not present

## 2022-07-10 DIAGNOSIS — H900 Conductive hearing loss, bilateral: Secondary | ICD-10-CM | POA: Diagnosis not present

## 2022-07-10 DIAGNOSIS — R4701 Aphasia: Secondary | ICD-10-CM | POA: Diagnosis not present

## 2022-07-10 DIAGNOSIS — R1312 Dysphagia, oropharyngeal phase: Secondary | ICD-10-CM | POA: Diagnosis not present

## 2022-07-12 DIAGNOSIS — R1312 Dysphagia, oropharyngeal phase: Secondary | ICD-10-CM | POA: Diagnosis not present

## 2022-07-12 DIAGNOSIS — R4701 Aphasia: Secondary | ICD-10-CM | POA: Diagnosis not present

## 2022-07-12 DIAGNOSIS — H900 Conductive hearing loss, bilateral: Secondary | ICD-10-CM | POA: Diagnosis not present

## 2022-07-13 ENCOUNTER — Encounter (HOSPITAL_BASED_OUTPATIENT_CLINIC_OR_DEPARTMENT_OTHER): Payer: Self-pay | Admitting: *Deleted

## 2022-07-13 ENCOUNTER — Emergency Department (HOSPITAL_BASED_OUTPATIENT_CLINIC_OR_DEPARTMENT_OTHER): Payer: Medicare Other

## 2022-07-13 ENCOUNTER — Emergency Department (HOSPITAL_BASED_OUTPATIENT_CLINIC_OR_DEPARTMENT_OTHER): Payer: Medicare Other | Admitting: Radiology

## 2022-07-13 ENCOUNTER — Inpatient Hospital Stay (HOSPITAL_BASED_OUTPATIENT_CLINIC_OR_DEPARTMENT_OTHER)
Admission: EM | Admit: 2022-07-13 | Discharge: 2022-07-19 | DRG: 552 | Disposition: A | Discharge status: Skilled Nursing Facility | Payer: Medicare Other | Source: Skilled Nursing Facility | Attending: Internal Medicine | Admitting: Internal Medicine

## 2022-07-13 ENCOUNTER — Other Ambulatory Visit: Payer: Self-pay

## 2022-07-13 DIAGNOSIS — R9431 Abnormal electrocardiogram [ECG] [EKG]: Secondary | ICD-10-CM | POA: Diagnosis not present

## 2022-07-13 DIAGNOSIS — G8929 Other chronic pain: Secondary | ICD-10-CM | POA: Diagnosis not present

## 2022-07-13 DIAGNOSIS — S0083XA Contusion of other part of head, initial encounter: Secondary | ICD-10-CM | POA: Diagnosis not present

## 2022-07-13 DIAGNOSIS — I493 Ventricular premature depolarization: Secondary | ICD-10-CM | POA: Diagnosis not present

## 2022-07-13 DIAGNOSIS — Z515 Encounter for palliative care: Secondary | ICD-10-CM | POA: Diagnosis not present

## 2022-07-13 DIAGNOSIS — K219 Gastro-esophageal reflux disease without esophagitis: Secondary | ICD-10-CM | POA: Diagnosis present

## 2022-07-13 DIAGNOSIS — D509 Iron deficiency anemia, unspecified: Secondary | ICD-10-CM | POA: Diagnosis not present

## 2022-07-13 DIAGNOSIS — Z743 Need for continuous supervision: Secondary | ICD-10-CM | POA: Diagnosis not present

## 2022-07-13 DIAGNOSIS — S60221A Contusion of right hand, initial encounter: Secondary | ICD-10-CM | POA: Diagnosis not present

## 2022-07-13 DIAGNOSIS — S0990XA Unspecified injury of head, initial encounter: Secondary | ICD-10-CM | POA: Diagnosis not present

## 2022-07-13 DIAGNOSIS — R6889 Other general symptoms and signs: Secondary | ICD-10-CM | POA: Diagnosis not present

## 2022-07-13 DIAGNOSIS — Z823 Family history of stroke: Secondary | ICD-10-CM

## 2022-07-13 DIAGNOSIS — R262 Difficulty in walking, not elsewhere classified: Secondary | ICD-10-CM | POA: Diagnosis present

## 2022-07-13 DIAGNOSIS — N390 Urinary tract infection, site not specified: Secondary | ICD-10-CM | POA: Diagnosis present

## 2022-07-13 DIAGNOSIS — S32030A Wedge compression fracture of third lumbar vertebra, initial encounter for closed fracture: Secondary | ICD-10-CM | POA: Diagnosis not present

## 2022-07-13 DIAGNOSIS — N3000 Acute cystitis without hematuria: Secondary | ICD-10-CM | POA: Diagnosis present

## 2022-07-13 DIAGNOSIS — Z90710 Acquired absence of both cervix and uterus: Secondary | ICD-10-CM

## 2022-07-13 DIAGNOSIS — S22080A Wedge compression fracture of T11-T12 vertebra, initial encounter for closed fracture: Secondary | ICD-10-CM

## 2022-07-13 DIAGNOSIS — F0394 Unspecified dementia, unspecified severity, with anxiety: Secondary | ICD-10-CM | POA: Diagnosis not present

## 2022-07-13 DIAGNOSIS — R338 Other retention of urine: Secondary | ICD-10-CM | POA: Insufficient documentation

## 2022-07-13 DIAGNOSIS — S199XXA Unspecified injury of neck, initial encounter: Secondary | ICD-10-CM | POA: Diagnosis not present

## 2022-07-13 DIAGNOSIS — L89151 Pressure ulcer of sacral region, stage 1: Secondary | ICD-10-CM | POA: Diagnosis present

## 2022-07-13 DIAGNOSIS — Z66 Do not resuscitate: Secondary | ICD-10-CM | POA: Diagnosis not present

## 2022-07-13 DIAGNOSIS — I10 Essential (primary) hypertension: Secondary | ICD-10-CM | POA: Diagnosis not present

## 2022-07-13 DIAGNOSIS — Z8249 Family history of ischemic heart disease and other diseases of the circulatory system: Secondary | ICD-10-CM

## 2022-07-13 DIAGNOSIS — E7800 Pure hypercholesterolemia, unspecified: Secondary | ICD-10-CM | POA: Diagnosis not present

## 2022-07-13 DIAGNOSIS — M545 Low back pain, unspecified: Principal | ICD-10-CM | POA: Diagnosis present

## 2022-07-13 DIAGNOSIS — S32030S Wedge compression fracture of third lumbar vertebra, sequela: Secondary | ICD-10-CM | POA: Diagnosis not present

## 2022-07-13 DIAGNOSIS — F411 Generalized anxiety disorder: Secondary | ICD-10-CM | POA: Diagnosis present

## 2022-07-13 DIAGNOSIS — I160 Hypertensive urgency: Secondary | ICD-10-CM

## 2022-07-13 DIAGNOSIS — H9193 Unspecified hearing loss, bilateral: Secondary | ICD-10-CM | POA: Diagnosis present

## 2022-07-13 DIAGNOSIS — M4850XA Collapsed vertebra, not elsewhere classified, site unspecified, initial encounter for fracture: Secondary | ICD-10-CM

## 2022-07-13 DIAGNOSIS — S59911A Unspecified injury of right forearm, initial encounter: Secondary | ICD-10-CM | POA: Diagnosis not present

## 2022-07-13 DIAGNOSIS — S0093XA Contusion of unspecified part of head, initial encounter: Secondary | ICD-10-CM | POA: Diagnosis not present

## 2022-07-13 DIAGNOSIS — Z7401 Bed confinement status: Secondary | ICD-10-CM

## 2022-07-13 DIAGNOSIS — M542 Cervicalgia: Secondary | ICD-10-CM | POA: Diagnosis not present

## 2022-07-13 DIAGNOSIS — R1314 Dysphagia, pharyngoesophageal phase: Secondary | ICD-10-CM | POA: Diagnosis not present

## 2022-07-13 DIAGNOSIS — W19XXXA Unspecified fall, initial encounter: Secondary | ICD-10-CM | POA: Diagnosis not present

## 2022-07-13 DIAGNOSIS — W010XXA Fall on same level from slipping, tripping and stumbling without subsequent striking against object, initial encounter: Secondary | ICD-10-CM | POA: Diagnosis present

## 2022-07-13 DIAGNOSIS — B961 Klebsiella pneumoniae [K. pneumoniae] as the cause of diseases classified elsewhere: Secondary | ICD-10-CM | POA: Diagnosis present

## 2022-07-13 DIAGNOSIS — M4856XA Collapsed vertebra, not elsewhere classified, lumbar region, initial encounter for fracture: Secondary | ICD-10-CM | POA: Diagnosis not present

## 2022-07-13 DIAGNOSIS — D696 Thrombocytopenia, unspecified: Secondary | ICD-10-CM | POA: Diagnosis not present

## 2022-07-13 DIAGNOSIS — S6991XA Unspecified injury of right wrist, hand and finger(s), initial encounter: Secondary | ICD-10-CM | POA: Diagnosis not present

## 2022-07-13 DIAGNOSIS — Z79899 Other long term (current) drug therapy: Secondary | ICD-10-CM | POA: Diagnosis not present

## 2022-07-13 DIAGNOSIS — F0390 Unspecified dementia without behavioral disturbance: Secondary | ICD-10-CM | POA: Insufficient documentation

## 2022-07-13 DIAGNOSIS — D649 Anemia, unspecified: Secondary | ICD-10-CM | POA: Diagnosis not present

## 2022-07-13 DIAGNOSIS — R319 Hematuria, unspecified: Secondary | ICD-10-CM | POA: Diagnosis not present

## 2022-07-13 DIAGNOSIS — R404 Transient alteration of awareness: Secondary | ICD-10-CM | POA: Diagnosis not present

## 2022-07-13 DIAGNOSIS — L8990 Pressure ulcer of unspecified site, unspecified stage: Secondary | ICD-10-CM | POA: Diagnosis not present

## 2022-07-13 DIAGNOSIS — Z043 Encounter for examination and observation following other accident: Secondary | ICD-10-CM | POA: Diagnosis not present

## 2022-07-13 DIAGNOSIS — S22080S Wedge compression fracture of T11-T12 vertebra, sequela: Secondary | ICD-10-CM | POA: Diagnosis not present

## 2022-07-13 DIAGNOSIS — Z9049 Acquired absence of other specified parts of digestive tract: Secondary | ICD-10-CM

## 2022-07-13 DIAGNOSIS — R040 Epistaxis: Secondary | ICD-10-CM | POA: Diagnosis not present

## 2022-07-13 DIAGNOSIS — M549 Dorsalgia, unspecified: Secondary | ICD-10-CM | POA: Insufficient documentation

## 2022-07-13 DIAGNOSIS — M1991 Primary osteoarthritis, unspecified site: Secondary | ICD-10-CM | POA: Diagnosis not present

## 2022-07-13 DIAGNOSIS — R296 Repeated falls: Secondary | ICD-10-CM | POA: Diagnosis not present

## 2022-07-13 DIAGNOSIS — S22000A Wedge compression fracture of unspecified thoracic vertebra, initial encounter for closed fracture: Secondary | ICD-10-CM | POA: Diagnosis not present

## 2022-07-13 MED ORDER — FENTANYL CITRATE PF 50 MCG/ML IJ SOSY
50.0000 ug | PREFILLED_SYRINGE | Freq: Once | INTRAMUSCULAR | Status: AC
  Administered 2022-07-13: 50 ug via INTRAVENOUS
  Filled 2022-07-13: qty 1

## 2022-07-13 NOTE — ED Provider Notes (Signed)
[place] EMERGENCY DEPARTMENT AT [facility] Provider Note   CSN: [id number] Arrival date & time: 07/13/22  2225     History {Add pertinent medical, surgical, social history, OB history to HPI:1} Chief Complaint  Patient presents with   Marletta Lor    [name] is a 89 y.o. female.  HPI     89 year old female comes in with chief complaint of back pain, fall.  Patient has history of dementia, lives at assisted living.  Daughters are at the bedside and provides substantial part of the history.  According to the daughter, the nursing home informed him that patient had a mechanical fall around 6 PM.  Patient was trying to put her shoes on and tripped, fell onto her walker.  She is complaining of pain to her back.  Patient also has evidence of trauma to her head.  They went and checked on the patient around 8 PM, she was doing well but an hour later the nursing home called again indicating that patient was complaining of severe pain.  Patient normally walks with a walker.  She is not on any blood thinners.  Patient complains of back pain.  Home Medications Prior to Admission medications   Medication Sig Start Date End Date Taking? Authorizing Provider  sertraline (ZOLOFT) 25 MG tablet Take 25 mg by mouth daily. 06/19/22  Yes [provider]  acetaminophen (TYLENOL) 500 MG tablet Take 500 mg by mouth daily at 12 noon. As needed    [provider]  diltiazem (CARDIZEM CD) 180 MG 24 hr capsule Take 1 capsule (180 mg total) by mouth daily. 03/30/20   Ngetich, Dinah C, NP  metoprolol succinate (TOPROL-XL) 25 MG 24 hr tablet Take 0.5 tablets (12.5 mg total) by mouth daily. 08/28/20   Fargo, Amy E, NP  tobramycin-dexamethasone (TOBRADEX) ophthalmic solution Place 1 drop into both eyes every 4 (four) hours while awake. 07/20/21   Darrick Grinder, PA-C  traZODone (DESYREL) 50 MG tablet Take 50 mg by mouth as needed.    [provider]  diltiazem (DILACOR XR) 180  MG 24 hr capsule Take 1 capsule (180 mg total) by mouth daily. 12/04/10 12/30/11  Michele Mcalpine, MD      Allergies    Patient has no known allergies.    Review of Systems   Review of Systems  All other systems reviewed and are negative.   Physical Exam Updated Vital Signs There were no vitals taken for this visit. Physical Exam Vitals and nursing note reviewed.  Constitutional:      Appearance: She is well-developed.  HENT:     Head:     Comments: Patient has ecchymosis to her left forehead Eyes:     Extraocular Movements: Extraocular movements intact.  Cardiovascular:     Rate and Rhythm: Normal rate.  Pulmonary:     Effort: Pulmonary effort is normal.  Musculoskeletal:     Cervical back: Neck supple. No tenderness.     Comments: Patient has tenderness over the lower thoracic and upper lumbar spine region. She has tenderness with active raising of her left hip. No gross deformity of upper or lower extremity noted.  Skin:    General: Skin is dry.  Neurological:     Mental Status: She is alert and oriented to person, place, and time.     ED Results / Procedures / Treatments   Labs (all labs ordered are listed, but only abnormal results are displayed) Labs Reviewed - No data  to display  EKG None  Radiology No results found.  Procedures Procedures  {Document cardiac monitor, telemetry assessment procedure when appropriate:1}  Medications Ordered in ED Medications - No data to display  ED Course/ Medical Decision Making/ A&P   {   Click here for ABCD2, HEART and other calculatorsREFRESH Note before signing :1}                          Medical Decision Making Amount and/or Complexity of Data Reviewed Radiology: ordered.   89 year old female comes in with chief complaint of mechanical fall.  Substantial part of the history was provided by patient's daughters, who are at the bedside.  Patient has history of dementia, history is limited from her side, she  has no complaints besides back pain.  On exam, patient has bruising to her head.  There is also some bruising to the right hand.  She has no gross deformity.  Palpation of the hand reveals no evidence of discomfort.  She does have discomfort over her spine primarily.  We will get x-ray of the left hip, thoracic and lumbar spine, CT head and C-spine.  This was a mechanical fall, the day was normal until the fall.  No need for any blood work.  Final Clinical Impression(s) / ED Diagnoses Final diagnoses:  None    Rx / DC Orders ED Discharge Orders     None

## 2022-07-13 NOTE — ED Triage Notes (Addendum)
Pt resides at [facility]. Pt had a fall around 6pm. Was trying to put her shoes on and she fell into her walker Loc unknown. Pt is not on blood thinners. Pt c/o pain to her upper and mid back. Pt has a hematoma to the left anterior aspect of her forehead. Loc unknown. Pt does have a hx of dementia. Pt normally walks with a walker. Pt cries out in pain with movement . Otherwise she does not c/o of pain.

## 2022-07-14 DIAGNOSIS — S59911A Unspecified injury of right forearm, initial encounter: Secondary | ICD-10-CM | POA: Diagnosis not present

## 2022-07-14 DIAGNOSIS — S22000A Wedge compression fracture of unspecified thoracic vertebra, initial encounter for closed fracture: Secondary | ICD-10-CM

## 2022-07-14 DIAGNOSIS — F0394 Unspecified dementia, unspecified severity, with anxiety: Secondary | ICD-10-CM | POA: Diagnosis present

## 2022-07-14 DIAGNOSIS — S0093XA Contusion of unspecified part of head, initial encounter: Secondary | ICD-10-CM | POA: Diagnosis present

## 2022-07-14 DIAGNOSIS — Z7401 Bed confinement status: Secondary | ICD-10-CM | POA: Diagnosis not present

## 2022-07-14 DIAGNOSIS — D649 Anemia, unspecified: Secondary | ICD-10-CM

## 2022-07-14 DIAGNOSIS — S60221A Contusion of right hand, initial encounter: Secondary | ICD-10-CM | POA: Diagnosis present

## 2022-07-14 DIAGNOSIS — S0083XA Contusion of other part of head, initial encounter: Secondary | ICD-10-CM | POA: Diagnosis present

## 2022-07-14 DIAGNOSIS — N390 Urinary tract infection, site not specified: Secondary | ICD-10-CM | POA: Diagnosis not present

## 2022-07-14 DIAGNOSIS — D696 Thrombocytopenia, unspecified: Secondary | ICD-10-CM

## 2022-07-14 DIAGNOSIS — R338 Other retention of urine: Secondary | ICD-10-CM | POA: Insufficient documentation

## 2022-07-14 DIAGNOSIS — Z66 Do not resuscitate: Secondary | ICD-10-CM | POA: Diagnosis present

## 2022-07-14 DIAGNOSIS — S199XXA Unspecified injury of neck, initial encounter: Secondary | ICD-10-CM | POA: Diagnosis not present

## 2022-07-14 DIAGNOSIS — I493 Ventricular premature depolarization: Secondary | ICD-10-CM | POA: Diagnosis present

## 2022-07-14 DIAGNOSIS — B961 Klebsiella pneumoniae [K. pneumoniae] as the cause of diseases classified elsewhere: Secondary | ICD-10-CM | POA: Diagnosis present

## 2022-07-14 DIAGNOSIS — Z79899 Other long term (current) drug therapy: Secondary | ICD-10-CM | POA: Diagnosis not present

## 2022-07-14 DIAGNOSIS — S32030A Wedge compression fracture of third lumbar vertebra, initial encounter for closed fracture: Secondary | ICD-10-CM | POA: Insufficient documentation

## 2022-07-14 DIAGNOSIS — L8990 Pressure ulcer of unspecified site, unspecified stage: Secondary | ICD-10-CM | POA: Insufficient documentation

## 2022-07-14 DIAGNOSIS — K219 Gastro-esophageal reflux disease without esophagitis: Secondary | ICD-10-CM | POA: Diagnosis present

## 2022-07-14 DIAGNOSIS — I160 Hypertensive urgency: Secondary | ICD-10-CM | POA: Diagnosis present

## 2022-07-14 DIAGNOSIS — R9431 Abnormal electrocardiogram [ECG] [EKG]: Secondary | ICD-10-CM | POA: Diagnosis present

## 2022-07-14 DIAGNOSIS — Z515 Encounter for palliative care: Secondary | ICD-10-CM | POA: Diagnosis not present

## 2022-07-14 DIAGNOSIS — M545 Low back pain, unspecified: Secondary | ICD-10-CM | POA: Diagnosis present

## 2022-07-14 DIAGNOSIS — E7800 Pure hypercholesterolemia, unspecified: Secondary | ICD-10-CM | POA: Diagnosis present

## 2022-07-14 DIAGNOSIS — S6991XA Unspecified injury of right wrist, hand and finger(s), initial encounter: Secondary | ICD-10-CM | POA: Diagnosis not present

## 2022-07-14 DIAGNOSIS — M549 Dorsalgia, unspecified: Secondary | ICD-10-CM | POA: Insufficient documentation

## 2022-07-14 DIAGNOSIS — N3000 Acute cystitis without hematuria: Secondary | ICD-10-CM | POA: Diagnosis present

## 2022-07-14 DIAGNOSIS — G8929 Other chronic pain: Secondary | ICD-10-CM | POA: Diagnosis present

## 2022-07-14 DIAGNOSIS — M4850XA Collapsed vertebra, not elsewhere classified, site unspecified, initial encounter for fracture: Secondary | ICD-10-CM

## 2022-07-14 DIAGNOSIS — R319 Hematuria, unspecified: Secondary | ICD-10-CM | POA: Diagnosis not present

## 2022-07-14 DIAGNOSIS — Z043 Encounter for examination and observation following other accident: Secondary | ICD-10-CM | POA: Diagnosis not present

## 2022-07-14 DIAGNOSIS — S0990XA Unspecified injury of head, initial encounter: Secondary | ICD-10-CM | POA: Diagnosis not present

## 2022-07-14 DIAGNOSIS — D509 Iron deficiency anemia, unspecified: Secondary | ICD-10-CM | POA: Diagnosis not present

## 2022-07-14 DIAGNOSIS — M4856XA Collapsed vertebra, not elsewhere classified, lumbar region, initial encounter for fracture: Secondary | ICD-10-CM | POA: Diagnosis present

## 2022-07-14 DIAGNOSIS — W19XXXA Unspecified fall, initial encounter: Secondary | ICD-10-CM | POA: Insufficient documentation

## 2022-07-14 DIAGNOSIS — S22080A Wedge compression fracture of T11-T12 vertebra, initial encounter for closed fracture: Secondary | ICD-10-CM | POA: Diagnosis not present

## 2022-07-14 DIAGNOSIS — W010XXA Fall on same level from slipping, tripping and stumbling without subsequent striking against object, initial encounter: Secondary | ICD-10-CM | POA: Diagnosis present

## 2022-07-14 DIAGNOSIS — I10 Essential (primary) hypertension: Secondary | ICD-10-CM | POA: Diagnosis present

## 2022-07-14 DIAGNOSIS — L89151 Pressure ulcer of sacral region, stage 1: Secondary | ICD-10-CM | POA: Diagnosis present

## 2022-07-14 DIAGNOSIS — H9193 Unspecified hearing loss, bilateral: Secondary | ICD-10-CM | POA: Diagnosis present

## 2022-07-14 DIAGNOSIS — F411 Generalized anxiety disorder: Secondary | ICD-10-CM | POA: Diagnosis present

## 2022-07-14 LAB — CBC
HCT: 35.3 % — ABNORMAL LOW (ref 36.0–46.0)
HCT: 43.2 % (ref 36.0–46.0)
Hemoglobin: 12.1 g/dL (ref 12.0–15.0)
Hemoglobin: 13.7 g/dL (ref 12.0–15.0)
MCH: 32.9 pg (ref 26.0–34.0)
MCH: 32.2 pg (ref 26.0–34.0)
MCHC: 34.3 g/dL (ref 30.0–36.0)
MCHC: 31.7 g/dL (ref 30.0–36.0)
MCV: 95.9 fL (ref 80.0–100.0)
MCV: 101.4 fL — ABNORMAL HIGH (ref 80.0–100.0)
Platelets: 216 10*3/uL (ref 150–400)
Platelets: 181 10*3/uL (ref 150–400)
RBC: 3.68 MIL/uL — ABNORMAL LOW (ref 3.87–5.11)
RBC: 4.26 MIL/uL (ref 3.87–5.11)
RDW: 11.9 % (ref 11.5–15.5)
RDW: 12.6 % (ref 11.5–15.5)
WBC: 12.8 10*3/uL — ABNORMAL HIGH (ref 4.0–10.5)
WBC: 15.1 10*3/uL — ABNORMAL HIGH (ref 4.0–10.5)
nRBC: 0 % (ref 0.0–0.2)
nRBC: 0 % (ref 0.0–0.2)

## 2022-07-14 LAB — CBC WITH DIFFERENTIAL/PLATELET
Abs Immature Granulocytes: 0.02 10*3/uL (ref 0.00–0.07)
Basophils Absolute: 0 10*3/uL (ref 0.0–0.1)
Basophils Relative: 0 %
Eosinophils Absolute: 0 10*3/uL (ref 0.0–0.5)
Eosinophils Relative: 0 %
HCT: 19.9 % — ABNORMAL LOW (ref 36.0–46.0)
Hemoglobin: 6.5 g/dL — CL (ref 12.0–15.0)
Immature Granulocytes: 0 %
Lymphocytes Relative: 11 %
Lymphs Abs: 0.7 10*3/uL (ref 0.7–4.0)
MCH: 32.3 pg (ref 26.0–34.0)
MCHC: 32.7 g/dL (ref 30.0–36.0)
MCV: 99 fL (ref 80.0–100.0)
Monocytes Absolute: 0.4 10*3/uL (ref 0.1–1.0)
Monocytes Relative: 6 %
Neutro Abs: 5.3 10*3/uL (ref 1.7–7.7)
Neutrophils Relative %: 83 %
Platelets: 112 10*3/uL — ABNORMAL LOW (ref 150–400)
RBC: 2.01 MIL/uL — ABNORMAL LOW (ref 3.87–5.11)
RDW: 12.3 % (ref 11.5–15.5)
WBC: 6.5 10*3/uL (ref 4.0–10.5)
nRBC: 0 % (ref 0.0–0.2)

## 2022-07-14 LAB — URINALYSIS, ROUTINE W REFLEX MICROSCOPIC
Bilirubin Urine: NEGATIVE
Glucose, UA: NEGATIVE mg/dL
Ketones, ur: NEGATIVE mg/dL
Nitrite: POSITIVE — AB
Protein, ur: NEGATIVE mg/dL
Specific Gravity, Urine: 1.01 (ref 1.005–1.030)
pH: 7 (ref 5.0–8.0)

## 2022-07-14 LAB — MRSA NEXT GEN BY PCR, NASAL: MRSA by PCR Next Gen: NOT DETECTED

## 2022-07-14 LAB — FERRITIN: Ferritin: 72 ng/mL (ref 11–307)

## 2022-07-14 LAB — BASIC METABOLIC PANEL
Anion gap: 11 (ref 5–15)
Anion gap: 9 (ref 5–15)
BUN: 17 mg/dL (ref 8–23)
BUN: 14 mg/dL (ref 8–23)
CO2: 25 mmol/L (ref 22–32)
CO2: 25 mmol/L (ref 22–32)
Calcium: 9.2 mg/dL (ref 8.9–10.3)
Calcium: 8.9 mg/dL (ref 8.9–10.3)
Chloride: 102 mmol/L (ref 98–111)
Chloride: 100 mmol/L (ref 98–111)
Creatinine, Ser: 0.56 mg/dL (ref 0.44–1.00)
Creatinine, Ser: 0.53 mg/dL (ref 0.44–1.00)
GFR, Estimated: >60 mL/min (ref >60)
GFR, Estimated: >60 mL/min (ref >60)
Glucose, Bld: 134 mg/dL — ABNORMAL HIGH (ref 70–99)
Glucose, Bld: 126 mg/dL — ABNORMAL HIGH (ref 70–99)
Potassium: 3.5 mmol/L (ref 3.5–5.1)
Potassium: 3 mmol/L — ABNORMAL LOW (ref 3.5–5.1)
Sodium: 138 mmol/L (ref 135–145)
Sodium: 134 mmol/L — ABNORMAL LOW (ref 135–145)

## 2022-07-14 LAB — MAGNESIUM: Magnesium: 1.9 mg/dL (ref 1.7–2.4)

## 2022-07-14 LAB — RETICULOCYTES
Immature Retic Fract: 7.7 % (ref 2.3–15.9)
RBC.: 3.63 MIL/uL — ABNORMAL LOW (ref 3.87–5.11)
Retic Count, Absolute: 46.8 10*3/uL (ref 19.0–186.0)
Retic Ct Pct: 1.3 % (ref 0.4–3.1)

## 2022-07-14 LAB — VITAMIN B12: Vitamin B-12: 103 pg/mL — ABNORMAL LOW (ref 180–914)

## 2022-07-14 LAB — IRON AND TIBC
Iron: 84 ug/dL (ref 28–170)
Saturation Ratios: 28 % (ref 10.4–31.8)
TIBC: 305 ug/dL (ref 250–450)
UIBC: 221 ug/dL

## 2022-07-14 LAB — BPAM RBC: Unit Type and Rh: 6200

## 2022-07-14 LAB — TYPE AND SCREEN

## 2022-07-14 LAB — FOLATE: Folate: 9 ng/mL (ref >5.9)

## 2022-07-14 LAB — PREPARE RBC (CROSSMATCH)

## 2022-07-14 MED ORDER — SODIUM CHLORIDE 0.9% IV SOLUTION: Freq: Once | INTRAVENOUS | Status: AC

## 2022-07-14 MED ORDER — MORPHINE SULFATE (PF) 4 MG/ML IV SOLN
4.0000 mg | Freq: Once | INTRAVENOUS | Status: AC
  Administered 2022-07-14: 4 mg via INTRAVENOUS
  Filled 2022-07-14: qty 1

## 2022-07-14 MED ORDER — HYDRALAZINE HCL 20 MG/ML IJ SOLN: 5.0000 mg | Freq: Four times a day (QID) | INTRAMUSCULAR | Status: DC | PRN

## 2022-07-14 MED ORDER — NALOXONE HCL 0.4 MG/ML IJ SOLN: 0.4000 mg | INTRAMUSCULAR | Status: DC | PRN

## 2022-07-14 MED ORDER — DILTIAZEM HCL ER COATED BEADS 180 MG PO CP24
180.0000 mg | ORAL_CAPSULE | Freq: Every day | ORAL | Status: DC
  Filled 2022-07-14: qty 1

## 2022-07-14 MED ORDER — MORPHINE SULFATE (PF) 2 MG/ML IV SOLN: 1.0000 mg | INTRAVENOUS | Status: DC | PRN

## 2022-07-14 MED ORDER — ACETAMINOPHEN 650 MG RE SUPP: 650.0000 mg | Freq: Four times a day (QID) | RECTAL | Status: DC | PRN

## 2022-07-14 MED ORDER — ACETAMINOPHEN 325 MG PO TABS
650.0000 mg | ORAL_TABLET | Freq: Four times a day (QID) | ORAL | Status: DC | PRN
  Administered 2022-07-15 – 2022-07-16 (×5): 650 mg via ORAL
  Filled 2022-07-14 (×6): qty 2

## 2022-07-14 MED ORDER — SODIUM CHLORIDE 0.9 % IV SOLN
1.0000 g | INTRAVENOUS | Status: DC
  Administered 2022-07-15: 1 g via INTRAVENOUS
  Filled 2022-07-14: qty 10

## 2022-07-14 MED ORDER — SERTRALINE HCL 50 MG PO TABS
75.0000 mg | ORAL_TABLET | Freq: Every day | ORAL | Status: DC
  Administered 2022-07-15 – 2022-07-19 (×5): 75 mg via ORAL
  Filled 2022-07-14 (×5): qty 1

## 2022-07-14 MED ORDER — METOPROLOL SUCCINATE ER 25 MG PO TB24
12.5000 mg | ORAL_TABLET | Freq: Every day | ORAL | Status: DC
  Administered 2022-07-15 – 2022-07-19 (×5): 12.5 mg via ORAL
  Filled 2022-07-14 (×5): qty 1

## 2022-07-14 MED ORDER — SODIUM CHLORIDE 0.9 % IV SOLN
1.0000 g | Freq: Once | INTRAVENOUS | Status: AC
  Administered 2022-07-14: 1 g via INTRAVENOUS
  Filled 2022-07-14: qty 10

## 2022-07-14 MED ORDER — FENTANYL CITRATE PF 50 MCG/ML IJ SOSY
50.0000 ug | PREFILLED_SYRINGE | Freq: Once | INTRAMUSCULAR | Status: AC
  Administered 2022-07-14: 50 ug via INTRAVENOUS
  Filled 2022-07-14: qty 1

## 2022-07-14 MED ORDER — SERTRALINE HCL 25 MG PO TABS: 25.0000 mg | ORAL_TABLET | Freq: Every day | ORAL | Status: DC

## 2022-07-14 NOTE — ED Notes (Signed)
[facility] called regarding ready bed at [facility].

## 2022-07-14 NOTE — Progress Notes (Signed)
  Progress Note   Patient: [name] NWG:[id number] DOB: April 16, 1923 DOA: 07/13/2022     0 DOS: the patient was seen and examined on 07/14/2022   Brief hospital course: 89 year old woman presented from memory care unit at [facility] after mechanical fall.  Imaging revealed compression deformities T12 and L3, was admitted for inability to bear weight and pain control.  Assessment and Plan: T12 and L3 compression fractures, indeterminate age Mechanical fall Back pain Plain films showed compression deformities in the superior endplates at T12 and L3, indeterminate in age.   Continue pain management, PT/OT, TOC consulted.   Abnormal urinalysis, possible UTI Acute urinary retention Urine culture pending.  No fever, leukocytosis, or signs of sepsis.  Continue ceftriaxone.    In-N-Out cath as needed  Spurious anemia Hemoglobin on admission was recorded at 6.5 with a platelet count of 112.  Repeat hemoglobin this morning without any blood products was 12.1 with a platelet count of 216.   Repeat CBC now stat.  Hold transfusion.   Hypertensive urgency Improved today.  Resume diltiazem and metoprolol.   Spurious thrombocytopenia   QT prolongation, resolved Repeat EKG 5/26 normal sinus rhythm, normal QTc.  Independent interpretation.   Dementia Delirium precautions.  Continue sertraline.   BMP Hgb 6.5 >  U/A+ CT head/neck nonacute Compression deformities T12, L3     Subjective:  Rough night per nursing Had urinary retention requiring I/O cath Now sleeping  Physical Exam: Vitals:   07/14/22 0638 07/14/22 0930 07/14/22 0945 07/14/22 0952  BP: (!) 175/77 (!) 170/80 (!) 150/69 (!) 150/69  Pulse: 70 74 71 71  Resp: 19 14 14 14   Temp: 97.6 F (36.4 C) (!) 97.5 F (36.4 C) 98 F (36.7 C) 98 F (36.7 C)  TempSrc: Oral Axillary Axillary   SpO2: 100% 98% 99%   Weight:      Height:       Physical Exam Vitals reviewed.  Constitutional:      General: She is not in acute  distress.    Appearance: She is not ill-appearing or toxic-appearing.  Cardiovascular:     Rate and Rhythm: Normal rate and regular rhythm.     Heart sounds: No murmur heard. Pulmonary:     Effort: Pulmonary effort is normal. No respiratory distress.     Breath sounds: No wheezing, rhonchi or rales.     Data Reviewed: BMP Hgb 6.5 >  U/A+ CT head/neck nonacute Compression deformities T12, L3  Family Communication: none present  Disposition: Status is: Observation   Planned Discharge Destination:  TBD    Time spent: 35 minutes  Author: Brendia Sacks, MD 07/14/2022 10:42 AM  For on call review [email or web address].

## 2022-07-14 NOTE — ED Provider Notes (Signed)
Care of the patient assumed at shift change. Patient here from local [facility] for evaluation after a fall earlier in the afternoon. She apparently began complaining of back pain several hours later, so EMS was called to bring her in. Pending imaging at shift change.  Physical Exam  BP (!) 188/83   Pulse 75   Resp 20   SpO2 98%   Physical Exam  Procedures  Procedures  ED Course / MDM   Clinical Course as of 07/14/22 0255  Sun Jul 14, 2022  0105 I personally viewed the images from radiology studies and agree with radiologist interpretation: CT head and Cspine are neg for acute injury. Xrays of wrist, forearm and hip are neg. Spine xrays concerning for compression fx of T12 and L3 corresponds to her areas of pain. She was resting comfortably after a dose of fentanyl but is now agitated because she needs to urinate but does not want to use an adult diaper. Will see if she can stand to bedside commode. If not may require admission for pain control.  [CS]  0116 Patient unable to stand or pivot to bedside commode with max assist. Will add basic blood work and plan admission for pain control and for TLSO fitting, PT, etc. [CS]  0147 UA is concerning for UTI. Will begin Rocephin.  [CS]  0223 BMP is unremarkable. CBC with anemia, Hgb is <7, daughters report history of anemia, but has not needed transfusion. Not on a blood thinner. No reported bleeding recently, although she does occasionally have nosebleeds.  [CS]  [phone] Spoke with Dr. Imogene Burn, Hospitalist, who will accept for admission.  [CS]    Clinical Course User Index [CS] Pollyann Savoy, MD   Medical Decision Making Problems Addressed: Acute cystitis without hematuria: acute illness or injury Anemia, unspecified type: undiagnosed new problem with uncertain prognosis Compression fracture of L3 lumbar vertebra, closed, initial encounter [facility]): acute illness or injury Compression fracture of T12 vertebra, initial encounter [facility]): acute illness or  injury Fall, initial encounter: acute illness or injury  Amount and/or Complexity of Data Reviewed Labs: ordered. Decision-making details documented in ED Course. Radiology: ordered and independent interpretation performed. Decision-making details documented in ED Course.  Risk Prescription drug management. Parenteral controlled substances. Decision regarding hospitalization.          Pollyann Savoy, MD 07/14/22 [phone]

## 2022-07-14 NOTE — Progress Notes (Signed)
   Patient Name: [name], avetisyan DOB: 03/29/1923 MRN: [id number] Transferring facility: [facility] Requesting provider: sheldon, md Reason for transfer: anemia, UTI, compression fracture 89 yo WF with dementia, recent fall. c/o of back pain. T12, L3 compression fracture. HgB 6.5. UA shows +nitrites, +LE, +bacteria Going to: [facility] Admission Status: observation Bed Type: med/surg To Do:  [facility] will assume care on arrival to accepting facility. Until arrival, medical decision making responsibilities remain with the EDP.  However, [facility] available 24/7 for questions and assistance.   Nursing staff please page [facility] Admits and Consults [phone]) as soon as the patient arrives to the hospital.  Carollee Herter, DO [facility]

## 2022-07-14 NOTE — Progress Notes (Signed)
MEWS Progress Note  Patient Details Name: KRITHIKA ROMANIELLO MRN: [id number] DOB: Jun 30, 1923 Today's Date: 07/14/2022   MEWS Flowsheet Documentation:  Assess: MEWS Score Temp: 97.6 F (36.4 C) BP: (!) 175/77 MAP (mmHg): 105 Pulse Rate: 70 ECG Heart Rate: 80 Resp: 19 Level of Consciousness: Alert SpO2: 100 % O2 Device: Nasal Cannula O2 Flow Rate (L/min): 2 L/min Assess: MEWS Score MEWS Temp: 0 MEWS Systolic: 0 MEWS Pulse: 0 MEWS RR: 0 MEWS LOC: 0 MEWS Score: 0 MEWS Score Color: Green Assess: SIRS CRITERIA SIRS Temperature : 0 SIRS Respirations : 0 SIRS Pulse: 0 SIRS WBC: 0 SIRS Score Sum : 0 Assess: if the MEWS score is Yellow or Red Were vital signs taken at a resting state?: Yes Focused Assessment: No change from prior assessment Does the patient meet 2 or more of the SIRS criteria?: No MEWS guidelines implemented : Yes, yellow Treat MEWS Interventions: Considered administering scheduled or prn medications/treatments as ordered Take Vital Signs Increase Vital Sign Frequency : Yellow: Q2hr x1, continue Q4hrs until patient remains green for 12hrs Escalate MEWS: Escalate: Yellow: Discuss with charge nurse and consider notifying provider and/or RRT Notify: Charge Nurse/RN Name of Charge Nurse/RN Notified: Salli Quarry, RN Provider Notification Provider Name/Title: Dr. Loney Loh Date Provider Notified: 07/14/22 Time Provider Notified: [phone] Method of Notification: Face-to-face (Dr. Loney Loh in to see patient and do admission) Notification Reason: Other (Comment) (mews protocol) Provider response: At bedside Date of Provider Response: 07/14/22 Time of Provider Response: 0450 Notify: Rapid Response Name of Rapid Response RN Notified: not necessary at this time      Kizzie Bane 07/14/2022, 6:39 AM

## 2022-07-14 NOTE — Plan of Care (Signed)
  Problem: Education: Goal: Knowledge of General Education information will improve Description: Including pain rating scale, medication(s)/side effects and non-pharmacologic comfort measures Outcome: Progressing   Problem: Activity: Goal: Risk for activity intolerance will decrease Outcome: Progressing   Problem: Pain Managment: Goal: General experience of comfort will improve Outcome: Progressing   

## 2022-07-14 NOTE — Progress Notes (Signed)
PT Cancellation Note  Patient Details Name: MICHELA HILFIKER MRN: [id number] DOB: 1923-02-28   Cancelled Treatment:     PT order received but deferred this am - pt Hgb 6.5 with transfusion ordered.  Will follow   Chad Donoghue 07/14/2022, 7:27 AM

## 2022-07-14 NOTE — ED Notes (Signed)
Dr. Bernette Mayers aware of hgb 6.5

## 2022-07-14 NOTE — ED Notes (Signed)
Pt. Placed on 2L/min O2 via N/C after sats dropped to low 90's post fentanyl administration. Daughters remain at bedside

## 2022-07-14 NOTE — Progress Notes (Signed)
OT Cancellation Note  Patient Details Name: [name] MRN: [id number] DOB: Jul 05, 1923   Cancelled Treatment:    Reason Eval/Treat Not Completed: Medical issues which prohibited therapy Patient with hgb of 6.5 pending transfusion and nurse reporting patient just fell asleep to hold off on therapy at this time. OT to continue to follow and check back as schedule will allow.  Rosalio Loud, MS Acute Rehabilitation Department Office# [phone]  Selinda Flavin 07/14/2022, 7:56 AM

## 2022-07-14 NOTE — H&P (Addendum)
History and Physical    SEVILLE WANTZ ZOX:[id number] DOB: [birth year] DOA: 07/13/2022  PCP: Pcp, No  Patient coming from: [facility] ED  Chief Complaint: Fall  HPI: [name] is a 89 y.o. female with medical history significant of dementia, hyperlipidemia, hypertension, anxiety, chronic constipation, GERD, osteoporosis, bilateral hearing loss, tremor, chronic venous insufficiency, vitamin B12 deficiency presented to the ED from ALF after she had a mechanical fall around 6 PM.  Patient was trying to put her shoes on and tripped, fell onto her walker.  She complained of severe back pain and had evidence of left forehead hematoma.  Not hypoxic on arrival to ED but sats dropped to low 90s after she was given fentanyl for pain and was subsequently placed on 2 L supplemental oxygen.  She was hypertensive in the ED.  Afebrile.  Labs showing WBC 6.5, hemoglobin 6.5, MCV 99.0, platelet count 112k, sodium 138, potassium 3.5, chloride 102, bicarb 25, BUN 17, creatinine 0.5, glucose 134.  UA with positive nitrite, large amount of leukocytes, and microscopy showing 21-50 RBCs, 21-50 WBCs, and many bacteria.  Urine culture pending.  CT head/C-spine negative for acute findings.  X-rays of thoracic and lumbar spine showing compression deformities in the superior endplates at T12 and L3, indeterminate in age.  X-ray negative for acute fracture or dislocation of the hips bilaterally.  X-rays of right forearm and wrist negative for fracture or dislocation.  Patient required 2 person assistance in the ED and was not able to bear weight due to her back pain.  Patient received fentanyl, morphine, and ceftriaxone in the ED.  Patient has dementia and very hard of hearing, she is not able to give any information.  History provided by her 2 daughters at bedside who informed me that patient is currently at the memory care unit at [facility]  Staff from her facility had called patient's daughter to inform her that she  had a mechanical fall at 6 PM and fell onto her walker.  She did have bruising to her forehead but no loss of consciousness reported.  Reportedly staff were able to help the patient get up and she walked back to her room using her walker but continued to complain of severe mid/low back pain.  Family states patient has otherwise been in her usual state of health and no recent illness reported by her facility.  She is eating food and has not lost any weight.  No nausea or vomiting.  They are not aware of patient having any GI bleed symptoms, however, daughters do mention that patient has a habit of constantly picking her nose to the point where she starts having nosebleeds.  Review of Systems:  Review of Systems  All other systems reviewed and are negative.   Past Medical History:  Diagnosis Date   Abnormal involuntary movements(781.0)    Anemia    Anxiety state, unspecified    Bilateral swelling of feet    Blood transfusion    Cardiac dysrhythmia, unspecified    Chronic constipation    Dementia (HCC)    DJD (degenerative joint disease)    Esophageal stricture    Generalized osteoarthrosis, unspecified site    GERD (gastroesophageal reflux disease)    Hearing loss    Hiatal hernia    Hypertension    Macular hole of left eye    Osteoporosis    Pelvic adhesions    Postgastrectomy syndrome    Pure hypercholesterolemia    Tortuous colon  Tremor    Urinary tract infection, site not specified    Venous insufficiency    Vitamin B12 deficiency     Past Surgical History:  Procedure Laterality Date   CHOLECYSTECTOMY     COLONOSCOPY     ESOPHAGUS SURGERY  2003   mole biopsy- left neck area Left    NISSEN FUNDOPLICATION     TOTAL ABDOMINAL HYSTERECTOMY     DUB,    UPPER GASTROINTESTINAL ENDOSCOPY       reports that she has never smoked. She has never used smokeless tobacco. She reports that she does not drink alcohol and does not use drugs.  No Known Allergies  Family History   Problem Relation Age of Onset   Cancer Mother 9       unknown type, most likely colon   Heart disease Brother 36   Stroke Sister 43   High blood pressure Son 39   Graves' disease Daughter    High blood pressure Daughter 69   High blood pressure Daughter 64   Irritable bowel syndrome Daughter    Heart failure Daughter 20    Prior to Admission medications   Medication Sig Start Date End Date Taking? Authorizing Provider  sertraline (ZOLOFT) 25 MG tablet Take 25 mg by mouth daily. 06/19/22  Yes [provider]  sertraline (ZOLOFT) 50 MG tablet Take 50 mg by mouth daily.   Yes [provider]  traZODone (DESYREL) 150 MG tablet Take 150 mg by mouth at bedtime.   Yes [provider]  acetaminophen (TYLENOL) 500 MG tablet Take 500 mg by mouth daily at 12 noon. As needed    [provider]  diltiazem (CARDIZEM CD) 180 MG 24 hr capsule Take 1 capsule (180 mg total) by mouth daily. 03/30/20   Ngetich, Dinah C, NP  metoprolol succinate (TOPROL-XL) 25 MG 24 hr tablet Take 0.5 tablets (12.5 mg total) by mouth daily. 08/28/20   Fargo, Amy E, NP  diltiazem (DILACOR XR) 180 MG 24 hr capsule Take 1 capsule (180 mg total) by mouth daily. 12/04/10 12/30/11  Michele Mcalpine, MD    Physical Exam: Vitals:   07/14/22 0315 07/14/22 0330 07/14/22 0417 07/14/22 0420  BP:   (!) 214/102 (!) 208/88  Pulse: 90 77 75   Resp:   16   Temp:   97.8 F (36.6 C)   TempSrc:   Oral   SpO2: 98% 96% 98%     Physical Exam Vitals reviewed.  Constitutional:      General: She is not in acute distress. HENT:     Head: Normocephalic.     Comments: Left forehead hematoma Eyes:     Extraocular Movements: Extraocular movements intact.  Cardiovascular:     Rate and Rhythm: Normal rate and regular rhythm.     Pulses: Normal pulses.  Pulmonary:     Effort: Pulmonary effort is normal. No respiratory distress.     Breath sounds: Normal breath sounds. No wheezing or rales.  Abdominal:      General: Bowel sounds are normal. There is no distension.     Palpations: Abdomen is soft.     Tenderness: There is abdominal tenderness.     Comments: Suprapubic tenderness  Musculoskeletal:     Cervical back: Normal range of motion.     Right lower leg: No edema.     Left lower leg: No edema.  Skin:    General: Skin is warm and dry.  Neurological:  General: No focal deficit present.     Mental Status: She is alert.     Labs on Admission: I have personally reviewed following labs and imaging studies  CBC: Recent Labs  Lab 07/14/22 0122  WBC 6.5  NEUTROABS 5.3  HGB 6.5*  HCT 19.9*  MCV 99.0  PLT 112*   Basic Metabolic Panel: Recent Labs  Lab 07/14/22 0122  NA 138  K 3.5  CL 102  CO2 25  GLUCOSE 134*  BUN 17  CREATININE 0.56  CALCIUM 9.2   GFR: CrCl cannot be calculated (Unknown ideal weight.). Liver Function Tests: No results for input(s): "AST", "ALT", "ALKPHOS", "BILITOT", "PROT", "ALBUMIN" in the last 168 hours. No results for input(s): "LIPASE", "AMYLASE" in the last 168 hours. No results for input(s): "AMMONIA" in the last 168 hours. Coagulation Profile: No results for input(s): "INR", "PROTIME" in the last 168 hours. Cardiac Enzymes: No results for input(s): "CKTOTAL", "CKMB", "CKMBINDEX", "TROPONINI" in the last 168 hours. BNP (last 3 results) No results for input(s): "PROBNP" in the last 8760 hours. HbA1C: No results for input(s): "HGBA1C" in the last 72 hours. CBG: No results for input(s): "GLUCAP" in the last 168 hours. Lipid Profile: No results for input(s): "CHOL", "HDL", "LDLCALC", "TRIG", "CHOLHDL", "LDLDIRECT" in the last 72 hours. Thyroid Function Tests: No results for input(s): "TSH", "T4TOTAL", "FREET4", "T3FREE", "THYROIDAB" in the last 72 hours. Anemia Panel: No results for input(s): "VITAMINB12", "FOLATE", "FERRITIN", "TIBC", "IRON", "RETICCTPCT" in the last 72 hours. Urine analysis:    Component Value Date/Time   COLORURINE  YELLOW 07/14/2022 0122   APPEARANCEUR CLEAR 07/14/2022 0122   LABSPEC 1.010 07/14/2022 0122   PHURINE 7.0 07/14/2022 0122   GLUCOSEU NEGATIVE 07/14/2022 0122   GLUCOSEU NEGATIVE 05/15/2006 1125   HGBUR MODERATE (A) 07/14/2022 0122   BILIRUBINUR NEGATIVE 07/14/2022 0122   BILIRUBINUR Positive 01/10/2020 1437   KETONESUR NEGATIVE 07/14/2022 0122   PROTEINUR NEGATIVE 07/14/2022 0122   UROBILINOGEN 1.0 01/10/2020 1437   UROBILINOGEN 0.2 mg/dL 16/11/9602 5409   NITRITE POSITIVE (A) 07/14/2022 0122   LEUKOCYTESUR LARGE (A) 07/14/2022 0122    Radiological Exams on Admission: DG Thoracic Spine 2 View  Result Date: 07/14/2022 CLINICAL DATA:  Fall. EXAM: THORACIC SPINE 2 VIEWS; LUMBAR SPINE - COMPLETE 4+ VIEW; DG HIP (WITH OR WITHOUT PELVIS) 2-3V LEFT COMPARISON:  07/20/2021, 01/10/2020. FINDINGS: Thoracic spine: There is a mild compression deformity in the superior endplate at T12, best seen on lumbar spine images. Evaluation of the upper cervical spine is limited due to osteopenia and overlying structures. Alignment is normal. Multilevel degenerative endplate changes are noted. Lumbar spine: There is a compression deformity in the superior endplate of L3 with loss of vertebral body height of 50%. Alignment is normal. Multilevel intervertebral disc space narrowing, degenerative endplate changes, and facet arthropathy. There is atherosclerotic calcification of the aorta. Cholecystectomy clips are noted in the right upper quadrant. There is evidence of prior hernia repair. Pelvis with left hip: There is no acute fracture or dislocation. Old healed fractures are noted at the superior and inferior pubic rami on the left. Joint space is maintained. IMPRESSION: 1. Compression deformities in the superior endplates at T12 and L3, indeterminate in age. 2. Multilevel degenerative changes in the thoracic and lumbar spine. 3. No acute fracture or dislocation at the hips bilaterally. Electronically Signed   By: Thornell Sartorius M.D.   On: 07/14/2022 00:55   DG Lumbar Spine Complete  Result Date: 07/14/2022 CLINICAL DATA:  Fall. EXAM: THORACIC SPINE  2 VIEWS; LUMBAR SPINE - COMPLETE 4+ VIEW; DG HIP (WITH OR WITHOUT PELVIS) 2-3V LEFT COMPARISON:  07/20/2021, 01/10/2020. FINDINGS: Thoracic spine: There is a mild compression deformity in the superior endplate at T12, best seen on lumbar spine images. Evaluation of the upper cervical spine is limited due to osteopenia and overlying structures. Alignment is normal. Multilevel degenerative endplate changes are noted. Lumbar spine: There is a compression deformity in the superior endplate of L3 with loss of vertebral body height of 50%. Alignment is normal. Multilevel intervertebral disc space narrowing, degenerative endplate changes, and facet arthropathy. There is atherosclerotic calcification of the aorta. Cholecystectomy clips are noted in the right upper quadrant. There is evidence of prior hernia repair. Pelvis with left hip: There is no acute fracture or dislocation. Old healed fractures are noted at the superior and inferior pubic rami on the left. Joint space is maintained. IMPRESSION: 1. Compression deformities in the superior endplates at T12 and L3, indeterminate in age. 2. Multilevel degenerative changes in the thoracic and lumbar spine. 3. No acute fracture or dislocation at the hips bilaterally. Electronically Signed   By: Thornell Sartorius M.D.   On: 07/14/2022 00:55   DG Hip Unilat W or Wo Pelvis 2-3 Views Left  Result Date: 07/14/2022 CLINICAL DATA:  Fall. EXAM: THORACIC SPINE 2 VIEWS; LUMBAR SPINE - COMPLETE 4+ VIEW; DG HIP (WITH OR WITHOUT PELVIS) 2-3V LEFT COMPARISON:  07/20/2021, 01/10/2020. FINDINGS: Thoracic spine: There is a mild compression deformity in the superior endplate at T12, best seen on lumbar spine images. Evaluation of the upper cervical spine is limited due to osteopenia and overlying structures. Alignment is normal. Multilevel degenerative endplate  changes are noted. Lumbar spine: There is a compression deformity in the superior endplate of L3 with loss of vertebral body height of 50%. Alignment is normal. Multilevel intervertebral disc space narrowing, degenerative endplate changes, and facet arthropathy. There is atherosclerotic calcification of the aorta. Cholecystectomy clips are noted in the right upper quadrant. There is evidence of prior hernia repair. Pelvis with left hip: There is no acute fracture or dislocation. Old healed fractures are noted at the superior and inferior pubic rami on the left. Joint space is maintained. IMPRESSION: 1. Compression deformities in the superior endplates at T12 and L3, indeterminate in age. 2. Multilevel degenerative changes in the thoracic and lumbar spine. 3. No acute fracture or dislocation at the hips bilaterally. Electronically Signed   By: Thornell Sartorius M.D.   On: 07/14/2022 00:55   DG Wrist Complete Right  Result Date: 07/14/2022 CLINICAL DATA:  fall, R forearm and wrist injury EXAM: RIGHT WRIST - COMPLETE 3+ VIEW COMPARISON:  None Available. FINDINGS: Diffusely decreased bone density. There is no evidence of fracture or dislocation. Severe degenerative changes of the carpal metatarsal joints. Soft tissues are unremarkable. Vascular calcifications. IMPRESSION: No acute displaced fracture or dislocation. Electronically Signed   By: Tish Frederickson M.D.   On: 07/14/2022 00:54   DG Forearm Right  Result Date: 07/14/2022 CLINICAL DATA:  fall, R forearm and wrist injury EXAM: RIGHT FOREARM - 2 VIEW COMPARISON:  None Available. FINDINGS: Diffusely decreased bone density. Degenerative changes of the wrist and elbow noted. There is no evidence of fracture or other focal bone lesions. Nonaggressive appearing periosteal reaction along the radius shaft. Soft tissues are unremarkable. IMPRESSION: No acute displaced fracture or dislocation. Electronically Signed   By: Tish Frederickson M.D.   On: 07/14/2022 00:52    CT Head Wo Contrast  Result Date: 07/14/2022  CLINICAL DATA:  Neck trauma (Age >= 65y); Head trauma, minor (Age >= 65y) EXAM: CT HEAD WITHOUT CONTRAST CT CERVICAL SPINE WITHOUT CONTRAST TECHNIQUE: Multidetector CT imaging of the head and cervical spine was performed following the standard protocol without intravenous contrast. Multiplanar CT image reconstructions of the cervical spine were also generated. RADIATION DOSE REDUCTION: This exam was performed according to the departmental dose-optimization program which includes automated exposure control, adjustment of the mA and/or kV according to patient size and/or use of iterative reconstruction technique. COMPARISON:  None Available. FINDINGS: CT HEAD FINDINGS Brain: Cerebral ventricle sizes are concordant with the degree of cerebral volume loss. Patchy and confluent areas of decreased attenuation are noted throughout the deep and periventricular white matter of the cerebral hemispheres bilaterally, compatible with chronic microvascular ischemic disease. No evidence of large-territorial acute infarction. No parenchymal hemorrhage. No mass lesion. No extra-axial collection. No mass effect or midline shift. No hydrocephalus. Basilar cisterns are patent. Vascular: No hyperdense vessel. Atherosclerotic calcifications are present within the cavernous internal carotid and vertebral arteries. Skull: No acute fracture or focal lesion. Sinuses/Orbits: Left maxillary sinus mucosal thickening. Otherwise paranasal sinuses and mastoid air cells are clear. Bilateral lens replacement. Otherwise the orbits are unremarkable. Other: None. CT CERVICAL SPINE FINDINGS Alignment: Grade 1 anterolisthesis C2 on C3, C3 on C4, C4 on C5, C5 on C6 on C6 on C7. Skull base and vertebrae: Multilevel moderate to severe degenerative changes spine. Multilevel moderate to severe right osseous neural foraminal stenosis. No acute fracture. No aggressive appearing focal osseous lesion or focal  pathologic process. Soft tissues and spinal canal: No prevertebral fluid or swelling. No visible canal hematoma. Upper chest: Unremarkable. Other: None. IMPRESSION: 1. No acute intracranial abnormality. 2. No acute displaced fracture or traumatic listhesis of the cervical spine. Electronically Signed   By: Tish Frederickson M.D.   On: 07/14/2022 00:23   CT Cervical Spine Wo Contrast  Result Date: 07/14/2022 CLINICAL DATA:  Neck trauma (Age >= 65y); Head trauma, minor (Age >= 65y) EXAM: CT HEAD WITHOUT CONTRAST CT CERVICAL SPINE WITHOUT CONTRAST TECHNIQUE: Multidetector CT imaging of the head and cervical spine was performed following the standard protocol without intravenous contrast. Multiplanar CT image reconstructions of the cervical spine were also generated. RADIATION DOSE REDUCTION: This exam was performed according to the departmental dose-optimization program which includes automated exposure control, adjustment of the mA and/or kV according to patient size and/or use of iterative reconstruction technique. COMPARISON:  None Available. FINDINGS: CT HEAD FINDINGS Brain: Cerebral ventricle sizes are concordant with the degree of cerebral volume loss. Patchy and confluent areas of decreased attenuation are noted throughout the deep and periventricular white matter of the cerebral hemispheres bilaterally, compatible with chronic microvascular ischemic disease. No evidence of large-territorial acute infarction. No parenchymal hemorrhage. No mass lesion. No extra-axial collection. No mass effect or midline shift. No hydrocephalus. Basilar cisterns are patent. Vascular: No hyperdense vessel. Atherosclerotic calcifications are present within the cavernous internal carotid and vertebral arteries. Skull: No acute fracture or focal lesion. Sinuses/Orbits: Left maxillary sinus mucosal thickening. Otherwise paranasal sinuses and mastoid air cells are clear. Bilateral lens replacement. Otherwise the orbits are  unremarkable. Other: None. CT CERVICAL SPINE FINDINGS Alignment: Grade 1 anterolisthesis C2 on C3, C3 on C4, C4 on C5, C5 on C6 on C6 on C7. Skull base and vertebrae: Multilevel moderate to severe degenerative changes spine. Multilevel moderate to severe right osseous neural foraminal stenosis. No acute fracture. No aggressive appearing focal osseous lesion or focal pathologic process.  Soft tissues and spinal canal: No prevertebral fluid or swelling. No visible canal hematoma. Upper chest: Unremarkable. Other: None. IMPRESSION: 1. No acute intracranial abnormality. 2. No acute displaced fracture or traumatic listhesis of the cervical spine. Electronically Signed   By: Tish Frederickson M.D.   On: 07/14/2022 00:23    EKG: Independently reviewed.  Sinus rhythm, PVCs, baseline wander, QTc 533.  Poor quality study with baseline wander.  Assessment and Plan  T12 and L3 compression fractures Patient had a mechanical fall yesterday and per family complaining of severe mid/lower back pain since then. X-rays of thoracic and lumbar spine showing compression deformities in the superior endplates at T12 and L3, indeterminate in age.  Continue pain management, PT/OT, TOC consulted.  UTI UA with signs of infection.  No fever, leukocytosis, or signs of sepsis.  Continue ceftriaxone.  Urine culture pending.  Patient does have suprapubic tenderness on exam and has has not urinated since arrival to the hospital.  Bladder scan ordered and if it confirms urinary retention then in-N-Out cath as needed.  If bladder scan negative and still having abdominal tenderness, then consider abdominal imaging.  Anemia Hemoglobin 6.5 with MCV 99.0.  Family reporting chronic epistaxis due to patient constantly picking her nose but no GI bleed symptoms reported.  No unintentional weight loss reported by family.  She is not on anticoagulation or antiplatelet agents.  Type and screen, 1 unit PRBCs ordered after obtaining consent from the  patient's daughters.  Anemia panel and FOBT.  Addendum 07/14/2022 at 6:11 AM: Hemoglobin was previously 11.6 in June 2023.  Hypertensive urgency Systolic above 200.  IV hydralazine PRN.  Continue home meds after pharmacy med rec is done.  Continue pain management.  Mild thrombocytopenia Continue to monitor labs.  QT prolongation EKG done in the ED is poor quality with baseline wander, repeat EKG pending.  Monitor potassium and magnesium levels, replace as needed.  Avoid QT prolonging drugs.  Dementia Delirium precautions.  Hyperlipidemia Anxiety Pharmacy med rec pending.  DVT prophylaxis: SCDs Code Status: DNR/DNI (discussed with the patient's daughters) Family Communication: Daughter [name] and [name] at bedside. Level of care: Med-Surg Admission status: It is my clinical opinion that referral for OBSERVATION is reasonable and necessary in this patient based on the above information provided. The aforementioned taken together are felt to place the patient at high risk for further clinical deterioration. However, it is anticipated that the patient may be medically stable for discharge from the hospital within 24 to 48 hours.   John Giovanni MD [facility]  If 7PM-7AM, please contact night-coverage [email or web address]  07/14/2022, 4:40 AM

## 2022-07-14 NOTE — Hospital Course (Addendum)
89 year old woman presented from memory care unit at [facility] after mechanical fall.  Imaging revealed compression deformities T12 and L3, was admitted for inability to bear weight and pain control.

## 2022-07-14 NOTE — ED Notes (Signed)
Pt assisted up to bsc with 2 person total assist by this RN and Okey Regal, Charity fundraiser. Pt was not able to bear weight due to her back pain. Very painful for patient to stand and pivot to the bsc. Assisted back to bed after using the bsc. MD updated.

## 2022-07-15 DIAGNOSIS — R319 Hematuria, unspecified: Secondary | ICD-10-CM | POA: Diagnosis not present

## 2022-07-15 DIAGNOSIS — S22080A Wedge compression fracture of T11-T12 vertebra, initial encounter for closed fracture: Secondary | ICD-10-CM | POA: Diagnosis not present

## 2022-07-15 DIAGNOSIS — S32030A Wedge compression fracture of third lumbar vertebra, initial encounter for closed fracture: Secondary | ICD-10-CM

## 2022-07-15 DIAGNOSIS — N390 Urinary tract infection, site not specified: Secondary | ICD-10-CM

## 2022-07-15 LAB — TYPE AND SCREEN
ABO/RH(D): A POS
Antibody Screen: NEGATIVE
Unit division: 0

## 2022-07-15 LAB — BPAM RBC
Blood Product Expiration Date: 202406202359
ISSUE DATE / TIME: 202405260929

## 2022-07-15 MED ORDER — DILTIAZEM HCL ER 90 MG PO CP12
90.0000 mg | ORAL_CAPSULE | Freq: Two times a day (BID) | ORAL | Status: DC
  Administered 2022-07-15 – 2022-07-19 (×9): 90 mg via ORAL
  Filled 2022-07-15 (×10): qty 1

## 2022-07-15 MED ORDER — LACTATED RINGERS IV SOLN: INTRAVENOUS | Status: DC

## 2022-07-15 MED ORDER — METHOCARBAMOL 500 MG PO TABS
500.0000 mg | ORAL_TABLET | Freq: Three times a day (TID) | ORAL | Status: DC | PRN
  Administered 2022-07-15 – 2022-07-19 (×6): 500 mg via ORAL
  Filled 2022-07-15 (×6): qty 1

## 2022-07-15 MED ORDER — CEPHALEXIN 500 MG PO CAPS
500.0000 mg | ORAL_CAPSULE | Freq: Two times a day (BID) | ORAL | Status: AC
  Administered 2022-07-15 (×2): 500 mg via ORAL
  Filled 2022-07-15 (×2): qty 1

## 2022-07-15 MED ORDER — TRAMADOL HCL 50 MG PO TABS
50.0000 mg | ORAL_TABLET | Freq: Two times a day (BID) | ORAL | Status: DC | PRN
  Administered 2022-07-15 – 2022-07-16 (×3): 50 mg via ORAL
  Filled 2022-07-15 (×4): qty 1

## 2022-07-15 NOTE — Evaluation (Signed)
Occupational Therapy Evaluation Patient Details Name: [name] MRN: [id number] DOB: 1923-11-19 Today's Date: 07/15/2022   History of Present Illness Patient is a 89 year old female who presented after a mechainical fall at memory care facility. Patient was found to have T12 and L3 compression deformities, back pain, possible UTI, acute urinary retention, and spurious anemia. PMH: dementia,   Clinical Impression   Patient is a 89 year old female who was admitted for above. Patient was living at memory care and completed functional mobility and ADLs at walker level. Patient currently is in 10/10 pain in bed with family educated on sidelying with pillow positioning to promote proper body mechanics to alleviate pain and increase comfort. MD and nurse made aware of patients pain levels. Patient was noted to have decreased functional activity tolernace, decreased ROM, decreased BUE strength, decreased endurance, decreased sitting balance, decreased standing balanced, decreased safety awareness, and decreased knowledge of AE/AD impacting participation in ADLs. Patient would continue to benefit from skilled OT services at this time while admitted and after d/c to address noted deficits in order to improve overall safety and independence in ADLs.        Recommendations for follow up therapy are one component of a multi-disciplinary discharge planning process, led by the attending physician.  Recommendations may be updated based on patient status, additional functional criteria and insurance authorization.   Assistance Recommended at Discharge Frequent or constant Supervision/Assistance  Patient can return home with the following Two people to help with walking and/or transfers;A lot of help with bathing/dressing/bathroom;Assistance with cooking/housework;Direct supervision/assist for medications management;Assist for transportation;Help with stairs or ramp for entrance;Direct supervision/assist for  financial management;Assistance with feeding    Functional Status Assessment  Patient has had a recent decline in their functional status and demonstrates the ability to make significant improvements in function in a reasonable and predictable amount of time.  Equipment Recommendations  None recommended by OT       Precautions / Restrictions Precautions Precautions: Back;Fall Precaution Comments: back prec for comfort Restrictions Weight Bearing Restrictions: No      Mobility Bed Mobility Overal bed mobility: Needs Assistance Bed Mobility: Rolling Rolling: Min assist         General bed mobility comments: rolling to L side in bed to reduce pressure on back and promote proper body mechanics.              ADL either performed or assessed with clinical judgement   ADL Overall ADL's : Needs assistance/impaired Eating/Feeding: Total assistance   Grooming: Total assistance   Upper Body Bathing: Total assistance   Lower Body Bathing: Total assistance   Upper Body Dressing : Total assistance   Lower Body Dressing: Total assistance       Toileting- Clothing Manipulation and Hygiene: Total assistance         General ADL Comments: patients current level of pain is impacting patients ability to participate in ADLs at this time. daughter present in room asking about pain medications. concerns were communicated to nurse and MD via secure chat                  Pertinent Vitals/Pain Pain Assessment Pain Assessment: Faces Faces Pain Scale: Hurts worst Pain Location: back Pain Descriptors / Indicators: Crying, Grimacing, Moaning Pain Intervention(s): Limited activity within patient's tolerance, Monitored during session, Repositioned, Premedicated before session        Extremity/Trunk Assessment Upper Extremity Assessment Upper Extremity Assessment: Difficult to assess due to impaired cognition  Lower Extremity Assessment Lower Extremity Assessment: Defer  to PT evaluation RLE Deficits / Details: AAROM grossly WFL LLE Deficits / Details: AAROM grossly WFL   Cervical / Trunk Assessment Cervical / Trunk Assessment: Kyphotic   Communication     Cognition Arousal/Alertness: Awake/alert Behavior During Therapy: Flat affect, Restless Overall Cognitive Status: History of cognitive impairments - at baseline             General Comments: patient is able to follow simple commands, daughter was present during session.                Home Living Family/patient expects to be discharged to:: Assisted living         Home Equipment: Rolling Walker (2 wheels)          Prior Functioning/Environment Prior Level of Function : Needs assist       Physical Assist : ADLs (physical)   ADLs (physical): Bathing;Dressing;Toileting Mobility Comments: walking at memory care with walker per daughter report. ADLs Comments: had staff assistance for throughness of ADLs.        OT Problem List: Decreased activity tolerance;Impaired balance (sitting and/or standing);Decreased coordination;Decreased safety awareness;Decreased knowledge of precautions;Pain      OT Treatment/Interventions: Energy conservation;Self-care/ADL training;Therapeutic exercise;DME and/or AE instruction;Therapeutic activities;Patient/family education;Balance training    OT Goals(Current goals can be found in the care plan section) Acute Rehab OT Goals Patient Stated Goal: none stated OT Goal Formulation: With family Time For Goal Achievement: 07/29/22 Potential to Achieve Goals: Fair  OT Frequency: Min 2X/week       AM-PAC OT "6 Clicks" Daily Activity     Outcome Measure Help from another person eating meals?: Total Help from another person taking care of personal grooming?: Total Help from another person toileting, which includes using toliet, bedpan, or urinal?: Total Help from another person bathing (including washing, rinsing, drying)?: Total Help from another  person to put on and taking off regular upper body clothing?: Total Help from another person to put on and taking off regular lower body clothing?: Total 6 Click Score: 6   End of Session    Activity Tolerance: Patient limited by pain;Patient limited by fatigue;Patient limited by lethargy Patient left: in bed;with call bell/phone within reach;with bed alarm set;with family/visitor present  OT Visit Diagnosis: Pain;History of falling (Z91.81)                Time: 1610-9604 OT Time Calculation (min): 18 min Charges:  OT General Charges $OT Visit: 1 Visit OT Evaluation $OT Eval Low Complexity: 1 Low  Makilah Dowda OTR/L, MS Acute Rehabilitation Department Office# [phone]   Selinda Flavin 07/15/2022, 12:28 PM

## 2022-07-15 NOTE — Plan of Care (Signed)
  Problem: Activity: Goal: Risk for activity intolerance will decrease Outcome: Progressing   Problem: Coping: Goal: Level of anxiety will decrease Outcome: Progressing   Problem: Elimination: Goal: Will not experience complications related to urinary retention Outcome: Progressing   

## 2022-07-15 NOTE — Progress Notes (Signed)
OT Cancellation Note  Patient Details Name: [name] MRN: [id number] DOB: 04/12/1923   Cancelled Treatment:    Reason Eval/Treat Not Completed: Pain limiting ability to participate Patient was found in bed calling out with noted restlessness and IV in hand, patient reported being in a lot of pain nurse made aware. Patient only has pain medications available through IV at this time. OT to hold for patient to have pain medications prior to attempting to move.  Rosalio Loud, MS Acute Rehabilitation Department Office# [phone]  07/15/2022, 8:19 AM

## 2022-07-15 NOTE — Progress Notes (Signed)
Patient incontinent for large amount of urine in her bed, patient bathed, complete bed change done, bladder scanned for <405, straight cath done which returned 600 cc's of clear amber urine, this was the 3rd straight cath since admission, she tolerated the procedure well, now resting.

## 2022-07-15 NOTE — Progress Notes (Signed)
Patient has been sleeping most of the shift so far and also slept on dayshift, arouses to voice/touch when care is being performed, becomes very agitated and combative when interventions are attempted such as repositioning, bladder scanning, physical assessments, and feeding/med taking, last bladder scan at shift change was 149, patient did not have any po intake and no iv fluids running, will continue to monitor for incontinent voiding and bladder scan as patient will allow, patient had also removed peripheral iv access from left arm, new iv placed by this nurse and wrapped to protect, floor mats down, scd's not put on due to agitation, continuous pulse ox on forehead reading well, bed alarm on, door open, will monitor.

## 2022-07-15 NOTE — Evaluation (Signed)
Physical Therapy Evaluation Patient Details Name: [name] MRN: [id number] DOB: 1923-04-04 Today's Date: 07/15/2022  History of Present Illness  89 year old woman presented from memory care unit at [facility] after mechanical fall.  Imaging revealed compression deformities T12 and L3, was admitted for inability to bear weight and pain control. PMH: macular hole L eye, dementia, anxiety, DJD  Clinical Impression  Pt admitted with above diagnosis.  Pt crying out, c/o pain and needing "help"; pt calmed to voice, sat EOB with min assist, transfers deferred d/t pt c/o pain in sitting position; RN giving meds, pt cooperative with cues; repositioned, pt again calm on PT departure, not crying out in pain.   Pt currently with functional limitations due to the deficits listed below (see PT Problem List). Pt will benefit from acute skilled PT to increase their independence and safety with mobility to allow discharge.          Recommendations for follow up therapy are one component of a multi-disciplinary discharge planning process, led by the attending physician.  Recommendations may be updated based on patient status, additional functional criteria and insurance authorization.  Follow Up Recommendations       Assistance Recommended at Discharge Intermittent Supervision/Assistance  Patient can return home with the following  Assistance with cooking/housework;Assist for transportation;Help with stairs or ramp for entrance;Direct supervision/assist for medications management;Assistance with feeding;Direct supervision/assist for financial management;A lot of help with walking and/or transfers;A lot of help with bathing/dressing/bathroom    Equipment Recommendations None recommended by PT  Recommendations for Other Services       Functional Status Assessment Patient has had a recent decline in their functional status and demonstrates the ability to make significant improvements in function in a  reasonable and predictable amount of time.     Precautions / Restrictions Precautions Precautions: Back;Fall Precaution Comments: back prec for comfort Restrictions Weight Bearing Restrictions: No      Mobility  Bed Mobility Overal bed mobility: Needs Assistance Bed Mobility: Supine to Sit, Sit to Supine     Supine to sit: Min assist Sit to supine: Min assist   General bed mobility comments: assist to elevate trunk and to bring LEs on to bed to return to supine    Transfers                   General transfer comment: deferred d/t pt c/o pain    Ambulation/Gait                  Stairs            Wheelchair Mobility    Modified Rankin (Stroke Patients Only)       Balance Overall balance assessment: History of Falls, Needs assistance Sitting-balance support: Bilateral upper extremity supported, Feet supported Sitting balance-Leahy Scale: Fair Sitting balance - Comments: able to maintain static sitting with close supervision       Standing balance comment: unable d/t pain/cognition                             Pertinent Vitals/Pain Pain Assessment Pain Assessment: Faces Faces Pain Scale: Hurts even more Pain Location: pt cannot specify Pain Descriptors / Indicators: Crying, Grimacing, Moaning Pain Intervention(s): Limited activity within patient's tolerance, Monitored during session, Repositioned, RN gave pain meds during session    Home Living Family/patient expects to be discharged to:: Assisted living  Prior Function Prior Level of Function : Patient poor historian/Family not available             Mobility Comments: pt states "I walk when I can"       Hand Dominance        Extremity/Trunk Assessment   Upper Extremity Assessment Upper Extremity Assessment: Defer to OT evaluation    Lower Extremity Assessment Lower Extremity Assessment: Generalized weakness;RLE  deficits/detail;LLE deficits/detail;Difficult to assess due to impaired cognition RLE Deficits / Details: AAROM grossly WFL LLE Deficits / Details: AAROM grossly WFL       Communication      Cognition Arousal/Alertness: Awake/alert Behavior During Therapy: WFL for tasks assessed/performed Overall Cognitive Status: History of cognitive impairments - at baseline                                 General Comments: pt follows simple commands with multi-modal cues        General Comments      Exercises     Assessment/Plan    PT Assessment Patient needs continued PT services  PT Problem List Decreased strength;Decreased activity tolerance;Decreased balance;Decreased mobility;Pain;Decreased knowledge of precautions;Decreased knowledge of use of DME       PT Treatment Interventions Gait training;Functional mobility training;Therapeutic activities;Patient/family education;Therapeutic exercise    PT Goals (Current goals can be found in the Care Plan section)  Acute Rehab PT Goals PT Goal Formulation: Patient unable to participate in goal setting Time For Goal Achievement: 07/29/22 Potential to Achieve Goals: Fair    Frequency Min 1X/week     Co-evaluation               AM-PAC PT "6 Clicks" Mobility  Outcome Measure Help needed turning from your back to your side while in a flat bed without using bedrails?: A Little Help needed moving from lying on your back to sitting on the side of a flat bed without using bedrails?: A Little Help needed moving to and from a bed to a chair (including a wheelchair)?: Total Help needed standing up from a chair using your arms (e.g., wheelchair or bedside chair)?: Total Help needed to walk in hospital room?: Total Help needed climbing 3-5 steps with a railing? : Total 6 Click Score: 10    End of Session   Activity Tolerance: Patient limited by pain Patient left: with call bell/phone within reach;in bed;with bed alarm  set   PT Visit Diagnosis: Other abnormalities of gait and mobility (R26.89)    Time: 1610-9604 PT Time Calculation (min) (ACUTE ONLY): 19 min   Charges:   PT Evaluation $PT Eval Low Complexity: 1 Low          Trevontae Lindahl, PT  Acute Rehab Dept [facility]) [phone]  07/15/2022   [facility] 07/15/2022, 9:23 AM

## 2022-07-15 NOTE — TOC Initial Note (Signed)
Transition of Care [facility]) - Initial/Assessment Note   Patient Details  Name: [name] MRN: [id number] Date of Birth: 01-12-1924  [facility] of [facility]) CM/SW Contact:    Ewing Schlein, LCSW Phone Number: 07/15/2022, 3:40 PM  Clinical Narrative: Patient will need SNF prior to returning to memory care at [facility]. CSW spoke with both daughters, Achille Rich and Marguerita Beards, regarding recommendations and both are in agreement with SNF. FL2 done; PASRR received. Initial referral faxed out. TOC awaiting bed offers.  Expected Discharge Plan: Skilled Nursing Facility Barriers to Discharge: Insurance Authorization, SNF Pending bed offer, Continued Medical Work up  Patient Goals and CMS Choice Patient states their goals for this hospitalization and ongoing recovery are:: Go to short-term rehab CMS Medicare.gov Compare Post Acute Care list provided to:: Patient Represenative (must comment) Choice offered to / list presented to : Adult Children  Expected Discharge Plan and Services In-house Referral: Clinical Social Work Post Acute Care Choice: Skilled Nursing Facility Living arrangements for the past 2 months: Assisted Living Facility           DME Arranged: N/A DME Agency: NA  Prior Living Arrangements/Services Living arrangements for the past 2 months: Assisted Living Facility Lives with:: Facility Resident Patient language and need for interpreter reviewed:: Yes Do you feel safe going back to the place where you live?: Yes      Need for Family Participation in Patient Care: Yes (Comment) (Patient has dementia and is oriented to self only.) Care giver support system in place?: Yes (comment) Criminal Activity/Legal Involvement Pertinent to Current Situation/Hospitalization: No - Comment as needed  Activities of Daily Living Home Assistive Devices/Equipment: Eyeglasses, Environmental consultant (specify type), Hearing aid, Dentures (specify type) ADL Screening (condition at time of  admission) Patient's cognitive ability adequate to safely complete daily activities?: No Is the patient deaf or have difficulty hearing?: Yes Does the patient have difficulty seeing, even when wearing glasses/contacts?: No Does the patient have difficulty concentrating, remembering, or making decisions?: Yes Patient able to express need for assistance with ADLs?: No Does the patient have difficulty dressing or bathing?: Yes Independently performs ADLs?: No Communication: Independent (has dementia) Dressing (OT): Needs assistance Is this a change from baseline?: Change from baseline, expected to last >3 days Grooming: Needs assistance Is this a change from baseline?: Change from baseline, expected to last >3 days Feeding: Needs assistance Is this a change from baseline?: Change from baseline, expected to last >3 days Bathing: Needs assistance Is this a change from baseline?: Change from baseline, expected to last >3 days Toileting: Needs assistance Is this a change from baseline?: Change from baseline, expected to last >3days In/Out Bed: Needs assistance Is this a change from baseline?: Change from baseline, expected to last >3 days Walks in Home: Needs assistance Is this a change from baseline?: Change from baseline, expected to last >3 days Does the patient have difficulty walking or climbing stairs?: Yes Weakness of Legs: Both Weakness of Arms/Hands: Both  Permission Sought/Granted Permission sought to share information with : Facility Industrial/product designer granted to share information with : Yes, Verbal Permission Granted Permission granted to share info w AGENCY: SNFs  Emotional Assessment Appearance:: Appears stated age Orientation: : Oriented to Self Alcohol / Substance Use: Not Applicable Psych Involvement: No (comment)  Admission diagnosis:  Acute cystitis without hematuria [N30.00] Fall, initial encounter [W19.XXXA] Compression fracture of T12 vertebra,  initial encounter (HCC) [S22.080A] Compression fracture of L3 lumbar vertebra, closed, initial encounter (HCC) [S32.030A] Anemia,  unspecified type [D64.9] Patient Active Problem List   Diagnosis Date Noted   Acute cystitis without hematuria 07/14/2022   Vertebral compression fracture (HCC) 07/14/2022   Hypertensive urgency 07/14/2022   Thrombocytopenia (HCC) 07/14/2022   Fall 07/14/2022   Pressure injury of skin 07/14/2022   Back pain 07/14/2022   Acute urinary retention 07/14/2022   Compression fracture of L3 vertebra (HCC) 07/14/2022   UTI (urinary tract infection) 10/08/2018   Encounter for Medicare annual wellness exam 06/17/2018   Abdominal pain, acute, left lower quadrant 09/01/2017   Other chest pain 09/01/2017   CKD (chronic kidney disease), symptom management only, stage 3 (moderate) (HCC) 07/03/2017   Cognitive changes 03/05/2017   Healthcare maintenance 03/05/2017   Hypertension 03/05/2017   Cervicalgia 10/10/2016   Lower extremity edema 10/03/2016   Dyspnea on exertion 10/03/2016   Right arm pain 09/04/2016   Migraine 09/04/2016   Numerous moles 09/04/2016   Frequent headaches 09/04/2016   Chronic insomnia 05/11/2015   Vitamin D deficiency 05/17/2014   Counseling regarding end of life decision making 05/03/2014   Dementia (HCC) 05/03/2014   Lumbago 05/11/2012   Vitamin B 12 deficiency 05/11/2012   Dysphagia, pharyngoesophageal phase 12/17/2010   Esophageal reflux 12/17/2010   Abdominal hernia 12/17/2010   Anemia, iron deficiency 12/06/2010   HYPERCHOLESTEROLEMIA, MILD 11/19/2008   Venous (peripheral) insufficiency 11/14/2007   Essential hypertension, benign 05/14/2007   Arrhythmia 05/14/2007   CONSTIPATION, CHRONIC 05/14/2007   Osteoporosis 05/14/2007   Benign essential tremor 05/14/2007   Anemia 12/18/2006   HIATAL HERNIA 12/18/2006   DEGENERATIVE JOINT DISEASE, GENERALIZED 12/18/2006   PCP:  Oneita Hurt, No Pharmacy:   [facility] - [place], [place] - [address] [place] [address] Phone: [phone] Fax: [phone]  [facility]) - [place], [place] - [address] [place] [address] [place]  [address] Phone: [phone] Fax: [phone]  Social Determinants of Health (SDOH) Social History: SDOH Screenings   Food Insecurity: Patient Unable To Answer (07/14/2022)  Housing: High Risk (07/14/2022)  Transportation Needs: Patient Unable To Answer (07/14/2022)  Utilities: Patient Unable To Answer (07/14/2022)  Depression (PHQ2-9): Low Risk  (03/22/2020)  Tobacco Use: Low Risk  (07/13/2022)   SDOH Interventions: Housing Interventions: Intervention Not Indicated, Inpatient TOC (Patient resides at [facility]. There is no housing risk.)  Readmission Risk Interventions     No data to display

## 2022-07-15 NOTE — [place] FL2 (Signed)
[place] MEDICAID FL2 LEVEL OF CARE FORM     IDENTIFICATION  Patient Name: [name] Birthdate: 08-29-1923 Sex: female Admission Date (Current Location): 07/13/2022  [facility] and [place] Number:  Producer, television/film/video and Address:  [facility],  [address], [place] [address]      Provider Number: [id number]  Attending Physician Name and Address:  Standley Brooking, MD  Relative Name and Phone Number:  Achille Rich (daughter) Ph: [phone]    Current Level of Care: Hospital Recommended Level of Care: Skilled Nursing Facility Prior Approval Number:    Date Approved/Denied:   PASRR Number: [id number]  Discharge Plan: SNF    Current Diagnoses: Patient Active Problem List   Diagnosis Date Noted   Acute cystitis without hematuria 07/14/2022   Vertebral compression fracture (HCC) 07/14/2022   Hypertensive urgency 07/14/2022   Thrombocytopenia (HCC) 07/14/2022   Fall 07/14/2022   Pressure injury of skin 07/14/2022   Back pain 07/14/2022   Acute urinary retention 07/14/2022   Compression fracture of L3 vertebra (HCC) 07/14/2022   UTI (urinary tract infection) 10/08/2018   Encounter for Medicare annual wellness exam 06/17/2018   Abdominal pain, acute, left lower quadrant 09/01/2017   Other chest pain 09/01/2017   CKD (chronic kidney disease), symptom management only, stage 3 (moderate) (HCC) 07/03/2017   Cognitive changes 03/05/2017   Healthcare maintenance 03/05/2017   Hypertension 03/05/2017   Cervicalgia 10/10/2016   Lower extremity edema 10/03/2016   Dyspnea on exertion 10/03/2016   Right arm pain 09/04/2016   Migraine 09/04/2016   Numerous moles 09/04/2016   Frequent headaches 09/04/2016   Chronic insomnia 05/11/2015   Vitamin D deficiency 05/17/2014   Counseling regarding end of life decision making 05/03/2014   Dementia (HCC) 05/03/2014   Lumbago 05/11/2012   Vitamin B 12 deficiency 05/11/2012   Dysphagia, pharyngoesophageal  phase 12/17/2010   Esophageal reflux 12/17/2010   Abdominal hernia 12/17/2010   Anemia, iron deficiency 12/06/2010   HYPERCHOLESTEROLEMIA, MILD 11/19/2008   Venous (peripheral) insufficiency 11/14/2007   Essential hypertension, benign 05/14/2007   Arrhythmia 05/14/2007   CONSTIPATION, CHRONIC 05/14/2007   Osteoporosis 05/14/2007   Benign essential tremor 05/14/2007   Anemia 12/18/2006   HIATAL HERNIA 12/18/2006   DEGENERATIVE JOINT DISEASE, GENERALIZED 12/18/2006    Orientation RESPIRATION BLADDER Height & Weight     Self  Normal Incontinent Weight: 114 lb 10.2 oz (52 kg) Height:  5\' 1"  (154.9 cm)  BEHAVIORAL SYMPTOMS/MOOD NEUROLOGICAL BOWEL NUTRITION STATUS     (N/A) Continent Diet (Heart healthy)  AMBULATORY STATUS COMMUNICATION OF NEEDS Skin   Extensive Assist Verbally PU Stage and Appropriate Care, Other (Comment) (Ecchymosis: bilateral arms and hands; Hematoma: left forehead) PU Stage 1 Dressing:  (See discharge summary)                     Personal Care Assistance Level of Assistance  Bathing, Feeding, Dressing Bathing Assistance: Limited assistance Feeding assistance: Independent Dressing Assistance: Limited assistance     Functional Limitations Info  Sight, Hearing, Speech Sight Info: Impaired Hearing Info: Impaired Speech Info: Adequate    SPECIAL CARE FACTORS FREQUENCY  PT (By licensed PT), OT (By licensed OT)     PT Frequency: 5x's/week OT Frequency: 5x's/week            Contractures Contractures Info: Not present    Additional Factors Info  Code Status, Allergies, Psychotropic Code Status Info: DNR Allergies Info: NKA Psychotropic Info: Zoloft  Current Medications (07/15/2022):  This is the current hospital active medication list Current Facility-Administered Medications  Medication Dose Route Frequency Provider Last Rate Last Admin   acetaminophen (TYLENOL) tablet 650 mg  650 mg Oral Q6H PRN John Giovanni, MD   650 mg at  07/15/22 0857   Or   acetaminophen (TYLENOL) suppository 650 mg  650 mg Rectal Q6H PRN John Giovanni, MD       cephALEXin (KEFLEX) capsule 500 mg  500 mg Oral BID Standley Brooking, MD   500 mg at 07/15/22 4540   diltiazem (CARDIZEM SR) 12 hr capsule 90 mg  90 mg Oral Q12H Standley Brooking, MD   90 mg at 07/15/22 1043   hydrALAZINE (APRESOLINE) injection 5 mg  5 mg Intravenous Q6H PRN John Giovanni, MD       methocarbamol (ROBAXIN) tablet 500 mg  500 mg Oral Q8H PRN Standley Brooking, MD       metoprolol succinate (TOPROL-XL) 24 hr tablet 12.5 mg  12.5 mg Oral Daily Standley Brooking, MD   12.5 mg at 07/15/22 0857   sertraline (ZOLOFT) tablet 75 mg  75 mg Oral Daily Standley Brooking, MD   75 mg at 07/15/22 0857   traMADol (ULTRAM) tablet 50 mg  50 mg Oral Q12H PRN Standley Brooking, MD   50 mg at 07/15/22 1318     Discharge Medications: Please see discharge summary for a list of discharge medications.  Relevant Imaging Results:  Relevant Lab Results:   Additional Information SSN: [id number]  Ewing Schlein, LCSW

## 2022-07-15 NOTE — Progress Notes (Signed)
  Progress Note   Patient: [name] ZOX:[id number] DOB: 01/31/1924 DOA: 07/13/2022     1 DOS: the patient was seen and examined on 07/15/2022   Brief hospital course: 89 year old woman presented from memory care unit at [facility] after mechanical fall.  Imaging revealed compression deformities T12 and L3, was admitted for inability to bear weight and pain control.  Assessment and Plan: T12 and L3 compression fractures, indeterminate age Mechanical fall Back pain Plain films showed compression deformities in the superior endplates at T12 and L3, indeterminate in age.   Continue pain management, PT/OT    Abnormal urinalysis, possible UTI Acute urinary retention Urine culture pending.  No fever, leukocytosis, or signs of sepsis.  Continue ceftriaxone.    In-and-out cath as needed.  Avoid Foley catheter if possible.   Spurious anemia Spurious thrombocytopenia Hemoglobin on admission was recorded at 6.5 with a platelet count of 112.  Repeat hemoglobin without any blood products was 12.1 with a platelet count of 216.  This was confirmed.   Hypertensive urgency Resolved.  Continue diltiazem and metoprolol.    QT prolongation, resolved Repeat EKG 5/26 normal sinus rhythm, normal QTc.  Independent interpretation.   Dementia Delirium precautions.  Continue sertraline.       Subjective:  Feels ok Periods of anxiety  Physical Exam: Vitals:   07/14/22 1043 07/14/22 1442 07/14/22 2222 07/15/22 0557  BP: 138/71 127/68 (!) 155/79 137/69  Pulse: 73 82 79 74  Resp: 14 18 17 17   Temp: 98 F (36.7 C) 98.7 F (37.1 C) 98.9 F (37.2 C) 98.1 F (36.7 C)  TempSrc:  Oral Oral Oral  SpO2:  99% 96% 94%  Weight:      Height:       Physical Exam Vitals reviewed.  Constitutional:      General: She is not in acute distress.    Appearance: She is not ill-appearing or toxic-appearing.  Cardiovascular:     Rate and Rhythm: Normal rate and regular rhythm.     Heart sounds: No murmur  heard. Pulmonary:     Effort: Pulmonary effort is normal. No respiratory distress.     Breath sounds: No wheezing, rhonchi or rales.  Musculoskeletal:     Comments: Moves all extremities, follows simple commands  Neurological:     Mental Status: She is alert.  Psychiatric:        Behavior: Behavior normal.     Data Reviewed: No new data Urine culture pending  Family Communication: 2 daughters and one son-in-law at bedside 5/26  Disposition: Status is: Inpatient Remains inpatient appropriate because: further evaluation  Planned Discharge Destination:  return to memory care vs SNF    Time spent: 20 minutes  Author: Brendia Sacks, MD 07/15/2022 8:10 AM  For on call review [email or web address].

## 2022-07-15 NOTE — Plan of Care (Signed)

## 2022-07-16 DIAGNOSIS — S22080A Wedge compression fracture of T11-T12 vertebra, initial encounter for closed fracture: Secondary | ICD-10-CM | POA: Diagnosis not present

## 2022-07-16 DIAGNOSIS — N390 Urinary tract infection, site not specified: Secondary | ICD-10-CM | POA: Diagnosis not present

## 2022-07-16 DIAGNOSIS — R319 Hematuria, unspecified: Secondary | ICD-10-CM | POA: Diagnosis not present

## 2022-07-16 DIAGNOSIS — S32030A Wedge compression fracture of third lumbar vertebra, initial encounter for closed fracture: Secondary | ICD-10-CM | POA: Diagnosis not present

## 2022-07-16 MED ORDER — ORAL CARE MOUTH RINSE: 15.0000 mL | OROMUCOSAL | Status: DC | PRN

## 2022-07-16 MED ORDER — HALOPERIDOL 0.5 MG PO TABS
0.5000 mg | ORAL_TABLET | Freq: Three times a day (TID) | ORAL | Status: DC | PRN
  Administered 2022-07-16 – 2022-07-18 (×2): 0.5 mg via ORAL
  Filled 2022-07-16 (×3): qty 1

## 2022-07-16 NOTE — Progress Notes (Signed)
Put pt on [name] and encouraged pt to pee. Pt kept complaining that she hurt and wanted to lay back down. Pt did not urinate on her own so I&O cath with amber urine drained. Pt tolerated and is resting comfortably in bed.

## 2022-07-16 NOTE — Progress Notes (Signed)
Orthopedic Tech Progress Note Patient Details:  CHYENNE EGY 08/05/1923 [id number]  Patient ID: MARRIANA SHETLEY, female   DOB: December 13, 1923, 89 y.o.   MRN: [id number]  Kizzie Fantasia 07/16/2022, 11:43 AM TLSO placed in room for use when OOB

## 2022-07-16 NOTE — TOC Progression Note (Signed)
Transition of Care [facility]) - Progression Note   Patient Details  Name: LEGEND CARBONARO MRN: [id number] Date of Birth: [birth year]  [facility] CM/SW Contact  Ewing Schlein, LCSW Phone Number: 07/16/2022, 2:38 PM  Clinical Narrative: Daughters' first choices for SNF are [facility] and [facility]. CSW followed up with admissions at both facilities to request the referral be reviewed. CSW provided list of confirmed bed offers to daughters for review.  Expected Discharge Plan: Skilled Nursing Facility Barriers to Discharge: Insurance Authorization, SNF Pending bed offer, Continued Medical Work up  Expected Discharge Plan and Services In-house Referral: Clinical Social Work Post Acute Care Choice: Skilled Nursing Facility Living arrangements for the past 2 months: Assisted Living Facility            DME Arranged: N/A DME Agency: NA  Social Determinants of Health (SDOH) Interventions SDOH Screenings   Food Insecurity: Patient Unable To Answer (07/14/2022)  Housing: High Risk (07/14/2022)  Transportation Needs: Patient Unable To Answer (07/14/2022)  Utilities: Patient Unable To Answer (07/14/2022)  Depression (PHQ2-9): Low Risk  (03/22/2020)  Tobacco Use: Low Risk  (07/13/2022)   Readmission Risk Interventions     No data to display

## 2022-07-16 NOTE — Progress Notes (Signed)
  Progress Note   Patient: [name]:[id number] DOB: [birth year] DOA: 07/13/2022     2 DOS: the patient was seen and examined on 07/16/2022   Brief hospital course: 89 year old woman presented from memory care unit at [facility] after mechanical fall.  Imaging revealed compression deformities T12 and L3, was admitted for inability to bear weight and pain control.  Condition gradually improved.  Was able to walk today with therapy.  Plan for SNF.  Medically clear.  Assessment and Plan: T12 and L3 compression fractures, indeterminate age Mechanical fall Back pain Plain films showed compression deformities in the superior endplates at T12 and L3, indeterminate in age.   Continue pain management, PT/OT. TSLO brace. Plan for SNF   Abnormal urinalysis, Klebsiella UTI UTI Acute urinary retention Urine culture Klebsiella, sensitivities pending.  No fever, leukocytosis, or signs of sepsis.  Continue ceftriaxone.    In-and-out cath as needed.  Avoid Foley catheter if possible.  Up to toilet.   Spurious anemia Spurious thrombocytopenia Hemoglobin on admission was recorded at 6.5 with a platelet count of 112.  Repeat hemoglobin without any blood products was 12.1 with a platelet count of 216.  This was confirmed.   Hypertensive urgency Resolved.  Continue diltiazem and metoprolol.    QT prolongation, resolved Repeat EKG 5/26 normal sinus rhythm, normal QTc.  Independent interpretation.   Dementia Delirium precautions.  Continue sertraline.      Subjective:  Feels ok Confused/dementia makes history unreliable  Physical Exam: Vitals:   07/15/22 1325 07/15/22 2009 07/16/22 0517 07/16/22 1326  BP: (!) 144/105 (!) 163/81 (!) 165/63 (!) 141/82  Pulse: 69 66 64 66  Resp: 16 17 18 18   Temp: 98.3 F (36.8 C) 98 F (36.7 C) 97.6 F (36.4 C) 97.7 F (36.5 C)  TempSrc: Oral Oral Oral Oral  SpO2: 96% 95% 95% 95%  Weight:      Height:       Physical Exam Vitals reviewed.   Constitutional:      General: She is not in acute distress.    Appearance: She is not ill-appearing or toxic-appearing.  HENT:     Ears:     Comments: Hard of hearing Cardiovascular:     Rate and Rhythm: Normal rate and regular rhythm.     Heart sounds: No murmur heard. Pulmonary:     Effort: Pulmonary effort is normal. No respiratory distress.     Breath sounds: No wheezing, rhonchi or rales.  Musculoskeletal:     Right lower leg: No edema.     Left lower leg: No edema.     Comments: Sits up in bed easily. No pain with palpation of thoracic or lumbar spine. Lifts both legs off the bed. Good LE strength.  Neurological:     Mental Status: She is alert.  Psychiatric:        Mood and Affect: Mood normal.        Behavior: Behavior normal.    Data Reviewed: No new data  Family Communication: 2 daughters, 1 son-in-law at bedside  Disposition: Status is: Inpatient Remains inpatient appropriate because: needs SNF  Planned Discharge Destination: Skilled nursing facility    Time spent: 20 minutes  Author: Brendia Sacks, MD 07/16/2022 3:29 PM  For on call review [email or web address].

## 2022-07-16 NOTE — Progress Notes (Signed)
Physical Therapy Treatment Patient Details Name: [name] MRN: [id number] DOB: 03/26/1923 Today's Date: 07/16/2022   History of Present Illness Patient is a 89 year old female who presented after a mechainical fall at memory care facility. Patient was found to have T12 and L3 compression deformities, back pain, possible UTI, acute urinary retention, and spurious anemia. PMH: dementia, macular hole L eye, anxiety, HTN    PT Comments    Good progress this session once pt over initial  incr in pain level; pt allowed therapist to don TLSO while on EOB; with coaxing from PT and pt family pt was able to amb hallway distance and tol well; performed stand step pivot transfer to [facility] with min assist (to attempt voiding at RN request-pt unable);  d/c plan remains appropriate.    Recommendations for follow up therapy are one component of a multi-disciplinary discharge planning process, led by the attending physician.  Recommendations may be updated based on patient status, additional functional criteria and insurance authorization.  Follow Up Recommendations  Can patient physically be transported by private vehicle: No    Assistance Recommended at Discharge Intermittent Supervision/Assistance  Patient can return home with the following Assistance with cooking/housework;Assist for transportation;Help with stairs or ramp for entrance;Direct supervision/assist for medications management;Assistance with feeding;Direct supervision/assist for financial management;A lot of help with walking and/or transfers;A lot of help with bathing/dressing/bathroom   Equipment Recommendations  None recommended by PT    Recommendations for Other Services       Precautions / Restrictions Precautions Precautions: Back;Fall Precaution Comments: back prec for comfort Required Braces or Orthoses: Spinal Brace Spinal Brace: Thoracolumbosacral orthotic (applied position not specified) Restrictions Weight Bearing  Restrictions: No     Mobility  Bed Mobility Overal bed mobility: Needs Assistance Bed Mobility: Supine to Sit     Supine to sit: Min assist, +2 for physical assistance, +2 for safety/equipment     General bed mobility comments: attempted rolling however pt screaming in pain; assist to progress LEs off bed and elevate trunk    Transfers Overall transfer level: Needs assistance Equipment used: Rolling walker (2 wheels) Transfers: Sit to/from Stand, Bed to chair/wheelchair/BSC Sit to Stand: Min assist   Step pivot transfers: Min assist       General transfer comment: assist to rise and stabilize, cues for hand placement; TLSO applied in sitting    Ambulation/Gait Ambulation/Gait assistance: Min assist, +2 safety/equipment Gait Distance (Feet): 60 Feet Assistive device: Rolling walker (2 wheels) Gait Pattern/deviations: Step-through pattern       General Gait Details: assist to steady   Stairs             Wheelchair Mobility    Modified Rankin (Stroke Patients Only)       Balance Overall balance assessment: History of Falls, Needs assistance Sitting-balance support: Feet supported, No upper extremity supported Sitting balance-Leahy Scale: Fair Sitting balance - Comments: able to maintain static sitting with close supervision   Standing balance support: During functional activity, Reliant on assistive device for balance Standing balance-Leahy Scale: Poor                              Cognition Arousal/Alertness: Awake/alert Behavior During Therapy: Restless, Flat affect Overall Cognitive Status: History of cognitive impairments - at baseline  General Comments: patients daughters present during session        Exercises      General Comments        Pertinent Vitals/Pain Pain Assessment Pain Assessment: Faces Faces Pain Scale: Hurts worst Pain Location: back Pain Descriptors /  Indicators: Crying, Grimacing, Moaning Pain Intervention(s): Limited activity within patient's tolerance, Monitored during session, Premedicated before session, Repositioned    Home Living                          Prior Function            PT Goals (current goals can now be found in the care plan section) Acute Rehab PT Goals PT Goal Formulation: Patient unable to participate in goal setting Time For Goal Achievement: 07/29/22 Potential to Achieve Goals: Fair Progress towards PT goals: Progressing toward goals    Frequency    Min 1X/week      PT Plan Current plan remains appropriate    Co-evaluation              AM-PAC PT "6 Clicks" Mobility   Outcome Measure  Help needed turning from your back to your side while in a flat bed without using bedrails?: A Little Help needed moving from lying on your back to sitting on the side of a flat bed without using bedrails?: A Little Help needed moving to and from a bed to a chair (including a wheelchair)?: A Little Help needed standing up from a chair using your arms (e.g., wheelchair or bedside chair)?: A Little Help needed to walk in hospital room?: A Little Help needed climbing 3-5 steps with a railing? : A Lot 6 Click Score: 17    End of Session Equipment Utilized During Treatment: Back brace;Gait belt Activity Tolerance: Patient tolerated treatment well Patient left: in chair;with call bell/phone within reach;with chair alarm set;with family/visitor present Nurse Communication: Mobility status PT Visit Diagnosis: Other abnormalities of gait and mobility (R26.89)     Time: 1610-9604 PT Time Calculation (min) (ACUTE ONLY): 21 min  Charges:  $Gait Training: 8-22 mins                     Delice Bison, PT  Acute Rehab Dept ([facility]) [phone]  07/16/2022    [facility] 07/16/2022, 2:30 PM

## 2022-07-16 NOTE — Progress Notes (Addendum)
Occupational Therapy Treatment Patient Details Name: [name] MRN: [id number] DOB: 1923/09/10 Today's Date: 07/16/2022   History of present illness Patient is a 89 year old female who presented after a mechainical fall at memory care facility. Patient was found to have T12 and L3 compression deformities, back pain, possible UTI, acute urinary retention, and spurious anemia. PMH: dementia,   OT comments  Patient was noted to have continued increased pain at start of session limiting participation. Patient's nurse made aware. Patients daughter and NT educated on pillow placement to reduce pressure on back in bed. Patients daughter inquired about brace for patients back. No orders in chart for back brace at this time. Patients daughter was educated on back precautions being used when staff is working with patient. Nurse made aware of patients daughters inquiry on back brace. Patient's discharge plan remains appropriate at this time. OT will continue to follow acutely.     Recommendations for follow up therapy are one component of a multi-disciplinary discharge planning process, led by the attending physician.  Recommendations may be updated based on patient status, additional functional criteria and insurance authorization.    Assistance Recommended at Discharge Frequent or constant Supervision/Assistance  Patient can return home with the following  Two people to help with walking and/or transfers;A lot of help with bathing/dressing/bathroom;Assistance with cooking/housework;Direct supervision/assist for medications management;Assist for transportation;Help with stairs or ramp for entrance;Direct supervision/assist for financial management;Assistance with feeding   Equipment Recommendations  None recommended by OT       Precautions / Restrictions Precautions Precautions: Back;Fall Precaution Comments: back prec for comfort Restrictions Weight Bearing Restrictions: No       Mobility  Bed Mobility Overal bed mobility: Needs Assistance Bed Mobility: Rolling Rolling: Min assist         General bed mobility comments: patient declined to attempt to get out of bed when pain kicked in after initally begging to get out of bed at start of session.           ADL either performed or assessed with clinical judgement   ADL Overall ADL's : Needs assistance/impaired       General ADL Comments: patient lying supine in bed reporting eager to get out of bed. patient was in A to roll to R side with patient calling out in pain and grabbing L side. patients nurse consuted on pain medications again with nurse reporting patient had muscle relaxers on board at this time. patient's daughter in room inquiring about SNF and transition. daughter was educated on process and that bed offers would be coming from the case manager. patients daughter verbalized understanding. patient positioned in supine with pillows to support proper body mechanics and scooted up in bed.      Cognition Arousal/Alertness: Awake/alert Behavior During Therapy: Flat affect, Restless Overall Cognitive Status: History of cognitive impairments - at baseline         General Comments: patients other daughter was present during session                   Pertinent Vitals/ Pain       Pain Assessment Pain Assessment: Faces Faces Pain Scale: Hurts worst Pain Location: back Pain Descriptors / Indicators: Crying, Grimacing, Moaning Pain Intervention(s): Limited activity within patient's tolerance, Monitored during session, Patient requesting pain meds-RN notified, Repositioned         Frequency  Min 2X/week        Progress Toward Goals  OT Goals(current goals can now be  found in the care plan section)  Progress towards OT goals: Not progressing toward goals - comment (pain is severly limiting patients ability to engage in time out of bed.)     Plan Discharge plan remains appropriate        AM-PAC OT "6 Clicks" Daily Activity     Outcome Measure   Help from another person eating meals?: Total Help from another person taking care of personal grooming?: Total Help from another person toileting, which includes using toliet, bedpan, or urinal?: Total Help from another person bathing (including washing, rinsing, drying)?: Total Help from another person to put on and taking off regular upper body clothing?: Total Help from another person to put on and taking off regular lower body clothing?: Total 6 Click Score: 6    End of Session    OT Visit Diagnosis: Pain;History of falling (Z91.81)   Activity Tolerance Patient limited by pain;Patient limited by fatigue;Patient limited by lethargy   Patient Left in bed;with call bell/phone within reach;with bed alarm set;with family/visitor present   Nurse Communication Patient requests pain meds        Time: 0822-0838 OT Time Calculation (min): 16 min  Charges: OT General Charges $OT Visit: 1 Visit OT Treatments $Therapeutic Activity: 8-22 mins  Rosalio Loud, MS Acute Rehabilitation Department Office# [phone]   Selinda Flavin 07/16/2022, 10:45 AM

## 2022-07-16 NOTE — Progress Notes (Signed)
The patient has not urinated all shift and her bladder scan is344. She has had an I&O cath >3 times. Messaged Chinita Greenland to ask if an I&O cath or placing a foley is warranted.

## 2022-07-17 DIAGNOSIS — R338 Other retention of urine: Secondary | ICD-10-CM | POA: Diagnosis not present

## 2022-07-17 DIAGNOSIS — S22080A Wedge compression fracture of T11-T12 vertebra, initial encounter for closed fracture: Secondary | ICD-10-CM | POA: Diagnosis not present

## 2022-07-17 DIAGNOSIS — L8990 Pressure ulcer of unspecified site, unspecified stage: Secondary | ICD-10-CM

## 2022-07-17 DIAGNOSIS — D509 Iron deficiency anemia, unspecified: Secondary | ICD-10-CM

## 2022-07-17 DIAGNOSIS — D649 Anemia, unspecified: Secondary | ICD-10-CM | POA: Diagnosis not present

## 2022-07-17 DIAGNOSIS — W19XXXA Unspecified fall, initial encounter: Secondary | ICD-10-CM | POA: Diagnosis not present

## 2022-07-17 DIAGNOSIS — F0390 Unspecified dementia without behavioral disturbance: Secondary | ICD-10-CM

## 2022-07-17 DIAGNOSIS — M545 Low back pain, unspecified: Secondary | ICD-10-CM

## 2022-07-17 DIAGNOSIS — D696 Thrombocytopenia, unspecified: Secondary | ICD-10-CM

## 2022-07-17 DIAGNOSIS — I160 Hypertensive urgency: Secondary | ICD-10-CM

## 2022-07-17 MED ORDER — SODIUM CHLORIDE 0.9 % IV BOLUS: 500.0000 mL | Freq: Once | INTRAVENOUS | Status: DC

## 2022-07-17 MED ORDER — HYDROCODONE-ACETAMINOPHEN 5-325 MG PO TABS
1.0000 | ORAL_TABLET | ORAL | Status: DC | PRN
  Administered 2022-07-17 – 2022-07-18 (×8): 1 via ORAL
  Administered 2022-07-19: 2 via ORAL
  Filled 2022-07-17 (×3): qty 1
  Filled 2022-07-17: qty 2
  Filled 2022-07-17 (×3): qty 1
  Filled 2022-07-17: qty 2
  Filled 2022-07-17: qty 1

## 2022-07-17 NOTE — TOC Progression Note (Signed)
Transition of Care [facility]) - Progression Note    Patient Details  Name: [name] MRN: [id number] Date of Birth: 08-Mar-1923  Transition of Care [facility] CM/SW Contact  Amada Jupiter, LCSW Phone Number: 07/17/2022, 1:17 PM  Clinical Narrative:     Have reviewed SNF bed offers with pt's daughters and they have accepted bed at [facility].  Have begun insurance authorization.  Expected Discharge Plan: Skilled Nursing Facility Barriers to Discharge: Insurance Authorization, SNF Pending bed offer, Continued Medical Work up  Expected Discharge Plan and Services In-house Referral: Clinical Social Work   Post Acute Care Choice: Skilled Nursing Facility Living arrangements for the past 2 months: Assisted Living Facility                 DME Arranged: N/A DME Agency: NA                   Social Determinants of Health (SDOH) Interventions SDOH Screenings   Food Insecurity: Patient Unable To Answer (07/14/2022)  Housing: High Risk (07/14/2022)  Transportation Needs: Patient Unable To Answer (07/14/2022)  Utilities: Patient Unable To Answer (07/14/2022)  Depression (PHQ2-9): Low Risk  (03/22/2020)  Tobacco Use: Low Risk  (07/13/2022)    Readmission Risk Interventions     No data to display

## 2022-07-17 NOTE — Progress Notes (Signed)
Patient agrees to ambulate to bathroom to attempt to void, when asked if she felt the urge patient stated "yes." Patient sat up to side of bed and stood w/ min assist. Held onto staff hands to ambulate to BRP. Patient sat on toilet x5 minutes but once sitting denied feeling urge to void. Patient also kept asking "Am I supposed to be doing something? What am I doing in here?" Patient was assisted back to bed. Bladder scan read . Notified Dr Natale Milch. Will cont to encourage po intake and fluids. Family educated as well. Will cont to monitor.

## 2022-07-17 NOTE — Progress Notes (Signed)
PROGRESS NOTE    [name]  ZOX:[id number] DOB: [birth year] DOA: 07/13/2022 PCP: Pcp, No   Brief Narrative:  89 year old woman presented from memory care unit at [facility] after mechanical fall. Imaging revealed compression deformities T12 and L3, was admitted for inability to bear weight and pain control. Condition gradually improved, and patient was able to walk 5/28 with therapy; unfortunately pain continues to be extremely poorly controlled today requiring increased regimen.  Patient essentially bedbound currently in moderate distress -complicated by dementia.   Assessment & Plan:   Principal Problem:   Vertebral compression fracture (HCC) Active Problems:   Anemia   Dementia (HCC)   UTI (urinary tract infection)   Acute cystitis without hematuria   Hypertensive urgency   Thrombocytopenia (HCC)   Fall   Pressure injury of skin   Back pain   Acute urinary retention   Compression fracture of L3 vertebra (HCC)   Goals of care  -Healthy discussion at bedside this morning with daughter and son-in-law -Patient continues to have poorly controlled pain, agitated, exacerbated by confusion and baseline dementia. -Discussed goals of care, patient is DNR and would not want "anything else done" per daughter.  We discussed potentially moving forward with palliative care given patient's current poorly controlled pain, bedbound status and quality of life.  T12 and L3 compression fractures, indeterminate age Mechanical fall Intractable back pain, ambulatory dysfunction Plain films showed compression deformities in the superior endplates at T12 and L3, indeterminate in age.   Continue pain management, PT/OT. TSLO brace. Plan for SNF once pain is more appropriately managed   Abnormal urinalysis, Klebsiella UTI UTI Acute urinary retention Urine culture Klebsiella, sensitivities pending.  No fever, leukocytosis, or signs of sepsis.  Completed ceftriaxone.    In-and-out cath as needed.   Avoid Foley catheter if possible.  Up to toilet.   Spurious anemia Spurious thrombocytopenia Hemoglobin on admission was recorded at 6.5 with a platelet count of 112.  Repeat hemoglobin without any blood products was 12.1 with a platelet count of 216.  This was confirmed.   Hypertensive urgency Resolved.  Continue diltiazem and metoprolol.    QT prolongation, resolved Repeat EKG 5/26 normal sinus rhythm, normal QTc.  Independent interpretation.   Dementia Delirium precautions.  Continue sertraline.   DVT prophylaxis: SCDs, early ambulation Code Status: DNR Family Communication: At bedside  Status is: Inpatient  Dispo: The patient is from: Memory care              Anticipated d/c is to: Same              Anticipated d/c date is: 24 to 48 hours              Patient currently not medically stable for discharge  Consultants:  Palliative care  Procedures:  None  Antimicrobials:  Ceftriaxone x 3 days, completed  Subjective: No acute issues or events overnight, patient somewhat agitated in the evening but received Haldol with appropriate response slept well overnight, this morning patient is somewhat hysterical in the setting of pain, difficult to reorient review of systems markedly limited in the setting of dementia  Objective: Vitals:   07/16/22 0517 07/16/22 1326 07/16/22 2206 07/17/22 0640  BP: (!) 165/63 (!) 141/82 (!) 174/84 (!) 125/95  Pulse: 64 66 73 (!) 103  Resp: 18 18 18 14   Temp: 97.6 F (36.4 C) 97.7 F (36.5 C) (!) 97 F (36.1 C) 97.9 F (36.6 C)  TempSrc: Oral Oral Axillary Oral  SpO2:  95% 95% 93% (!) 79%  Weight:      Height:        Intake/Output Summary (Last 24 hours) at 07/17/2022 1120 Last data filed at 07/17/2022 0941 Gross per 24 hour  Intake 280 ml  Output 250 ml  Net 30 ml   Filed Weights   07/14/22 0420  Weight: 52 kg    Examination:  General: Moderate distress calling out in pain but unable to localize or qualify pain. HEENT:   Normocephalic atraumatic.  Sclerae nonicteric, noninjected.  Extraocular movements intact bilaterally. Neck:  Without mass or deformity.  Trachea is midline. Lungs:  Clear to auscultate bilaterally without rhonchi, wheeze, or rales. Heart:  Regular rate and rhythm.  Without murmurs, rubs, or gallops. Abdomen:  Soft, nontender, nondistended.  Without guarding or rebound. Extremities: Without cyanosis, clubbing, edema, or obvious deformity. Vascular:  Dorsalis pedis and posterior tibial pulses palpable bilaterally. Skin:  Warm and dry, no erythema, no ulcerations.  Data Reviewed: I have personally reviewed following labs and imaging studies  CBC: Recent Labs  Lab 07/14/22 0122 07/14/22 0758 07/14/22 1103  WBC 6.5 12.8* 15.1*  NEUTROABS 5.3  --   --   HGB 6.5* 12.1 13.7  HCT 19.9* 35.3* 43.2  MCV 99.0 95.9 101.4*  PLT 112* 216 181   Basic Metabolic Panel: Recent Labs  Lab 07/14/22 0122 07/14/22 0758  NA 138 134*  K 3.5 3.0*  CL 102 100  CO2 25 25  GLUCOSE 134* 126*  BUN 17 14  CREATININE 0.56 0.53  CALCIUM 9.2 8.9  MG  --  1.9   GFR: Estimated Creatinine Clearance: 31 mL/min (by C-G formula based on SCr of 0.53 mg/dL). Liver Function Tests: No results for input(s): "AST", "ALT", "ALKPHOS", "BILITOT", "PROT", "ALBUMIN" in the last 168 hours. No results for input(s): "LIPASE", "AMYLASE" in the last 168 hours. No results for input(s): "AMMONIA" in the last 168 hours. Coagulation Profile: No results for input(s): "INR", "PROTIME" in the last 168 hours. Cardiac Enzymes: No results for input(s): "CKTOTAL", "CKMB", "CKMBINDEX", "TROPONINI" in the last 168 hours. BNP (last 3 results) No results for input(s): "PROBNP" in the last 8760 hours. HbA1C: No results for input(s): "HGBA1C" in the last 72 hours. CBG: No results for input(s): "GLUCAP" in the last 168 hours. Lipid Profile: No results for input(s): "CHOL", "HDL", "LDLCALC", "TRIG", "CHOLHDL", "LDLDIRECT" in the last  72 hours. Thyroid Function Tests: No results for input(s): "TSH", "T4TOTAL", "FREET4", "T3FREE", "THYROIDAB" in the last 72 hours. Anemia Panel: No results for input(s): "VITAMINB12", "FOLATE", "FERRITIN", "TIBC", "IRON", "RETICCTPCT" in the last 72 hours. Sepsis Labs: No results for input(s): "PROCALCITON", "LATICACIDVEN" in the last 168 hours.  Recent Results (from the past 240 hour(s))  Urine Culture     Status: Abnormal (Preliminary result)   Collection Time: 07/14/22  1:22 AM   Specimen: Urine, Clean Catch  Result Value Ref Range Status   Specimen Description   Final    URINE, CLEAN CATCH Performed at [facility], [address] [place], [place] [address]    Special Requests   Final    NONE Performed at [facility], [address], [place], [place] [address]    Culture (A)  Final    >=100,000 COLONIES/mL KLEBSIELLA PNEUMONIAE SUSCEPTIBILITIES TO FOLLOW Performed at [facility], [address], [place], [place] [address]    Report Status PENDING  Incomplete  MRSA Next Gen by PCR, Nasal     Status: None  Collection Time: 07/14/22  6:16 AM   Specimen: Nasal Mucosa; Nasal Swab  Result Value Ref Range Status   MRSA by PCR Next Gen NOT DETECTED NOT DETECTED Final    Comment: (NOTE) The GeneXpert MRSA Assay (FDA approved for NASAL specimens only), is one component of a comprehensive MRSA colonization surveillance program. It is not intended to diagnose MRSA infection nor to guide or monitor treatment for MRSA infections. Test performance is not FDA approved in patients less than 29 years old. Performed at [facility], [address] [place], [place] [address]    Radiology Studies: No results found.  Scheduled Meds:  diltiazem  90 mg Oral Q12H   metoprolol succinate  12.5 mg Oral Daily   sertraline  75 mg Oral Daily   Continuous Infusions:   LOS: 3 days   Time spent:  Azucena Fallen, DO [facility]  If 7PM-7AM, please contact night-coverage [email or web address]  07/17/2022, 11:20 AM

## 2022-07-17 NOTE — Care Management Important Message (Signed)
Important Message  Patient Details IM Letter given. Name: [name] MRN: [id number] Date of Birth: 1923/06/26   Medicare Important Message Given:  Yes     Caren Macadam 07/17/2022, 3:18 PM

## 2022-07-18 DIAGNOSIS — Z515 Encounter for palliative care: Secondary | ICD-10-CM | POA: Diagnosis not present

## 2022-07-18 DIAGNOSIS — R338 Other retention of urine: Secondary | ICD-10-CM | POA: Diagnosis not present

## 2022-07-18 DIAGNOSIS — S22080A Wedge compression fracture of T11-T12 vertebra, initial encounter for closed fracture: Secondary | ICD-10-CM | POA: Diagnosis not present

## 2022-07-18 DIAGNOSIS — D509 Iron deficiency anemia, unspecified: Secondary | ICD-10-CM | POA: Diagnosis not present

## 2022-07-18 DIAGNOSIS — N3000 Acute cystitis without hematuria: Secondary | ICD-10-CM | POA: Diagnosis not present

## 2022-07-18 DIAGNOSIS — W19XXXA Unspecified fall, initial encounter: Secondary | ICD-10-CM | POA: Diagnosis not present

## 2022-07-18 LAB — URINE CULTURE: Culture: >=100000 — AB

## 2022-07-18 MED ORDER — SODIUM CHLORIDE 0.9 % IV BOLUS
1000.0000 mL | Freq: Once | INTRAVENOUS | Status: AC
  Administered 2022-07-18: 1000 mL via INTRAVENOUS

## 2022-07-18 MED ORDER — CHLORHEXIDINE GLUCONATE CLOTH 2 % EX PADS
6.0000 | MEDICATED_PAD | Freq: Every day | CUTANEOUS | Status: DC
  Administered 2022-07-18 – 2022-07-19 (×2): 6 via TOPICAL

## 2022-07-18 NOTE — Progress Notes (Signed)
PROGRESS NOTE    [name]  WUJ:[id number] DOB: [birth year] DOA: 07/13/2022 PCP: Pcp, No   Brief Narrative:  89 year old woman presented from memory care unit at [facility] after mechanical fall. Imaging revealed compression deformities T12 and L3, was admitted for inability to bear weight and pain control. Condition gradually improved, and patient was able to walk 5/28 with therapy; pain currently well-controlled on new medication hydrocodone but urinary obstruction versus retention continues.  Given inability to void we will continue to monitor overnight, potential to place Foley catheter in the next 24 hours but hoping to avoid given patient's mental status and high risk for dislodging the Foley on her own and her confusion.   Assessment & Plan:   Principal Problem:   Vertebral compression fracture (HCC) Active Problems:   Anemia   Dementia (HCC)   UTI (urinary tract infection)   Acute cystitis without hematuria   Hypertensive urgency   Thrombocytopenia (HCC)   Fall   Pressure injury of skin   Back pain   Acute urinary retention   Compression fracture of L3 vertebra (HCC)  Goals of care  -Healthy discussion at bedside this morning with daughter and son-in-law -Patient continues to have poorly controlled pain, agitated, exacerbated by confusion and baseline dementia. -Discussed goals of care, patient is DNR and would not want "anything else done" per daughter.  We discussed potentially moving forward with palliative care given patient's current poorly controlled pain, bedbound status and quality of life.  T12 and L3 compression fractures, indeterminate age Mechanical fall Intractable back pain, ambulatory dysfunction Plain films showed compression deformities in the superior endplates at T12 and L3, indeterminate in age.   Continue pain management, PT/OT. TSLO brace. Plan for SNF once pain is more appropriately managed   Abnormal urinalysis, Klebsiella UTI UTI Acute  urinary retention Urine culture Klebsiella, sensitivities pending.  No fever, leukocytosis, or signs of sepsis.  Completed ceftriaxone.    In-and-out cath as needed.  Avoid Foley catheter if possible.  **Patient continues to have difficulty voiding, very poor p.o. intake so urine output is low today, IV fluids x 1 L   Spurious anemia Spurious thrombocytopenia Hemoglobin on admission was recorded at 6.5 with a platelet count of 112.  Repeat hemoglobin without any blood products was 12.1 with a platelet count of 216.  This was confirmed.   Hypertensive urgency Resolved.  Continue diltiazem and metoprolol.    QT prolongation, resolved Repeat EKG 5/26 normal sinus rhythm, normal QTc.  Independent interpretation.   Dementia Delirium precautions.  Continue sertraline.   DVT prophylaxis: SCDs, early ambulation Code Status: DNR Family Communication: At bedside  Status is: Inpatient  Dispo: The patient is from: Memory care              Anticipated d/c is to: Same              Anticipated d/c date is: 24 to 48 hours              Patient currently not medically stable for discharge  Consultants:  Palliative care  Procedures:  None  Antimicrobials:  Ceftriaxone x 3 days, completed  Subjective: No acute issues or events overnight, patient somewhat agitated in the evening but received Haldol with appropriate response slept well overnight, this morning patient is somewhat hysterical in the setting of pain, difficult to reorient review of systems markedly limited in the setting of dementia  Objective: Vitals:   07/17/22 1347 07/17/22 2121 07/18/22 0508 07/18/22 1412  BP: 127/68 (!) 162/69 (!) 146/67 127/63  Pulse: 68 65 71 73  Resp: 17 14 13 16   Temp: 97.7 F (36.5 C) 97.7 F (36.5 C) 97.6 F (36.4 C) 98.1 F (36.7 C)  TempSrc: Oral Oral Axillary   SpO2: 94% 92% 99% 97%  Weight:      Height:        Intake/Output Summary (Last 24 hours) at 07/18/2022 1450 Last data filed at  07/18/2022 1100 Gross per 24 hour  Intake 268.82 ml  Output 550 ml  Net -281.18 ml    Filed Weights   07/14/22 0420  Weight: 52 kg    Examination:  General: Moderate distress calling out in pain but unable to localize or qualify pain. HEENT:  Normocephalic atraumatic.  Sclerae nonicteric, noninjected.  Extraocular movements intact bilaterally. Neck:  Without mass or deformity.  Trachea is midline. Lungs:  Clear to auscultate bilaterally without rhonchi, wheeze, or rales. Heart:  Regular rate and rhythm.  Without murmurs, rubs, or gallops. Abdomen:  Soft, nontender, nondistended.  Without guarding or rebound. Extremities: Without cyanosis, clubbing, edema, or obvious deformity. Vascular:  Dorsalis pedis and posterior tibial pulses palpable bilaterally. Skin:  Warm and dry, no erythema, no ulcerations.  Data Reviewed: I have personally reviewed following labs and imaging studies  CBC: Recent Labs  Lab 07/14/22 0122 07/14/22 0758 07/14/22 1103  WBC 6.5 12.8* 15.1*  NEUTROABS 5.3  --   --   HGB 6.5* 12.1 13.7  HCT 19.9* 35.3* 43.2  MCV 99.0 95.9 101.4*  PLT 112* 216 181    Basic Metabolic Panel: Recent Labs  Lab 07/14/22 0122 07/14/22 0758  NA 138 134*  K 3.5 3.0*  CL 102 100  CO2 25 25  GLUCOSE 134* 126*  BUN 17 14  CREATININE 0.56 0.53  CALCIUM 9.2 8.9  MG  --  1.9    GFR: Estimated Creatinine Clearance: 31 mL/min (by C-G formula based on SCr of 0.53 mg/dL). Liver Function Tests: No results for input(s): "AST", "ALT", "ALKPHOS", "BILITOT", "PROT", "ALBUMIN" in the last 168 hours. No results for input(s): "LIPASE", "AMYLASE" in the last 168 hours. No results for input(s): "AMMONIA" in the last 168 hours. Coagulation Profile: No results for input(s): "INR", "PROTIME" in the last 168 hours. Cardiac Enzymes: No results for input(s): "CKTOTAL", "CKMB", "CKMBINDEX", "TROPONINI" in the last 168 hours. BNP (last 3 results) No results for input(s): "PROBNP" in  the last 8760 hours. HbA1C: No results for input(s): "HGBA1C" in the last 72 hours. CBG: No results for input(s): "GLUCAP" in the last 168 hours. Lipid Profile: No results for input(s): "CHOL", "HDL", "LDLCALC", "TRIG", "CHOLHDL", "LDLDIRECT" in the last 72 hours. Thyroid Function Tests: No results for input(s): "TSH", "T4TOTAL", "FREET4", "T3FREE", "THYROIDAB" in the last 72 hours. Anemia Panel: No results for input(s): "VITAMINB12", "FOLATE", "FERRITIN", "TIBC", "IRON", "RETICCTPCT" in the last 72 hours. Sepsis Labs: No results for input(s): "PROCALCITON", "LATICACIDVEN" in the last 168 hours.  Recent Results (from the past 240 hour(s))  Urine Culture     Status: Abnormal   Collection Time: 07/14/22  1:22 AM   Specimen: Urine, Clean Catch  Result Value Ref Range Status   Specimen Description   Final    URINE, CLEAN CATCH Performed at [facility], [address], [place], [place] [address]    Special Requests   Final    NONE Performed at [facility], [address] [place], [place] [address]    Culture (A)  Final    >=  100,000 COLONIES/mL KLEBSIELLA PNEUMONIAE Two isolates with different morphologies were identified as the same organism.The most resistant organism was reported. Performed at [facility], [address], [place], [place] [address]    Report Status 07/18/2022 FINAL  Final   Organism ID, Bacteria KLEBSIELLA PNEUMONIAE (A)  Final      Susceptibility   Klebsiella pneumoniae - MIC*    AMPICILLIN >=32 RESISTANT Resistant     CEFAZOLIN <=4 SENSITIVE Sensitive     CEFEPIME <=0.12 SENSITIVE Sensitive     CEFTRIAXONE <=0.25 SENSITIVE Sensitive     CIPROFLOXACIN <=0.25 SENSITIVE Sensitive     GENTAMICIN <=1 SENSITIVE Sensitive     IMIPENEM 2 SENSITIVE Sensitive     NITROFURANTOIN 128 RESISTANT Resistant     TRIMETH/SULFA <=20 SENSITIVE Sensitive     AMPICILLIN/SULBACTAM 8 SENSITIVE Sensitive     PIP/TAZO 8 SENSITIVE  Sensitive     * >=100,000 COLONIES/mL KLEBSIELLA PNEUMONIAE  MRSA Next Gen by PCR, Nasal     Status: None   Collection Time: 07/14/22  6:16 AM   Specimen: Nasal Mucosa; Nasal Swab  Result Value Ref Range Status   MRSA by PCR Next Gen NOT DETECTED NOT DETECTED Final    Comment: (NOTE) The GeneXpert MRSA Assay (FDA approved for NASAL specimens only), is one component of a comprehensive MRSA colonization surveillance program. It is not intended to diagnose MRSA infection nor to guide or monitor treatment for MRSA infections. Test performance is not FDA approved in patients less than 11 years old. Performed at [facility], [address], [place], [place] [address]    Radiology Studies: No results found.  Scheduled Meds:  diltiazem  90 mg Oral Q12H   metoprolol succinate  12.5 mg Oral Daily   sertraline  75 mg Oral Daily   Continuous Infusions:   LOS: 4 days   Time spent:  Azucena Fallen, DO [facility]  If 7PM-7AM, please contact night-coverage [email or web address]  07/18/2022, 2:50 PM

## 2022-07-18 NOTE — Consult Note (Signed)
Palliative Medicine Inpatient Consult Note  Consulting Provider: Dr. Natale Milch  Reason for consult:   Palliative Care Consult Services Palliative Medicine Consult  Reason for Consult? Pain management, quality of life   07/18/2022  HPI:  Per intake H&P --> 89 year old woman presented from memory care unit at [facility] after mechanical fall. Imaging revealed compression deformities T12 and L3, was admitted for inability to bear weight and pain control. Condition gradually improved, and patient was able to walk 5/28 with therapy; unfortunately pain continues to be extremely poorly controlled today requiring increased regimen.  Patient essentially bedbound currently in moderate distress -complicated by dementia.   Palliative care has been asked to get involved to further discuss symptom management and goals of care.  Clinical Assessment/Goals of Care:  *Please note that this is a verbal dictation therefore any spelling or grammatical errors are due to the "Dragon Medical One" system interpretation.  I have reviewed medical records including EPIC notes, labs and imaging, received report from bedside RN, assessed the patient who is sitting in the chair in no acute distress.    I met with patient's daughters Zandra Abts, and son-in-law Dannielle Huh to further discuss diagnosis prognosis, GOC, EOL wishes, disposition and options.   I introduced Palliative Medicine as specialized medical care for people living with serious illness. It focuses on providing relief from the symptoms and stress of a serious illness. The goal is to improve quality of life for both the patient and the family.  Medical History Review and Understanding:  A review of Jonni's past medical history inclusive of her anemia, anxiety, degenerative joint disease, hypertension, osteoporosis, GERD, and hearing loss was held.  Social History:  Summa is from [facility] [place] originally.  She grew up in an agricultural  family "working in the fields".  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  She has 4 children though her youngest daughter died at the age of 78.  She is a widow as her husband passed away 22 years ago.  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  She practices within the Protestant faith.  Functional and Nutritional State:  Preceding hospitalization Jessenya lived at [facility] assisted living.  She received help with things like bathing and meal preparation.  She was able to dress herself and feed herself.  She is noted to mobilize independently.  Palliative Symptoms:  Back pain in the setting of T12 and L3 compression deformities.  Presently receiving Robaxin 500 mg every 8 hours as needed and Norco 5-325 mg every 4 hours as needed.  Advance Directives:  A detailed discussion was had today regarding advanced directives.  Yes and a copy of these has been obtained for the medical records.  Code Status:  Concepts specific to code status, artifical feeding and hydration, continued IV antibiotics and rehospitalization was had.  The difference between a aggressive medical intervention path  and a palliative comfort care path for this patient at this time was had.   [place] is an established DO NOT RESUSCITATE DO NOT INTUBATE CODE STATUS.  A MOST form and hard choices for loving people book was presented to patient's family for consideration.  I was able to review the MOST form and explained its utility in patient care.  Discussion:  We discussed that Alta was living independently until about 5 years ago when she started getting notably confused.  It was around that time that her family identified she may  need to live in a memory care facility.  She has been able to participate at [facility] and most B ADLs.  She is noted to be very active as above.  Her family shares that her main role in life was helping people in their sister who struggled with  cerebral palsy and died at the age of 48 was a great devastation to her.  After that she focus her energy on caring for her husband who unfortunately also died 22 years ago.  Patient's family shared that when it's Jaunita's time to meet the Lord that everyone is at peace with that most especially Naydeline herself.  They endorse that the plan from this hospital stay will be for Justina to go to [facility].  We discussed the idea of allowing time for outcomes though if Charron is not thriving and having more bad days than good days then it would not be unreasonable to further consider hospice as an alternative to her care. I described hospice as a service for patients who have a life expectancy of 6 months or less. The goal of hospice is the preservation of dignity and quality at the end phases of life. Under hospice care, the focus changes from curative to symptom relief.   Patient's family were receptive to the information provided.  Discussed the importance of continued conversation with family and their  medical providers regarding overall plan of care and treatment options, ensuring decisions are within the context of the patients values and GOCs.  Decision Maker: Achille Rich (Daughter): [phone] ([place])   SUMMARY OF RECOMMENDATIONS   DNAR/DNI  Introduced a MOST form and recommended family review and complete this  Goals at this time are for [place] to go to rehabilitation  Discussed with patient's family the idea of hospice if she is not thriving in the rehab environment  Ongoing palliative care support until discharge  Code Status/Advance Care Planning: DNAR/DNI   Symptom Management:  Acute on chronic lower back pain -Continue Robaxin 500 mg every 8 hours as needed -Continue Norco 1-2 tabs every 4 hours as needed -Premedicate 30 minutes before activities  Palliative Prophylaxis:  Aspiration, Bowel Regimen, Delirium Protocol, Frequent Pain  Assessment, Oral Care, Palliative Wound Care, and Turn Reposition  Additional Recommendations (Limitations, Scope, Preferences): Continue current care  Psycho-social/Spiritual:  Desire for further Chaplaincy support: Yes Additional Recommendations: Education on progressive dementia   Prognosis: Unclear at this time though based upon patient's chronic comorbidities is an increased for 87-month mortality  Discharge Planning: Discharge to skilled nursing once medically optimized.  Vitals:   07/17/22 2121 07/18/22 0508  BP: (!) 162/69 (!) 146/67  Pulse: 65 71  Resp: 14 13  Temp: 97.7 F (36.5 C) 97.6 F (36.4 C)  SpO2: 92% 99%    Intake/Output Summary (Last 24 hours) at 07/18/2022 0981 Last data filed at 07/18/2022 0600 Gross per 24 hour  Intake 300 ml  Output 550 ml  Net -250 ml   Last Weight  Most recent update: 07/14/2022  7:38 AM    Weight  52 kg (114 lb 10.2 oz)             Gen: Elderly Caucasian female in no acute distress HEENT: moist mucous membranes CV: Regular rate and rhythm PULM: On room air breathing appears even and nonlabored ABD: soft/nontender EXT: No edema Neuro: Alert and oriented to person  PPS: 30%   This conversation/these recommendations were discussed with patient primary care team, Dr. Natale Milch  Billing based on MDM:  High  Problems Addressed: One acute or chronic illness or injury that poses a threat to life or bodily function  Amount and/or Complexity of Data: Category 3:Discussion of management or test interpretation with external physician/other qualified health care professional/appropriate source (not separately reported)  Risks: Decision not to resuscitate or to de-escalate care because of poor prognosis ______________________________________________________ [facility] Palliative Medicine Team Team Cell Phone: [phone] Please utilize secure chat with additional questions, if there is no response within 30  minutes please call the above phone number  Palliative Medicine Team providers are available by phone from 7am to 7pm daily and can be reached through the team cell phone.  Should this patient require assistance outside of these hours, please call the patient's attending physician.

## 2022-07-18 NOTE — Progress Notes (Signed)
Occupational Therapy Treatment Patient Details Name: [name] MRN: [id number] DOB: 01/09/1924 Today's Date: 07/18/2022   History of present illness Patient is a 89 year old female who presented after a mechainical fall at memory care facility. Patient was found to have T12 and L3 compression deformities, back pain, possible UTI, acute urinary retention, and spurious anemia. PMH: dementia, macular hole L eye, anxiety, HTN   OT comments  Pt with improved pain, only moaning x 1 during session. Donned bathrobe with max assist in standing. Ambulated in room and bathroom with min assist, prefers rollator to RW. Attempted to urinate without success. Patient will benefit from continued inpatient follow up therapy, <3 hours/day.    Recommendations for follow up therapy are one component of a multi-disciplinary discharge planning process, led by the attending physician.  Recommendations may be updated based on patient status, additional functional criteria and insurance authorization.    Assistance Recommended at Discharge Frequent or constant Supervision/Assistance  Patient can return home with the following  A lot of help with bathing/dressing/bathroom;Assistance with cooking/housework;Direct supervision/assist for medications management;Assist for transportation;Help with stairs or ramp for entrance;Direct supervision/assist for financial management;Assistance with feeding;A little help with walking and/or transfers   Equipment Recommendations  None recommended by OT    Recommendations for Other Services      Precautions / Restrictions Precautions Precautions: Back;Fall Precaution Comments: back prec for comfort Required Braces or Orthoses: Spinal Brace Spinal Brace: Thoracolumbosacral orthotic Restrictions Weight Bearing Restrictions: No       Mobility Bed Mobility               General bed mobility comments: in chair    Transfers Overall transfer level: Needs  assistance Equipment used: Rolling walker (2 wheels) Transfers: Sit to/from Stand Sit to Stand: Min assist           General transfer comment: assist to rise and transfer weight anterior     Balance Overall balance assessment: History of Falls, Needs assistance   Sitting balance-Leahy Scale: Fair       Standing balance-Leahy Scale: Poor                             ADL either performed or assessed with clinical judgement   ADL Overall ADL's : Needs assistance/impaired Eating/Feeding: Total assistance (to push fluids)               Upper Body Dressing : Maximal assistance;Standing Upper Body Dressing Details (indicate cue type and reason): bathrobe     Toilet Transfer: Minimal assistance;Rolling walker (2 wheels);Regular Toilet;Comfort height toilet;Grab bars           Functional mobility during ADLs: Minimal assistance;Rolling walker (2 wheels)      Extremity/Trunk Assessment              Vision       Perception     Praxis      Cognition Arousal/Alertness: Awake/alert Behavior During Therapy: Flat affect Overall Cognitive Status: History of cognitive impairments - at baseline                                          Exercises      Shoulder Instructions       General Comments      Pertinent Vitals/ Pain       Pain Assessment  Pain Assessment: Faces Faces Pain Scale: Hurts a little bit Pain Location: back Pain Descriptors / Indicators: Moaning Pain Intervention(s): Monitored during session, Repositioned, Heat applied  Home Living                                          Prior Functioning/Environment              Frequency  Min 2X/week        Progress Toward Goals  OT Goals(current goals can now be found in the care plan section)  Progress towards OT goals: Progressing toward goals  Acute Rehab OT Goals OT Goal Formulation: With family Time For Goal Achievement:  07/29/22 Potential to Achieve Goals: Fair  Plan Discharge plan remains appropriate    Co-evaluation                 AM-PAC OT "6 Clicks" Daily Activity     Outcome Measure   Help from another person eating meals?: Total Help from another person taking care of personal grooming?: Total Help from another person toileting, which includes using toliet, bedpan, or urinal?: A Lot Help from another person bathing (including washing, rinsing, drying)?: Total Help from another person to put on and taking off regular upper body clothing?: A Lot Help from another person to put on and taking off regular lower body clothing?: Total 6 Click Score: 8    End of Session Equipment Utilized During Treatment: Gait belt;Rolling walker (2 wheels)  OT Visit Diagnosis: Pain;History of falling (Z91.81)   Activity Tolerance Patient tolerated treatment well   Patient Left in chair;with call bell/phone within reach;with chair alarm set;with family/visitor present   Nurse Communication Mobility status        Time: 0935-1000 OT Time Calculation (min): 25 min  Charges: OT General Charges $OT Visit: 1 Visit OT Treatments $Self Care/Home Management : 23-37 mins  Berna Spare, OTR/L Acute Rehabilitation Services Office: [phone]   Evern Bio 07/18/2022, 10:04 AM

## 2022-07-18 NOTE — Discharge Summary (Deleted)
Physician Discharge Summary  [name] WUJ:[id number] DOB: [birth year] DOA: 07/13/2022  PCP: Pcp, No  Admit date: 07/13/2022 Discharge date: 07/19/2022  Admitted From: Memory care Disposition: Rehab  Recommendations for Outpatient Follow-up:  Follow up with PCP in 1-2 weeks Discuss need for urology follow up pending voiding trial  Discharge Condition: Stable CODE STATUS: DNR Diet recommendation: As tolerated regular diet  Brief/Interim Summary: 89 year old woman presented from memory care unit at [facility] after mechanical fall. Imaging revealed compression deformities T12 and L3, was admitted for inability to bear weight and pain control. Condition gradually improved, and patient was able to walk 5/28 with therapy; Pain now moderately well controlled on increased regimen.  Foley placed due to ongoing issues with urinary retention. Recommend trial removal in 1-2 weeks and referral to urology if unable to void independently.  Discharge Diagnoses:  Principal Problem:   Vertebral compression fracture (HCC) Active Problems:   Anemia   Dementia (HCC)   UTI (urinary tract infection)   Acute cystitis without hematuria   Hypertensive urgency   Thrombocytopenia (HCC)   Fall   Pressure injury of skin   Back pain   Acute urinary retention   Compression fracture of L3 vertebra (HCC)  Goals of care  -Healthy discussion at bedside this morning with daughter and son-in-law -Patient continues to pain, agitated, exacerbated by confusion and baseline dementia - improving now with brief exacerbations. -Discussed goals of care, patient is DNR and would not want "anything else done" per daughter.  We discussed potentially moving forward with palliative care given patient's current poorly controlled pain, bedbound status and quality of life.   T12 and L3 compression fractures, indeterminate age Mechanical fall Intractable back pain, ambulatory dysfunction Plain films showed compression  deformities in the superior endplates at T12 and L3, indeterminate in age.   Continue pain management, PT/OT. TSLO brace. Plan for SNF once pain is more appropriately managed   Abnormal urinalysis, Klebsiella UTI UTI Acute urinary retention omplicated by pain/distraction with dementia Urine culture Klebsiella, sensitivities pending.  No fever, leukocytosis, or signs of sepsis.  Completed ceftriaxone.    Foley catheter placed 5/30 - recommend voiding trial in 1-2 weeks and follow up with urology as necessary   Spurious anemia Spurious thrombocytopenia Hemoglobin stable   Hypertensive urgency Resolved.  Continue diltiazem and metoprolol.    QT prolongation, resolved Repeat EKG 5/26 normal sinus rhythm, normal QTc.  Independent interpretation.   Dementia Delirium precautions.  Continue sertraline.    Discharge Instructions   Allergies as of 07/19/2022   No Known Allergies      Medication List     STOP taking these medications    diltiazem 180 MG 24 hr capsule Commonly known as: CARDIZEM CD   traZODone 150 MG tablet Commonly known as: DESYREL       TAKE these medications    acetaminophen 500 MG tablet Commonly known as: TYLENOL Take 500 mg by mouth in the morning, at noon, and at bedtime. As needed   diltiazem 90 MG 12 hr capsule Commonly known as: CARDIZEM SR Take 1 capsule (90 mg total) by mouth every 12 (twelve) hours.   HYDROcodone-acetaminophen 5-325 MG tablet Commonly known as: NORCO/VICODIN Take 1-2 tablets by mouth every 4 (four) hours as needed for moderate pain or severe pain.   Melatonin 10 MG Tabs Take 10 mg by mouth at bedtime.   Menthol (Topical Analgesic) 4 % Gel Apply 1 Application topically in the morning and at bedtime.   methocarbamol 500  MG tablet Commonly known as: ROBAXIN Take 1 tablet (500 mg total) by mouth every 8 (eight) hours as needed for muscle spasms.   metoprolol succinate 25 MG 24 hr tablet Commonly known as:  TOPROL-XL Take 0.5 tablets (12.5 mg total) by mouth daily.   sertraline 50 MG tablet Commonly known as: ZOLOFT Take 50 mg by mouth daily.   sertraline 25 MG tablet Commonly known as: ZOLOFT Take 25 mg by mouth daily.   Vitamin D 50 MCG (2000 UT) tablet Take 2,000 Units by mouth daily.        Contact information for after-discharge care     Destination     [facility] .   Service: Skilled Nursing Contact information: [address] [place] [address] [phone]                    No Known Allergies  Consultations: None  Procedures/Studies: DG Thoracic Spine 2 View  Result Date: 07/14/2022 CLINICAL DATA:  Fall. EXAM: THORACIC SPINE 2 VIEWS; LUMBAR SPINE - COMPLETE 4+ VIEW; DG HIP (WITH OR WITHOUT PELVIS) 2-3V LEFT COMPARISON:  07/20/2021, 01/10/2020. FINDINGS: Thoracic spine: There is a mild compression deformity in the superior endplate at T12, best seen on lumbar spine images. Evaluation of the upper cervical spine is limited due to osteopenia and overlying structures. Alignment is normal. Multilevel degenerative endplate changes are noted. Lumbar spine: There is a compression deformity in the superior endplate of L3 with loss of vertebral body height of 50%. Alignment is normal. Multilevel intervertebral disc space narrowing, degenerative endplate changes, and facet arthropathy. There is atherosclerotic calcification of the aorta. Cholecystectomy clips are noted in the right upper quadrant. There is evidence of prior hernia repair. Pelvis with left hip: There is no acute fracture or dislocation. Old healed fractures are noted at the superior and inferior pubic rami on the left. Joint space is maintained. IMPRESSION: 1. Compression deformities in the superior endplates at T12 and L3, indeterminate in age. 2. Multilevel degenerative changes in the thoracic and lumbar spine. 3. No acute fracture or dislocation at the hips bilaterally.  Electronically Signed   By: Thornell Sartorius M.D.   On: 07/14/2022 00:55   DG Lumbar Spine Complete  Result Date: 07/14/2022 CLINICAL DATA:  Fall. EXAM: THORACIC SPINE 2 VIEWS; LUMBAR SPINE - COMPLETE 4+ VIEW; DG HIP (WITH OR WITHOUT PELVIS) 2-3V LEFT COMPARISON:  07/20/2021, 01/10/2020. FINDINGS: Thoracic spine: There is a mild compression deformity in the superior endplate at T12, best seen on lumbar spine images. Evaluation of the upper cervical spine is limited due to osteopenia and overlying structures. Alignment is normal. Multilevel degenerative endplate changes are noted. Lumbar spine: There is a compression deformity in the superior endplate of L3 with loss of vertebral body height of 50%. Alignment is normal. Multilevel intervertebral disc space narrowing, degenerative endplate changes, and facet arthropathy. There is atherosclerotic calcification of the aorta. Cholecystectomy clips are noted in the right upper quadrant. There is evidence of prior hernia repair. Pelvis with left hip: There is no acute fracture or dislocation. Old healed fractures are noted at the superior and inferior pubic rami on the left. Joint space is maintained. IMPRESSION: 1. Compression deformities in the superior endplates at T12 and L3, indeterminate in age. 2. Multilevel degenerative changes in the thoracic and lumbar spine. 3. No acute fracture or dislocation at the hips bilaterally. Electronically Signed   By: Thornell Sartorius M.D.   On: 07/14/2022 00:55   DG  Hip Unilat W or Wo Pelvis 2-3 Views Left  Result Date: 07/14/2022 CLINICAL DATA:  Fall. EXAM: THORACIC SPINE 2 VIEWS; LUMBAR SPINE - COMPLETE 4+ VIEW; DG HIP (WITH OR WITHOUT PELVIS) 2-3V LEFT COMPARISON:  07/20/2021, 01/10/2020. FINDINGS: Thoracic spine: There is a mild compression deformity in the superior endplate at T12, best seen on lumbar spine images. Evaluation of the upper cervical spine is limited due to osteopenia and overlying structures. Alignment is  normal. Multilevel degenerative endplate changes are noted. Lumbar spine: There is a compression deformity in the superior endplate of L3 with loss of vertebral body height of 50%. Alignment is normal. Multilevel intervertebral disc space narrowing, degenerative endplate changes, and facet arthropathy. There is atherosclerotic calcification of the aorta. Cholecystectomy clips are noted in the right upper quadrant. There is evidence of prior hernia repair. Pelvis with left hip: There is no acute fracture or dislocation. Old healed fractures are noted at the superior and inferior pubic rami on the left. Joint space is maintained. IMPRESSION: 1. Compression deformities in the superior endplates at T12 and L3, indeterminate in age. 2. Multilevel degenerative changes in the thoracic and lumbar spine. 3. No acute fracture or dislocation at the hips bilaterally. Electronically Signed   By: Thornell Sartorius M.D.   On: 07/14/2022 00:55   DG Wrist Complete Right  Result Date: 07/14/2022 CLINICAL DATA:  fall, R forearm and wrist injury EXAM: RIGHT WRIST - COMPLETE 3+ VIEW COMPARISON:  None Available. FINDINGS: Diffusely decreased bone density. There is no evidence of fracture or dislocation. Severe degenerative changes of the carpal metatarsal joints. Soft tissues are unremarkable. Vascular calcifications. IMPRESSION: No acute displaced fracture or dislocation. Electronically Signed   By: Tish Frederickson M.D.   On: 07/14/2022 00:54   DG Forearm Right  Result Date: 07/14/2022 CLINICAL DATA:  fall, R forearm and wrist injury EXAM: RIGHT FOREARM - 2 VIEW COMPARISON:  None Available. FINDINGS: Diffusely decreased bone density. Degenerative changes of the wrist and elbow noted. There is no evidence of fracture or other focal bone lesions. Nonaggressive appearing periosteal reaction along the radius shaft. Soft tissues are unremarkable. IMPRESSION: No acute displaced fracture or dislocation. Electronically Signed   By: Tish Frederickson M.D.   On: 07/14/2022 00:52   CT Head Wo Contrast  Result Date: 07/14/2022 CLINICAL DATA:  Neck trauma (Age >= 65y); Head trauma, minor (Age >= 65y) EXAM: CT HEAD WITHOUT CONTRAST CT CERVICAL SPINE WITHOUT CONTRAST TECHNIQUE: Multidetector CT imaging of the head and cervical spine was performed following the standard protocol without intravenous contrast. Multiplanar CT image reconstructions of the cervical spine were also generated. RADIATION DOSE REDUCTION: This exam was performed according to the departmental dose-optimization program which includes automated exposure control, adjustment of the mA and/or kV according to patient size and/or use of iterative reconstruction technique. COMPARISON:  None Available. FINDINGS: CT HEAD FINDINGS Brain: Cerebral ventricle sizes are concordant with the degree of cerebral volume loss. Patchy and confluent areas of decreased attenuation are noted throughout the deep and periventricular white matter of the cerebral hemispheres bilaterally, compatible with chronic microvascular ischemic disease. No evidence of large-territorial acute infarction. No parenchymal hemorrhage. No mass lesion. No extra-axial collection. No mass effect or midline shift. No hydrocephalus. Basilar cisterns are patent. Vascular: No hyperdense vessel. Atherosclerotic calcifications are present within the cavernous internal carotid and vertebral arteries. Skull: No acute fracture or focal lesion. Sinuses/Orbits: Left maxillary sinus mucosal thickening. Otherwise paranasal sinuses and mastoid air cells are clear. Bilateral lens replacement.  Otherwise the orbits are unremarkable. Other: None. CT CERVICAL SPINE FINDINGS Alignment: Grade 1 anterolisthesis C2 on C3, C3 on C4, C4 on C5, C5 on C6 on C6 on C7. Skull base and vertebrae: Multilevel moderate to severe degenerative changes spine. Multilevel moderate to severe right osseous neural foraminal stenosis. No acute fracture. No aggressive  appearing focal osseous lesion or focal pathologic process. Soft tissues and spinal canal: No prevertebral fluid or swelling. No visible canal hematoma. Upper chest: Unremarkable. Other: None. IMPRESSION: 1. No acute intracranial abnormality. 2. No acute displaced fracture or traumatic listhesis of the cervical spine. Electronically Signed   By: Tish Frederickson M.D.   On: 07/14/2022 00:23   CT Cervical Spine Wo Contrast  Result Date: 07/14/2022 CLINICAL DATA:  Neck trauma (Age >= 65y); Head trauma, minor (Age >= 65y) EXAM: CT HEAD WITHOUT CONTRAST CT CERVICAL SPINE WITHOUT CONTRAST TECHNIQUE: Multidetector CT imaging of the head and cervical spine was performed following the standard protocol without intravenous contrast. Multiplanar CT image reconstructions of the cervical spine were also generated. RADIATION DOSE REDUCTION: This exam was performed according to the departmental dose-optimization program which includes automated exposure control, adjustment of the mA and/or kV according to patient size and/or use of iterative reconstruction technique. COMPARISON:  None Available. FINDINGS: CT HEAD FINDINGS Brain: Cerebral ventricle sizes are concordant with the degree of cerebral volume loss. Patchy and confluent areas of decreased attenuation are noted throughout the deep and periventricular white matter of the cerebral hemispheres bilaterally, compatible with chronic microvascular ischemic disease. No evidence of large-territorial acute infarction. No parenchymal hemorrhage. No mass lesion. No extra-axial collection. No mass effect or midline shift. No hydrocephalus. Basilar cisterns are patent. Vascular: No hyperdense vessel. Atherosclerotic calcifications are present within the cavernous internal carotid and vertebral arteries. Skull: No acute fracture or focal lesion. Sinuses/Orbits: Left maxillary sinus mucosal thickening. Otherwise paranasal sinuses and mastoid air cells are clear. Bilateral lens  replacement. Otherwise the orbits are unremarkable. Other: None. CT CERVICAL SPINE FINDINGS Alignment: Grade 1 anterolisthesis C2 on C3, C3 on C4, C4 on C5, C5 on C6 on C6 on C7. Skull base and vertebrae: Multilevel moderate to severe degenerative changes spine. Multilevel moderate to severe right osseous neural foraminal stenosis. No acute fracture. No aggressive appearing focal osseous lesion or focal pathologic process. Soft tissues and spinal canal: No prevertebral fluid or swelling. No visible canal hematoma. Upper chest: Unremarkable. Other: None. IMPRESSION: 1. No acute intracranial abnormality. 2. No acute displaced fracture or traumatic listhesis of the cervical spine. Electronically Signed   By: Tish Frederickson M.D.   On: 07/14/2022 00:23     Subjective: No acute issues/events overnight   Discharge Exam: Vitals:   07/18/22 2247 07/19/22 0531  BP: (!) 141/68 135/65  Pulse: 73 80  Resp: 18 16  Temp: 98.8 F (37.1 C) (!) 97.4 F (36.3 C)  SpO2: 97% 97%   Vitals:   07/18/22 0508 07/18/22 1412 07/18/22 2247 07/19/22 0531  BP: (!) 146/67 127/63 (!) 141/68 135/65  Pulse: 71 73 73 80  Resp: 13 16 18 16   Temp: 97.6 F (36.4 C) 98.1 F (36.7 C) 98.8 F (37.1 C) (!) 97.4 F (36.3 C)  TempSrc: Axillary     SpO2: 99% 97% 97% 97%  Weight:      Height:        General: Pt is alert, awake, not in acute distress Cardiovascular: RRR, S1/S2 +, no rubs, no gallops Respiratory: CTA bilaterally, no wheezing, no rhonchi Abdominal: Soft,  NT, ND, bowel sounds + Extremities: no edema, no cyanosis    The results of significant diagnostics from this hospitalization (including imaging, microbiology, ancillary and laboratory) are listed below for reference.     Microbiology: Recent Results (from the past 240 hour(s))  Urine Culture     Status: Abnormal   Collection Time: 07/14/22  1:22 AM   Specimen: Urine, Clean Catch  Result Value Ref Range Status   Specimen Description   Final     URINE, CLEAN CATCH Performed at [facility], [address] [place], [place] [address]    Special Requests   Final    NONE Performed at [facility], [address] [place], [place] [address]    Culture (A)  Final    >=100,000 COLONIES/mL KLEBSIELLA PNEUMONIAE Two isolates with different morphologies were identified as the same organism.The most resistant organism was reported. Performed at [facility], [address], [place], [place] [address]    Report Status 07/18/2022 FINAL  Final   Organism ID, Bacteria KLEBSIELLA PNEUMONIAE (A)  Final      Susceptibility   Klebsiella pneumoniae - MIC*    AMPICILLIN >=32 RESISTANT Resistant     CEFAZOLIN <=4 SENSITIVE Sensitive     CEFEPIME <=0.12 SENSITIVE Sensitive     CEFTRIAXONE <=0.25 SENSITIVE Sensitive     CIPROFLOXACIN <=0.25 SENSITIVE Sensitive     GENTAMICIN <=1 SENSITIVE Sensitive     IMIPENEM 2 SENSITIVE Sensitive     NITROFURANTOIN 128 RESISTANT Resistant     TRIMETH/SULFA <=20 SENSITIVE Sensitive     AMPICILLIN/SULBACTAM 8 SENSITIVE Sensitive     PIP/TAZO 8 SENSITIVE Sensitive     * >=100,000 COLONIES/mL KLEBSIELLA PNEUMONIAE  MRSA Next Gen by PCR, Nasal     Status: None   Collection Time: 07/14/22  6:16 AM   Specimen: Nasal Mucosa; Nasal Swab  Result Value Ref Range Status   MRSA by PCR Next Gen NOT DETECTED NOT DETECTED Final    Comment: (NOTE) The GeneXpert MRSA Assay (FDA approved for NASAL specimens only), is one component of a comprehensive MRSA colonization surveillance program. It is not intended to diagnose MRSA infection nor to guide or monitor treatment for MRSA infections. Test performance is not FDA approved in patients less than 2 years old. Performed at [facility], [address] [place], [place] [address]      Labs: BNP (last 3 results) No results for input(s): "BNP" in the last 8760 hours. Basic Metabolic Panel: Recent Labs   Lab 07/14/22 0122 07/14/22 0758  NA 138 134*  K 3.5 3.0*  CL 102 100  CO2 25 25  GLUCOSE 134* 126*  BUN 17 14  CREATININE 0.56 0.53  CALCIUM 9.2 8.9  MG  --  1.9   Liver Function Tests: No results for input(s): "AST", "ALT", "ALKPHOS", "BILITOT", "PROT", "ALBUMIN" in the last 168 hours. No results for input(s): "LIPASE", "AMYLASE" in the last 168 hours. No results for input(s): "AMMONIA" in the last 168 hours. CBC: Recent Labs  Lab 07/14/22 0122 07/14/22 0758 07/14/22 1103  WBC 6.5 12.8* 15.1*  NEUTROABS 5.3  --   --   HGB 6.5* 12.1 13.7  HCT 19.9* 35.3* 43.2  MCV 99.0 95.9 101.4*  PLT 112* 216 181   Cardiac Enzymes: No results for input(s): "CKTOTAL", "CKMB", "CKMBINDEX", "TROPONINI" in the last 168 hours. BNP: Invalid input(s): "POCBNP" CBG: No results for input(s): "GLUCAP" in the last 168 hours. D-Dimer No results for input(s): "  DDIMER" in the last 72 hours. Hgb A1c No results for input(s): "HGBA1C" in the last 72 hours. Lipid Profile No results for input(s): "CHOL", "HDL", "LDLCALC", "TRIG", "CHOLHDL", "LDLDIRECT" in the last 72 hours. Thyroid function studies No results for input(s): "TSH", "T4TOTAL", "T3FREE", "THYROIDAB" in the last 72 hours.  Invalid input(s): "FREET3" Anemia work up No results for input(s): "VITAMINB12", "FOLATE", "FERRITIN", "TIBC", "IRON", "RETICCTPCT" in the last 72 hours. Urinalysis    Component Value Date/Time   COLORURINE YELLOW 07/14/2022 0122   APPEARANCEUR CLEAR 07/14/2022 0122   LABSPEC 1.010 07/14/2022 0122   PHURINE 7.0 07/14/2022 0122   GLUCOSEU NEGATIVE 07/14/2022 0122   GLUCOSEU NEGATIVE 05/15/2006 1125   HGBUR MODERATE (A) 07/14/2022 0122   BILIRUBINUR NEGATIVE 07/14/2022 0122   BILIRUBINUR Positive 01/10/2020 1437   KETONESUR NEGATIVE 07/14/2022 0122   PROTEINUR NEGATIVE 07/14/2022 0122   UROBILINOGEN 1.0 01/10/2020 1437   UROBILINOGEN 0.2 mg/dL 16/11/9602 5409   NITRITE POSITIVE (A) 07/14/2022 0122    LEUKOCYTESUR LARGE (A) 07/14/2022 0122   Sepsis Labs Recent Labs  Lab 07/14/22 0122 07/14/22 0758 07/14/22 1103  WBC 6.5 12.8* 15.1*   Microbiology Recent Results (from the past 240 hour(s))  Urine Culture     Status: Abnormal   Collection Time: 07/14/22  1:22 AM   Specimen: Urine, Clean Catch  Result Value Ref Range Status   Specimen Description   Final    URINE, CLEAN CATCH Performed at [facility], [address], [place], [place] [address]    Special Requests   Final    NONE Performed at [facility], [address], [place], [place] [address]    Culture (A)  Final    >=100,000 COLONIES/mL KLEBSIELLA PNEUMONIAE Two isolates with different morphologies were identified as the same organism.The most resistant organism was reported. Performed at [facility], [address] [place], [place] [address]    Report Status 07/18/2022 FINAL  Final   Organism ID, Bacteria KLEBSIELLA PNEUMONIAE (A)  Final      Susceptibility   Klebsiella pneumoniae - MIC*    AMPICILLIN >=32 RESISTANT Resistant     CEFAZOLIN <=4 SENSITIVE Sensitive     CEFEPIME <=0.12 SENSITIVE Sensitive     CEFTRIAXONE <=0.25 SENSITIVE Sensitive     CIPROFLOXACIN <=0.25 SENSITIVE Sensitive     GENTAMICIN <=1 SENSITIVE Sensitive     IMIPENEM 2 SENSITIVE Sensitive     NITROFURANTOIN 128 RESISTANT Resistant     TRIMETH/SULFA <=20 SENSITIVE Sensitive     AMPICILLIN/SULBACTAM 8 SENSITIVE Sensitive     PIP/TAZO 8 SENSITIVE Sensitive     * >=100,000 COLONIES/mL KLEBSIELLA PNEUMONIAE  MRSA Next Gen by PCR, Nasal     Status: None   Collection Time: 07/14/22  6:16 AM   Specimen: Nasal Mucosa; Nasal Swab  Result Value Ref Range Status   MRSA by PCR Next Gen NOT DETECTED NOT DETECTED Final    Comment: (NOTE) The GeneXpert MRSA Assay (FDA approved for NASAL specimens only), is one component of a comprehensive MRSA colonization surveillance program. It is not intended to  diagnose MRSA infection nor to guide or monitor treatment for MRSA infections. Test performance is not FDA approved in patients less than 85 years old. Performed at [facility], [address], [place], [place] [address]      Time coordinating discharge: Over 30 minutes  SIGNED:   Azucena Fallen, DO [facility] 07/19/2022, 11:57 AM Pager   If 7PM-7AM, please contact night-coverage [email or web address]

## 2022-07-18 NOTE — TOC Progression Note (Signed)
Transition of Care [facility]) - Progression Note    Patient Details  Name: [name] MRN: [id number] Date of Birth: 1923-08-19  [facility] CM/SW Contact  Amada Jupiter, LCSW Phone Number: 07/18/2022, 3:35 PM  Clinical Narrative:     Have received insurance authorization for pt to admit to [facility], however,  pt not yet medically cleared for dc.  Anticipate dc to facility tomorrow.  Family aware and agreeable.   Expected Discharge Plan: Skilled Nursing Facility Barriers to Discharge: Insurance Authorization, SNF Pending bed offer, Continued Medical Work up  Expected Discharge Plan and Services In-house Referral: Clinical Social Work   Post Acute Care Choice: Skilled Nursing Facility Living arrangements for the past 2 months: Assisted Living Facility                 DME Arranged: N/A DME Agency: NA                   Social Determinants of Health (SDOH) Interventions SDOH Screenings   Food Insecurity: Patient Unable To Answer (07/14/2022)  Housing: High Risk (07/14/2022)  Transportation Needs: Patient Unable To Answer (07/14/2022)  Utilities: Patient Unable To Answer (07/14/2022)  Depression (PHQ2-9): Low Risk  (03/22/2020)  Tobacco Use: Low Risk  (07/13/2022)    Readmission Risk Interventions     No data to display

## 2022-07-19 DIAGNOSIS — R6889 Other general symptoms and signs: Secondary | ICD-10-CM | POA: Diagnosis not present

## 2022-07-19 DIAGNOSIS — S22080S Wedge compression fracture of T11-T12 vertebra, sequela: Secondary | ICD-10-CM | POA: Diagnosis not present

## 2022-07-19 DIAGNOSIS — Z743 Need for continuous supervision: Secondary | ICD-10-CM | POA: Diagnosis not present

## 2022-07-19 DIAGNOSIS — N139 Obstructive and reflux uropathy, unspecified: Secondary | ICD-10-CM | POA: Diagnosis not present

## 2022-07-19 DIAGNOSIS — Z7401 Bed confinement status: Secondary | ICD-10-CM | POA: Diagnosis not present

## 2022-07-19 DIAGNOSIS — I129 Hypertensive chronic kidney disease with stage 1 through stage 4 chronic kidney disease, or unspecified chronic kidney disease: Secondary | ICD-10-CM | POA: Diagnosis not present

## 2022-07-19 DIAGNOSIS — E538 Deficiency of other specified B group vitamins: Secondary | ICD-10-CM | POA: Diagnosis not present

## 2022-07-19 DIAGNOSIS — R319 Hematuria, unspecified: Secondary | ICD-10-CM | POA: Diagnosis not present

## 2022-07-19 DIAGNOSIS — S32030A Wedge compression fracture of third lumbar vertebra, initial encounter for closed fracture: Secondary | ICD-10-CM | POA: Diagnosis not present

## 2022-07-19 DIAGNOSIS — M1990 Unspecified osteoarthritis, unspecified site: Secondary | ICD-10-CM | POA: Diagnosis not present

## 2022-07-19 DIAGNOSIS — R040 Epistaxis: Secondary | ICD-10-CM | POA: Diagnosis not present

## 2022-07-19 DIAGNOSIS — I872 Venous insufficiency (chronic) (peripheral): Secondary | ICD-10-CM | POA: Diagnosis not present

## 2022-07-19 DIAGNOSIS — L8990 Pressure ulcer of unspecified site, unspecified stage: Secondary | ICD-10-CM | POA: Diagnosis not present

## 2022-07-19 DIAGNOSIS — F32A Depression, unspecified: Secondary | ICD-10-CM | POA: Diagnosis not present

## 2022-07-19 DIAGNOSIS — M545 Low back pain, unspecified: Secondary | ICD-10-CM | POA: Diagnosis not present

## 2022-07-19 DIAGNOSIS — S22080A Wedge compression fracture of T11-T12 vertebra, initial encounter for closed fracture: Secondary | ICD-10-CM | POA: Diagnosis not present

## 2022-07-19 DIAGNOSIS — M542 Cervicalgia: Secondary | ICD-10-CM | POA: Diagnosis not present

## 2022-07-19 DIAGNOSIS — N183 Chronic kidney disease, stage 3 unspecified: Secondary | ICD-10-CM | POA: Diagnosis not present

## 2022-07-19 DIAGNOSIS — R404 Transient alteration of awareness: Secondary | ICD-10-CM | POA: Diagnosis not present

## 2022-07-19 DIAGNOSIS — D509 Iron deficiency anemia, unspecified: Secondary | ICD-10-CM | POA: Diagnosis not present

## 2022-07-19 DIAGNOSIS — M810 Age-related osteoporosis without current pathological fracture: Secondary | ICD-10-CM | POA: Diagnosis not present

## 2022-07-19 DIAGNOSIS — S32030S Wedge compression fracture of third lumbar vertebra, sequela: Secondary | ICD-10-CM | POA: Diagnosis not present

## 2022-07-19 DIAGNOSIS — W19XXXA Unspecified fall, initial encounter: Secondary | ICD-10-CM | POA: Diagnosis not present

## 2022-07-19 DIAGNOSIS — R296 Repeated falls: Secondary | ICD-10-CM | POA: Diagnosis not present

## 2022-07-19 DIAGNOSIS — Z515 Encounter for palliative care: Secondary | ICD-10-CM | POA: Diagnosis not present

## 2022-07-19 DIAGNOSIS — R339 Retention of urine, unspecified: Secondary | ICD-10-CM | POA: Diagnosis not present

## 2022-07-19 DIAGNOSIS — M4850XD Collapsed vertebra, not elsewhere classified, site unspecified, subsequent encounter for fracture with routine healing: Secondary | ICD-10-CM | POA: Diagnosis not present

## 2022-07-19 DIAGNOSIS — I4891 Unspecified atrial fibrillation: Secondary | ICD-10-CM | POA: Diagnosis not present

## 2022-07-19 DIAGNOSIS — I160 Hypertensive urgency: Secondary | ICD-10-CM | POA: Diagnosis not present

## 2022-07-19 DIAGNOSIS — R1314 Dysphagia, pharyngoesophageal phase: Secondary | ICD-10-CM | POA: Diagnosis not present

## 2022-07-19 DIAGNOSIS — N390 Urinary tract infection, site not specified: Secondary | ICD-10-CM | POA: Diagnosis not present

## 2022-07-19 DIAGNOSIS — E559 Vitamin D deficiency, unspecified: Secondary | ICD-10-CM | POA: Diagnosis not present

## 2022-07-19 DIAGNOSIS — M1991 Primary osteoarthritis, unspecified site: Secondary | ICD-10-CM | POA: Diagnosis not present

## 2022-07-19 DIAGNOSIS — F0283 Dementia in other diseases classified elsewhere, unspecified severity, with mood disturbance: Secondary | ICD-10-CM | POA: Diagnosis not present

## 2022-07-19 DIAGNOSIS — D696 Thrombocytopenia, unspecified: Secondary | ICD-10-CM | POA: Diagnosis not present

## 2022-07-19 MED ORDER — DILTIAZEM HCL ER 90 MG PO CP12: 90.0000 mg | ORAL_CAPSULE | Freq: Two times a day (BID) | ORAL | 0 refills | Status: AC

## 2022-07-19 MED ORDER — HYDROCODONE-ACETAMINOPHEN 5-325 MG PO TABS: 1.0000 | ORAL_TABLET | ORAL | 0 refills | Status: AC | PRN

## 2022-07-19 MED ORDER — METHOCARBAMOL 500 MG PO TABS: 500.0000 mg | ORAL_TABLET | Freq: Three times a day (TID) | ORAL | 0 refills | Status: AC | PRN

## 2022-07-19 NOTE — Discharge Summary (Signed)
Physician Discharge Summary  GENET FISCHL ZOX:[id number] DOB: 08-Dec-1923 DOA: 07/13/2022  PCP: Pcp, No  Admit date: 07/13/2022 Discharge date: 07/19/2022  Admitted From: Memory care Disposition: Rehab  Recommendations for Outpatient Follow-up:  Follow up with PCP in 1-2 weeks Discuss need for urology follow up pending voiding trial  Discharge Condition: Stable CODE STATUS: DNR Diet recommendation: As tolerated regular diet  Brief/Interim Summary: 89 year old woman presented from memory care unit at [facility] after mechanical fall. Imaging revealed compression deformities T12 and L3, was admitted for inability to bear weight and pain control. Condition gradually improved, and patient was able to walk 5/28 with therapy; Pain now moderately well controlled on increased regimen.  Foley placed due to ongoing issues with urinary retention. Recommend trial removal in 1-2 weeks and referral to urology if unable to void independently.  Discharge Diagnoses:  Principal Problem:   Vertebral compression fracture (HCC) Active Problems:   Anemia   Dementia (HCC)   UTI (urinary tract infection)   Acute cystitis without hematuria   Hypertensive urgency   Thrombocytopenia (HCC)   Fall   Pressure injury of skin   Back pain   Acute urinary retention   Compression fracture of L3 vertebra (HCC)  Goals of care  -Healthy discussion at bedside this morning with daughter and son-in-law -Patient continues to pain, agitated, exacerbated by confusion and baseline dementia - improving now with brief exacerbations. -Discussed goals of care, patient is DNR and would not want "anything else done" per daughter.  We discussed potentially moving forward with palliative care given patient's current poorly controlled pain, bedbound status and quality of life.   T12 and L3 compression fractures, indeterminate age Mechanical fall Intractable back pain, ambulatory dysfunction Plain films showed compression  deformities in the superior endplates at T12 and L3, indeterminate in age.   Continue pain management, PT/OT. TSLO brace. Plan for SNF once pain is more appropriately managed   Abnormal urinalysis, Klebsiella UTI UTI Acute urinary retention omplicated by pain/distraction with dementia Urine culture Klebsiella, sensitivities pending.  No fever, leukocytosis, or signs of sepsis.  Completed ceftriaxone.    Foley catheter placed 5/30 - recommend voiding trial in 1-2 weeks and follow up with urology as necessary   Spurious anemia Spurious thrombocytopenia Hemoglobin stable   Hypertensive urgency Resolved.  Continue diltiazem and metoprolol.    QT prolongation, resolved Repeat EKG 5/26 normal sinus rhythm, normal QTc.  Independent interpretation.   Dementia Delirium precautions.  Continue sertraline.    Discharge Instructions   Allergies as of 07/19/2022   No Known Allergies      Medication List     STOP taking these medications    diltiazem 180 MG 24 hr capsule Commonly known as: CARDIZEM CD   traZODone 150 MG tablet Commonly known as: DESYREL       TAKE these medications    acetaminophen 500 MG tablet Commonly known as: TYLENOL Take 500 mg by mouth in the morning, at noon, and at bedtime. As needed   diltiazem 90 MG 12 hr capsule Commonly known as: CARDIZEM SR Take 1 capsule (90 mg total) by mouth every 12 (twelve) hours.   HYDROcodone-acetaminophen 5-325 MG tablet Commonly known as: NORCO/VICODIN Take 1-2 tablets by mouth every 4 (four) hours as needed for moderate pain or severe pain.   Melatonin 10 MG Tabs Take 10 mg by mouth at bedtime.   Menthol (Topical Analgesic) 4 % Gel Apply 1 Application topically in the morning and at bedtime.   methocarbamol 500  MG tablet Commonly known as: ROBAXIN Take 1 tablet (500 mg total) by mouth every 8 (eight) hours as needed for muscle spasms.   metoprolol succinate 25 MG 24 hr tablet Commonly known as:  TOPROL-XL Take 0.5 tablets (12.5 mg total) by mouth daily.   sertraline 50 MG tablet Commonly known as: ZOLOFT Take 50 mg by mouth daily.   sertraline 25 MG tablet Commonly known as: ZOLOFT Take 25 mg by mouth daily.   Vitamin D 50 MCG (2000 UT) tablet Take 2,000 Units by mouth daily.        Contact information for after-discharge care     Destination     [facility] .   Service: Skilled Nursing Contact information: [address] [place] [address] [phone]                    No Known Allergies  Consultations: None  Procedures/Studies: DG Thoracic Spine 2 View  Result Date: 07/14/2022 CLINICAL DATA:  Fall. EXAM: THORACIC SPINE 2 VIEWS; LUMBAR SPINE - COMPLETE 4+ VIEW; DG HIP (WITH OR WITHOUT PELVIS) 2-3V LEFT COMPARISON:  07/20/2021, 01/10/2020. FINDINGS: Thoracic spine: There is a mild compression deformity in the superior endplate at T12, best seen on lumbar spine images. Evaluation of the upper cervical spine is limited due to osteopenia and overlying structures. Alignment is normal. Multilevel degenerative endplate changes are noted. Lumbar spine: There is a compression deformity in the superior endplate of L3 with loss of vertebral body height of 50%. Alignment is normal. Multilevel intervertebral disc space narrowing, degenerative endplate changes, and facet arthropathy. There is atherosclerotic calcification of the aorta. Cholecystectomy clips are noted in the right upper quadrant. There is evidence of prior hernia repair. Pelvis with left hip: There is no acute fracture or dislocation. Old healed fractures are noted at the superior and inferior pubic rami on the left. Joint space is maintained. IMPRESSION: 1. Compression deformities in the superior endplates at T12 and L3, indeterminate in age. 2. Multilevel degenerative changes in the thoracic and lumbar spine. 3. No acute fracture or dislocation at the hips bilaterally.  Electronically Signed   By: Thornell Sartorius M.D.   On: 07/14/2022 00:55   DG Lumbar Spine Complete  Result Date: 07/14/2022 CLINICAL DATA:  Fall. EXAM: THORACIC SPINE 2 VIEWS; LUMBAR SPINE - COMPLETE 4+ VIEW; DG HIP (WITH OR WITHOUT PELVIS) 2-3V LEFT COMPARISON:  07/20/2021, 01/10/2020. FINDINGS: Thoracic spine: There is a mild compression deformity in the superior endplate at T12, best seen on lumbar spine images. Evaluation of the upper cervical spine is limited due to osteopenia and overlying structures. Alignment is normal. Multilevel degenerative endplate changes are noted. Lumbar spine: There is a compression deformity in the superior endplate of L3 with loss of vertebral body height of 50%. Alignment is normal. Multilevel intervertebral disc space narrowing, degenerative endplate changes, and facet arthropathy. There is atherosclerotic calcification of the aorta. Cholecystectomy clips are noted in the right upper quadrant. There is evidence of prior hernia repair. Pelvis with left hip: There is no acute fracture or dislocation. Old healed fractures are noted at the superior and inferior pubic rami on the left. Joint space is maintained. IMPRESSION: 1. Compression deformities in the superior endplates at T12 and L3, indeterminate in age. 2. Multilevel degenerative changes in the thoracic and lumbar spine. 3. No acute fracture or dislocation at the hips bilaterally. Electronically Signed   By: Thornell Sartorius M.D.   On: 07/14/2022 00:55   DG  Hip Unilat W or Wo Pelvis 2-3 Views Left  Result Date: 07/14/2022 CLINICAL DATA:  Fall. EXAM: THORACIC SPINE 2 VIEWS; LUMBAR SPINE - COMPLETE 4+ VIEW; DG HIP (WITH OR WITHOUT PELVIS) 2-3V LEFT COMPARISON:  07/20/2021, 01/10/2020. FINDINGS: Thoracic spine: There is a mild compression deformity in the superior endplate at T12, best seen on lumbar spine images. Evaluation of the upper cervical spine is limited due to osteopenia and overlying structures. Alignment is  normal. Multilevel degenerative endplate changes are noted. Lumbar spine: There is a compression deformity in the superior endplate of L3 with loss of vertebral body height of 50%. Alignment is normal. Multilevel intervertebral disc space narrowing, degenerative endplate changes, and facet arthropathy. There is atherosclerotic calcification of the aorta. Cholecystectomy clips are noted in the right upper quadrant. There is evidence of prior hernia repair. Pelvis with left hip: There is no acute fracture or dislocation. Old healed fractures are noted at the superior and inferior pubic rami on the left. Joint space is maintained. IMPRESSION: 1. Compression deformities in the superior endplates at T12 and L3, indeterminate in age. 2. Multilevel degenerative changes in the thoracic and lumbar spine. 3. No acute fracture or dislocation at the hips bilaterally. Electronically Signed   By: Thornell Sartorius M.D.   On: 07/14/2022 00:55   DG Wrist Complete Right  Result Date: 07/14/2022 CLINICAL DATA:  fall, R forearm and wrist injury EXAM: RIGHT WRIST - COMPLETE 3+ VIEW COMPARISON:  None Available. FINDINGS: Diffusely decreased bone density. There is no evidence of fracture or dislocation. Severe degenerative changes of the carpal metatarsal joints. Soft tissues are unremarkable. Vascular calcifications. IMPRESSION: No acute displaced fracture or dislocation. Electronically Signed   By: Tish Frederickson M.D.   On: 07/14/2022 00:54   DG Forearm Right  Result Date: 07/14/2022 CLINICAL DATA:  fall, R forearm and wrist injury EXAM: RIGHT FOREARM - 2 VIEW COMPARISON:  None Available. FINDINGS: Diffusely decreased bone density. Degenerative changes of the wrist and elbow noted. There is no evidence of fracture or other focal bone lesions. Nonaggressive appearing periosteal reaction along the radius shaft. Soft tissues are unremarkable. IMPRESSION: No acute displaced fracture or dislocation. Electronically Signed   By: Tish Frederickson M.D.   On: 07/14/2022 00:52   CT Head Wo Contrast  Result Date: 07/14/2022 CLINICAL DATA:  Neck trauma (Age >= 65y); Head trauma, minor (Age >= 65y) EXAM: CT HEAD WITHOUT CONTRAST CT CERVICAL SPINE WITHOUT CONTRAST TECHNIQUE: Multidetector CT imaging of the head and cervical spine was performed following the standard protocol without intravenous contrast. Multiplanar CT image reconstructions of the cervical spine were also generated. RADIATION DOSE REDUCTION: This exam was performed according to the departmental dose-optimization program which includes automated exposure control, adjustment of the mA and/or kV according to patient size and/or use of iterative reconstruction technique. COMPARISON:  None Available. FINDINGS: CT HEAD FINDINGS Brain: Cerebral ventricle sizes are concordant with the degree of cerebral volume loss. Patchy and confluent areas of decreased attenuation are noted throughout the deep and periventricular white matter of the cerebral hemispheres bilaterally, compatible with chronic microvascular ischemic disease. No evidence of large-territorial acute infarction. No parenchymal hemorrhage. No mass lesion. No extra-axial collection. No mass effect or midline shift. No hydrocephalus. Basilar cisterns are patent. Vascular: No hyperdense vessel. Atherosclerotic calcifications are present within the cavernous internal carotid and vertebral arteries. Skull: No acute fracture or focal lesion. Sinuses/Orbits: Left maxillary sinus mucosal thickening. Otherwise paranasal sinuses and mastoid air cells are clear. Bilateral lens replacement.  Otherwise the orbits are unremarkable. Other: None. CT CERVICAL SPINE FINDINGS Alignment: Grade 1 anterolisthesis C2 on C3, C3 on C4, C4 on C5, C5 on C6 on C6 on C7. Skull base and vertebrae: Multilevel moderate to severe degenerative changes spine. Multilevel moderate to severe right osseous neural foraminal stenosis. No acute fracture. No aggressive  appearing focal osseous lesion or focal pathologic process. Soft tissues and spinal canal: No prevertebral fluid or swelling. No visible canal hematoma. Upper chest: Unremarkable. Other: None. IMPRESSION: 1. No acute intracranial abnormality. 2. No acute displaced fracture or traumatic listhesis of the cervical spine. Electronically Signed   By: Tish Frederickson M.D.   On: 07/14/2022 00:23   CT Cervical Spine Wo Contrast  Result Date: 07/14/2022 CLINICAL DATA:  Neck trauma (Age >= 65y); Head trauma, minor (Age >= 65y) EXAM: CT HEAD WITHOUT CONTRAST CT CERVICAL SPINE WITHOUT CONTRAST TECHNIQUE: Multidetector CT imaging of the head and cervical spine was performed following the standard protocol without intravenous contrast. Multiplanar CT image reconstructions of the cervical spine were also generated. RADIATION DOSE REDUCTION: This exam was performed according to the departmental dose-optimization program which includes automated exposure control, adjustment of the mA and/or kV according to patient size and/or use of iterative reconstruction technique. COMPARISON:  None Available. FINDINGS: CT HEAD FINDINGS Brain: Cerebral ventricle sizes are concordant with the degree of cerebral volume loss. Patchy and confluent areas of decreased attenuation are noted throughout the deep and periventricular white matter of the cerebral hemispheres bilaterally, compatible with chronic microvascular ischemic disease. No evidence of large-territorial acute infarction. No parenchymal hemorrhage. No mass lesion. No extra-axial collection. No mass effect or midline shift. No hydrocephalus. Basilar cisterns are patent. Vascular: No hyperdense vessel. Atherosclerotic calcifications are present within the cavernous internal carotid and vertebral arteries. Skull: No acute fracture or focal lesion. Sinuses/Orbits: Left maxillary sinus mucosal thickening. Otherwise paranasal sinuses and mastoid air cells are clear. Bilateral lens  replacement. Otherwise the orbits are unremarkable. Other: None. CT CERVICAL SPINE FINDINGS Alignment: Grade 1 anterolisthesis C2 on C3, C3 on C4, C4 on C5, C5 on C6 on C6 on C7. Skull base and vertebrae: Multilevel moderate to severe degenerative changes spine. Multilevel moderate to severe right osseous neural foraminal stenosis. No acute fracture. No aggressive appearing focal osseous lesion or focal pathologic process. Soft tissues and spinal canal: No prevertebral fluid or swelling. No visible canal hematoma. Upper chest: Unremarkable. Other: None. IMPRESSION: 1. No acute intracranial abnormality. 2. No acute displaced fracture or traumatic listhesis of the cervical spine. Electronically Signed   By: Tish Frederickson M.D.   On: 07/14/2022 00:23     Subjective: No acute issues/events overnight   Discharge Exam: Vitals:   07/18/22 2247 07/19/22 0531  BP: (!) 141/68 135/65  Pulse: 73 80  Resp: 18 16  Temp: 98.8 F (37.1 C) (!) 97.4 F (36.3 C)  SpO2: 97% 97%   Vitals:   07/18/22 0508 07/18/22 1412 07/18/22 2247 07/19/22 0531  BP: (!) 146/67 127/63 (!) 141/68 135/65  Pulse: 71 73 73 80  Resp: 13 16 18 16   Temp: 97.6 F (36.4 C) 98.1 F (36.7 C) 98.8 F (37.1 C) (!) 97.4 F (36.3 C)  TempSrc: Axillary     SpO2: 99% 97% 97% 97%  Weight:      Height:        General: Pt is alert, awake, not in acute distress Cardiovascular: RRR, S1/S2 +, no rubs, no gallops Respiratory: CTA bilaterally, no wheezing, no rhonchi Abdominal: Soft,  NT, ND, bowel sounds + Extremities: no edema, no cyanosis    The results of significant diagnostics from this hospitalization (including imaging, microbiology, ancillary and laboratory) are listed below for reference.     Microbiology: Recent Results (from the past 240 hour(s))  Urine Culture     Status: Abnormal   Collection Time: 07/14/22  1:22 AM   Specimen: Urine, Clean Catch  Result Value Ref Range Status   Specimen Description   Final     URINE, CLEAN CATCH Performed at [facility], [address], [place], [place] [address]    Special Requests   Final    NONE Performed at [facility], [address], [place], [place] [address]    Culture (A)  Final    >=100,000 COLONIES/mL KLEBSIELLA PNEUMONIAE Two isolates with different morphologies were identified as the same organism.The most resistant organism was reported. Performed at [facility], [address] [place], [place] [address]    Report Status 07/18/2022 FINAL  Final   Organism ID, Bacteria KLEBSIELLA PNEUMONIAE (A)  Final      Susceptibility   Klebsiella pneumoniae - MIC*    AMPICILLIN >=32 RESISTANT Resistant     CEFAZOLIN <=4 SENSITIVE Sensitive     CEFEPIME <=0.12 SENSITIVE Sensitive     CEFTRIAXONE <=0.25 SENSITIVE Sensitive     CIPROFLOXACIN <=0.25 SENSITIVE Sensitive     GENTAMICIN <=1 SENSITIVE Sensitive     IMIPENEM 2 SENSITIVE Sensitive     NITROFURANTOIN 128 RESISTANT Resistant     TRIMETH/SULFA <=20 SENSITIVE Sensitive     AMPICILLIN/SULBACTAM 8 SENSITIVE Sensitive     PIP/TAZO 8 SENSITIVE Sensitive     * >=100,000 COLONIES/mL KLEBSIELLA PNEUMONIAE  MRSA Next Gen by PCR, Nasal     Status: None   Collection Time: 07/14/22  6:16 AM   Specimen: Nasal Mucosa; Nasal Swab  Result Value Ref Range Status   MRSA by PCR Next Gen NOT DETECTED NOT DETECTED Final    Comment: (NOTE) The GeneXpert MRSA Assay (FDA approved for NASAL specimens only), is one component of a comprehensive MRSA colonization surveillance program. It is not intended to diagnose MRSA infection nor to guide or monitor treatment for MRSA infections. Test performance is not FDA approved in patients less than 63 years old. Performed at [facility], [address], [place], [place] [address]      Labs: BNP (last 3 results) No results for input(s): "BNP" in the last 8760 hours. Basic Metabolic Panel: Recent Labs   Lab 07/14/22 0122 07/14/22 0758  NA 138 134*  K 3.5 3.0*  CL 102 100  CO2 25 25  GLUCOSE 134* 126*  BUN 17 14  CREATININE 0.56 0.53  CALCIUM 9.2 8.9  MG  --  1.9    Liver Function Tests: No results for input(s): "AST", "ALT", "ALKPHOS", "BILITOT", "PROT", "ALBUMIN" in the last 168 hours. No results for input(s): "LIPASE", "AMYLASE" in the last 168 hours. No results for input(s): "AMMONIA" in the last 168 hours. CBC: Recent Labs  Lab 07/14/22 0122 07/14/22 0758 07/14/22 1103  WBC 6.5 12.8* 15.1*  NEUTROABS 5.3  --   --   HGB 6.5* 12.1 13.7  HCT 19.9* 35.3* 43.2  MCV 99.0 95.9 101.4*  PLT 112* 216 181    Cardiac Enzymes: No results for input(s): "CKTOTAL", "CKMB", "CKMBINDEX", "TROPONINI" in the last 168 hours. BNP: Invalid input(s): "POCBNP" CBG: No results for input(s): "GLUCAP" in the last 168 hours. D-Dimer No results  for input(s): "DDIMER" in the last 72 hours. Hgb A1c No results for input(s): "HGBA1C" in the last 72 hours. Lipid Profile No results for input(s): "CHOL", "HDL", "LDLCALC", "TRIG", "CHOLHDL", "LDLDIRECT" in the last 72 hours. Thyroid function studies No results for input(s): "TSH", "T4TOTAL", "T3FREE", "THYROIDAB" in the last 72 hours.  Invalid input(s): "FREET3" Anemia work up No results for input(s): "VITAMINB12", "FOLATE", "FERRITIN", "TIBC", "IRON", "RETICCTPCT" in the last 72 hours. Urinalysis    Component Value Date/Time   COLORURINE YELLOW 07/14/2022 0122   APPEARANCEUR CLEAR 07/14/2022 0122   LABSPEC 1.010 07/14/2022 0122   PHURINE 7.0 07/14/2022 0122   GLUCOSEU NEGATIVE 07/14/2022 0122   GLUCOSEU NEGATIVE 05/15/2006 1125   HGBUR MODERATE (A) 07/14/2022 0122   BILIRUBINUR NEGATIVE 07/14/2022 0122   BILIRUBINUR Positive 01/10/2020 1437   KETONESUR NEGATIVE 07/14/2022 0122   PROTEINUR NEGATIVE 07/14/2022 0122   UROBILINOGEN 1.0 01/10/2020 1437   UROBILINOGEN 0.2 mg/dL 30/86/5784 6962   NITRITE POSITIVE (A) 07/14/2022 0122    LEUKOCYTESUR LARGE (A) 07/14/2022 0122   Sepsis Labs Recent Labs  Lab 07/14/22 0122 07/14/22 0758 07/14/22 1103  WBC 6.5 12.8* 15.1*    Microbiology Recent Results (from the past 240 hour(s))  Urine Culture     Status: Abnormal   Collection Time: 07/14/22  1:22 AM   Specimen: Urine, Clean Catch  Result Value Ref Range Status   Specimen Description   Final    URINE, CLEAN CATCH Performed at [facility], [address], [place], [place] [address]    Special Requests   Final    NONE Performed at [facility], [address], [place], [place] [address]    Culture (A)  Final    >=100,000 COLONIES/mL KLEBSIELLA PNEUMONIAE Two isolates with different morphologies were identified as the same organism.The most resistant organism was reported. Performed at [facility], [address] [place], [place] [address]    Report Status 07/18/2022 FINAL  Final   Organism ID, Bacteria KLEBSIELLA PNEUMONIAE (A)  Final      Susceptibility   Klebsiella pneumoniae - MIC*    AMPICILLIN >=32 RESISTANT Resistant     CEFAZOLIN <=4 SENSITIVE Sensitive     CEFEPIME <=0.12 SENSITIVE Sensitive     CEFTRIAXONE <=0.25 SENSITIVE Sensitive     CIPROFLOXACIN <=0.25 SENSITIVE Sensitive     GENTAMICIN <=1 SENSITIVE Sensitive     IMIPENEM 2 SENSITIVE Sensitive     NITROFURANTOIN 128 RESISTANT Resistant     TRIMETH/SULFA <=20 SENSITIVE Sensitive     AMPICILLIN/SULBACTAM 8 SENSITIVE Sensitive     PIP/TAZO 8 SENSITIVE Sensitive     * >=100,000 COLONIES/mL KLEBSIELLA PNEUMONIAE  MRSA Next Gen by PCR, Nasal     Status: None   Collection Time: 07/14/22  6:16 AM   Specimen: Nasal Mucosa; Nasal Swab  Result Value Ref Range Status   MRSA by PCR Next Gen NOT DETECTED NOT DETECTED Final    Comment: (NOTE) The GeneXpert MRSA Assay (FDA approved for NASAL specimens only), is one component of a comprehensive MRSA colonization surveillance program. It is not intended to  diagnose MRSA infection nor to guide or monitor treatment for MRSA infections. Test performance is not FDA approved in patients less than 46 years old. Performed at [facility], [address] [place], [place] [address]      Time coordinating discharge: Over 30 minutes  SIGNED:   Azucena Fallen, DO [facility] 07/19/2022, 11:58 AM Pager   If 7PM-7AM, please  contact night-coverage [email or web address]

## 2022-07-19 NOTE — Progress Notes (Signed)
   Palliative Medicine Inpatient Follow Up Note HPI: 89 year old woman presented from memory care unit at [facility] after mechanical fall. Imaging revealed compression deformities T12 and L3, was admitted for inability to bear weight and pain control. Condition gradually improved, and patient was able to walk 5/28 with therapy; unfortunately pain continues to be extremely poorly controlled today requiring increased regimen.  Patient essentially bedbound currently in moderate distress -complicated by dementia.    Palliative care has been asked to get involved to further discuss symptom management and goals of care.  Today's Discussion 07/19/2022  *Please note that this is a verbal dictation therefore any spelling or grammatical errors are due to the "[employer]agon [employer] One" system interpretation.  Chart reviewed inclusive of vital signs, progress notes, laboratory results, and diagnostic images.   I met with [name], [name] who shares that patient received a catheter. The plan will be OP follow up with urology.   I met with [name] at bedside, she is pleasant and focused on eating her bacon. She is very hard of hearing though I am able to elicit from her this morning that she is not having notable pain.   There is no family present at bedside this morning.  Per chart review plan will be for transition to [facility] this morning.   Questions and concerns addressed/Palliative Support Provided.   Objective Assessment: Vital Signs Vitals:   07/18/22 2247 07/19/22 0531  BP: (!) 141/68 135/65  Pulse: 73 80  Resp: 18 16  Temp: 98.8 F (37.1 C) (!) 97.4 F (36.3 C)  SpO2: 97% 97%    Intake/Output Summary (Last 24 hours) at 07/19/2022 0932 Last data filed at 07/19/2022 4098 Gross per 24 hour  Intake 1120 ml  Output 1600 ml  Net -480 ml   Last Weight  Most recent update: 07/14/2022  7:38 AM    Weight  52 kg (114 lb 10.2 oz)            Gen: Elderly Caucasian female in no  acute distress HEENT: moist mucous membranes CV: Regular rate and rhythm PULM: On room air breathing appears even and nonlabored ABD: soft/nontender EXT: No edema Neuro: Alert and oriented to person - very hard of hearing  SUMMARY OF RECOMMENDATIONS   DNAR/DNI   Introduced a MOST form and recommended family review and complete this   Goals at this time are for [name] to go to rehabilitation to gain strength    TOC - OP Palliative support on discharge   Code Status/Advance Care Planning: DNAR/DNI   Symptom Management:  Acute on chronic lower back pain -Continue Robaxin 500 mg every 8 hours as needed -Continue Norco 1-2 tabs every 4 hours as needed -Premedicate 30 minutes before activities  Billing based on MDM: Moderate ______________________________________________________________________________________ [name] Palliative Medicine Team Team Cell Phone: [phone] Please utilize secure chat with additional questions, if there is no response within 30 minutes please call the above phone number  Palliative Medicine Team providers are available by phone from 7am to 7pm daily and can be reached through the team cell phone.  Should this patient require assistance outside of these hours, please call the patient's attending physician.

## 2022-07-19 NOTE — TOC Transition Note (Addendum)
Transition of Care [facility]) - CM/SW Discharge Note   Patient Details  Name: [name] MRN: [id number] Date of Birth: [birth year]  Transition of Care [place]) CM/SW Contact:  Amada Jupiter, LCSW Phone Number: 07/19/2022, 12:19 PM   Clinical Narrative:     Pt medically cleared for dc to [facility] today.  PTAR called at 12:20pm.  RN to call report to [phone].   Discussion with daughters about possible OP Palliative referral, however, they prefer to hold on this for now.     Final next level of care: Skilled Nursing Facility Barriers to Discharge: Barriers Resolved   Patient Goals and CMS Choice CMS Medicare.gov Compare Post Acute Care list provided to:: Patient Represenative (must comment) Choice offered to / list presented to : Adult Children  Discharge Placement                Patient chooses bed at: [facility] Patient to be transferred to facility by: PTAR Name of family member notified: daughters Patient and family notified of of transfer: 07/19/22  Discharge Plan and Services Additional resources added to the After Visit Summary for   In-house Referral: Clinical Social Work   Post Acute Care Choice: Skilled Nursing Facility          DME Arranged: N/A DME Agency: NA                  Social Determinants of Health (SDOH) Interventions SDOH Screenings   Food Insecurity: Patient Unable To Answer (07/14/2022)  Housing: High Risk (07/14/2022)  Transportation Needs: Patient Unable To Answer (07/14/2022)  Utilities: Patient Unable To Answer (07/14/2022)  Depression (PHQ2-9): Low Risk  (03/22/2020)  Tobacco Use: Low Risk  (07/13/2022)     Readmission Risk Interventions     No data to display

## 2022-07-22 DIAGNOSIS — I129 Hypertensive chronic kidney disease with stage 1 through stage 4 chronic kidney disease, or unspecified chronic kidney disease: Secondary | ICD-10-CM | POA: Diagnosis not present

## 2022-07-22 DIAGNOSIS — M1990 Unspecified osteoarthritis, unspecified site: Secondary | ICD-10-CM | POA: Diagnosis not present

## 2022-07-22 DIAGNOSIS — F0283 Dementia in other diseases classified elsewhere, unspecified severity, with mood disturbance: Secondary | ICD-10-CM | POA: Diagnosis not present

## 2022-07-22 DIAGNOSIS — N139 Obstructive and reflux uropathy, unspecified: Secondary | ICD-10-CM | POA: Diagnosis not present

## 2022-07-22 DIAGNOSIS — M810 Age-related osteoporosis without current pathological fracture: Secondary | ICD-10-CM | POA: Diagnosis not present

## 2022-07-22 DIAGNOSIS — F32A Depression, unspecified: Secondary | ICD-10-CM | POA: Diagnosis not present

## 2022-07-22 DIAGNOSIS — I4891 Unspecified atrial fibrillation: Secondary | ICD-10-CM | POA: Diagnosis not present

## 2022-07-22 DIAGNOSIS — I872 Venous insufficiency (chronic) (peripheral): Secondary | ICD-10-CM | POA: Diagnosis not present

## 2022-07-22 DIAGNOSIS — M4850XD Collapsed vertebra, not elsewhere classified, site unspecified, subsequent encounter for fracture with routine healing: Secondary | ICD-10-CM | POA: Diagnosis not present

## 2022-07-22 DIAGNOSIS — E559 Vitamin D deficiency, unspecified: Secondary | ICD-10-CM | POA: Diagnosis not present

## 2022-07-22 DIAGNOSIS — N183 Chronic kidney disease, stage 3 unspecified: Secondary | ICD-10-CM | POA: Diagnosis not present

## 2022-07-22 DIAGNOSIS — E538 Deficiency of other specified B group vitamins: Secondary | ICD-10-CM | POA: Diagnosis not present

## 2022-07-23 DIAGNOSIS — E559 Vitamin D deficiency, unspecified: Secondary | ICD-10-CM | POA: Diagnosis not present

## 2022-07-29 DIAGNOSIS — R339 Retention of urine, unspecified: Secondary | ICD-10-CM | POA: Diagnosis not present

## 2022-07-29 DIAGNOSIS — M4850XD Collapsed vertebra, not elsewhere classified, site unspecified, subsequent encounter for fracture with routine healing: Secondary | ICD-10-CM | POA: Diagnosis not present

## 2022-08-05 DIAGNOSIS — N390 Urinary tract infection, site not specified: Secondary | ICD-10-CM | POA: Diagnosis not present

## 2022-08-05 DIAGNOSIS — R040 Epistaxis: Secondary | ICD-10-CM | POA: Diagnosis not present

## 2022-08-05 DIAGNOSIS — S22080S Wedge compression fracture of T11-T12 vertebra, sequela: Secondary | ICD-10-CM | POA: Diagnosis not present

## 2022-08-05 DIAGNOSIS — S32030S Wedge compression fracture of third lumbar vertebra, sequela: Secondary | ICD-10-CM | POA: Diagnosis not present

## 2022-08-05 DIAGNOSIS — R296 Repeated falls: Secondary | ICD-10-CM | POA: Diagnosis not present

## 2022-08-05 DIAGNOSIS — M1991 Primary osteoarthritis, unspecified site: Secondary | ICD-10-CM | POA: Diagnosis not present

## 2022-08-05 DIAGNOSIS — R1314 Dysphagia, pharyngoesophageal phase: Secondary | ICD-10-CM | POA: Diagnosis not present

## 2022-08-05 DIAGNOSIS — M545 Low back pain, unspecified: Secondary | ICD-10-CM | POA: Diagnosis not present

## 2022-08-05 DIAGNOSIS — M542 Cervicalgia: Secondary | ICD-10-CM | POA: Diagnosis not present

## 2022-08-09 DIAGNOSIS — M5451 Vertebrogenic low back pain: Secondary | ICD-10-CM | POA: Diagnosis not present

## 2022-08-09 DIAGNOSIS — M4856XA Collapsed vertebra, not elsewhere classified, lumbar region, initial encounter for fracture: Secondary | ICD-10-CM | POA: Diagnosis not present

## 2022-08-17 DIAGNOSIS — N390 Urinary tract infection, site not specified: Secondary | ICD-10-CM | POA: Diagnosis not present

## 2022-08-22 DIAGNOSIS — I6782 Cerebral ischemia: Secondary | ICD-10-CM | POA: Diagnosis not present

## 2022-08-22 DIAGNOSIS — S0003XA Contusion of scalp, initial encounter: Secondary | ICD-10-CM | POA: Diagnosis not present

## 2022-08-22 DIAGNOSIS — M47816 Spondylosis without myelopathy or radiculopathy, lumbar region: Secondary | ICD-10-CM | POA: Diagnosis not present

## 2022-08-22 DIAGNOSIS — Z743 Need for continuous supervision: Secondary | ICD-10-CM | POA: Diagnosis not present

## 2022-08-22 DIAGNOSIS — M47814 Spondylosis without myelopathy or radiculopathy, thoracic region: Secondary | ICD-10-CM | POA: Diagnosis not present

## 2022-08-22 DIAGNOSIS — R58 Hemorrhage, not elsewhere classified: Secondary | ICD-10-CM | POA: Diagnosis not present

## 2022-08-22 DIAGNOSIS — M4854XA Collapsed vertebra, not elsewhere classified, thoracic region, initial encounter for fracture: Secondary | ICD-10-CM | POA: Diagnosis not present

## 2022-08-22 DIAGNOSIS — S22088D Other fracture of T11-T12 vertebra, subsequent encounter for fracture with routine healing: Secondary | ICD-10-CM | POA: Diagnosis not present

## 2022-08-22 DIAGNOSIS — R404 Transient alteration of awareness: Secondary | ICD-10-CM | POA: Diagnosis not present

## 2022-08-22 DIAGNOSIS — I700 Atherosclerosis of aorta: Secondary | ICD-10-CM | POA: Diagnosis not present

## 2022-08-22 DIAGNOSIS — Z993 Dependence on wheelchair: Secondary | ICD-10-CM | POA: Diagnosis not present

## 2022-08-22 DIAGNOSIS — I6523 Occlusion and stenosis of bilateral carotid arteries: Secondary | ICD-10-CM | POA: Diagnosis not present

## 2022-08-22 DIAGNOSIS — S32038D Other fracture of third lumbar vertebra, subsequent encounter for fracture with routine healing: Secondary | ICD-10-CM | POA: Diagnosis not present

## 2022-08-22 DIAGNOSIS — Z23 Encounter for immunization: Secondary | ICD-10-CM | POA: Diagnosis not present

## 2022-08-22 DIAGNOSIS — Z8781 Personal history of (healed) traumatic fracture: Secondary | ICD-10-CM | POA: Diagnosis not present

## 2022-08-22 DIAGNOSIS — S0001XA Abrasion of scalp, initial encounter: Secondary | ICD-10-CM | POA: Diagnosis not present

## 2022-08-22 DIAGNOSIS — M549 Dorsalgia, unspecified: Secondary | ICD-10-CM | POA: Diagnosis not present

## 2022-08-22 DIAGNOSIS — Z7401 Bed confinement status: Secondary | ICD-10-CM | POA: Diagnosis not present

## 2022-08-22 DIAGNOSIS — S32028D Other fracture of second lumbar vertebra, subsequent encounter for fracture with routine healing: Secondary | ICD-10-CM | POA: Diagnosis not present

## 2022-08-22 DIAGNOSIS — Z043 Encounter for examination and observation following other accident: Secondary | ICD-10-CM | POA: Diagnosis not present

## 2022-08-22 DIAGNOSIS — I672 Cerebral atherosclerosis: Secondary | ICD-10-CM | POA: Diagnosis not present

## 2022-08-22 DIAGNOSIS — M4856XA Collapsed vertebra, not elsewhere classified, lumbar region, initial encounter for fracture: Secondary | ICD-10-CM | POA: Diagnosis not present

## 2022-08-23 DIAGNOSIS — Z743 Need for continuous supervision: Secondary | ICD-10-CM | POA: Diagnosis not present

## 2022-08-23 DIAGNOSIS — M4856XA Collapsed vertebra, not elsewhere classified, lumbar region, initial encounter for fracture: Secondary | ICD-10-CM | POA: Diagnosis not present

## 2022-08-23 DIAGNOSIS — M4854XA Collapsed vertebra, not elsewhere classified, thoracic region, initial encounter for fracture: Secondary | ICD-10-CM | POA: Diagnosis not present

## 2022-08-23 DIAGNOSIS — I700 Atherosclerosis of aorta: Secondary | ICD-10-CM | POA: Diagnosis not present

## 2022-08-23 DIAGNOSIS — M47816 Spondylosis without myelopathy or radiculopathy, lumbar region: Secondary | ICD-10-CM | POA: Diagnosis not present

## 2022-08-23 DIAGNOSIS — M549 Dorsalgia, unspecified: Secondary | ICD-10-CM | POA: Diagnosis not present

## 2022-08-23 DIAGNOSIS — M47814 Spondylosis without myelopathy or radiculopathy, thoracic region: Secondary | ICD-10-CM | POA: Diagnosis not present

## 2022-08-23 DIAGNOSIS — R531 Weakness: Secondary | ICD-10-CM | POA: Diagnosis not present

## 2022-08-26 DIAGNOSIS — G4700 Insomnia, unspecified: Secondary | ICD-10-CM | POA: Diagnosis not present

## 2022-08-26 DIAGNOSIS — N189 Chronic kidney disease, unspecified: Secondary | ICD-10-CM | POA: Diagnosis not present

## 2022-08-26 DIAGNOSIS — F32A Depression, unspecified: Secondary | ICD-10-CM | POA: Diagnosis not present

## 2022-08-26 DIAGNOSIS — M810 Age-related osteoporosis without current pathological fracture: Secondary | ICD-10-CM | POA: Diagnosis not present

## 2022-08-26 DIAGNOSIS — I129 Hypertensive chronic kidney disease with stage 1 through stage 4 chronic kidney disease, or unspecified chronic kidney disease: Secondary | ICD-10-CM | POA: Diagnosis not present

## 2022-08-26 DIAGNOSIS — M4850XD Collapsed vertebra, not elsewhere classified, site unspecified, subsequent encounter for fracture with routine healing: Secondary | ICD-10-CM | POA: Diagnosis not present

## 2022-09-03 DIAGNOSIS — I10 Essential (primary) hypertension: Secondary | ICD-10-CM | POA: Diagnosis not present

## 2022-09-05 DIAGNOSIS — F0393 Unspecified dementia, unspecified severity, with mood disturbance: Secondary | ICD-10-CM | POA: Diagnosis not present

## 2022-09-05 DIAGNOSIS — M810 Age-related osteoporosis without current pathological fracture: Secondary | ICD-10-CM | POA: Diagnosis not present

## 2022-09-05 DIAGNOSIS — M4855XD Collapsed vertebra, not elsewhere classified, thoracolumbar region, subsequent encounter for fracture with routine healing: Secondary | ICD-10-CM | POA: Diagnosis not present

## 2022-09-11 DIAGNOSIS — Z9181 History of falling: Secondary | ICD-10-CM | POA: Diagnosis not present

## 2022-09-11 DIAGNOSIS — R293 Abnormal posture: Secondary | ICD-10-CM | POA: Diagnosis not present

## 2022-09-11 DIAGNOSIS — R1314 Dysphagia, pharyngoesophageal phase: Secondary | ICD-10-CM | POA: Diagnosis not present

## 2022-09-11 DIAGNOSIS — M6281 Muscle weakness (generalized): Secondary | ICD-10-CM | POA: Diagnosis not present

## 2022-09-11 DIAGNOSIS — S32030S Wedge compression fracture of third lumbar vertebra, sequela: Secondary | ICD-10-CM | POA: Diagnosis not present

## 2022-09-11 DIAGNOSIS — N390 Urinary tract infection, site not specified: Secondary | ICD-10-CM | POA: Diagnosis not present

## 2022-09-11 DIAGNOSIS — R262 Difficulty in walking, not elsewhere classified: Secondary | ICD-10-CM | POA: Diagnosis not present

## 2022-09-11 DIAGNOSIS — M542 Cervicalgia: Secondary | ICD-10-CM | POA: Diagnosis not present

## 2022-09-11 DIAGNOSIS — R296 Repeated falls: Secondary | ICD-10-CM | POA: Diagnosis not present

## 2022-09-11 DIAGNOSIS — S22080S Wedge compression fracture of T11-T12 vertebra, sequela: Secondary | ICD-10-CM | POA: Diagnosis not present

## 2022-09-11 DIAGNOSIS — M545 Low back pain, unspecified: Secondary | ICD-10-CM | POA: Diagnosis not present

## 2022-09-11 DIAGNOSIS — M1991 Primary osteoarthritis, unspecified site: Secondary | ICD-10-CM | POA: Diagnosis not present

## 2022-09-11 DIAGNOSIS — R040 Epistaxis: Secondary | ICD-10-CM | POA: Diagnosis not present

## 2022-09-12 DIAGNOSIS — R293 Abnormal posture: Secondary | ICD-10-CM | POA: Diagnosis not present

## 2022-09-12 DIAGNOSIS — M542 Cervicalgia: Secondary | ICD-10-CM | POA: Diagnosis not present

## 2022-09-12 DIAGNOSIS — R1314 Dysphagia, pharyngoesophageal phase: Secondary | ICD-10-CM | POA: Diagnosis not present

## 2022-09-12 DIAGNOSIS — S32030S Wedge compression fracture of third lumbar vertebra, sequela: Secondary | ICD-10-CM | POA: Diagnosis not present

## 2022-09-12 DIAGNOSIS — R040 Epistaxis: Secondary | ICD-10-CM | POA: Diagnosis not present

## 2022-09-12 DIAGNOSIS — R296 Repeated falls: Secondary | ICD-10-CM | POA: Diagnosis not present

## 2022-09-12 DIAGNOSIS — M545 Low back pain, unspecified: Secondary | ICD-10-CM | POA: Diagnosis not present

## 2022-09-12 DIAGNOSIS — M1991 Primary osteoarthritis, unspecified site: Secondary | ICD-10-CM | POA: Diagnosis not present

## 2022-09-12 DIAGNOSIS — M5451 Vertebrogenic low back pain: Secondary | ICD-10-CM | POA: Diagnosis not present

## 2022-09-12 DIAGNOSIS — M6281 Muscle weakness (generalized): Secondary | ICD-10-CM | POA: Diagnosis not present

## 2022-09-12 DIAGNOSIS — N390 Urinary tract infection, site not specified: Secondary | ICD-10-CM | POA: Diagnosis not present

## 2022-09-12 DIAGNOSIS — S22080S Wedge compression fracture of T11-T12 vertebra, sequela: Secondary | ICD-10-CM | POA: Diagnosis not present

## 2022-09-12 DIAGNOSIS — R262 Difficulty in walking, not elsewhere classified: Secondary | ICD-10-CM | POA: Diagnosis not present

## 2022-09-12 DIAGNOSIS — Z9181 History of falling: Secondary | ICD-10-CM | POA: Diagnosis not present

## 2022-09-13 DIAGNOSIS — M1991 Primary osteoarthritis, unspecified site: Secondary | ICD-10-CM | POA: Diagnosis not present

## 2022-09-13 DIAGNOSIS — S22080S Wedge compression fracture of T11-T12 vertebra, sequela: Secondary | ICD-10-CM | POA: Diagnosis not present

## 2022-09-13 DIAGNOSIS — Z9181 History of falling: Secondary | ICD-10-CM | POA: Diagnosis not present

## 2022-09-13 DIAGNOSIS — R293 Abnormal posture: Secondary | ICD-10-CM | POA: Diagnosis not present

## 2022-09-13 DIAGNOSIS — I129 Hypertensive chronic kidney disease with stage 1 through stage 4 chronic kidney disease, or unspecified chronic kidney disease: Secondary | ICD-10-CM | POA: Diagnosis not present

## 2022-09-13 DIAGNOSIS — R296 Repeated falls: Secondary | ICD-10-CM | POA: Diagnosis not present

## 2022-09-13 DIAGNOSIS — R262 Difficulty in walking, not elsewhere classified: Secondary | ICD-10-CM | POA: Diagnosis not present

## 2022-09-13 DIAGNOSIS — N390 Urinary tract infection, site not specified: Secondary | ICD-10-CM | POA: Diagnosis not present

## 2022-09-13 DIAGNOSIS — M545 Low back pain, unspecified: Secondary | ICD-10-CM | POA: Diagnosis not present

## 2022-09-13 DIAGNOSIS — M6281 Muscle weakness (generalized): Secondary | ICD-10-CM | POA: Diagnosis not present

## 2022-09-13 DIAGNOSIS — F32A Depression, unspecified: Secondary | ICD-10-CM | POA: Diagnosis not present

## 2022-09-13 DIAGNOSIS — N189 Chronic kidney disease, unspecified: Secondary | ICD-10-CM | POA: Diagnosis not present

## 2022-09-13 DIAGNOSIS — R040 Epistaxis: Secondary | ICD-10-CM | POA: Diagnosis not present

## 2022-09-13 DIAGNOSIS — M542 Cervicalgia: Secondary | ICD-10-CM | POA: Diagnosis not present

## 2022-09-13 DIAGNOSIS — F03918 Unspecified dementia, unspecified severity, with other behavioral disturbance: Secondary | ICD-10-CM | POA: Diagnosis not present

## 2022-09-13 DIAGNOSIS — S32030S Wedge compression fracture of third lumbar vertebra, sequela: Secondary | ICD-10-CM | POA: Diagnosis not present

## 2022-09-13 DIAGNOSIS — F0393 Unspecified dementia, unspecified severity, with mood disturbance: Secondary | ICD-10-CM | POA: Diagnosis not present

## 2022-09-13 DIAGNOSIS — G4700 Insomnia, unspecified: Secondary | ICD-10-CM | POA: Diagnosis not present

## 2022-09-13 DIAGNOSIS — R1314 Dysphagia, pharyngoesophageal phase: Secondary | ICD-10-CM | POA: Diagnosis not present

## 2022-09-14 DIAGNOSIS — M1991 Primary osteoarthritis, unspecified site: Secondary | ICD-10-CM | POA: Diagnosis not present

## 2022-09-14 DIAGNOSIS — S32030S Wedge compression fracture of third lumbar vertebra, sequela: Secondary | ICD-10-CM | POA: Diagnosis not present

## 2022-09-14 DIAGNOSIS — R262 Difficulty in walking, not elsewhere classified: Secondary | ICD-10-CM | POA: Diagnosis not present

## 2022-09-14 DIAGNOSIS — M545 Low back pain, unspecified: Secondary | ICD-10-CM | POA: Diagnosis not present

## 2022-09-14 DIAGNOSIS — S22080S Wedge compression fracture of T11-T12 vertebra, sequela: Secondary | ICD-10-CM | POA: Diagnosis not present

## 2022-09-14 DIAGNOSIS — R293 Abnormal posture: Secondary | ICD-10-CM | POA: Diagnosis not present

## 2022-09-14 DIAGNOSIS — M6281 Muscle weakness (generalized): Secondary | ICD-10-CM | POA: Diagnosis not present

## 2022-09-14 DIAGNOSIS — M542 Cervicalgia: Secondary | ICD-10-CM | POA: Diagnosis not present

## 2022-09-14 DIAGNOSIS — R1314 Dysphagia, pharyngoesophageal phase: Secondary | ICD-10-CM | POA: Diagnosis not present

## 2022-09-14 DIAGNOSIS — R040 Epistaxis: Secondary | ICD-10-CM | POA: Diagnosis not present

## 2022-09-14 DIAGNOSIS — Z9181 History of falling: Secondary | ICD-10-CM | POA: Diagnosis not present

## 2022-09-14 DIAGNOSIS — N390 Urinary tract infection, site not specified: Secondary | ICD-10-CM | POA: Diagnosis not present

## 2022-09-14 DIAGNOSIS — R296 Repeated falls: Secondary | ICD-10-CM | POA: Diagnosis not present

## 2022-09-16 DIAGNOSIS — Z9181 History of falling: Secondary | ICD-10-CM | POA: Diagnosis not present

## 2022-09-16 DIAGNOSIS — R262 Difficulty in walking, not elsewhere classified: Secondary | ICD-10-CM | POA: Diagnosis not present

## 2022-09-16 DIAGNOSIS — S22080S Wedge compression fracture of T11-T12 vertebra, sequela: Secondary | ICD-10-CM | POA: Diagnosis not present

## 2022-09-16 DIAGNOSIS — M545 Low back pain, unspecified: Secondary | ICD-10-CM | POA: Diagnosis not present

## 2022-09-16 DIAGNOSIS — N390 Urinary tract infection, site not specified: Secondary | ICD-10-CM | POA: Diagnosis not present

## 2022-09-16 DIAGNOSIS — R293 Abnormal posture: Secondary | ICD-10-CM | POA: Diagnosis not present

## 2022-09-16 DIAGNOSIS — M1991 Primary osteoarthritis, unspecified site: Secondary | ICD-10-CM | POA: Diagnosis not present

## 2022-09-16 DIAGNOSIS — M542 Cervicalgia: Secondary | ICD-10-CM | POA: Diagnosis not present

## 2022-09-16 DIAGNOSIS — M6281 Muscle weakness (generalized): Secondary | ICD-10-CM | POA: Diagnosis not present

## 2022-09-16 DIAGNOSIS — R040 Epistaxis: Secondary | ICD-10-CM | POA: Diagnosis not present

## 2022-09-16 DIAGNOSIS — R1314 Dysphagia, pharyngoesophageal phase: Secondary | ICD-10-CM | POA: Diagnosis not present

## 2022-09-16 DIAGNOSIS — R296 Repeated falls: Secondary | ICD-10-CM | POA: Diagnosis not present

## 2022-09-16 DIAGNOSIS — S32030S Wedge compression fracture of third lumbar vertebra, sequela: Secondary | ICD-10-CM | POA: Diagnosis not present

## 2022-09-17 DIAGNOSIS — M6281 Muscle weakness (generalized): Secondary | ICD-10-CM | POA: Diagnosis not present

## 2022-09-17 DIAGNOSIS — R262 Difficulty in walking, not elsewhere classified: Secondary | ICD-10-CM | POA: Diagnosis not present

## 2022-09-17 DIAGNOSIS — S22080S Wedge compression fracture of T11-T12 vertebra, sequela: Secondary | ICD-10-CM | POA: Diagnosis not present

## 2022-09-17 DIAGNOSIS — R1314 Dysphagia, pharyngoesophageal phase: Secondary | ICD-10-CM | POA: Diagnosis not present

## 2022-09-17 DIAGNOSIS — R296 Repeated falls: Secondary | ICD-10-CM | POA: Diagnosis not present

## 2022-09-17 DIAGNOSIS — S32030S Wedge compression fracture of third lumbar vertebra, sequela: Secondary | ICD-10-CM | POA: Diagnosis not present

## 2022-09-17 DIAGNOSIS — N390 Urinary tract infection, site not specified: Secondary | ICD-10-CM | POA: Diagnosis not present

## 2022-09-17 DIAGNOSIS — M545 Low back pain, unspecified: Secondary | ICD-10-CM | POA: Diagnosis not present

## 2022-09-17 DIAGNOSIS — Z9181 History of falling: Secondary | ICD-10-CM | POA: Diagnosis not present

## 2022-09-17 DIAGNOSIS — R040 Epistaxis: Secondary | ICD-10-CM | POA: Diagnosis not present

## 2022-09-17 DIAGNOSIS — R293 Abnormal posture: Secondary | ICD-10-CM | POA: Diagnosis not present

## 2022-09-17 DIAGNOSIS — M1991 Primary osteoarthritis, unspecified site: Secondary | ICD-10-CM | POA: Diagnosis not present

## 2022-09-17 DIAGNOSIS — M542 Cervicalgia: Secondary | ICD-10-CM | POA: Diagnosis not present

## 2022-09-18 DIAGNOSIS — N390 Urinary tract infection, site not specified: Secondary | ICD-10-CM | POA: Diagnosis not present

## 2022-09-18 DIAGNOSIS — M6281 Muscle weakness (generalized): Secondary | ICD-10-CM | POA: Diagnosis not present

## 2022-09-18 DIAGNOSIS — M545 Low back pain, unspecified: Secondary | ICD-10-CM | POA: Diagnosis not present

## 2022-09-18 DIAGNOSIS — R296 Repeated falls: Secondary | ICD-10-CM | POA: Diagnosis not present

## 2022-09-18 DIAGNOSIS — R262 Difficulty in walking, not elsewhere classified: Secondary | ICD-10-CM | POA: Diagnosis not present

## 2022-09-18 DIAGNOSIS — R1314 Dysphagia, pharyngoesophageal phase: Secondary | ICD-10-CM | POA: Diagnosis not present

## 2022-09-18 DIAGNOSIS — R040 Epistaxis: Secondary | ICD-10-CM | POA: Diagnosis not present

## 2022-09-18 DIAGNOSIS — S32030S Wedge compression fracture of third lumbar vertebra, sequela: Secondary | ICD-10-CM | POA: Diagnosis not present

## 2022-09-18 DIAGNOSIS — M1991 Primary osteoarthritis, unspecified site: Secondary | ICD-10-CM | POA: Diagnosis not present

## 2022-09-18 DIAGNOSIS — M542 Cervicalgia: Secondary | ICD-10-CM | POA: Diagnosis not present

## 2022-09-18 DIAGNOSIS — Z9181 History of falling: Secondary | ICD-10-CM | POA: Diagnosis not present

## 2022-09-18 DIAGNOSIS — S22080S Wedge compression fracture of T11-T12 vertebra, sequela: Secondary | ICD-10-CM | POA: Diagnosis not present

## 2022-09-18 DIAGNOSIS — R293 Abnormal posture: Secondary | ICD-10-CM | POA: Diagnosis not present

## 2022-09-24 DIAGNOSIS — I10 Essential (primary) hypertension: Secondary | ICD-10-CM | POA: Diagnosis not present

## 2022-09-24 DIAGNOSIS — D509 Iron deficiency anemia, unspecified: Secondary | ICD-10-CM | POA: Diagnosis not present

## 2022-09-27 DIAGNOSIS — N390 Urinary tract infection, site not specified: Secondary | ICD-10-CM | POA: Diagnosis not present

## 2022-09-27 DIAGNOSIS — F0394 Unspecified dementia, unspecified severity, with anxiety: Secondary | ICD-10-CM | POA: Diagnosis not present

## 2022-09-27 DIAGNOSIS — R350 Frequency of micturition: Secondary | ICD-10-CM | POA: Diagnosis not present

## 2022-09-27 DIAGNOSIS — F03911 Unspecified dementia, unspecified severity, with agitation: Secondary | ICD-10-CM | POA: Diagnosis not present

## 2022-09-27 DIAGNOSIS — F0393 Unspecified dementia, unspecified severity, with mood disturbance: Secondary | ICD-10-CM | POA: Diagnosis not present

## 2022-09-30 DIAGNOSIS — N390 Urinary tract infection, site not specified: Secondary | ICD-10-CM | POA: Diagnosis not present

## 2022-10-07 DIAGNOSIS — F03C11 Unspecified dementia, severe, with agitation: Secondary | ICD-10-CM | POA: Diagnosis not present

## 2022-10-07 DIAGNOSIS — F03C4 Unspecified dementia, severe, with anxiety: Secondary | ICD-10-CM | POA: Diagnosis not present

## 2022-10-08 DIAGNOSIS — R945 Abnormal results of liver function studies: Secondary | ICD-10-CM | POA: Diagnosis not present

## 2022-10-14 DIAGNOSIS — G4700 Insomnia, unspecified: Secondary | ICD-10-CM | POA: Diagnosis not present

## 2022-10-14 DIAGNOSIS — S22080D Wedge compression fracture of T11-T12 vertebra, subsequent encounter for fracture with routine healing: Secondary | ICD-10-CM | POA: Diagnosis not present

## 2022-10-14 DIAGNOSIS — Z9181 History of falling: Secondary | ICD-10-CM | POA: Diagnosis not present

## 2022-10-14 DIAGNOSIS — N189 Chronic kidney disease, unspecified: Secondary | ICD-10-CM | POA: Diagnosis not present

## 2022-10-14 DIAGNOSIS — I129 Hypertensive chronic kidney disease with stage 1 through stage 4 chronic kidney disease, or unspecified chronic kidney disease: Secondary | ICD-10-CM | POA: Diagnosis not present

## 2022-10-14 DIAGNOSIS — S32030D Wedge compression fracture of third lumbar vertebra, subsequent encounter for fracture with routine healing: Secondary | ICD-10-CM | POA: Diagnosis not present
